# Patient Record
Sex: Male | Born: 1950 | Race: White | Hispanic: No | Marital: Married | Smoking: Current every day smoker
Health system: Southern US, Community
[De-identification: ages and names within clinical notes are randomized; demographics above are authoritative.]

## PROBLEM LIST (undated history)

## (undated) DIAGNOSIS — E559 Vitamin D deficiency, unspecified: Secondary | ICD-10-CM

## (undated) DIAGNOSIS — J439 Emphysema, unspecified: Secondary | ICD-10-CM

## (undated) DIAGNOSIS — K219 Gastro-esophageal reflux disease without esophagitis: Secondary | ICD-10-CM

## (undated) DIAGNOSIS — R0600 Dyspnea, unspecified: Secondary | ICD-10-CM

## (undated) DIAGNOSIS — K635 Polyp of colon: Secondary | ICD-10-CM

## (undated) DIAGNOSIS — G4733 Obstructive sleep apnea (adult) (pediatric): Secondary | ICD-10-CM

## (undated) DIAGNOSIS — C61 Malignant neoplasm of prostate: Secondary | ICD-10-CM

## (undated) DIAGNOSIS — M1990 Unspecified osteoarthritis, unspecified site: Secondary | ICD-10-CM

## (undated) DIAGNOSIS — E785 Hyperlipidemia, unspecified: Secondary | ICD-10-CM

## (undated) DIAGNOSIS — M109 Gout, unspecified: Secondary | ICD-10-CM

## (undated) DIAGNOSIS — I10 Essential (primary) hypertension: Secondary | ICD-10-CM

## (undated) DIAGNOSIS — IMO0002 Reserved for concepts with insufficient information to code with codable children: Secondary | ICD-10-CM

## (undated) DIAGNOSIS — H269 Unspecified cataract: Secondary | ICD-10-CM

## (undated) DIAGNOSIS — D649 Anemia, unspecified: Secondary | ICD-10-CM

## (undated) DIAGNOSIS — G4730 Sleep apnea, unspecified: Secondary | ICD-10-CM

## (undated) DIAGNOSIS — N529 Male erectile dysfunction, unspecified: Secondary | ICD-10-CM

## (undated) HISTORY — DX: Obstructive sleep apnea (adult) (pediatric): G47.33

## (undated) HISTORY — PX: OTHER SURGICAL HISTORY: SHX169

## (undated) HISTORY — DX: Reserved for concepts with insufficient information to code with codable children: IMO0002

## (undated) HISTORY — DX: Emphysema, unspecified: J43.9

## (undated) HISTORY — DX: Polyp of colon: K63.5

## (undated) HISTORY — PX: SKIN CANCER EXCISION: SHX779

## (undated) HISTORY — DX: Sleep apnea, unspecified: G47.30

## (undated) HISTORY — DX: Vitamin D deficiency, unspecified: E55.9

## (undated) HISTORY — PX: EYE SURGERY: SHX253

## (undated) HISTORY — DX: Essential (primary) hypertension: I10

## (undated) HISTORY — DX: Hyperlipidemia, unspecified: E78.5

## (undated) HISTORY — DX: Male erectile dysfunction, unspecified: N52.9

## (undated) HISTORY — DX: Gout, unspecified: M10.9

## (undated) HISTORY — DX: Malignant neoplasm of prostate: C61

## (undated) HISTORY — DX: Unspecified cataract: H26.9

## (undated) HISTORY — DX: Anemia, unspecified: D64.9

## (undated) HISTORY — DX: Unspecified osteoarthritis, unspecified site: M19.90

---

## 2001-11-29 HISTORY — PX: PROSTATECTOMY: SHX69

## 2001-12-18 ENCOUNTER — Encounter (INDEPENDENT_AMBULATORY_CARE_PROVIDER_SITE_OTHER): Payer: Self-pay

## 2001-12-18 ENCOUNTER — Ambulatory Visit (HOSPITAL_COMMUNITY): Admission: RE | Admit: 2001-12-18 | Discharge: 2001-12-18 | Payer: Self-pay | Admitting: Gastroenterology

## 2002-01-27 DIAGNOSIS — C61 Malignant neoplasm of prostate: Secondary | ICD-10-CM

## 2002-01-27 HISTORY — DX: Malignant neoplasm of prostate: C61

## 2002-02-05 ENCOUNTER — Inpatient Hospital Stay (HOSPITAL_COMMUNITY): Admission: RE | Admit: 2002-02-05 | Discharge: 2002-02-07 | Payer: Self-pay | Admitting: Urology

## 2002-02-05 ENCOUNTER — Encounter (INDEPENDENT_AMBULATORY_CARE_PROVIDER_SITE_OTHER): Payer: Self-pay | Admitting: Specialist

## 2002-03-08 ENCOUNTER — Encounter: Payer: Self-pay | Admitting: Urology

## 2002-03-08 ENCOUNTER — Encounter: Admission: RE | Admit: 2002-03-08 | Discharge: 2002-03-08 | Payer: Self-pay | Admitting: Urology

## 2006-05-31 ENCOUNTER — Encounter: Admission: RE | Admit: 2006-05-31 | Discharge: 2006-05-31 | Payer: Self-pay | Admitting: Internal Medicine

## 2009-05-05 ENCOUNTER — Ambulatory Visit (HOSPITAL_COMMUNITY): Admission: RE | Admit: 2009-05-05 | Discharge: 2009-05-05 | Payer: Self-pay | Admitting: Internal Medicine

## 2009-05-12 ENCOUNTER — Ambulatory Visit (HOSPITAL_COMMUNITY): Admission: RE | Admit: 2009-05-12 | Discharge: 2009-05-12 | Payer: Self-pay | Admitting: Internal Medicine

## 2009-08-05 ENCOUNTER — Ambulatory Visit (HOSPITAL_COMMUNITY): Admission: RE | Admit: 2009-08-05 | Discharge: 2009-08-05 | Payer: Self-pay | Admitting: Internal Medicine

## 2010-05-05 ENCOUNTER — Ambulatory Visit (HOSPITAL_COMMUNITY): Admission: RE | Admit: 2010-05-05 | Discharge: 2010-05-05 | Payer: Self-pay | Admitting: Internal Medicine

## 2011-02-09 ENCOUNTER — Other Ambulatory Visit: Payer: Self-pay | Admitting: *Deleted

## 2011-02-09 DIAGNOSIS — M542 Cervicalgia: Secondary | ICD-10-CM

## 2011-02-10 ENCOUNTER — Other Ambulatory Visit: Payer: Self-pay | Admitting: *Deleted

## 2011-02-10 DIAGNOSIS — Z139 Encounter for screening, unspecified: Secondary | ICD-10-CM

## 2011-02-13 ENCOUNTER — Other Ambulatory Visit: Payer: Self-pay

## 2011-02-15 ENCOUNTER — Other Ambulatory Visit: Payer: Self-pay

## 2011-02-25 ENCOUNTER — Other Ambulatory Visit: Payer: Self-pay | Admitting: Internal Medicine

## 2011-03-18 ENCOUNTER — Other Ambulatory Visit: Payer: Self-pay | Admitting: Internal Medicine

## 2011-04-16 NOTE — H&P (Signed)
[facility]  Patient:    Stephen Holland, Stephen Holland Visit Number: [id number] MRN: [id number]          Service Type: SUR Location: 1S X004 01 Attending Physician:  Liborio Nixon Dictated by:   Bertram Millard. Dahlstedt, M.D. Admit Date:  02/05/2002   CC:         Illa Level, M.D.   History and Physical  REASON FOR ADMISSION:  Prostate cancer.  HISTORY OF PRESENT ILLNESS:  This 60 year old male has biopsy-proven adenocarcinoma of the prostate.  He initially presented in late 2002 with an elevated PSA of 4.8.  He underwent ultrasound of the prostate which measured 28 cc in size.  Ten biopsies were taken.  Biopsy results revealed adenocarcinoma of the prostate bilaterally, Gleason 7/10 (3 + 4), 40% of the tissue on the right, 20% of the tissue on the left was adenocarcinoma.  The patient has chosen to have surgery.  Risks of complications of this procedure as well as the alternatives of radiation and watchful waiting are known to the patient.  He presents at this time for that surgery.  PAST MEDICAL HISTORY: 1. Hypertension. 2. Tobacco abuse. 3. History of VD in the past. 4. Hypercholesterolemia. 5. ______.  PAST SURGICAL HISTORY:  Wisdom tooth extraction.  MEDICATIONS: 1. Cardura 8 mg a day. 2. Toprol 100 mg a day. 3. Zocor 80 mg a day. 4. Viagra 50 mg p.o. p.r.n.  ALLERGIES:  No known drug allergies.  SOCIAL HISTORY:  The patient is married and has two children.  He works full-time for [employer].  He is a native of [place].  He recently stopped smoking.  He has a 40-pack-year history of use before that. He did drink a fair amount of alcohol, up to a fifth a week.  He has never had DTs.  FAMILY HISTORY, REVIEW OF SYSTEMS:  Unremarkable.  PHYSICAL EXAMINATION:  GENERAL:  Healthy-appearing, pleasant adult male.  VITAL SIGNS:  Blood pressure 140/80, pulse 88, respiratory rate 18, afebrile.  HEENT:  Normal.  NECK:   Supple.  Without thyromegaly or adenopathy.  CHEST:  Clear bilaterally.  HEART:  Regular rate and rhythm.  Without rub, gallop, or murmur.  ABDOMEN:  Protuberant, soft, nontender, nondistended.  No hepatosplenomegaly or masses noted.  GENITOURINARY:  Phallus circumcised.  Without lesions, fibrotic areas, or plaques.  Glans and meatus normal.  RECTAL:  Normal anal sphincter tone, 2+ gland without tenderness or nodularity.  EXTREMITIES:  Without cyanosis, clubbing, or edema.  NEUROLOGIC:  Grossly intact.  LABORATORY DATA:  EKG normal.  Chest x-ray normal.  Admission studies normal.  IMPRESSION: 1. Prostatic carcinoma. 2. Hypertension. 3. ______. 4. Hypercholesterolemia.  PLAN:  Radical prostatectomy.Dictated by:   Bertram Millard. Dahlstedt, M.D. Attending Physician:  Liborio Nixon DD:  02/05/02 TD:  02/05/02 Job: [id number]

## 2011-04-16 NOTE — Op Note (Signed)
[facility]  Patient:    Stephen Holland, Stephen Holland Visit Number: [id number] MRN: [id number]          Service Type: SUR Location: 3W 0355 01 Attending Physician:  Liborio Nixon Dictated by:   Bertram Millard. Dahlstedt, M.D. Proc. Date: 02/05/02 Admit Date:  02/05/2002   CC:         Chales Salmon. Abigail Miyamoto, M.D.   Operative Report  PREOPERATIVE DIAGNOSIS:  Adenocarcinoma of the prostate, clinical stage T1C and XMX, Gleason 7/10 (3+4).  POSTOPERATIVE DIAGNOSIS:  Adenocarcinoma of the prostate, clinical stage T1C and XMX, Gleason 7/10 (3+4).  OPERATION PERFORMED:  Fluoroscopy pelvic lymph node dissection, radical retropubic prostatectomy.  SURGEON:  Bertram Millard. Retta Diones, M.D.  ASSISTANT:  Rozanna Boer., M.D.  COMPLICATIONS:  None.  ESTIMATED BLOOD LOSS:  2000 cc.  FLUID REPLACEMENT:  4000 cc crystalloid, 1000 cc colloid.  ANESTHESIA:  General endotracheal.  INDICATIONS FOR PROCEDURE:  The patient is a 60 year old male with adenocarcinoma of the prostate, clinical stage T1C and MXM, Gleason 7/10.  He presented with an elevated PSA of 4.8.  He had bilaterally positive biopsies but 40% of the biopsies on the right being positive for cancer, 20% on the left. He has chosen to have surgery.  The risks and complications of the procedure were discussed with the patient and his wife in depth.  They include but are not limited to bleeding, infection, need for transfusion, rectal injury, pulmonary embolus, deep vein thrombosis, cardiovascular events, impotence and incontinence.  They have been instructed in the alternatives to surgery as well.  He presents at this time for that surgery.  DESCRIPTION OF PROCEDURE:  The patient was administered a general endotracheal anesthetic and placed in the supine position.  A sacral roll was placed.  The lower abdomen and genitalia were prepped and draped.  An incision was made from the pubis just inferior to  the umbilicus and carried through the tissues with electrocautery.  The Buckwalter retractor was placed.  Bilateral pelvic lymph node dissections were then performed with the usual templates of dissection.  Lymph node packets were sent as right and left pelvic nodes, respectively.  Endopelvic fascia was divided bilaterally.  Pubis to prostatic ligaments were divided.  The urethra was identified.  At this point there was some bleeding at the membranous urethra.  Sutures were placed of chromic in this area.  The dorsal vein complex was doubly ligated inferiorly with #1 Vicryl ties and a 0 Vicryl suture ligature.  Back bleeder stitch was placed to the bladder neck with a 0 Vicryl suture ligature.  The bladder neck was then dissected off the base of the prostate.  Once this was carried out, the urethra was divided at the bladder neck.  There was a wide open bladder neck at this point.  Dissection was then carried superiorly and laterally to dissect the posterior bladder neck attachments to the prostate and also the vascular pedicles to the prostate.  These were clipped and divided.  The vas deferentia and seminal vesicles were ligated and divided separately.  This allowed the dissection to carry on behind the prostate and in front of the rectum.  The rectum was gently teased off the posterior aspect of the prostate.  The remaining vascular pedicles of the prostate were divided after they were ligated.  Dissection was then carried to the apex of the prostate and the urethra was divided and the apex of the prostate dissected off the rectum.  The prostate  was then sent to pathology.  Small hemorrhoidal  vessels and pelvic bleeders were then cauterized or clipped.  Hemostasis was adequate. Sutures were placed for the anastomosis at the 12 oclock, 2 oclock, 5 oclock, 7 oclock and 10 oclock positions on the urethra.  The catheter was then placed through the urethra.  Indigo carmine had been given and  the ureteral orifices were identified.  The bladder neck was imbricated using interrupted 2-0 chromics.  The bladder neck mucosa was everted using 4-0 chromics placed circumferentially.  Care was taken to avoid injury to the ureteral orifices.  The catheter was then placed within the bladder neck and the catheter was filled with 15 cc of saline.  The catheter was grasped anteriorly with the Babcock clamp through the bladder wall.  The anastomotic sutures which had already been placed in the urethra were placed through corresponding areas of bladder neck.  Superior traction was released on the bladder and the anastomosis sutures were tied and cut.  The bladder was irrigated.  No clots were in it and there was no extravasation through the anastomosis.  A drain was placed in the pelvis through a separate stab incision  in the left lower quadrant.  This was sutured to the skin using 3-0 nylon.  The fascia was reapproximated using a #1 running PDS.  Skin edges were reapproximated using a running 4-0 Vicryl.  Benzoin and Steri-Strips were placed.  A dry sterile dressing was placed.  The patient tolerated the procedure well.  Sponge, needle and instrument counts were correct times two. He was awakened, extubated and taken to post anesthesia care unit in stable condition. Dictated by:   Bertram Millard. Dahlstedt, M.D. Attending Physician:  Liborio Nixon DD:  02/05/02 TD:  02/05/02 Job: [id number]

## 2011-04-16 NOTE — Discharge Summary (Signed)
[facility]  Patient:    Stephen Holland, Stephen Holland Visit Number: [id number] MRN: [id number]          Service Type: SUR Location: 3W 0355 01 Attending Physician:  Liborio Nixon Dictated by:   Bertram Millard. Dahlstedt, M.D. Admit Date:  02/05/2002 Discharge Date: 02/07/2002   CC:         Chales Salmon. Abigail Miyamoto, M.D.   Discharge Summary  DIAGNOSIS:  Prostate cancer.  PRINCIPAL PROCEDURE:  February 05, 2002, bilateral pelvic lymph node dissection, radical retropubic prostatectomy.  HISTORY OF PRESENT ILLNESS:  A 60 year old male with biopsy-proven adenocarcinoma of the prostate.  His prior medical history includes hypertension, history of tobacco abuse, history of hypercholesterolemia.  MEDICATIONS ON ADMISSION: 1. Cardura 8 mg a day. 2. Toprol 100 mg a day. 3. Zocor 80 mg a day. 4. ______ 50 mg p.o. p.r.n.  HOSPITAL COURSE:  Patient was admitted to my service and underwent radical retropubic prostatectomy, bilateral pelvic lymph node dissection.  He tolerated the procedure well.  He did have a fair amount of blood loss during the procedure.  No blood products were given intra or postoperatively.  By the time of discharge, postoperative day #2, his hematocrit was 23.7%.  He was eating well, having bowel movements.  Incision was well healed.  He remained afebrile postoperatively except for one slightly elevated temperature of 100.4.  He ambulated.  By postoperative morning #2, he was felt to be in adequate condition for discharge.  He was discharged in improved condition.  DISCHARGE MEDICATIONS:  His usual home medications. 1. Cardura 8 mg a day. 2. Toprol 100 mg a day. 3. Zocor 80 mg a day. 4. Vicodin ES one to two p.o. q.4h. p.r.n. pain., #25 with one refill. 5. Bactrim DS one p.o. b.i.d., #15. 6. Ditropan 5 mg one p.o. q.8h. p.r.n. bladder spasms, #30.  FOLLOW-UP:  Will follow up in seven days in my office for catheter removal.  Discharge  instructions and limitations were discussed with the patient and were written on the discharge instruction sheet. Dictated by:   Bertram Millard. Dahlstedt, M.D. Attending Physician:  Liborio Nixon DD:  02/07/02 TD:  02/08/02 Job: [id number]

## 2011-04-16 NOTE — Op Note (Signed)
[facility]  Patient:    Stephen Holland, Stephen Holland Visit Number: [id number] MRN: [id number]          Service Type: Attending:  Verlin Grills, M.D. Dictated by:   Verlin Grills, M.D. Proc. Date: 12/18/01   CC:         Chales Salmon. Abigail Miyamoto, M.D.   Operative Report  PROCEDURE:  Colonoscopy and polypectomy.  REFERRING PHYSICIAN:  Chales Salmon. Abigail Miyamoto, M.D.  PROCEDURE INDICATION:  [name] is a 60 year old male born 1951/04/06. He has a history of neoplastic polyp removed colonoscopically. His last colonoscopy in 1998 resulted in the removal of two 2-mm neoplastic polyps.  I discussed with Mr. Khawaja the complications associated with screening colonoscopy and polypectomy, including intestinal bleeding and intestinal perforation. Mr. Fotheringham has signed the operative permit.  ENDOSCOPIST:  Verlin Grills, M.D.  PREMEDICATION:  Versed 7.5 mg, Demerol 100 mg.  ENDOSCOPE:  Olympus Pediatric Colonoscope.  DESCRIPTION OF PROCEDURE:  After obtaining informed consent, Mr. Gilreath was placed in the left lateral decubitus position. I administered intravenous Demerol and intravenous Versed to achieve conscious sedation for the procedure. The patients blood pressure, oxygen saturation, and cardiac rhythm were monitored throughout the procedure and documented in the medical record.  Anal inspection was normal. Digital rectal exam revealed an enlarged prostate. The rectal mucosa overlying the prostate felt somewhat rough to palpation. The Olympus Pediatric video colonoscope was introduced into the rectum and easily advanced to the cecum. Colonic preparation for the exam today was excellent.  Rectum:  The mucosa overlying the prostate in the distal rectum appeared unremarkable. The mucosa was lifted with saline and a snare biopsy of this mucosa was obtained to be sent for pathological evaluation to rule out neoplastic tissue.  Sigmoid colon  and descending colon:  At 40 cm from the anal verge, a 2-mm sessile polyp was removed with the electrocautery snare and submitted for pathological interpretation.  Splenic flexure:  Normal.  Transverse colon:  Normal.  Hepatic flexure:  Normal.  Ascending colon:  Normal.  Cecum and ileocecal valve:  Normal.  ASSESSMENT: 1. From the distal rectum, at 40 cm from the anal verge, a 2-mm sessile polyp    was removed with electrocautery snare. 2. From the distal rectum, mucosa overlying the prostate was lifted with    saline and a snare biopsy was taken to look for neoplastic tissue. Dictated by:   Verlin Grills, M.D. Attending:  Verlin Grills, M.D. DD:  12/18/01 TD:  12/19/01 Job: [id number]

## 2011-05-10 ENCOUNTER — Other Ambulatory Visit (HOSPITAL_COMMUNITY): Payer: Self-pay | Admitting: Internal Medicine

## 2011-05-10 ENCOUNTER — Ambulatory Visit (HOSPITAL_COMMUNITY)
Admission: RE | Admit: 2011-05-10 | Discharge: 2011-05-10 | Disposition: A | Discharge status: Home / Self Care | Payer: Managed Care, Other (non HMO) | Source: Ambulatory Visit | Attending: Internal Medicine | Admitting: Internal Medicine

## 2011-05-10 DIAGNOSIS — I10 Essential (primary) hypertension: Secondary | ICD-10-CM | POA: Insufficient documentation

## 2012-05-15 ENCOUNTER — Other Ambulatory Visit (HOSPITAL_COMMUNITY): Payer: Self-pay | Admitting: Internal Medicine

## 2012-05-15 ENCOUNTER — Ambulatory Visit (HOSPITAL_COMMUNITY)
Admission: RE | Admit: 2012-05-15 | Discharge: 2012-05-15 | Disposition: A | Discharge status: Home / Self Care | Payer: Managed Care, Other (non HMO) | Source: Ambulatory Visit | Attending: Internal Medicine | Admitting: Internal Medicine

## 2012-05-15 DIAGNOSIS — J449 Chronic obstructive pulmonary disease, unspecified: Secondary | ICD-10-CM | POA: Insufficient documentation

## 2012-05-15 DIAGNOSIS — I10 Essential (primary) hypertension: Secondary | ICD-10-CM | POA: Insufficient documentation

## 2012-05-15 DIAGNOSIS — F17200 Nicotine dependence, unspecified, uncomplicated: Secondary | ICD-10-CM | POA: Insufficient documentation

## 2012-05-15 DIAGNOSIS — J4489 Other specified chronic obstructive pulmonary disease: Secondary | ICD-10-CM | POA: Insufficient documentation

## 2013-05-17 ENCOUNTER — Ambulatory Visit (HOSPITAL_COMMUNITY)
Admission: RE | Admit: 2013-05-17 | Discharge: 2013-05-17 | Disposition: A | Discharge status: Home / Self Care | Payer: Managed Care, Other (non HMO) | Source: Ambulatory Visit | Attending: Internal Medicine | Admitting: Internal Medicine

## 2013-05-17 ENCOUNTER — Other Ambulatory Visit (HOSPITAL_COMMUNITY): Payer: Self-pay | Admitting: Internal Medicine

## 2013-05-17 DIAGNOSIS — F17200 Nicotine dependence, unspecified, uncomplicated: Secondary | ICD-10-CM | POA: Insufficient documentation

## 2013-05-17 DIAGNOSIS — J4489 Other specified chronic obstructive pulmonary disease: Secondary | ICD-10-CM | POA: Insufficient documentation

## 2013-05-17 DIAGNOSIS — I10 Essential (primary) hypertension: Secondary | ICD-10-CM

## 2013-05-17 DIAGNOSIS — J449 Chronic obstructive pulmonary disease, unspecified: Secondary | ICD-10-CM | POA: Insufficient documentation

## 2013-10-03 ENCOUNTER — Encounter: Payer: Self-pay | Admitting: Physician Assistant

## 2013-10-03 ENCOUNTER — Ambulatory Visit: Payer: Managed Care, Other (non HMO) | Admitting: Physician Assistant

## 2013-10-03 ENCOUNTER — Other Ambulatory Visit: Payer: Self-pay | Admitting: Internal Medicine

## 2013-10-03 ENCOUNTER — Ambulatory Visit (HOSPITAL_COMMUNITY)
Admission: RE | Admit: 2013-10-03 | Discharge: 2013-10-03 | Disposition: A | Discharge status: Home / Self Care | Payer: Managed Care, Other (non HMO) | Source: Ambulatory Visit | Attending: Internal Medicine | Admitting: Internal Medicine

## 2013-10-03 VITALS — BP 148/88 | HR 88 | Temp 98.1°F | Resp 16

## 2013-10-03 DIAGNOSIS — M545 Low back pain, unspecified: Secondary | ICD-10-CM

## 2013-10-03 DIAGNOSIS — E559 Vitamin D deficiency, unspecified: Secondary | ICD-10-CM | POA: Insufficient documentation

## 2013-10-03 DIAGNOSIS — M25559 Pain in unspecified hip: Secondary | ICD-10-CM | POA: Insufficient documentation

## 2013-10-03 DIAGNOSIS — E782 Mixed hyperlipidemia: Secondary | ICD-10-CM | POA: Insufficient documentation

## 2013-10-03 DIAGNOSIS — M1000 Idiopathic gout, unspecified site: Secondary | ICD-10-CM | POA: Insufficient documentation

## 2013-10-03 DIAGNOSIS — R7309 Other abnormal glucose: Secondary | ICD-10-CM | POA: Insufficient documentation

## 2013-10-03 DIAGNOSIS — N529 Male erectile dysfunction, unspecified: Secondary | ICD-10-CM | POA: Insufficient documentation

## 2013-10-03 DIAGNOSIS — M5441 Lumbago with sciatica, right side: Secondary | ICD-10-CM

## 2013-10-03 DIAGNOSIS — M1990 Unspecified osteoarthritis, unspecified site: Secondary | ICD-10-CM | POA: Insufficient documentation

## 2013-10-03 DIAGNOSIS — I700 Atherosclerosis of aorta: Secondary | ICD-10-CM | POA: Insufficient documentation

## 2013-10-03 DIAGNOSIS — G4733 Obstructive sleep apnea (adult) (pediatric): Secondary | ICD-10-CM | POA: Insufficient documentation

## 2013-10-03 DIAGNOSIS — J449 Chronic obstructive pulmonary disease, unspecified: Secondary | ICD-10-CM | POA: Insufficient documentation

## 2013-10-03 DIAGNOSIS — I10 Essential (primary) hypertension: Secondary | ICD-10-CM | POA: Insufficient documentation

## 2013-10-03 DIAGNOSIS — M79609 Pain in unspecified limb: Secondary | ICD-10-CM | POA: Insufficient documentation

## 2013-10-03 NOTE — Progress Notes (Deleted)
Patient ID: Stephen Holland, male   DOB: 05-13-1951, 62 y.o.   MRN: [id number]

## 2013-10-03 NOTE — Progress Notes (Signed)
Subjective:    Patient ID: Stephen Holland, male    DOB: Sep 03, 1951, 62 y.o.   MRN: [id number]  Back Pain This is a new problem. The current episode started in the past 7 days. The problem occurs constantly. The problem has been gradually worsening since onset. The pain is present in the lumbar spine. The quality of the pain is described as shooting and stabbing. The pain radiates to the right foot and right knee. The pain is at a severity of 8/10. The pain is moderate. The pain is the same all the time. The symptoms are aggravated by sitting, standing and position. Stiffness is present all day. Associated symptoms include leg pain, numbness and paresthesias. Pertinent negatives include no abdominal pain, bladder incontinence, bowel incontinence, dysuria, fever, headaches, pelvic pain, tingling, weakness or weight loss. Risk factors include history of cancer and obesity. He has tried analgesics, heat and NSAIDs for the symptoms. The treatment provided no relief.   Past Medical History  Diagnosis Date  . Hypertension   . Hyperlipemia   . Prediabetes   . Obstructive sleep apnea     08/2012  . Vitamin D deficiency   . DJD (degenerative joint disease)   . Gout   . Prostate cancer     s/p prostatectomy  . ED (erectile dysfunction)   . Colon polyps    Current outpatient prescriptions:allopurinol (ZYLOPRIM) 300 MG tablet, Take 300 mg by mouth daily., Disp: , Rfl: ;  aspirin 81 MG tablet, Take 81 mg by mouth daily., Disp: , Rfl: ;  bisoprolol-hydrochlorothiazide (ZIAC) 10-6.25 MG per tablet, Take 1 tablet by mouth daily., Disp: , Rfl: ;  cholecalciferol (VITAMIN D) 1000 UNITS tablet, Take 10,000 Units by mouth daily., Disp: , Rfl:  clonazePAM (KLONOPIN) 1 MG tablet, Take 1 mg by mouth at bedtime., Disp: , Rfl: ;  hydrochlorothiazide (HYDRODIURIL) 25 MG tablet, Take 25 mg by mouth daily., Disp: , Rfl: ;  Krill Oil 1000 MG CAPS, Take by mouth., Disp: , Rfl: ;  losartan (COZAAR) 100 MG tablet, Take 100 mg  by mouth daily., Disp: , Rfl: ;  Magnesium 400 MG CAPS, Take by mouth 2 (two) times daily., Disp: , Rfl:  meloxicam (MOBIC) 15 MG tablet, Take 15 mg by mouth daily., Disp: , Rfl: ;  rosuvastatin (CRESTOR) 40 MG tablet, Take 40 mg by mouth daily. 1/2 pill, Disp: , Rfl:    Review of Systems  Constitutional: Negative.  Negative for fever and weight loss.  Respiratory: Negative.   Cardiovascular: Negative.   Gastrointestinal: Negative for abdominal pain and bowel incontinence.  Genitourinary: Negative.  Negative for bladder incontinence, dysuria and pelvic pain.  Musculoskeletal: Positive for back pain and gait problem. Negative for joint swelling, myalgias, neck pain and neck stiffness.  Neurological: Positive for numbness and paresthesias. Negative for tingling, weakness and headaches.       Objective:   Physical Exam  Constitutional: He is oriented to person, place, and time. He appears well-developed and well-nourished.  Neck: Normal range of motion. Neck supple.  Cardiovascular: Normal rate, regular rhythm, normal heart sounds and intact distal pulses.   Pulmonary/Chest: Effort normal and breath sounds normal.  Abdominal: Soft. Bowel sounds are normal. There is no tenderness.  Musculoskeletal:       Lumbar back: He exhibits tenderness.       Back:  Neurological: He is alert and oriented to person, place, and time. He displays abnormal reflex. No cranial nerve deficit.  Reflex Scores:  Patellar reflexes are 1+ on the right side.      Achilles reflexes are 1+ on the right side.     Assessment & Plan:   LBP with radiation in a patient with history of prostate CA. - no constitutional symptoms but will get an Lumbar xray to rule out mets Prednisone 20- take as directed and do not take any NSAIDS with it.  Norco 5/325 number 60 with no refills.  May need to send to ortho for referal.

## 2013-10-05 ENCOUNTER — Telehealth: Payer: Self-pay | Admitting: Physician Assistant

## 2013-10-05 DIAGNOSIS — M25551 Pain in right hip: Secondary | ICD-10-CM

## 2013-10-05 NOTE — Telephone Encounter (Signed)
Message copied by Linwood Dibbles on Fri Oct 05, 2013 12:03 PM ------      Message from: Quentin Mulling R      Created: Fri Oct 05, 2013 11:48 AM       Xray does not show a reason for the hip pain but it only shows bony abnormalities. We will refer you to ortho so they can get a MRI/evaluate you ------

## 2013-10-05 NOTE — Telephone Encounter (Signed)
Pt saw amanda yesterday=hip pain=but it seems to be getting worst.= wants advice=also wants xray results of his hip. Please call pt back.  Pharm pt use pharm is [facility] [address] in [place].

## 2013-10-05 NOTE — Telephone Encounter (Signed)
Message copied by Linwood Dibbles on Fri Oct 05, 2013 12:05 PM ------      Message from: Quentin Mulling R      Created: Fri Oct 05, 2013 11:48 AM       Xray does not show a reason for the hip pain but it only shows bony abnormalities. We will refer you to ortho so they can get a MRI/evaluate you ------

## 2013-10-05 NOTE — Telephone Encounter (Signed)
Patient aware of xray results, advised that a referral has been placed for him to Ortho ( [facility]) and he will be contacted with referral information

## 2013-10-10 ENCOUNTER — Other Ambulatory Visit: Payer: Self-pay | Admitting: Internal Medicine

## 2013-10-10 MED ORDER — ROSUVASTATIN CALCIUM 40 MG PO TABS: 40.0000 mg | ORAL_TABLET | Freq: Every day | ORAL | Status: DC

## 2013-10-15 ENCOUNTER — Encounter: Payer: Self-pay | Admitting: Internal Medicine

## 2013-10-15 NOTE — Patient Instructions (Signed)
Continue diet & medications same. As discussed.   Further disposition pending lab results.   Hypertension As your heart beats, it forces blood through your arteries. This force is your blood pressure. If the pressure is too high, it is called hypertension (HTN) or high blood pressure. HTN is dangerous because you may have it and not know it. High blood pressure may mean that your heart has to work harder to pump blood. Your arteries may be narrow or stiff. The extra work puts you at risk for heart disease, stroke, and other problems.  Blood pressure consists of two numbers, a higher number over a lower, 110/72, for example. It is stated as "110 over 72." The ideal is below 120 for the top number (systolic) and under 80 for the bottom (diastolic). Write down your blood pressure today. You should pay close attention to your blood pressure if you have certain conditions such as:  Heart failure.  Prior heart attack.  Diabetes  Chronic kidney disease.  Prior stroke.  Multiple risk factors for heart disease. To see if you have HTN, your blood pressure should be measured while you are seated with your arm held at the level of the heart. It should be measured at least twice. A one-time elevated blood pressure reading (especially in the Emergency Department) does not mean that you need treatment. There may be conditions in which the blood pressure is different between your right and left arms. It is important to see your caregiver soon for a recheck. Most people have essential hypertension which means that there is not a specific cause. This type of high blood pressure may be lowered by changing lifestyle factors such as:  Stress.  Smoking.  Lack of exercise.  Excessive weight.  Drug/tobacco/alcohol use.  Eating less salt. Most people do not have symptoms from high blood pressure until it has caused damage to the body. Effective treatment can often prevent, delay or reduce that  damage. TREATMENT  When a cause has been identified, treatment for high blood pressure is directed at the cause. There are a large number of medications to treat HTN. These fall into several categories, and your caregiver will help you select the medicines that are best for you. Medications may have side effects. You should review side effects with your caregiver. If your blood pressure stays high after you have made lifestyle changes or started on medicines,   Your medication(s) may need to be changed.  Other problems may need to be addressed.  Be certain you understand your prescriptions, and know how and when to take your medicine.  Be sure to follow up with your caregiver within the time frame advised (usually within two weeks) to have your blood pressure rechecked and to review your medications.  If you are taking more than one medicine to lower your blood pressure, make sure you know how and at what times they should be taken. Taking two medicines at the same time can result in blood pressure that is too low. SEEK IMMEDIATE MEDICAL CARE IF:  You develop a severe headache, blurred or changing vision, or confusion.  You have unusual weakness or numbness, or a faint feeling.  You have severe chest or abdominal pain, vomiting, or breathing problems. MAKE SURE YOU:   Understand these instructions.  Will watch your condition.  Will get help right away if you are not doing well or get worse. Document Released: 11/15/2005 Document Revised: 02/07/2012 Document Reviewed: 07/05/2008 [employer] Patient Information 2014 [place]   Type 1 Diabetes Mellitus, Adult Type 1 diabetes mellitus, often simply referred to as diabetes, is a long-term (chronic) disease. It occurs when the islet cells in the pancreas that make insulin (a hormone) are destroyed and can no longer make insulin. Insulin is needed to move sugars from food into the tissue cells. The tissue cells use the sugars for  energy. In people with type 1 diabetes, the sugars build up in the blood instead of going into the tissue cells. As a result, high blood sugar (hyperglycemia) develops. Without insulin, the body breaks down fat cells for the needed energy. This breakdown of fat cells produces acid chemicals (ketones), which increases the acid levels in the body. The effect of either high ketone or sugar (glucose) levels can be life-threatening.  Type 1 diabetes was also previously called juvenile diabetes. It most often occurs before the age of 30, but it can occur at any age. RISK FACTORS A person is predisposed to developing type 1 diabetes if someone in his or her family has the disease and is exposed to certain additional environmental triggers.  SYMPTOMS  Symptoms of type 1 diabetes may develop gradually over days to weeks or suddenly. The symptoms occur due to hyperglycemia. The symptoms can include:   Increased thirst (polydipsia).  Increased urination (polyuria).  Increased urination during the night (nocturia).  Weight loss. This weight loss may be rapid.  Frequent, recurring infections.  Tiredness (fatigue).  Weakness.  Vision changes, such as blurred vision.  Fruity smell to your breath.  Abdominal pain.  Nausea or vomiting. DIAGNOSIS  Type 1 diabetes is diagnosed when symptoms of diabetes are present and when blood glucose levels are increased. Your blood glucose level may be checked by one or more of the following blood tests:  A fasting blood glucose test. You will not be allowed to eat for at least 8 hours before a blood sample is taken.  A random blood glucose test. Your blood glucose is checked at any time of the day regardless of when you ate.  A hemoglobin A1c blood glucose test. A hemoglobin A1c test provides information about blood glucose control over the previous 3 months. TREATMENT  Although type 1 diabetes cannot be prevented, it can be managed with insulin, diet, and  exercise.  You will need to take insulin daily to keep blood glucose in the desired range.  You will need to match insulin dosing with exercise and healthy food choices. The treatment goal is to maintain the before-meal blood sugar (preprandial glucose) level at 70 130 mg/dL.  HOME CARE INSTRUCTIONS   Have your hemoglobin A1c level checked twice a year.  Perform daily blood glucose monitoring as directed by your caregiver.  Monitor urine ketones when you are ill and as directed by your caregiver.  Take your insulin as directed by your caregiver to maintain your blood glucose level in the desired range.  Never run out of insulin. It is needed every day.  Adjust insulin based on your intake of carbohydrates. Carbohydrates can raise blood glucose levels but need to be included in your diet. Carbohydrates provide vitamins, minerals, and fiber, which are an essential part of a healthy diet. Carbohydrates are found in fruits, vegetables, whole grains, dairy products, legumes, and foods containing added sugars.    Eat healthy foods. Alternate 3 meals with 3 snacks.  Maintain a healthy weight.  Carry a medical alert card or wear your medical alert jewelry.  Carry a 15 gram  carbohydrate snack with you at all times to treat low blood glucose (hypoglycemia). Some examples of 15 gram carbohydrate snacks include:  Glucose tablets, 3 or 4.   Glucose gel, 15 gram tube.  Raisins, 2 tablespoons (24 grams).  Jelly beans, 6.  Animal crackers, 8.  Fruit juice, regular soda, or low-fat milk, 4 ounces (120 mL).  Gummy treats, 9.    Recognize hypoglycemia. Hypoglycemia occurs with blood glucose levels of 70 mg/dL and below. The risk for hypoglycemia increases when fasting or skipping meals, during or after intense exercise, and during sleep. Hypoglycemia symptoms can include:  Tremors or shakes.  Decreased ability to concentrate.  Sweating.  Increased heart rate.  Headache.  Dry  mouth.  Hunger.  Irritability.  Anxiety.  Restless sleep.  Altered speech or coordination.  Confusion.  Treat hypoglycemia promptly. If you are alert and able to safely swallow, follow the 15:15 rule:  Take 15 20 grams of rapid-acting glucose or carbohydrate. Rapid-acting options include glucose gel, glucose tablets, or 4 ounces (120 mL) of fruit juice, regular soda, or low-fat milk.  Check your blood glucose level 15 minutes after taking the glucose.   Take 15 20 grams more of glucose if the repeat blood glucose level is still 70 mg/dL or below.  Eat a meal or snack within 1 hour once blood glucose levels return to normal.  Be alert to polyuria and polydipsia, which are early signs of hyperglycemia. An early awareness of hyperglycemia allows for prompt treatment. Treat hyperglycemia as directed by your caregiver.  Engage in at least 150 minutes of moderate-intensity physical activity a week, spread over at least 3 days of the week or as directed by your caregiver.  Adjust your insulin dosing and food intake as needed if you start a new exercise or sport.  Follow your sick day plan at any time you are unable to eat or drink as usual.   Avoid tobacco use.  Limit alcohol intake to no more than 1 drink per day for nonpregnant women and 2 drinks per day for men. You should drink alcohol only when you are also eating food. Talk with your caregiver about whether alcohol is safe for you. Tell your caregiver if you drink alcohol several times a week.  Follow up with your caregiver regularly.  Schedule an eye exam within 5 years of diagnosis and then annually.  Perform daily skin and foot care. Examine your skin and feet daily for cuts, bruises, redness, nail problems, bleeding, blisters, or sores. A foot exam by a caregiver should be done annually.  Brush your teeth and gums at least twice a day and floss at least once a day. Follow up with your dentist regularly.  Share your  diabetes management plan with your workplace or school.  Stay up-to-date with immunizations.  Learn to manage stress.  Obtain ongoing diabetes education and support as needed.  Participate or seek rehabilitation as needed to maintain or improve independence and quality of life. Request a physical or occupational therapy referral if you are having foot or hand numbness or difficulties with grooming, dressing, eating, or physical activity. SEEK MEDICAL CARE IF:   You are unable to eat food or drink fluids for more than 6 hours.  You have nausea and vomiting for more than 6 hours.  Your blood glucose level is over 240 mg/dL.  There is a change in mental status.  You develop an additional serious illness.  You have diarrhea for more than 6 hours.  You have been sick or have had a fever for a couple of days and are not getting better.  You have pain during any physical activity. SEEK IMMEDIATE MEDICAL CARE IF:  You have difficulty breathing.  You have moderate to large ketone levels. MAKE SURE YOU:  Understand these instructions.  Will watch your condition.  Will get help right away if you are not doing well or get worse. Document Released: 11/12/2000 Document Revised: 08/09/2012 Document Reviewed: 06/13/2012 [facility] Patient Information 2014 [place], [place].   Vitamin D Deficiency Vitamin D is an important vitamin that your body needs. Having too little of it in your body is called a deficiency. A very bad deficiency can make your bones soft and can cause a condition called rickets.  Vitamin D is important to your body for different reasons, such as:   It helps your body absorb 2 minerals called calcium and phosphorus.  It helps make your bones healthy.  It may prevent some diseases, such as diabetes and multiple sclerosis.  It helps your muscles and heart. You can get vitamin D in several ways. It is a natural part of some foods. The vitamin is also added to some  dairy products and cereals. Some people take vitamin D supplements. Also, your body makes vitamin D when you are in the sun. It changes the sun's rays into a form of the vitamin that your body can use. CAUSES   Not eating enough foods that contain vitamin D.  Not getting enough sunlight.  Having certain digestive system diseases that make it hard to absorb vitamin D. These diseases include Crohn's disease, chronic pancreatitis, and cystic fibrosis.  Having a surgery in which part of the stomach or small intestine is removed.  Being obese. Fat cells pull vitamin D out of your blood. That means that obese people may not have enough vitamin D left in their blood and in other body tissues.  Having chronic kidney or liver disease. RISK FACTORS Risk factors are things that make you more likely to develop a vitamin D deficiency. They include:  Being older.  Not being able to get outside very much.  Living in a nursing home.  Having had broken bones.  Having weak or thin bones (osteoporosis).  Having a disease or condition that changes how your body absorbs vitamin D.  Having dark skin.  Some medicines such as seizure medicines or steroids.  Being overweight or obese. SYMPTOMS Mild cases of vitamin D deficiency may not have any symptoms. If you have a very bad case, symptoms may include:  Bone pain.  Muscle pain.  Falling often.  Broken bones caused by a minor injury, due to osteoporosis. DIAGNOSIS A blood test is the best way to tell if you have a vitamin D deficiency. TREATMENT Vitamin D deficiency can be treated in different ways. Treatment for vitamin D deficiency depends on what is causing it. Options include:  Taking vitamin D supplements.  Taking a calcium supplement. Your caregiver will suggest what dose is best for you. HOME CARE INSTRUCTIONS  Take any supplements that your caregiver prescribes. Follow the directions carefully. Take only the suggested  amount.  Have your blood tested 2 months after you start taking supplements.  Eat foods that contain vitamin D. Healthy choices include:  Fortified dairy products, cereals, or juices. Fortified means vitamin D has been added to the food. Check the label on the package to be sure.  Fatty fish like salmon or trout.  Eggs.  Oysters.  Do not use a tanning bed.  Keep your weight at a healthy level. Lose weight if you need to.  Keep all follow-up appointments. Your caregiver will need to perform blood tests to make sure your vitamin D deficiency is going away. SEEK MEDICAL CARE IF:  You have any questions about your treatment.  You continue to have symptoms of vitamin D deficiency.  You have nausea or vomiting.  You are constipated.  You feel confused.  You have severe abdominal or back pain. MAKE SURE YOU:  Understand these instructions.  Will watch your condition.  Will get help right away if you are not doing well or get worse. Document Released: 02/07/2012 Document Revised: 03/12/2013 Document Reviewed: 02/07/2012 [facility] Patient Information 2014 [place], [place].   Cholesterol Cholesterol is a white, waxy, fat-like protein needed by your body in small amounts. The liver makes all the cholesterol you need. It is carried from the liver by the blood through the blood vessels. Deposits (plaque) may build up on blood vessel walls. This makes the arteries narrower and stiffer. Plaque increases the risk for heart attack and stroke. You cannot feel your cholesterol level even if it is very high. The only way to know is by a blood test to check your lipid (fats) levels. Once you know your cholesterol levels, you should keep a record of the test results. Work with your caregiver to to keep your levels in the desired range. WHAT THE RESULTS MEAN:  Total cholesterol is a rough measure of all the cholesterol in your blood.  LDL is the so-called bad cholesterol. This is the type  that deposits cholesterol in the walls of the arteries. You want this level to be low.  HDL is the good cholesterol because it cleans the arteries and carries the LDL away. You want this level to be high.  Triglycerides are fat that the body can either burn for energy or store. High levels are closely linked to heart disease. DESIRED LEVELS:  Total cholesterol below 200.  LDL below 100 for people at risk, below 70 for very high risk.  HDL above 50 is good, above 60 is best.  Triglycerides below 150. HOW TO LOWER YOUR CHOLESTEROL:  Diet.  Choose fish or white meat chicken and [place], roasted or baked. Limit fatty cuts of red meat, fried foods, and processed meats, such as sausage and lunch meat.  Eat lots of fresh fruits and vegetables. Choose whole grains, beans, pasta, potatoes and cereals.  Use only small amounts of olive, corn or canola oils. Avoid butter, mayonnaise, shortening or palm kernel oils. Avoid foods with trans-fats.  Use skim/nonfat milk and low-fat/nonfat yogurt and cheeses. Avoid whole milk, cream, ice cream, egg yolks and cheeses. Healthy desserts include angel food cake, ginger snaps, animal crackers, hard candy, popsicles, and low-fat/nonfat frozen yogurt. Avoid pastries, cakes, pies and cookies.  Exercise.  A regular program helps decrease LDL and raises HDL.  Helps with weight control.  Do things that increase your activity level like gardening, walking, or taking the stairs.  Medication.  May be prescribed by your caregiver to help lowering cholesterol and the risk for heart disease.  You may need medicine even if your levels are normal if you have several risk factors. HOME CARE INSTRUCTIONS   Follow your diet and exercise programs as suggested by your caregiver.  Take medications as directed.  Have blood work done when your caregiver feels it is necessary. MAKE SURE YOU:   Understand  these instructions.  Will watch your condition.  Will get  help right away if you are not doing well or get worse. Document Released: 08/10/2001 Document Revised: 02/07/2012 Document Reviewed: 01/31/2008 [facility] Patient Information 2014 [place], [place].

## 2013-10-15 NOTE — Progress Notes (Signed)
Patient ID: Stephen Holland, male   DOB: 1951-03-13, 62 y.o.   MRN: [id number]  This very nice 62 yo MWMpresents for 3 month follow up with hypertension, hyperlipidemia, pre-diabetes and vitamin D deficiency.    BP has been controlled at home. Today's BP is  138/82. Patient denies any cardiac type chest pain, palpitations, dyspnea/orthopnea/PND, dizziness, claudication, or dependent edema.   Hyperlipidemia is controlled with diet & meds. Last cholesterol was  120, Triglycerides were 172, HDL 41, and LDL 45. Patient denies myalgias or other med SE's.    Also, the patient has history of prediabetes with last A1c of 5.7%. Patient denies any symptoms of reactive hypoglycemia, diabetic polys, paresthesias or visual blurring.   Patient was recently evaluated for LBP/Rt sciatica and referred to [facility] and is scheduled for a MRI. He does report significant improvement since taking a course of prednisone.   Further, Patient has history of vitamin D deficiency with last vitamin D of 83. Patient supplements vitamin D without any suspected side-effects.  Current Outpatient Prescriptions on File Prior to Visit  Medication Sig Dispense Refill  . allopurinol (ZYLOPRIM) 300 MG tablet Take 300 mg by mouth daily.      Marland Kitchen aspirin 81 MG tablet Take 81 mg by mouth daily.      . bisoprolol-hydrochlorothiazide (ZIAC) 10-6.25 MG per tablet Take 1 tablet by mouth daily.      . cholecalciferol (VITAMIN D) 1000 UNITS tablet Take 10,000 Units by mouth daily.      . clonazePAM (KLONOPIN) 1 MG tablet Take 1 mg by mouth at bedtime.      . hydrochlorothiazide (HYDRODIURIL) 25 MG tablet Take 25 mg by mouth daily.      Stephen Holland Oil 1000 MG CAPS Take by mouth.      . losartan (COZAAR) 100 MG tablet Take 100 mg by mouth daily.      . Magnesium 400 MG CAPS Take by mouth 2 (two) times daily.      . meloxicam (MOBIC) 15 MG tablet Take 15 mg by mouth daily.      . rosuvastatin (CRESTOR) 40 MG tablet Take 1 tablet (40 mg  total) by mouth daily. 1/2 pill  90 tablet  0    Allergies  Allergen Reactions  . Lidocaine     blisters    PMHx:   Past Medical History  Diagnosis Date  . Hypertension   . Hyperlipemia   . Prediabetes   . Obstructive sleep apnea     08/2012  . Vitamin D deficiency   . DJD (degenerative joint disease)   . Gout   . Prostate cancer     s/p prostatectomy  . ED (erectile dysfunction)   . Colon polyps     FHx:    Reviewed / unchanged  SHx:    Reviewed / unchanged  Systems Review: Constitutional: Denies fever, chills, wt changes, headaches, insomnia, fatigue, night sweats, change in appetite. Eyes: Denies redness, blurred vision, diplopia, discharge, itchy, watery eyes.  ENT: Denies discharge, congestion, post nasal drip, epistaxis, sore throat, earache, hearing loss, dental pain, tinnitus, vertigo, sinus pain, snoring.  CV: Denies chest pain, palpitations, irregular heartbeat, syncope, dyspnea, diaphoresis, orthopnea, PND, claudication, edema. Respiratory: denies cough, dyspnea, DOE, pleurisy, hoarseness, laryngitis, wheezing.  Gastrointestinal: Denies dysphagia, odynophagia, heartburn, reflux, water brash, abdominal pain or cramps, nausea, vomiting, bloating, diarrhea, constipation, hematemesis, melena, hematochezia,  Hemorrhoids. Genitourinary: Denies dysuria, frequency, urgency, nocturia, hesitancy, discharge, hematuria, flank pain. Musculoskeletal: Denies arthralgias, myalgias, stiffness, jt.  swelling, pain, limp, strain/sprain.  Skin: Denies pruritus, rash, hives, warts, acne, eczema, change in skin lesion(s). Neuro: No weakness, tremor, incoordination, spasms, paresthesia, or pain. Psychiatric: Denies confusion, memory loss, or sensory loss. Endo: Denies change in weight, skin, hair change.  Heme/Lymph: No excessive bleeding, bruising, orenlarged lymph nodes.   On Exam: Appears well nourished - in no distress. Eyes: PERRLA, EOMs, conjunctiva no swelling or  erythema. Sinuses: No frontal/maxillary tenderness ENT/Mouth: EAC's clear, TM's nl w/o erythema, bulging. Nares clear w/o erythema, swelling, exudates. Oropharynx clear without erythema or exudates. Oral hygiene is good. Tongue normal, non obstructing. Hearing intact.  Neck: Supple. Thyroid nl. Car 2+/2+ without bruits, nodes or JVD. Chest: Respirations nl with BS clear & equal w/o rales, rhonchi, wheezing or stridor.  Cor: Heart sounds normal w/ regular rate and rhythm without sig. murmurs, gallops, clicks, or rubs. Peripheral pulses normal and equal  without edema.  Abdomen: Soft & bowel sounds normal. Non-tender w/o guarding, rebound, hernias, masses, or organomegaly.  Lymphatics: Unremarkable.  Musculoskeletal: Full ROM all peripheral extremities, joint stability, 5/5 strength, and normal gait.  Skin: Warm, dry without exposed rashes, lesions, ecchymosis apparent.  Neuro: Cranial nerves intact, reflexes equal bilaterally. Sensory-motor testing grossly intact. Tendon reflexes grossly intact.  Pysch: Alert & oriented x 3. Insight and judgement nl & appropriate. No ideations.  Assessment and Plan:  1. Hypertension - Continue monitor blood pressure at home. Continue diet/meds same.  2. Hyperlipidemia - Continue diet/meds, exercise,& lifestyle modifications. Continue monitor periodic cholesterol/liver & renal functions   3. Pre-diabetes - Continue diet, exercise, lifestyle modifications. Monitor appropriate labs.  3. Diabetes - continue recommend prudent low glycemic diet, weight control, regular exercise, diabetic monitoring and periodic eye exams.  4. Vitamin D Deficiency - Continue supplementation.  5. LBP/Rt sciatica - r/o HNP  Further disposition pending results of labs.

## 2013-10-16 ENCOUNTER — Ambulatory Visit: Payer: Managed Care, Other (non HMO) | Admitting: Internal Medicine

## 2013-10-16 ENCOUNTER — Encounter: Payer: Self-pay | Admitting: Internal Medicine

## 2013-10-16 VITALS — BP 138/82 | HR 76 | Temp 97.9°F | Resp 18 | Ht 67.0 in | Wt 209.8 lb

## 2013-10-16 DIAGNOSIS — I10 Essential (primary) hypertension: Secondary | ICD-10-CM

## 2013-10-16 DIAGNOSIS — E785 Hyperlipidemia, unspecified: Secondary | ICD-10-CM

## 2013-10-16 DIAGNOSIS — E559 Vitamin D deficiency, unspecified: Secondary | ICD-10-CM

## 2013-10-16 DIAGNOSIS — R7303 Prediabetes: Secondary | ICD-10-CM

## 2013-10-16 DIAGNOSIS — Z79899 Other long term (current) drug therapy: Secondary | ICD-10-CM

## 2013-10-16 LAB — LIPID PANEL
Cholesterol: 152 mg/dL (ref 0–200)
HDL: 47 mg/dL (ref >39)
LDL Cholesterol: 67 mg/dL (ref 0–99)
Total CHOL/HDL Ratio: 3.2 Ratio
Triglycerides: 192 mg/dL — ABNORMAL HIGH (ref <150)
VLDL: 38 mg/dL (ref 0–40)

## 2013-10-16 LAB — CBC WITH DIFFERENTIAL/PLATELET
Basophils Absolute: 0 10*3/uL (ref 0.0–0.1)
Basophils Relative: 0 % (ref 0–1)
Eosinophils Absolute: 0.1 10*3/uL (ref 0.0–0.7)
Eosinophils Relative: 1 % (ref 0–5)
HCT: 50 % (ref 39.0–52.0)
Hemoglobin: 17.8 g/dL — ABNORMAL HIGH (ref 13.0–17.0)
Lymphocytes Relative: 22 % (ref 12–46)
Lymphs Abs: 2.3 10*3/uL (ref 0.7–4.0)
MCH: 34.6 pg — ABNORMAL HIGH (ref 26.0–34.0)
MCHC: 35.6 g/dL (ref 30.0–36.0)
MCV: 97.1 fL (ref 78.0–100.0)
Monocytes Absolute: 0.9 10*3/uL (ref 0.1–1.0)
Monocytes Relative: 8 % (ref 3–12)
Neutro Abs: 7.2 10*3/uL (ref 1.7–7.7)
Neutrophils Relative %: 69 % (ref 43–77)
Platelets: 156 10*3/uL (ref 150–400)
RBC: 5.15 MIL/uL (ref 4.22–5.81)
RDW: 13.9 % (ref 11.5–15.5)
WBC: 10.6 10*3/uL — ABNORMAL HIGH (ref 4.0–10.5)

## 2013-10-16 LAB — BASIC METABOLIC PANEL WITH GFR
BUN: 28 mg/dL — ABNORMAL HIGH (ref 6–23)
CO2: 22 mEq/L (ref 19–32)
Calcium: 9.9 mg/dL (ref 8.4–10.5)
Chloride: 104 mEq/L (ref 96–112)
Creat: 0.92 mg/dL (ref 0.50–1.35)
GFR, Est African American: >89 mL/min
GFR, Est Non African American: 89 mL/min
Glucose, Bld: 98 mg/dL (ref 70–99)
Potassium: 4.1 mEq/L (ref 3.5–5.3)
Sodium: 137 mEq/L (ref 135–145)

## 2013-10-16 LAB — HEMOGLOBIN A1C
Hgb A1c MFr Bld: 5.7 % — ABNORMAL HIGH (ref <5.7)
Mean Plasma Glucose: 117 mg/dL — ABNORMAL HIGH (ref <117)

## 2013-10-16 LAB — HEPATIC FUNCTION PANEL
ALT: 35 U/L (ref 0–53)
AST: 25 U/L (ref 0–37)
Albumin: 3.9 g/dL (ref 3.5–5.2)
Alkaline Phosphatase: 57 U/L (ref 39–117)
Bilirubin, Direct: 0.2 mg/dL (ref 0.0–0.3)
Indirect Bilirubin: 0.6 mg/dL (ref 0.0–0.9)
Total Bilirubin: 0.8 mg/dL (ref 0.3–1.2)
Total Protein: 6.8 g/dL (ref 6.0–8.3)

## 2013-10-16 LAB — TSH: TSH: 0.713 u[IU]/mL (ref 0.350–4.500)

## 2013-10-16 LAB — MAGNESIUM: Magnesium: 1.7 mg/dL (ref 1.5–2.5)

## 2013-10-17 ENCOUNTER — Telehealth: Payer: Self-pay | Admitting: *Deleted

## 2013-10-17 LAB — INSULIN, FASTING: Insulin fasting, serum: 49 u[IU]/mL — ABNORMAL HIGH (ref 3–28)

## 2013-10-17 LAB — VITAMIN D 25 HYDROXY (VIT D DEFICIENCY, FRACTURES): Vit D, 25-Hydroxy: 75 ng/mL (ref 30–89)

## 2013-10-17 NOTE — Telephone Encounter (Signed)
Pt aware of labs  

## 2013-10-17 NOTE — Telephone Encounter (Signed)
Lm with spouse to call about labs

## 2013-10-17 NOTE — Telephone Encounter (Signed)
Message copied by Reggy Eye on Wed Oct 17, 2013  2:10 PM ------      Message from: Lucky Cowboy      Created: Wed Oct 17, 2013 12:54 PM       Chol 152 - ldl 67 great but trig 192 sl high - Triglycerides or fats in blood are too high - recommend avoid fried & greasy foods,  sweets/candy, white rice (brown or wild rice or Quinoa is OK), white potatoes (sweet potatoes are OK) - anything made from white flour - bagels, doughnuts, rolls, buns, biscuits,white and wheat breads, pizza crust and traditional pasta made of white flour & egg white(vegetarian pasta or spinach or wheat pasta is OK).  Multi-grain bread is OK - like multi-grain flat bread or sandwich thins. Avoid alcohol in excess. Exercise is also important.      A1c 5.7% borderline high but insulin is very high - Blood sugar and A1c are elevated in the borderline and early or pre-diabetes range which has the same 300% increased risk for heart attack, stroke, cancer, and alzheimer- type vascular dementia as full blown diabetes. But the good news is that diet, exercise with weight loss can cure the early diabetes at this point.      All else normal and ok ------

## 2013-11-05 ENCOUNTER — Other Ambulatory Visit: Payer: Self-pay | Admitting: Internal Medicine

## 2013-11-05 MED ORDER — ALLOPURINOL 300 MG PO TABS: 300.0000 mg | ORAL_TABLET | Freq: Every day | ORAL | Status: DC

## 2013-12-28 ENCOUNTER — Other Ambulatory Visit: Payer: Self-pay | Admitting: Physician Assistant

## 2013-12-30 ENCOUNTER — Other Ambulatory Visit: Payer: Self-pay | Admitting: Emergency Medicine

## 2013-12-30 MED ORDER — LOSARTAN POTASSIUM-HCTZ 100-25 MG PO TABS: 1.0000 | ORAL_TABLET | Freq: Every day | ORAL | Status: DC

## 2014-01-06 ENCOUNTER — Other Ambulatory Visit: Payer: Self-pay | Admitting: Physician Assistant

## 2014-01-07 ENCOUNTER — Other Ambulatory Visit: Payer: Self-pay | Admitting: Physician Assistant

## 2014-02-06 ENCOUNTER — Other Ambulatory Visit: Payer: Self-pay | Admitting: Internal Medicine

## 2014-02-11 ENCOUNTER — Ambulatory Visit (INDEPENDENT_AMBULATORY_CARE_PROVIDER_SITE_OTHER): Payer: Managed Care, Other (non HMO) | Admitting: Emergency Medicine

## 2014-02-11 ENCOUNTER — Encounter: Payer: Self-pay | Admitting: Emergency Medicine

## 2014-02-11 VITALS — BP 158/90 | HR 68 | Temp 98.2°F | Resp 16 | Ht 67.0 in | Wt 216.0 lb

## 2014-02-11 DIAGNOSIS — E782 Mixed hyperlipidemia: Secondary | ICD-10-CM

## 2014-02-11 DIAGNOSIS — E785 Hyperlipidemia, unspecified: Secondary | ICD-10-CM

## 2014-02-11 DIAGNOSIS — I10 Essential (primary) hypertension: Secondary | ICD-10-CM

## 2014-02-11 DIAGNOSIS — E663 Overweight: Secondary | ICD-10-CM

## 2014-02-11 DIAGNOSIS — R7309 Other abnormal glucose: Secondary | ICD-10-CM

## 2014-02-11 DIAGNOSIS — F17200 Nicotine dependence, unspecified, uncomplicated: Secondary | ICD-10-CM

## 2014-02-11 DIAGNOSIS — G609 Hereditary and idiopathic neuropathy, unspecified: Secondary | ICD-10-CM

## 2014-02-11 LAB — CBC WITH DIFFERENTIAL/PLATELET
Basophils Absolute: 0.1 10*3/uL (ref 0.0–0.1)
Basophils Relative: 1 % (ref 0–1)
Eosinophils Absolute: 0.1 10*3/uL (ref 0.0–0.7)
Eosinophils Relative: 1 % (ref 0–5)
HCT: 45.6 % (ref 39.0–52.0)
Hemoglobin: 16 g/dL (ref 13.0–17.0)
Lymphocytes Relative: 24 % (ref 12–46)
Lymphs Abs: 2.8 10*3/uL (ref 0.7–4.0)
MCH: 33.8 pg (ref 26.0–34.0)
MCHC: 35.1 g/dL (ref 30.0–36.0)
MCV: 96.2 fL (ref 78.0–100.0)
Monocytes Absolute: 1 10*3/uL (ref 0.1–1.0)
Monocytes Relative: 9 % (ref 3–12)
Neutro Abs: 7.5 10*3/uL (ref 1.7–7.7)
Neutrophils Relative %: 65 % (ref 43–77)
Platelets: 188 10*3/uL (ref 150–400)
RBC: 4.74 MIL/uL (ref 4.22–5.81)
RDW: 13.5 % (ref 11.5–15.5)
WBC: 11.5 10*3/uL — ABNORMAL HIGH (ref 4.0–10.5)

## 2014-02-11 LAB — HEMOGLOBIN A1C
Hgb A1c MFr Bld: 5.6 % (ref <5.7)
Mean Plasma Glucose: 114 mg/dL (ref <117)

## 2014-02-11 NOTE — Progress Notes (Signed)
Subjective:    Patient ID: Stephen Holland, male    DOB: 1951-11-13, 63 y.o.   MRN: [id number]  HPI Comments: 63 yo obese male presents for 3 month F/U for HTN, Cholesterol, Pre-Dm, D. Deficient. He has not been exercising routinely but keeps busy. He has not been eating healthy. He is trying to improve diet some with decreasing ETOH a little. He notes exercise is difficult with neuropathy and denies complete relief with injections from ortho. He notes BP better at home in 130-20s/ 80s. LAST LABS CREATININE     0.92   10/16/2013 BUN              28   10/16/2013 NA              137   10/16/2013 K               4.1   10/16/2013 CL              104   10/16/2013 CO2              22   10/16/2013 WBC     10.6   10/16/2013 HGB     17.8   10/16/2013 HCT     50.0   10/16/2013 MCV     97.1   10/16/2013 PLT      156   10/16/2013 HGBA1C      5.7   10/16/2013 CHOL         152   10/16/2013 HDL           47   10/16/2013 LDLCALC       67   10/16/2013 TRIG         192   10/16/2013 CHOLHDL      3.2   10/16/2013 ALT           35   10/16/2013 AST           25   10/16/2013 ALKPHOS       57   10/16/2013 BILITOT      0.8   10/16/2013   Dr Mina Marble started him on Gabapentin 300 TID for toe pain which has helped some. He has f/u soon.  Hyperlipidemia  Hypertension   Current Outpatient Prescriptions on File Prior to Visit  Medication Sig Dispense Refill  . allopurinol (ZYLOPRIM) 300 MG tablet Take 1 tablet (300 mg total) by mouth daily.  90 tablet  1  . aspirin 81 MG tablet Take 81 mg by mouth daily.      . bisoprolol-hydrochlorothiazide (ZIAC) 10-6.25 MG per tablet TAKE ONE TABLET BY MOUTH EVERY DAY FOR BLOOD PRESSURE  90 tablet  0  . cholecalciferol (VITAMIN D) 1000 UNITS tablet Take 10,000 Units by mouth daily.      . clonazePAM (KLONOPIN) 1 MG tablet TAKE ONE TABLET BY MOUTH AT BEDTIME FOR RESTLESS LEGS  30 tablet  1  . Krill Oil 1000 MG CAPS Take by mouth.      . losartan-hydrochlorothiazide  (HYZAAR) 100-25 MG per tablet Take 1 tablet by mouth daily.  90 tablet  1  . Magnesium 400 MG CAPS Take by mouth 2 (two) times daily.      . meloxicam (MOBIC) 15 MG tablet Take 15 mg by mouth daily.      . rosuvastatin (CRESTOR) 40 MG tablet Take 1 tablet (40 mg total) by mouth daily. 1/2 pill  90 tablet  0   No current facility-administered medications on  file prior to visit.   Allergies  Allergen Reactions  . Lidocaine     blisters    Past Medical History  Diagnosis Date  . Hypertension   . Hyperlipemia   . Prediabetes   . Obstructive sleep apnea     08/2012  . Vitamin D deficiency   . DJD (degenerative joint disease)   . Gout   . Prostate cancer     s/p prostatectomy  . ED (erectile dysfunction)   . Colon polyps       Review of Systems  Musculoskeletal: Positive for back pain and gait problem.  Neurological: Positive for numbness.  All other systems reviewed and are negative.   BP 158/90  Pulse 68  Temp(Src) 98.2 F (36.8 C) (Temporal)  Resp 16  Ht 5\' 7"  (1.702 m)  Wt 216 lb (97.977 kg)  BMI 33.82 kg/m2     Objective:   Physical Exam  Nursing note and vitals reviewed. Constitutional: He is oriented to person, place, and time. He appears well-developed and well-nourished.  Central obesity  HENT:  Head: Normocephalic and atraumatic.  Right Ear: External ear normal.  Left Ear: External ear normal.  Nose: Nose normal.  Eyes: Conjunctivae and EOM are normal.  Neck: Normal range of motion. Neck supple. No JVD present. No thyromegaly present.  Cardiovascular: Normal rate, regular rhythm, normal heart sounds and intact distal pulses.   Pulmonary/Chest: Effort normal and breath sounds normal.  Abdominal: Soft. Bowel sounds are normal. He exhibits distension. He exhibits no mass. There is no tenderness. There is no rebound and no guarding.  Musculoskeletal: Normal range of motion. He exhibits no edema and no tenderness.  Lymphadenopathy:    He has no cervical  adenopathy.  Neurological: He is alert and oriented to person, place, and time. He has normal reflexes. A cranial nerve deficit is present. Coordination normal.  Both feet in toes decreased sensation  Skin: Skin is warm and dry.  Psychiatric: He has a normal mood and affect. His behavior is normal. Judgment and thought content normal.          Assessment & Plan:  1.  3 month F/U for overweight,HTN, Cholesterol, Pre-Dm, D. Deficient. Needs healthy diet, cardio QD and obtain healthy weight. Check Labs, Check BP if >130/80 call office  2. Peripheral neuropathy w/ tobacco/ ETOH use- Advised d/c use of Tobacco, decrease ETOH, increase activity, Ref NEURO

## 2014-02-11 NOTE — Patient Instructions (Addendum)
Smoking Cessation, Tips for Success If you are ready to quit smoking, congratulations! You have chosen to help yourself be healthier. Cigarettes bring nicotine, tar, carbon monoxide, and other irritants into your body. Your lungs, heart, and blood vessels will be able to work better without these poisons. There are many different ways to quit smoking. Nicotine gum, nicotine patches, a nicotine inhaler, or nicotine nasal spray can help with physical craving. Hypnosis, support groups, and medicines help break the habit of smoking. WHAT THINGS CAN I DO TO MAKE QUITTING EASIER?  Here are some tips to help you quit for good:  Pick a date when you will quit smoking completely. Tell all of your friends and family about your plan to quit on that date.  Do not try to slowly cut down on the number of cigarettes you are smoking. Pick a quit date and quit smoking completely starting on that day.  Throw away all cigarettes.   Clean and remove all ashtrays from your home, work, and car.   On a card, write down your reasons for quitting. Carry the card with you and read it when you get the urge to smoke.   Cleanse your body of nicotine. Drink enough water and fluids to keep your urine clear or pale yellow. Do this after quitting to flush the nicotine from your body.   Learn to predict your moods. Do not let a bad situation be your excuse to have a cigarette. Some situations in your life might tempt you into wanting a cigarette.   Never have "just one" cigarette. It leads to wanting another and another. Remind yourself of your decision to quit.   Change habits associated with smoking. If you smoked while driving or when feeling stressed, try other activities to replace smoking. Stand up when drinking your coffee. Brush your teeth after eating. Sit in a different chair when you read the paper. Avoid alcohol while trying to quit, and try to drink fewer caffeinated beverages. Alcohol and caffeine may urge  you to smoke.   Avoid foods and drinks that can trigger a desire to smoke, such as sugary or spicy foods and alcohol.   Ask people who smoke not to smoke around you.   Have something planned to do right after eating or having a cup of coffee. For example, plan to take a walk or exercise.   Try a relaxation exercise to calm you down and decrease your stress. Remember, you may be tense and nervous for the first 2 weeks after you quit, but this will pass.   Find new activities to keep your hands busy. Play with a pen, coin, or rubber band. Doodle or draw things on paper.   Brush your teeth right after eating. This will help cut down on the craving for the taste of tobacco after meals. You can also try mouthwash.   Use oral substitutes in place of cigarettes. Try using lemon drops, carrots, cinnamon sticks, or chewing gum. Keep them handy so they are available when you have the urge to smoke.   When you have the urge to smoke, try deep breathing.   Designate your home as a nonsmoking area.   If you are a heavy smoker, ask your health care provider about a prescription for nicotine chewing gum. It can ease your withdrawal from nicotine.   Reward yourself. Set aside the cigarette money you save and buy yourself something nice.   Look for support from others. Join a support group or   smoking cessation program. Ask someone at home or at work to help you with your plan to quit smoking.   Always ask yourself, "Do I need this cigarette or is this just a reflex?" Tell yourself, "Today, I choose not to smoke," or "I do not want to smoke." You are reminding yourself of your decision to quit.  Do not replace cigarette smoking with electronic cigarettes (commonly called e-cigarettes). The safety of e-cigarettes is unknown, and some may contain harmful chemicals.  If you relapse, do not give up! Plan ahead and think about what you will do the next time you get the urge to smoke.  HOW WILL  I FEEL WHEN I QUIT SMOKING? You may have symptoms of withdrawal because your body is used to nicotine (the addictive substance in cigarettes). You may crave cigarettes, be irritable, feel very hungry, cough often, get headaches, or have difficulty concentrating. The withdrawal symptoms are only temporary. They are strongest when you first quit but will go away within 10 14 days. When withdrawal symptoms occur, stay in control. Think about your reasons for quitting. Remind yourself that these are signs that your body is healing and getting used to being without cigarettes. Remember that withdrawal symptoms are easier to treat than the major diseases that smoking can cause.  Even after the withdrawal is over, expect periodic urges to smoke. However, these cravings are generally short lived and will go away whether you smoke or not. Do not smoke!  WHAT RESOURCES ARE AVAILABLE TO HELP ME QUIT SMOKING? Your health care provider can direct you to community resources or hospitals for support, which may include:  Group support.  Education.  Hypnosis.  Therapy. Document Released: 08/13/2004 Document Revised: 09/05/2013 Document Reviewed: 05/03/2013 [facility] Patient Information 2014 [place], [place]. Peripheral Neuropathy Peripheral neuropathy is a type of nerve damage. It affects nerves that carry signals between the spinal cord and other parts of the body. These are called peripheral nerves. With peripheral neuropathy, one nerve or a group of nerves may be damaged.  CAUSES  Many things can damage peripheral nerves. For some people with peripheral neuropathy, the cause is unknown. Some causes include:  Diabetes. This is the most common cause of peripheral neuropathy.  Injury to a nerve.  Pressure or stress on a nerve that lasts a long time.  Too little vitamin B. Alcoholism can lead to this.  Infections.  Autoimmune diseases, such as multiple sclerosis and systemic lupus  erythematosus.  Inherited nerve diseases.  Some medicines, such as cancer drugs.  Toxic substances, such as lead and mercury.  Too little blood flowing to the legs.  Kidney disease.  Thyroid disease. SIGNS AND SYMPTOMS  Different people have different symptoms. The symptoms you have will depend on which of your nerves is damaged. Common symptoms include:  Loss of feeling (numbness) in the feet and hands.  Tingling in the feet and hands.  Pain that burns.  Very sensitive skin.  Weakness.  Not being able to move a part of the body (paralysis).  Muscle twitching.  Clumsiness or poor coordination.  Loss of balance.  Not being able to control your bladder.  Feeling dizzy.  Sexual problems. DIAGNOSIS  Peripheral neuropathy is a symptom, not a disease. Finding the cause of peripheral neuropathy can be hard. To figure that out, your health care provider will take a medical history and do a physical exam. A neurological exam will also be done. This involves checking things affected by your brain, spinal cord, and  nerves (nervous system). For example, your health care provider will check your reflexes, how you move, and what you can feel.  Other types of tests may also be ordered, such as:  Blood tests.  A test of the fluid in your spinal cord.  Imaging tests, such as CT scans or an MRI.  Electromyography (EMG). This test checks the nerves that control muscles.  Nerve conduction velocity tests. These tests check how fast messages pass through your nerves.  Nerve biopsy. A small piece of nerve is removed. It is then checked under a microscope. TREATMENT   Medicine is often used to treat peripheral neuropathy. Medicines may include:  Pain-relieving medicines. Prescription or over-the-counter medicine may be suggested.  Antiseizure medicine. This may be used for pain.  Antidepressants. These also may help ease pain from neuropathy.  Lidocaine. This is a numbing  medicine. You might wear a patch or be given a shot.  Mexiletine. This medicine is typically used to help control irregular heart rhythms.  Surgery. Surgery may be needed to relieve pressure on a nerve or to destroy a nerve that is causing pain.  Physical therapy to help movement.  Assistive devices to help movement. HOME CARE INSTRUCTIONS   Only take over-the-counter or prescription medicines as directed by your health care provider. Follow the instructions carefully for any given medicines. Do not take any other medicines without first getting approval from your health care provider.  If you have diabetes, work closely with your health care provider to keep your blood sugar under control.  If you have numbness in your feet:  Check every day for signs of injury or infection. Watch for redness, warmth, and swelling.  Wear padded socks and comfortable shoes. These help protect your feet.  Do not do things that put pressure on your damaged nerve.  Do not smoke. Smoking keeps blood from getting to damaged nerves.  Avoid or limit alcohol. Too much alcohol can cause a lack of B vitamins. These vitamins are needed for healthy nerves.  Develop a good support system. Coping with peripheral neuropathy can be stressful. Talk to a mental health specialist or join a support group if you are struggling.  Follow up with your health care provider as directed. SEEK MEDICAL CARE IF:   You have new signs or symptoms of peripheral neuropathy.  You are struggling emotionally from dealing with peripheral neuropathy.  You have a fever. SEEK IMMEDIATE MEDICAL CARE IF:   You have an injury or infection that is not healing.  You feel very dizzy or begin vomiting.  You have chest pain.  You have trouble breathing. Document Released: 11/05/2002 Document Revised: 07/28/2011 Document Reviewed: 07/23/2013 [facility] Patient Information 2014 [place].   Bad carbs also include fruit juice,  alcohol, and sweet tea. These are empty calories that do not signal to your brain that you are full.   Please remember the good carbs are still carbs which convert into sugar. So please measure them out no more than 1/2-1 cup of rice, oatmeal, pasta, and beans.  Veggies are however free foods! Pile them on.   I like lean protein at every meal such as chicken, [place], pork chops, cottage cheese, etc. Just do not fry these meats and please center your meal around vegetable, the meats should be a side dish.   No all fruit is created equal. Please see the list below, the fruit at the bottom is higher in sugars than the fruit at the top  We want weight loss that will last so you should lose 1-2 pounds a week.  THAT IS IT! Please pick THREE things a month to change. Once it is a habit check off the item. Then pick another three items off the list to become habits.  If you are already doing a habit on the list GREAT!  Cross that item off! o Don't drink your calories. Ie, alcohol, soda, fruit juice, and sweet tea.  o Drink more water. Drink a glass when you feel hungry or before each meal.  o Eat breakfast - Complex carb and protein (likeDannon light and fit yogurt, oatmeal, fruit, eggs, [place] bacon). o Measure your cereal.  Eat no more than one cup a day. (ie [place]) o Eat an apple a day. o Add a vegetable a day. o Try a new vegetable a month. o Use Pam! Stop using oil or butter to cook. o Don't finish your plate or use smaller plates. o Share your dessert. o Eat sugar free Jello for dessert or frozen grapes. o Don't eat 2-3 hours before bed. o Switch to whole wheat bread, pasta, and brown rice. o Make healthier choices when you eat out. No fries! o Pick baked chicken, NOT fried. o Don't forget to SLOW DOWN when you eat. It is not going anywhere.  o Take the stairs. o Park far away in the parking lot o [employer] (or weights) for 10 minutes while watching TV. o Walk at work for 10 minutes  during break. o Walk outside 1 time a week with your friend, kids, dog, or significant other. o Start a walking group at [place] the mall as much as you can tolerate.  o Keep a food diary. o Weigh yourself daily. o Walk for 15 minutes 3 days per week. o Cook at home more often and eat out less.  If life happens and you go back to old habits, it is okay.  Just start over. You can do it!   If you experience chest pain, get short of breath, or tired during the exercise, please stop immediately and inform your doctor.

## 2014-02-12 ENCOUNTER — Other Ambulatory Visit: Payer: Self-pay | Admitting: Emergency Medicine

## 2014-02-12 LAB — HEPATIC FUNCTION PANEL
ALT: 29 U/L (ref 0–53)
AST: 27 U/L (ref 0–37)
Albumin: 4.3 g/dL (ref 3.5–5.2)
Alkaline Phosphatase: 78 U/L (ref 39–117)
Bilirubin, Direct: 0.2 mg/dL (ref 0.0–0.3)
Indirect Bilirubin: 0.6 mg/dL (ref 0.2–1.2)
Total Bilirubin: 0.8 mg/dL (ref 0.2–1.2)
Total Protein: 7 g/dL (ref 6.0–8.3)

## 2014-02-12 LAB — LIPID PANEL
Cholesterol: 98 mg/dL (ref 0–200)
HDL: 38 mg/dL — ABNORMAL LOW (ref >39)
LDL Cholesterol: 34 mg/dL (ref 0–99)
Total CHOL/HDL Ratio: 2.6 Ratio
Triglycerides: 131 mg/dL (ref <150)
VLDL: 26 mg/dL (ref 0–40)

## 2014-02-12 LAB — BASIC METABOLIC PANEL WITH GFR
BUN: 17 mg/dL (ref 6–23)
CO2: 27 mEq/L (ref 19–32)
Calcium: 9.8 mg/dL (ref 8.4–10.5)
Chloride: 102 mEq/L (ref 96–112)
Creat: 0.93 mg/dL (ref 0.50–1.35)
GFR, Est African American: >89 mL/min
GFR, Est Non African American: 87 mL/min
Glucose, Bld: 99 mg/dL (ref 70–99)
Potassium: 4.7 mEq/L (ref 3.5–5.3)
Sodium: 138 mEq/L (ref 135–145)

## 2014-02-12 LAB — INSULIN, FASTING: Insulin fasting, serum: 40 u[IU]/mL — ABNORMAL HIGH (ref 3–28)

## 2014-02-12 MED ORDER — DOXYCYCLINE HYCLATE 100 MG PO TABS: 100.0000 mg | ORAL_TABLET | Freq: Two times a day (BID) | ORAL | Status: DC

## 2014-03-16 ENCOUNTER — Other Ambulatory Visit: Payer: Self-pay | Admitting: Physician Assistant

## 2014-03-18 ENCOUNTER — Other Ambulatory Visit: Payer: Self-pay | Admitting: Physician Assistant

## 2014-03-27 ENCOUNTER — Other Ambulatory Visit: Payer: Self-pay | Admitting: Internal Medicine

## 2014-03-28 ENCOUNTER — Ambulatory Visit: Payer: Managed Care, Other (non HMO) | Admitting: Neurology

## 2014-04-16 ENCOUNTER — Other Ambulatory Visit: Payer: Self-pay

## 2014-04-16 ENCOUNTER — Telehealth: Payer: Self-pay

## 2014-04-16 DIAGNOSIS — G609 Hereditary and idiopathic neuropathy, unspecified: Secondary | ICD-10-CM

## 2014-04-16 NOTE — Telephone Encounter (Signed)
Pt called to check status of referral to [facility]. Dr.Nudleman did not agree to see pt. Pt was referred to see Dr.Patel @ [facility]. Pt cancelled appt w/ Dr.Patel. He is requesting to see a different neurologist, but cannot remember the name of the dr. Abbott Pao WCB w/ dr.'s name

## 2014-05-23 ENCOUNTER — Ambulatory Visit (INDEPENDENT_AMBULATORY_CARE_PROVIDER_SITE_OTHER): Payer: Managed Care, Other (non HMO) | Admitting: Internal Medicine

## 2014-05-23 ENCOUNTER — Encounter: Payer: Self-pay | Admitting: Internal Medicine

## 2014-05-23 VITALS — BP 118/76 | HR 60 | Temp 98.1°F | Resp 16 | Ht 67.0 in | Wt 217.2 lb

## 2014-05-23 DIAGNOSIS — F17200 Nicotine dependence, unspecified, uncomplicated: Secondary | ICD-10-CM

## 2014-05-23 DIAGNOSIS — R7401 Elevation of levels of liver transaminase levels: Secondary | ICD-10-CM

## 2014-05-23 DIAGNOSIS — Z113 Encounter for screening for infections with a predominantly sexual mode of transmission: Secondary | ICD-10-CM

## 2014-05-23 DIAGNOSIS — Z125 Encounter for screening for malignant neoplasm of prostate: Secondary | ICD-10-CM

## 2014-05-23 DIAGNOSIS — I10 Essential (primary) hypertension: Secondary | ICD-10-CM

## 2014-05-23 DIAGNOSIS — Z111 Encounter for screening for respiratory tuberculosis: Secondary | ICD-10-CM

## 2014-05-23 DIAGNOSIS — Z0000 Encounter for general adult medical examination without abnormal findings: Secondary | ICD-10-CM

## 2014-05-23 DIAGNOSIS — R7402 Elevation of levels of lactic acid dehydrogenase (LDH): Secondary | ICD-10-CM

## 2014-05-23 DIAGNOSIS — Z1212 Encounter for screening for malignant neoplasm of rectum: Secondary | ICD-10-CM

## 2014-05-23 DIAGNOSIS — F19939 Other psychoactive substance use, unspecified with withdrawal, unspecified: Secondary | ICD-10-CM

## 2014-05-23 DIAGNOSIS — F19239 Other psychoactive substance dependence with withdrawal, unspecified: Secondary | ICD-10-CM

## 2014-05-23 DIAGNOSIS — F17203 Nicotine dependence unspecified, with withdrawal: Secondary | ICD-10-CM

## 2014-05-23 DIAGNOSIS — R740 Nonspecific elevation of levels of transaminase and lactic acid dehydrogenase [LDH]: Secondary | ICD-10-CM

## 2014-05-23 DIAGNOSIS — E559 Vitamin D deficiency, unspecified: Secondary | ICD-10-CM

## 2014-05-23 LAB — BASIC METABOLIC PANEL WITH GFR
BUN: 21 mg/dL (ref 6–23)
CO2: 30 mEq/L (ref 19–32)
Calcium: 9.5 mg/dL (ref 8.4–10.5)
Chloride: 98 mEq/L (ref 96–112)
Creat: 1.06 mg/dL (ref 0.50–1.35)
GFR, Est African American: 86 mL/min
GFR, Est Non African American: 74 mL/min
Glucose, Bld: 86 mg/dL (ref 70–99)
Potassium: 4.1 mEq/L (ref 3.5–5.3)
Sodium: 138 mEq/L (ref 135–145)

## 2014-05-23 LAB — CBC WITH DIFFERENTIAL/PLATELET
Basophils Absolute: 0 10*3/uL (ref 0.0–0.1)
Basophils Relative: 0 % (ref 0–1)
Eosinophils Absolute: 0.1 10*3/uL (ref 0.0–0.7)
Eosinophils Relative: 1 % (ref 0–5)
HCT: 48.7 % (ref 39.0–52.0)
Hemoglobin: 17.2 g/dL — ABNORMAL HIGH (ref 13.0–17.0)
Lymphocytes Relative: 26 % (ref 12–46)
Lymphs Abs: 1.8 10*3/uL (ref 0.7–4.0)
MCH: 33.1 pg (ref 26.0–34.0)
MCHC: 35.3 g/dL (ref 30.0–36.0)
MCV: 93.7 fL (ref 78.0–100.0)
Monocytes Absolute: 0.8 10*3/uL (ref 0.1–1.0)
Monocytes Relative: 11 % (ref 3–12)
Neutro Abs: 4.3 10*3/uL (ref 1.7–7.7)
Neutrophils Relative %: 62 % (ref 43–77)
Platelets: 162 10*3/uL (ref 150–400)
RBC: 5.2 MIL/uL (ref 4.22–5.81)
RDW: 13.5 % (ref 11.5–15.5)
WBC: 6.9 10*3/uL (ref 4.0–10.5)

## 2014-05-23 LAB — HEPATIC FUNCTION PANEL
ALT: 28 U/L (ref 0–53)
AST: 27 U/L (ref 0–37)
Albumin: 4.2 g/dL (ref 3.5–5.2)
Alkaline Phosphatase: 54 U/L (ref 39–117)
Bilirubin, Direct: 0.1 mg/dL (ref 0.0–0.3)
Indirect Bilirubin: 0.6 mg/dL (ref 0.2–1.2)
Total Bilirubin: 0.7 mg/dL (ref 0.2–1.2)
Total Protein: 6.8 g/dL (ref 6.0–8.3)

## 2014-05-23 LAB — LIPID PANEL
Cholesterol: 137 mg/dL (ref 0–200)
HDL: 37 mg/dL — ABNORMAL LOW (ref >39)
LDL Cholesterol: 48 mg/dL (ref 0–99)
Total CHOL/HDL Ratio: 3.7 Ratio
Triglycerides: 261 mg/dL — ABNORMAL HIGH (ref <150)
VLDL: 52 mg/dL — ABNORMAL HIGH (ref 0–40)

## 2014-05-23 LAB — RPR

## 2014-05-23 LAB — TSH: TSH: 1.095 u[IU]/mL (ref 0.350–4.500)

## 2014-05-23 LAB — VITAMIN B12: Vitamin B-12: 467 pg/mL (ref 211–911)

## 2014-05-23 LAB — MAGNESIUM: Magnesium: 1.8 mg/dL (ref 1.5–2.5)

## 2014-05-23 LAB — HEMOGLOBIN A1C
Hgb A1c MFr Bld: 5.8 % — ABNORMAL HIGH (ref <5.7)
Mean Plasma Glucose: 120 mg/dL — ABNORMAL HIGH (ref <117)

## 2014-05-23 LAB — HIV ANTIBODY (ROUTINE TESTING W REFLEX): HIV 1&2 Ab, 4th Generation: NONREACTIVE

## 2014-05-23 LAB — URIC ACID: Uric Acid, Serum: 5.2 mg/dL (ref 4.0–7.8)

## 2014-05-23 MED ORDER — PHENTERMINE HCL 37.5 MG PO TABS: ORAL_TABLET | ORAL | Status: DC

## 2014-05-23 NOTE — Progress Notes (Signed)
Patient ID: Stephen Holland, male   DOB: 14-Nov-1951, 63 y.o.   MRN: [id number]   Annual Screening Comprehensive Examination  This very nice 63 y.o.MWM presents for complete physical.  Patient has been followed for HTN, Prediabetes, Hyperlipidemia, and Vitamin D Deficiency. Patient has OSA and reports continuous usage with improved sense of well being, more energy and per wife - less snoring.   HTN predates since the 1980's. Patient's BP has been controlled at home.Today's BP: 118/76 mmHg. He did have a negative ETT in 2000. Patient denies any cardiac symptoms as chest pain, palpitations, shortness of breath, dizziness or ankle swelling. Patient does have a 56 yr smoking Hx/o 1-2 PPD. Also he has Hx/o gout which is controlled on allopurinol and no recent flares of gout pains.   Patient's hyperlipidemia is controlled with diet and medications. Patient denies myalgias or other medication SE's. Last lipids in Mar 2015 were at goal.   Lab Results  Component Value Date   CHOL 98 02/11/2014   HDL 38* 02/11/2014   LDLCALC 34 02/11/2014   TRIG 131 02/11/2014   CHOLHDL 2.6 02/11/2014    Patient has Morbid Obesity (BMI 34) and is screened for PreDiabetes with last A1c was 5.6% in Mar 2015. Patient denies reactive hypoglycemic symptoms, visual blurring, diabetic polys, or paresthesias.   Finally, patient has history of Vitamin D Deficiency of 12 in 2009 and last vitamin D was 75 in Nov 2014.  Medication Sig  . allopurinol (ZYLOPRIM) 300 MG tab Take 1 tablet daily.  Marland Kitchen aspirin 81 MG tab Take 81 mg  daily.  . bisoprolol-hctz 10-6.25 MG  TAKE ONE TAB EVERY   . clonazePAM (KLONOPIN) 1 MG tab TAKE ONE TAB AT BEDTIME  . CRESTOR 40 MG tab TAKE 1 TAB EVERY DAY  . Krill Oil 1000 MG CAPS Take by mouth.  . losartan-hctz 100-25 MG per tab Take 1 tablet daily.  . Magnesium 400 MG CAPS Take by mouth. Takes 800 mg daily  . meloxicam  15 MG tab Take 15 mg by mouth daily.   Allergies  Allergen Reactions  . Lidocaine     blisters   Past Medical History  Diagnosis Date  . Hypertension   . Hyperlipemia   . Prediabetes   . Obstructive sleep apnea     08/2012  . Vitamin D deficiency   . DJD (degenerative joint disease)   . Gout   . Prostate cancer     s/p prostatectomy  . ED (erectile dysfunction)   . Colon polyps    Past Surgical History  Procedure Laterality Date  . Prostatectomy  2003   Family History  Problem Relation Age of Onset  . Hypertension Mother    History   Social History  . Marital Status: Married    Spouse Name: N/A    Number of Children: N/A  . Years of Education: N/A   Occupational History  . Not on file.   Social History Main Topics  . Smoking status: Current Every Day Smoker -- 1.00 packs/day for 45 years    Types: Cigarettes  . Smokeless tobacco: Never Used  . Alcohol Use: 7.0 oz/week    14 drink(s) per week  . Drug Use: No  . Sexual Activity: Not on file    ROS Constitutional: Denies fever, chills, weight loss/gain, headaches, insomnia, fatigue, night sweats or change in appetite. Eyes: Denies redness, blurred vision, diplopia, discharge, itchy or watery eyes.  ENT: Denies discharge, congestion, post nasal drip, epistaxis,  sore throat, earache, hearing loss, dental pain, Tinnitus, Vertigo, Sinus pain or snoring.  Cardio: Denies chest pain, palpitations, irregular heartbeat, syncope, dyspnea, diaphoresis, orthopnea, PND, claudication or edema Respiratory: denies cough, dyspnea, DOE, pleurisy, hoarseness, laryngitis or wheezing.  Gastrointestinal: Denies dysphagia, heartburn, reflux, water brash, pain, cramps, nausea, vomiting, bloating, diarrhea, constipation, hematemesis, melena, hematochezia, jaundice or hemorrhoids Genitourinary: Denies dysuria, frequency, urgency, nocturia, hesitancy, discharge, hematuria or flank pain Musculoskeletal: Denies arthralgia, myalgia, stiffness, Jt. Swelling, pain, limp or strain/sprain. Skin: Denies puritis, rash, hives, warts,  acne, eczema or change in skin lesion Neuro: No weakness, tremor, incoordination, spasms, paresthesia or pain Psychiatric: Denies confusion, memory loss or sensory loss Endocrine: Denies change in weight, skin, hair change, nocturia, and paresthesia, diabetic polys, visual blurring or hyper / hypo glycemic episodes.  Heme/Lymph: No excessive bleeding, bruising or enlarged lymph nodes. Physical Exam  BP 118/76  P 60  T 98.1 F   Resp 16  Ht 5\' 7"  Wt 217 lb 3.2 oz   BMI 34.01 kg/m2  General Appearance: Over nourished/Obese and in no apparent distress. Eyes: PERRLA, EOMs, conjunctiva no swelling or erythema, normal fundi and vessels. Sinuses: No frontal/maxillary tenderness ENT/Mouth: EACs patent / TMs  nl. Nares clear without erythema, swelling, mucoid exudates. Oral hygiene is good. No erythema, swelling, or exudate. Tongue normal, non-obstructing. Tonsils not swollen or erythematous. Hearing normal.  Neck: Supple, thyroid normal. No bruits, nodes or JVD. Respiratory: Respiratory effort normal.  BS distant, equal and clear bilateral without rales, rhonci, wheezing or stridor. Cardio: Heart sounds are normal with regular rate and rhythm and no murmurs, rubs or gallops. Peripheral pulses are normal and equal bilaterally without edema. No aortic or femoral bruits. Chest: Barrel chested.  Abdomen: Flat, soft, with bowl sounds. Nontender, no guarding, rebound, hernias, masses, or organomegaly.  Lymphatics: Non tender without lymphadenopathy.  Genitourinary: No hernias.Testes nl. DRE - prostate nl for age - smooth & firm w/o nodules. Musculoskeletal: Full ROM all peripheral extremities, joint stability, 5/5 strength, and normal gait. Skin: Warm and dry without rashes, lesions, cyanosis, clubbing or  ecchymosis.  Neuro: Cranial nerves intact, reflexes equal bilaterally. Normal muscle tone, no cerebellar symptoms. Sensation intact.  Pysch: Awake and oriented X 3with normal affect, insight and  judgment appropriate.   Assessment and Plan  1. Annual Screening Examination 2. Hypertension  3. Hyperlipidemia 4. Pre Diabetes 5. Vitamin D Deficiency 6. OSA 7. Gout  Continue prudent diet as discussed, weight control, BP monitoring, regular exercise, and medications as discussed.  Discussed med effects and SE's. Routine screening labs and tests as requested with regular follow-up as recommended. Long discussion regarding the dangers and consequences of continued smoking. He did agree to getting a CXR.

## 2014-05-23 NOTE — Patient Instructions (Signed)
Recommend the book "the END of DIETING" by Dr Baker Janus   and the  Book "The END of DIABETES " by Dr Excell Seltzer  At [email or web address] - get book & Audio CD's      Being diabetic has a  300% increased risk for heart attack, stroke, cancer, and alzheimer- type vascular dementia. It is very important that you work harder with diet by avoiding all foods that are white except chicken & fish. Avoid white rice (brown & wild rice is OK), white potatoes (sweetpotatoes in moderation is OK), White bread or wheat bread or anything made out of white flour like bagels, donuts, rolls, buns, biscuits, cakes, pastries, cookies, pizza crust, and pasta (made from white flour & egg whites) - vegetarian pasta or spinach or wheat pasta is OK. Multigrain breads like Arnold's or Pepperidge Farm, or multigrain sandwich thins or flatbreads.  Diet, exercise and weight loss can reverse and cure diabetes in the early stages.  Diet, exercise and weight loss is very important in the control and prevention of complications of diabetes which affects every system in your body, ie. Brain - dementia/stroke, eyes - glaucoma/blindness, heart - heart attack/heart failure, kidneys - dialysis, stomach - gastric paralysis, intestines - malabsorption, nerves - severe painful neuritis, circulation - gangrene & loss of a leg(s), and finally cancer and Alzheimers.    I recommend avoid fried & greasy foods,  sweets/candy, white rice (brown or wild rice or Quinoa is OK), white potatoes (sweet potatoes are OK) - anything made from white flour - bagels, doughnuts, rolls, buns, biscuits,white and wheat breads, pizza crust and traditional pasta made of white flour & egg white(vegetarian pasta or spinach or wheat pasta is OK).  Multi-grain bread is OK - like multi-grain flat bread or sandwich thins. Avoid alcohol in excess. Exercise is also important.    Eat all the vegetables you want - avoid meat, especially red meat and dairy - especially cheese.  Cheese is  the most concentrated form of trans-fats which is the worst thing to clog up our arteries. Veggie cheese is OK which can be found in the fresh produce section at [facility] or [employer] or [name], Male       [name]:  A routine yearly physical is a good way to check in with your primary care Stephen Holland about your health and preventive screening. It is also an opportunity to share updates about your health and any concerns you have, and receive a thorough all-over exam.   Most health insurance companies pay for at least some preventative services.  Check with your health plan for specific coverages.  WHAT PREVENTATIVE SERVICES DO MEN NEED?  Adult men should have their weight and blood pressure checked regularly.   Men age 19 and older should have their cholesterol levels checked regularly.  Beginning at age 63 and continuing to age 15, men should be screened for colorectal cancer.  Certain people should may need continued testing until age 66.  Other cancer screening may include exams for testicular and prostate cancer.  Updating vaccinations is part of preventative care.  Vaccinations help protect against diseases such as the flu.  Lab tests are generally done as part of preventative care to screen for anemia and blood disorders, to screen for problems with the kidneys and liver, to screen for bladder problems, to check blood sugar, and to check your cholesterol level.  Preventative services generally include counseling about diet, exercise, avoiding tobacco,  drugs, excessive alcohol consumption, and sexually transmitted infections.    GENERAL RECOMMENDATIONS FOR GOOD HEALTH:  Healthy diet:  Eat a variety of foods, including fruit, vegetables, animal or vegetable protein, such as meat, fish, chicken, and eggs, or beans, lentils, tofu, and grains, such as rice.  Drink plenty of water daily.  Decrease saturated fat in the diet, avoid  lots of red meat, processed foods, sweets, fast foods, and fried foods.  Exercise:  Aerobic exercise helps maintain good heart health. At least 30-40 minutes of moderate-intensity exercise is recommended. For example, a brisk walk that increases your heart rate and breathing. This should be done on most days of the week.   Find a type of exercise or a variety of exercises that you enjoy so that it becomes a part of your daily life.  Examples are running, walking, swimming, water aerobics, and biking.  For motivation and support, explore group exercise such as aerobic class, spin class, Zumba, Yoga,or  martial arts, etc.    Set exercise goals for yourself, such as a certain weight goal, walk or run in a race such as a 5k walk/run.  Speak to your primary care Stephen Holland about exercise goals.  Disease prevention:  If you smoke or chew tobacco, find out from your caregiver how to quit. It can literally save your life, no matter how long you have been a tobacco user. If you do not use tobacco, never begin.   Maintain a healthy diet and normal weight. Increased weight leads to problems with blood pressure and diabetes.   The Body Mass Index or BMI is a way of measuring how much of your body is fat. Having a BMI above 27 increases the risk of heart disease, diabetes, hypertension, stroke and other problems related to obesity. Your caregiver can help determine your BMI and based on it develop an exercise and dietary program to help you achieve or maintain this important measurement at a healthful level.  High blood pressure causes heart and blood vessel problems.  Persistent high blood pressure should be treated with medicine if weight loss and exercise do not work.   Fat and cholesterol leaves deposits in your arteries that can block them. This causes heart disease and vessel disease elsewhere in your body.  If your cholesterol is found to be high, or if you have heart disease or certain other medical  conditions, then you may need to have your cholesterol monitored frequently and be treated with medication.   Ask if you should have a stress test if your history suggests this. A stress test is a test done on a treadmill that looks for heart disease. This test can find disease prior to there being a problem.  Avoid drinking alcohol in excess (more than two drinks per day).  Avoid use of street drugs. Do not share needles with anyone. Ask for professional help if you need assistance or instructions on stopping the use of alcohol, cigarettes, and/or drugs.  Brush your teeth twice a day with fluoride toothpaste, and floss once a day. Good oral hygiene prevents tooth decay and gum disease. The problems can be painful, unattractive, and can cause other health problems. Visit your dentist for a routine oral and dental check up and preventive care every 6-12 months.   Look at your skin regularly.  Use a mirror to look at your back. Notify your caregivers of changes in moles, especially if there are changes in shapes, colors, a size larger than a pencil  eraser, an irregular border, or development of new moles.  Safety:  Use seatbelts 100% of the time, whether driving or as a passenger.  Use safety devices such as hearing protection if you work in environments with loud noise or significant background noise.  Use safety glasses when doing any work that could send debris in to the eyes.  Use a helmet if you ride a bike or motorcycle.  Use appropriate safety gear for contact sports.  Talk to your caregiver about gun safety.  Use sunscreen with a SPF (or skin protection factor) of 15 or greater.  Lighter skinned people are at a greater risk of skin cancer. Don't forget to also wear sunglasses in order to protect your eyes from too much damaging sunlight. Damaging sunlight can accelerate cataract formation.   Practice safe sex. Use condoms. Condoms are used for birth control and to help reduce the spread of  sexually transmitted infections (or STIs).  Some of the STIs are gonorrhea (the clap), chlamydia, syphilis, trichomonas, herpes, HPV (human papilloma virus) and HIV (human immunodeficiency virus) which causes AIDS. The herpes, HIV and HPV are viral illnesses that have no cure. These can result in disability, cancer and death.   Keep carbon monoxide and smoke detectors in your home functioning at all times. Change the batteries every 6 months or use a model that plugs into the wall.   Vaccinations:  Stay up to date with your tetanus shots and other required immunizations. You should have a booster for tetanus every 10 years. Be sure to get your flu shot every year, since 5%-20% of the [place] population comes down with the flu. The flu vaccine changes each year, so being vaccinated once is not enough. Get your shot in the fall, before the flu season peaks.   Other vaccines to consider:  Pneumococcal vaccine to protect against certain types of pneumonia.  This is normally recommended for adults age 11 or older.  However, adults younger than 63 years old with certain underlying conditions such as diabetes, heart or lung disease should also receive the vaccine.  Shingles vaccine to protect against Varicella Zoster if you are older than age 44, or younger than 63 years old with certain underlying illness.  Hepatitis A vaccine to protect against a form of infection of the liver by a virus acquired from food.  Hepatitis B vaccine to protect against a form of infection of the liver by a virus acquired from blood or body fluids, particularly if you work in health care.  If you plan to travel internationally, check with your local health department for specific vaccination recommendations.  Cancer Screening:  Most routine colon cancer screening begins at the age of 55. On a yearly basis, doctors may provide special easy to use take-home tests to check for hidden blood in the stool. Sigmoidoscopy or  colonoscopy can detect the earliest forms of colon cancer and is life saving. These tests use a small camera at the end of a tube to directly examine the colon. Speak to your caregiver about this at age 53, when routine screening begins (and is repeated every 5 years unless early forms of pre-cancerous polyps or small growths are found).   At the age of 50 men usually start screening for prostate cancer every year. Screening may begin at a younger age for those with higher risk. Those at higher risk include African-Americans or having a family history of prostate cancer. There are two types of tests for prostate cancer:  Prostate-specific antigen (PSA) testing. Recent studies raise questions about prostate cancer using PSA and you should discuss this with your caregiver.   Digital rectal exam (in which your doctor's lubricated and gloved finger feels for enlargement of the prostate through the anus).   Screening for testicular cancer.  Do a monthly exam of your testicles. Gently roll each testicle between your thumb and fingers, feeling for any abnormal lumps. The best time to do this is after a hot shower or bath when the tissues are looser. Notify your caregivers of any lumps, tenderness or changes in size or shape immediately.

## 2014-05-24 LAB — URINALYSIS, MICROSCOPIC ONLY
Bacteria, UA: NONE SEEN
Casts: NONE SEEN
Crystals: NONE SEEN
Squamous Epithelial / LPF: NONE SEEN

## 2014-05-24 LAB — HEPATITIS B SURFACE ANTIBODY,QUALITATIVE: Hep B S Ab: NEGATIVE

## 2014-05-24 LAB — VITAMIN D 25 HYDROXY (VIT D DEFICIENCY, FRACTURES): Vit D, 25-Hydroxy: 88 ng/mL (ref 30–89)

## 2014-05-24 LAB — HEPATITIS B CORE ANTIBODY, TOTAL: Hep B Core Total Ab: NONREACTIVE

## 2014-05-24 LAB — INSULIN, FASTING: Insulin fasting, serum: 31 u[IU]/mL — ABNORMAL HIGH (ref 3–28)

## 2014-05-24 LAB — HEPATITIS A ANTIBODY, TOTAL: Hep A Total Ab: NONREACTIVE

## 2014-05-24 LAB — MICROALBUMIN / CREATININE URINE RATIO
Creatinine, Urine: 163.6 mg/dL
Microalb Creat Ratio: 60.3 mg/g — ABNORMAL HIGH (ref 0.0–30.0)
Microalb, Ur: 9.86 mg/dL — ABNORMAL HIGH (ref 0.00–1.89)

## 2014-05-24 LAB — TESTOSTERONE: Testosterone: 386 ng/dL (ref 300–890)

## 2014-05-24 LAB — HEPATITIS C ANTIBODY: HCV Ab: NEGATIVE

## 2014-05-24 LAB — PSA: PSA: 0.13 ng/mL (ref <=4.00)

## 2014-05-27 ENCOUNTER — Ambulatory Visit (HOSPITAL_COMMUNITY)
Admission: RE | Admit: 2014-05-27 | Discharge: 2014-05-27 | Disposition: A | Discharge status: Home / Self Care | Payer: Managed Care, Other (non HMO) | Source: Ambulatory Visit | Attending: Internal Medicine | Admitting: Internal Medicine

## 2014-05-27 DIAGNOSIS — I10 Essential (primary) hypertension: Secondary | ICD-10-CM

## 2014-05-27 DIAGNOSIS — Z87891 Personal history of nicotine dependence: Secondary | ICD-10-CM | POA: Insufficient documentation

## 2014-05-27 LAB — TB SKIN TEST
Induration: 0 mm
TB Skin Test: NEGATIVE

## 2014-05-27 LAB — HEPATITIS B E ANTIBODY: Hepatitis Be Antibody: NONREACTIVE

## 2014-06-05 ENCOUNTER — Other Ambulatory Visit (INDEPENDENT_AMBULATORY_CARE_PROVIDER_SITE_OTHER): Payer: Managed Care, Other (non HMO)

## 2014-06-05 DIAGNOSIS — Z1212 Encounter for screening for malignant neoplasm of rectum: Secondary | ICD-10-CM

## 2014-06-05 LAB — POC HEMOCCULT BLD/STL (HOME/3-CARD/SCREEN)
Card #2 Fecal Occult Blod, POC: NEGATIVE
Card #3 Fecal Occult Blood, POC: NEGATIVE
Fecal Occult Blood, POC: NEGATIVE

## 2014-06-11 ENCOUNTER — Other Ambulatory Visit: Payer: Self-pay | Admitting: Internal Medicine

## 2014-07-24 ENCOUNTER — Other Ambulatory Visit: Payer: Self-pay | Admitting: Internal Medicine

## 2014-08-16 ENCOUNTER — Other Ambulatory Visit: Payer: Managed Care, Other (non HMO)

## 2014-08-17 ENCOUNTER — Other Ambulatory Visit: Payer: Self-pay | Admitting: Emergency Medicine

## 2014-08-19 ENCOUNTER — Other Ambulatory Visit: Payer: Managed Care, Other (non HMO)

## 2014-08-26 ENCOUNTER — Encounter: Payer: Self-pay | Admitting: Physician Assistant

## 2014-08-26 ENCOUNTER — Ambulatory Visit (INDEPENDENT_AMBULATORY_CARE_PROVIDER_SITE_OTHER): Payer: Managed Care, Other (non HMO) | Admitting: Physician Assistant

## 2014-08-26 VITALS — BP 110/68 | HR 68 | Temp 97.9°F | Resp 16 | Ht 67.5 in | Wt 208.0 lb

## 2014-08-26 DIAGNOSIS — E669 Obesity, unspecified: Secondary | ICD-10-CM

## 2014-08-26 DIAGNOSIS — Z23 Encounter for immunization: Secondary | ICD-10-CM

## 2014-08-26 DIAGNOSIS — M109 Gout, unspecified: Secondary | ICD-10-CM

## 2014-08-26 DIAGNOSIS — Z87891 Personal history of nicotine dependence: Secondary | ICD-10-CM | POA: Insufficient documentation

## 2014-08-26 DIAGNOSIS — Z79899 Other long term (current) drug therapy: Secondary | ICD-10-CM

## 2014-08-26 DIAGNOSIS — E785 Hyperlipidemia, unspecified: Secondary | ICD-10-CM

## 2014-08-26 DIAGNOSIS — I10 Essential (primary) hypertension: Secondary | ICD-10-CM

## 2014-08-26 DIAGNOSIS — Z720 Tobacco use: Secondary | ICD-10-CM | POA: Insufficient documentation

## 2014-08-26 DIAGNOSIS — R7309 Other abnormal glucose: Secondary | ICD-10-CM

## 2014-08-26 DIAGNOSIS — F17200 Nicotine dependence, unspecified, uncomplicated: Secondary | ICD-10-CM

## 2014-08-26 DIAGNOSIS — E559 Vitamin D deficiency, unspecified: Secondary | ICD-10-CM

## 2014-08-26 DIAGNOSIS — R7303 Prediabetes: Secondary | ICD-10-CM

## 2014-08-26 NOTE — Patient Instructions (Signed)
Lyrica is a medication for nerve pain. Start 1-2 pills at night for 1-2 weeks, can add a pill at lunch for a week or two and then can add one in the morning IF NEEDED. The generic of this medication is called gabapentin, it is the same as the lyrica but takes about 2-3 hours to work.     Bad carbs also include fruit juice, alcohol, and sweet tea. These are empty calories that do not signal to your brain that you are full.   Please remember the good carbs are still carbs which convert into sugar. So please measure them out no more than 1/2-1 cup of rice, oatmeal, pasta, and beans.  Veggies are however free foods! Pile them on.   I like lean protein at every meal such as chicken, [place], pork chops, cottage cheese, etc. Just do not fry these meats and please center your meal around vegetable, the meats should be a side dish.   No all fruit is created equal. Please see the list below, the fruit at the bottom is higher in sugars than the fruit at the top

## 2014-08-26 NOTE — Progress Notes (Signed)
Assessment and Plan:  Hypertension: Continue medication, monitor blood pressure at home. Continue DASH diet. Cholesterol: Continue diet and exercise. Check cholesterol.  Pre-diabetes-Continue diet and exercise. Check A1C Vitamin D Def- check level and continue medications. Obesity with co morbidities- long discussion about weight loss, diet, and exercise Gout- recheck Uric acid as needed, Diet discussed, continue medications. Smoker- not ready to quit at this time Back/neck pain with radiculopathy- try lyrica samples  Continue diet and meds as discussed. Further disposition pending results of labs.  HPI 63 y.o. male  presents for 3 month follow up with hypertension, hyperlipidemia, prediabetes and vitamin D. His blood pressure has been controlled at home, today their BP is BP: 110/68 mmHg He does not workout. He denies chest pain, shortness of breath, dizziness.  He is on cholesterol medication and denies myalgias. His cholesterol is at goal. The cholesterol last visit was:   Lab Results  Component Value Date   CHOL 137 05/23/2014   HDL 37* 05/23/2014   LDLCALC 48 05/23/2014   TRIG 261* 05/23/2014   CHOLHDL 3.7 05/23/2014   He has been working on diet and exercise for prediabetes, and denies paresthesia of the feet, polydipsia and polyuria. Last A1C in the office was:  Lab Results  Component Value Date   HGBA1C 5.8* 05/23/2014   Patient is on Vitamin D supplement.   Lab Results  Component Value Date   VD25OH 88 05/23/2014     BMI is Body mass index is 32.08 kg/(m^2)., he is doing well with weight loss, he is on phentermine.  Wt Readings from Last 3 Encounters:  08/26/14 208 lb (94.348 kg)  05/23/14 217 lb 3.2 oz (98.521 kg)  02/11/14 216 lb (97.977 kg)   He has had some lower back pain with ESI that has helped, still has some numbness tingling in right foot. Has had MRI with Dr. Ellene Route.  Patient is on allopurinol for gout and does not report a recent flare.   Current Medications:   Current Outpatient Prescriptions on File Prior to Visit  Medication Sig Dispense Refill  . allopurinol (ZYLOPRIM) 300 MG tablet TAKE ONE TABLET BY MOUTH ONCE DAILY  90 tablet  1  . aspirin 81 MG tablet Take 81 mg by mouth daily.      . bisoprolol-hydrochlorothiazide (ZIAC) 10-6.25 MG per tablet TAKE ONE TABLET BY MOUTH ONCE DAILY FOR BLOOD PRESSURE  90 tablet  1  . Cholecalciferol (VITAMIN D PO) Take 5,000 Units by mouth 2 (two) times daily.      . clonazePAM (KLONOPIN) 1 MG tablet TAKE ONE TABLET BY MOUTH AT BEDTIME  30 tablet  3  . CRESTOR 40 MG tablet TAKE 1 TABLET BY MOUTH EVERY DAY  90 tablet  0  . Krill Oil 1000 MG CAPS Take by mouth.      . losartan-hydrochlorothiazide (HYZAAR) 100-25 MG per tablet TAKE 1 TABLET BY MOUTH DAILY.  90 tablet  1  . Magnesium 400 MG CAPS Take by mouth. Takes 800 mg daily      . meloxicam (MOBIC) 15 MG tablet Take 15 mg by mouth daily.      . phentermine (ADIPEX-P) 37.5 MG tablet 1/2 to 1 tablet every morning for diet and weight loss  30 tablet  3   No current facility-administered medications on file prior to visit.   Medical History:  Past Medical History  Diagnosis Date  . Hypertension   . Hyperlipemia   . Prediabetes   . Obstructive sleep apnea  08/2012  . Vitamin D deficiency   . DJD (degenerative joint disease)   . Gout   . Prostate cancer     s/p prostatectomy  . ED (erectile dysfunction)   . Colon polyps    Allergies:  Allergies  Allergen Reactions  . Lidocaine     blisters     Review of Systems: [X]  = complains of  [ ]  = denies  General: Fatigue [ ]  Fever [ ]  Chills [ ]  Weakness [ ]   Insomnia [ ]  Eyes: Redness [ ]  Blurred vision [ ]  Diplopia [ ]   ENT: Congestion [ ]  Sinus Pain [ ]  Post Nasal Drip [ ]  Sore Throat [ ]  Earache [ ]   Cardiac: Chest pain/pressure [ ]  SOB [ ]  Orthopnea [ ]   Palpitations [ ]   Paroxysmal nocturnal dyspnea[ ]  Claudication [ ]  Edema [ ]   Pulmonary: Cough [ ]  Wheezing[ ]   SOB [ ]   Snoring [ ]   GI:  Nausea [ ]  Vomiting[ ]  Dysphagia[ ]  Heartburn[ ]  Abdominal pain [ ]  Constipation [ ] ; Diarrhea [ ] ; BRBPR [ ]  Melena[ ]  GU: Hematuria[ ]  Dysuria [ ]  Nocturia[ ]  Urgency [ ]   Hesitancy [ ]  Discharge [ ]  Neuro: Headaches[ ]  Vertigo[ ]  Paresthesias[x ] Spasm [ ]  Speech changes [ ]  Incoordination [ ]   Ortho: Arthritis [ ]  Joint pain [x ] Muscle pain [ ]  Joint swelling [ ]  Back Pain [x ] Skin:  Rash [ ]   Pruritis [ ]  Change in skin lesion [ ]   Psych: Depression[ ]  Anxiety[ ]  Confusion [ ]  Memory loss [ ]   Heme/Lypmh: Bleeding [ ]  Bruising [ ]  Enlarged lymph nodes [ ]   Endocrine: Visual blurring [ ]  Paresthesia [ ]  Polyuria [ ]  Polydypsea [ ]    Heat/cold intolerance [ ]  Hypoglycemia [ ]   Family history- Review and unchanged Social history- Review and unchanged Physical Exam: BP 110/68  Pulse 68  Temp(Src) 97.9 F (36.6 C)  Resp 16  Ht 5' 7.5" (1.715 m)  Wt 208 lb (94.348 kg)  BMI 32.08 kg/m2 Wt Readings from Last 3 Encounters:  08/26/14 208 lb (94.348 kg)  05/23/14 217 lb 3.2 oz (98.521 kg)  02/11/14 216 lb (97.977 kg)   General Appearance: Well nourished, in no apparent distress. Eyes: PERRLA, EOMs, conjunctiva no swelling or erythema Sinuses: No Frontal/maxillary tenderness ENT/Mouth: Ext aud canals clear, TMs without erythema, bulging. No erythema, swelling, or exudate on post pharynx.  Tonsils not swollen or erythematous. Hearing normal.  Neck: Supple, thyroid normal.  Respiratory: Respiratory effort normal, BS equal bilaterally without rales, rhonchi, wheezing or stridor.  Cardio: RRR with no MRGs. Brisk peripheral pulses without edema.  Abdomen: Soft, + BS,obese  Non tender, no guarding, rebound, hernias, masses. Lymphatics: Non tender without lymphadenopathy.  Musculoskeletal: Full ROM, 5/5 strength, normal gait.  Skin: Warm, dry without rashes, lesions, ecchymosis.  Neuro: Cranial nerves intact. Normal muscle tone, no cerebellar symptoms. Sensation intact.  Psych: Awake and  oriented X 3, normal affect, Insight and Judgment appropriate.    Vicie Mutters 1:37 PM

## 2014-08-27 LAB — CBC WITH DIFFERENTIAL/PLATELET
Basophils Absolute: 0.1 10*3/uL (ref 0.0–0.1)
Basophils Relative: 1 % (ref 0–1)
Eosinophils Absolute: 0.1 10*3/uL (ref 0.0–0.7)
Eosinophils Relative: 1 % (ref 0–5)
HCT: 48.5 % (ref 39.0–52.0)
Hemoglobin: 17.2 g/dL — ABNORMAL HIGH (ref 13.0–17.0)
Lymphocytes Relative: 31 % (ref 12–46)
Lymphs Abs: 2.6 10*3/uL (ref 0.7–4.0)
MCH: 33 pg (ref 26.0–34.0)
MCHC: 35.5 g/dL (ref 30.0–36.0)
MCV: 92.9 fL (ref 78.0–100.0)
Monocytes Absolute: 0.6 10*3/uL (ref 0.1–1.0)
Monocytes Relative: 7 % (ref 3–12)
Neutro Abs: 5 10*3/uL (ref 1.7–7.7)
Neutrophils Relative %: 60 % (ref 43–77)
Platelets: 200 10*3/uL (ref 150–400)
RBC: 5.22 MIL/uL (ref 4.22–5.81)
RDW: 14.7 % (ref 11.5–15.5)
WBC: 8.4 10*3/uL (ref 4.0–10.5)

## 2014-08-27 LAB — HEPATIC FUNCTION PANEL
ALT: 26 U/L (ref 0–53)
AST: 21 U/L (ref 0–37)
Albumin: 4.1 g/dL (ref 3.5–5.2)
Alkaline Phosphatase: 66 U/L (ref 39–117)
Bilirubin, Direct: 0.1 mg/dL (ref 0.0–0.3)
Indirect Bilirubin: 0.4 mg/dL (ref 0.2–1.2)
Total Bilirubin: 0.5 mg/dL (ref 0.2–1.2)
Total Protein: 6.7 g/dL (ref 6.0–8.3)

## 2014-08-27 LAB — BASIC METABOLIC PANEL WITH GFR
BUN: 21 mg/dL (ref 6–23)
CO2: 28 mEq/L (ref 19–32)
Calcium: 9.8 mg/dL (ref 8.4–10.5)
Chloride: 103 mEq/L (ref 96–112)
Creat: 1.01 mg/dL (ref 0.50–1.35)
GFR, Est African American: >89 mL/min
GFR, Est Non African American: 79 mL/min
Glucose, Bld: 83 mg/dL (ref 70–99)
Potassium: 4.6 mEq/L (ref 3.5–5.3)
Sodium: 143 mEq/L (ref 135–145)

## 2014-08-27 LAB — VITAMIN D 25 HYDROXY (VIT D DEFICIENCY, FRACTURES): Vit D, 25-Hydroxy: 94 ng/mL — ABNORMAL HIGH (ref 30–89)

## 2014-08-27 LAB — LIPID PANEL
Cholesterol: 125 mg/dL (ref 0–200)
HDL: 38 mg/dL — ABNORMAL LOW (ref >39)
LDL Cholesterol: 37 mg/dL (ref 0–99)
Total CHOL/HDL Ratio: 3.3 Ratio
Triglycerides: 251 mg/dL — ABNORMAL HIGH (ref <150)
VLDL: 50 mg/dL — ABNORMAL HIGH (ref 0–40)

## 2014-08-27 LAB — TSH: TSH: 0.994 u[IU]/mL (ref 0.350–4.500)

## 2014-08-27 LAB — INSULIN, FASTING: Insulin fasting, serum: 24.9 u[IU]/mL — ABNORMAL HIGH (ref 2.0–19.6)

## 2014-08-27 LAB — MAGNESIUM: Magnesium: 1.8 mg/dL (ref 1.5–2.5)

## 2014-08-27 LAB — HEMOGLOBIN A1C
Hgb A1c MFr Bld: 5.7 % — ABNORMAL HIGH (ref <5.7)
Mean Plasma Glucose: 117 mg/dL — ABNORMAL HIGH (ref <117)

## 2014-09-03 ENCOUNTER — Other Ambulatory Visit: Payer: Self-pay | Admitting: Emergency Medicine

## 2014-09-03 ENCOUNTER — Other Ambulatory Visit: Payer: Self-pay | Admitting: Physician Assistant

## 2014-09-03 ENCOUNTER — Other Ambulatory Visit: Payer: Self-pay | Admitting: Internal Medicine

## 2014-11-26 ENCOUNTER — Other Ambulatory Visit: Payer: Self-pay | Admitting: Emergency Medicine

## 2014-11-26 ENCOUNTER — Other Ambulatory Visit: Payer: Self-pay | Admitting: Internal Medicine

## 2014-12-03 ENCOUNTER — Encounter: Payer: Self-pay | Admitting: Internal Medicine

## 2014-12-03 ENCOUNTER — Ambulatory Visit (INDEPENDENT_AMBULATORY_CARE_PROVIDER_SITE_OTHER): Payer: Managed Care, Other (non HMO) | Admitting: Internal Medicine

## 2014-12-03 VITALS — BP 124/76 | HR 56 | Temp 98.1°F | Resp 16 | Ht 67.0 in | Wt 210.0 lb

## 2014-12-03 DIAGNOSIS — E785 Hyperlipidemia, unspecified: Secondary | ICD-10-CM

## 2014-12-03 DIAGNOSIS — M1000 Idiopathic gout, unspecified site: Secondary | ICD-10-CM

## 2014-12-03 DIAGNOSIS — Z79899 Other long term (current) drug therapy: Secondary | ICD-10-CM

## 2014-12-03 DIAGNOSIS — I10 Essential (primary) hypertension: Secondary | ICD-10-CM

## 2014-12-03 DIAGNOSIS — E559 Vitamin D deficiency, unspecified: Secondary | ICD-10-CM

## 2014-12-03 DIAGNOSIS — R7303 Prediabetes: Secondary | ICD-10-CM

## 2014-12-03 DIAGNOSIS — Z024 Encounter for examination for driving license: Secondary | ICD-10-CM | POA: Insufficient documentation

## 2014-12-03 DIAGNOSIS — R7309 Other abnormal glucose: Secondary | ICD-10-CM

## 2014-12-03 MED ORDER — LOSARTAN POTASSIUM-HCTZ 100-25 MG PO TABS: 1.0000 | ORAL_TABLET | Freq: Every day | ORAL | Status: DC

## 2014-12-03 NOTE — Patient Instructions (Signed)

## 2014-12-03 NOTE — Progress Notes (Signed)
Patient ID: Stephen Holland, male   DOB: Sep 30, 1951, 64 y.o.   MRN: [id number]   This very nice 64 y.o.MWM presents for 3 month follow up with Hypertension, Hyperlipidemia, Pre-Diabetes and Vitamin D Deficiency.    Patient is treated for HTN & BP has been controlled at home. Today's BP: 124/76 mmHg. Patient has had no complaints of any cardiac type chest pain, palpitations, dyspnea/orthopnea/PND, dizziness, claudication, or dependent edema.   Hyperlipidemia is controlled with diet & meds. Patient denies myalgias or other med SE's. Last Lipids were at goal -  Total Chol 125; HDL  38*; LDL 37; with elevated Triglycerides 251 on 08/26/2014.   Also, the patient has history of PreDiabetes and has had no symptoms of reactive hypoglycemia, diabetic polys, paresthesias or visual blurring.  Last A1c was 08/26/2014: Hemoglobin-A1c 5.7% on     Further, the patient also has history of Vitamin D Deficiency and supplements vitamin D without any suspected side-effects. Last vitamin D was 08/26/2014: Vit D, 25-Hydroxy 94 on    Medication Sig  . allopurinol (ZYLOPRIM) 300 MG tablet TAKE ONE TABLET BY MOUTH ONCE DAILY  . aspirin 81 MG tablet Take 81 mg by mouth daily.  . bisoprolol-hydrochlorothiazide (ZIAC) 10-6.25 MG per tablet TAKE ONE TABLET BY MOUTH ONCE DAILY FOR BLOOD PRESSURE  . Cholecalciferol (VITAMIN D PO) Take 5,000 Units by mouth 2 (two) times daily.  . clonazePAM (KLONOPIN) 1 MG tablet TAKE ONE TABLET BY MOUTH AT BEDTIME  . CRESTOR 40 MG tablet TAKE 1 TABLET BY MOUTH EVERY DAY  . Magnesium 400 MG CAPS Take by mouth. Takes 800 mg daily  . meloxicam (MOBIC) 15 MG tablet TAKE ONE TABLET BY MOUTH EVERY DAY WITH FOOD FOR ARTHRITIS PAIN AND INFLAMMATION  . losartan-hydrochlorothiazide (HYZAAR) 100-25 MG per tablet TAKE 1 TABLET BY MOUTH DAILY.  Marland Kitchen losartan-hydrochlorothiazide (HYZAAR) 100-25 MG per tablet TAKE 1 TABLET BY MOUTH DAILY.  Marland Kitchen phentermine (ADIPEX-P) 37.5 MG tablet 1/2 to 1 tablet every morning for  diet and weight loss (Patient not taking: Reported on 12/03/2014)  . Krill Oil 1000 MG CAPS Take by mouth.   Allergies  Allergen Reactions  . Lidocaine     blisters   PMHx:   Past Medical History  Diagnosis Date  . Hypertension   . Hyperlipemia   . Prediabetes   . Obstructive sleep apnea     08/2012  . Vitamin D deficiency   . DJD (degenerative joint disease)   . Gout   . Prostate cancer     s/p prostatectomy  . ED (erectile dysfunction)   . Colon polyps    Immunization History  Administered Date(s) Administered  . Influenza Split 08/26/2014  . PPD Test 05/23/2014  . Pneumococcal Conjugate-13 08/26/2014   Past Surgical History  Procedure Laterality Date  . Prostatectomy  2003   FHx:    Reviewed / unchanged  SHx:    Reviewed / unchanged  Systems Review:  Constitutional: Denies fever, chills, wt changes, headaches, insomnia, fatigue, night sweats, change in appetite. Eyes: Denies redness, blurred vision, diplopia, discharge, itchy, watery eyes.  ENT: Denies discharge, congestion, post nasal drip, epistaxis, sore throat, earache, hearing loss, dental pain, tinnitus, vertigo, sinus pain, snoring.  CV: Denies chest pain, palpitations, irregular heartbeat, syncope, dyspnea, diaphoresis, orthopnea, PND, claudication or edema. Respiratory: denies cough, dyspnea, DOE, pleurisy, hoarseness, laryngitis, wheezing.  Gastrointestinal: Denies dysphagia, odynophagia, heartburn, reflux, water brash, abdominal pain or cramps, nausea, vomiting, bloating, diarrhea, constipation, hematemesis, melena, hematochezia  or hemorrhoids. Genitourinary:  Denies dysuria, frequency, urgency, nocturia, hesitancy, discharge, hematuria or flank pain. Musculoskeletal: Denies myalgias, stiffness, jt. swelling, pain, limping or strain/sprain. Does report recent pains in his Right 1st toe which he feels are clearly distinct and not his gout pains.  Skin: Denies pruritus, rash, hives, warts, acne, eczema or  change in skin lesion(s). Neuro: No weakness, tremor, incoordination, spasms, paresthesia or pain. Psychiatric: Denies confusion, memory loss or sensory loss. Endo: Denies change in weight, skin or hair change.  Heme/Lymph: No excessive bleeding, bruising or enlarged lymph nodes.  Physical Exam  BP 124/76   Pulse 56  Temp  98.1 F  Resp 16  Ht 5\' 7"    Wt 210 lb   BMI 32.88   Appears well nourished and in no distress. Eyes: PERRLA, EOMs, conjunctiva no swelling or erythema. Sinuses: No frontal/maxillary tenderness ENT/Mouth: EAC's clear, TM's nl w/o erythema, bulging. Nares clear w/o erythema, swelling, exudates. Oropharynx clear without erythema or exudates. Oral hygiene is good. Tongue normal, non obstructing. Hearing intact.  Neck: Supple. Thyroid nl. Car 2+/2+ without bruits, nodes or JVD. Chest: Respirations nl with BS clear & equal w/o rales, rhonchi, wheezing or stridor.  Cor: Heart sounds normal w/ regular rate and rhythm without sig. murmurs, gallops, clicks, or rubs. Peripheral pulses normal and equal  without edema.  Abdomen: Soft & bowel sounds normal. Non-tender w/o guarding, rebound, hernias, masses, or organomegaly.  Lymphatics: Unremarkable.  Musculoskeletal: Full ROM all peripheral extremities, joint stability, 5/5 strength, and normal gait.  Skin: Warm, dry without exposed rashes, lesions or ecchymosis apparent.  Neuro: Cranial nerves intact, reflexes equal bilaterally. Sensory-motor testing grossly intact. Tendon reflexes grossly intact.  Pysch: Alert & oriented x 3.  Insight and judgement nl & appropriate. No ideations.  Assessment and Plan:  1. Hypertension - Continue monitor blood pressure at home. Continue diet/meds same.  2. Hyperlipidemia - Continue diet/meds, exercise,& lifestyle modifications. Continue monitor periodic cholesterol/liver & renal functions   3. Pre-Diabetes - Continue diet, exercise, lifestyle modifications. Monitor appropriate labs.  4.  Vitamin D Deficiency - Continue supplementation.  5. Gout -    Recommended regular exercise, BP monitoring, weight control, and discussed med and SE's. Recommended labs to assess and monitor clinical status. Further disposition pending results of labs.

## 2014-12-04 LAB — BASIC METABOLIC PANEL WITH GFR
BUN: 30 mg/dL — ABNORMAL HIGH (ref 6–23)
CO2: 31 mEq/L (ref 19–32)
Calcium: 9.3 mg/dL (ref 8.4–10.5)
Chloride: 103 mEq/L (ref 96–112)
Creat: 1.15 mg/dL (ref 0.50–1.35)
GFR, Est African American: 78 mL/min
GFR, Est Non African American: 67 mL/min
Glucose, Bld: 124 mg/dL — ABNORMAL HIGH (ref 70–99)
Potassium: 4.5 mEq/L (ref 3.5–5.3)
Sodium: 140 mEq/L (ref 135–145)

## 2014-12-04 LAB — HEPATIC FUNCTION PANEL
ALT: 30 U/L (ref 0–53)
AST: 23 U/L (ref 0–37)
Albumin: 4.2 g/dL (ref 3.5–5.2)
Alkaline Phosphatase: 73 U/L (ref 39–117)
Bilirubin, Direct: 0.1 mg/dL (ref 0.0–0.3)
Indirect Bilirubin: 0.4 mg/dL (ref 0.2–1.2)
Total Bilirubin: 0.5 mg/dL (ref 0.2–1.2)
Total Protein: 6.9 g/dL (ref 6.0–8.3)

## 2014-12-04 LAB — LIPID PANEL
Cholesterol: 134 mg/dL (ref 0–200)
HDL: 40 mg/dL (ref >39)
LDL Cholesterol: 39 mg/dL (ref 0–99)
Total CHOL/HDL Ratio: 3.4 Ratio
Triglycerides: 277 mg/dL — ABNORMAL HIGH (ref <150)
VLDL: 55 mg/dL — ABNORMAL HIGH (ref 0–40)

## 2014-12-04 LAB — CBC WITH DIFFERENTIAL/PLATELET
Basophils Absolute: 0.1 10*3/uL (ref 0.0–0.1)
Basophils Relative: 1 % (ref 0–1)
Eosinophils Absolute: 0.1 10*3/uL (ref 0.0–0.7)
Eosinophils Relative: 1 % (ref 0–5)
HCT: 49.7 % (ref 39.0–52.0)
Hemoglobin: 17 g/dL (ref 13.0–17.0)
Lymphocytes Relative: 21 % (ref 12–46)
Lymphs Abs: 2.3 10*3/uL (ref 0.7–4.0)
MCH: 33.1 pg (ref 26.0–34.0)
MCHC: 34.2 g/dL (ref 30.0–36.0)
MCV: 96.7 fL (ref 78.0–100.0)
MPV: 11 fL (ref 8.6–12.4)
Monocytes Absolute: 0.6 10*3/uL (ref 0.1–1.0)
Monocytes Relative: 5 % (ref 3–12)
Neutro Abs: 7.9 10*3/uL — ABNORMAL HIGH (ref 1.7–7.7)
Neutrophils Relative %: 72 % (ref 43–77)
Platelets: 189 10*3/uL (ref 150–400)
RBC: 5.14 MIL/uL (ref 4.22–5.81)
RDW: 13.3 % (ref 11.5–15.5)
WBC: 11 10*3/uL — ABNORMAL HIGH (ref 4.0–10.5)

## 2014-12-04 LAB — VITAMIN D 25 HYDROXY (VIT D DEFICIENCY, FRACTURES): Vit D, 25-Hydroxy: 59 ng/mL (ref 30–100)

## 2014-12-04 LAB — INSULIN, FASTING: Insulin fasting, serum: 137.4 u[IU]/mL — ABNORMAL HIGH (ref 2.0–19.6)

## 2014-12-04 LAB — HEMOGLOBIN A1C
Hgb A1c MFr Bld: 5.8 % — ABNORMAL HIGH (ref <5.7)
Mean Plasma Glucose: 120 mg/dL — ABNORMAL HIGH (ref <117)

## 2014-12-04 LAB — TSH: TSH: 1.019 u[IU]/mL (ref 0.350–4.500)

## 2014-12-04 LAB — URIC ACID: Uric Acid, Serum: 5.6 mg/dL (ref 4.0–7.8)

## 2014-12-04 LAB — MAGNESIUM: Magnesium: 2 mg/dL (ref 1.5–2.5)

## 2015-02-17 ENCOUNTER — Other Ambulatory Visit: Payer: Self-pay | Admitting: Physician Assistant

## 2015-02-17 ENCOUNTER — Other Ambulatory Visit: Payer: Self-pay | Admitting: Internal Medicine

## 2015-02-17 DIAGNOSIS — G4700 Insomnia, unspecified: Secondary | ICD-10-CM

## 2015-03-17 ENCOUNTER — Encounter: Payer: Self-pay | Admitting: Physician Assistant

## 2015-03-17 ENCOUNTER — Ambulatory Visit (INDEPENDENT_AMBULATORY_CARE_PROVIDER_SITE_OTHER): Payer: Managed Care, Other (non HMO) | Admitting: Physician Assistant

## 2015-03-17 VITALS — BP 146/90 | HR 88 | Temp 98.4°F | Resp 18 | Ht 67.0 in | Wt 208.0 lb

## 2015-03-17 DIAGNOSIS — M109 Gout, unspecified: Secondary | ICD-10-CM

## 2015-03-17 DIAGNOSIS — Z79899 Other long term (current) drug therapy: Secondary | ICD-10-CM

## 2015-03-17 DIAGNOSIS — R7309 Other abnormal glucose: Secondary | ICD-10-CM

## 2015-03-17 DIAGNOSIS — Z720 Tobacco use: Secondary | ICD-10-CM

## 2015-03-17 DIAGNOSIS — I10 Essential (primary) hypertension: Secondary | ICD-10-CM

## 2015-03-17 DIAGNOSIS — E559 Vitamin D deficiency, unspecified: Secondary | ICD-10-CM | POA: Diagnosis not present

## 2015-03-17 DIAGNOSIS — M791 Myalgia, unspecified site: Secondary | ICD-10-CM

## 2015-03-17 DIAGNOSIS — E669 Obesity, unspecified: Secondary | ICD-10-CM

## 2015-03-17 DIAGNOSIS — R7303 Prediabetes: Secondary | ICD-10-CM

## 2015-03-17 DIAGNOSIS — F17200 Nicotine dependence, unspecified, uncomplicated: Secondary | ICD-10-CM

## 2015-03-17 DIAGNOSIS — E785 Hyperlipidemia, unspecified: Secondary | ICD-10-CM | POA: Diagnosis not present

## 2015-03-17 LAB — CBC WITH DIFFERENTIAL/PLATELET
Basophils Absolute: 0 10*3/uL (ref 0.0–0.1)
Basophils Relative: 0 % (ref 0–1)
Eosinophils Absolute: 0.1 10*3/uL (ref 0.0–0.7)
Eosinophils Relative: 1 % (ref 0–5)
HCT: 50.8 % (ref 39.0–52.0)
Hemoglobin: 17.8 g/dL — ABNORMAL HIGH (ref 13.0–17.0)
Lymphocytes Relative: 30 % (ref 12–46)
Lymphs Abs: 2.8 10*3/uL (ref 0.7–4.0)
MCH: 33.5 pg (ref 26.0–34.0)
MCHC: 35 g/dL (ref 30.0–36.0)
MCV: 95.7 fL (ref 78.0–100.0)
MPV: 11 fL (ref 8.6–12.4)
Monocytes Absolute: 0.8 10*3/uL (ref 0.1–1.0)
Monocytes Relative: 9 % (ref 3–12)
Neutro Abs: 5.5 10*3/uL (ref 1.7–7.7)
Neutrophils Relative %: 60 % (ref 43–77)
Platelets: 148 10*3/uL — ABNORMAL LOW (ref 150–400)
RBC: 5.31 MIL/uL (ref 4.22–5.81)
RDW: 13.9 % (ref 11.5–15.5)
WBC: 9.2 10*3/uL (ref 4.0–10.5)

## 2015-03-17 NOTE — Patient Instructions (Signed)
Before you even begin to attack a weight-loss plan, it pays to remember this: You are not fat. You have fat. Losing weight isn't about blame or shame; it's simply another achievement to accomplish. Dieting is like any other skill-you have to buckle down and work at it. As long as you act in a smart, reasonable way, you'll ultimately get where you want to be. Here are some weight loss pearls for you.  1. It's Not a Diet. It's a Lifestyle Thinking of a diet as something you're on and suffering through only for the short term doesn't work. To shed weight and keep it off, you need to make permanent changes to the way you eat. It's OK to indulge occasionally, of course, but if you cut calories temporarily and then revert to your old way of eating, you'll gain back the weight quicker than you can say yo-yo. Use it to lose it. Research shows that one of the best predictors of long-term weight loss is how many pounds you drop in the first month. For that reason, nutritionists often suggest being stricter for the first two weeks of your new eating strategy to build momentum. Cut out added sugar and alcohol and avoid unrefined carbs. After that, figure out how you can reincorporate them in a way that's healthy and maintainable.  2. There's a Right Way to Exercise Working out burns calories and fat and boosts your metabolism by building muscle. But those trying to lose weight are notorious for overestimating the number of calories they burn and underestimating the amount they take in. Unfortunately, your system is biologically programmed to hold on to extra pounds and that means when you start exercising, your body senses the deficit and ramps up its hunger signals. If you're not diligent, you'll eat everything you burn and then some. Use it to lose it. Cardio gets all the exercise glory, but strength and interval training are the real heroes. They help you build lean muscle, which in turn increases your metabolism and  calorie-burning ability 3. Don't Overreact to Mild Hunger Some people have a hard time losing weight because of hunger anxiety. To them, being hungry is bad-something to be avoided at all costs-so they carry snacks with them and eat when they don't need to. Others eat because they're stressed out or bored. While you never want to get to the point of being ravenous (that's when bingeing is likely to happen), a hunger pang, a craving, or the fact that it's 3:00 p.m. should not send you racing for the vending machine or obsessing about the energy bar in your purse. Ideally, you should put off eating until your stomach is growling and it's difficult to concentrate.  Use it to lose it. When you feel the urge to eat, use the HALT method. Ask yourself, Am I really hungry? Or am I angry or anxious, lonely or bored, or tired? If you're still not certain, try the apple test. If you're truly hungry, an apple should seem delicious; if it doesn't, something else is going on. Or you can try drinking water and making yourself busy, if you are still hungry try a healthy snack.  4. Not All Calories Are Created Equal The mechanics of weight loss are pretty simple: Take in fewer calories than you use for energy. But the kind of food you eat makes all the difference. Processed food that's high in saturated fat and refined starch or sugar can cause inflammation that disrupts the hormone signals that tell  your brain you're full. The result: You eat a lot more.  Use it to lose it. Clean up your diet. Swap in whole, unprocessed foods, including vegetables, lean protein, and healthy fats that will fill you up and give you the biggest nutritional bang for your calorie buck. In a few weeks, as your brain starts receiving regular hunger and fullness signals once again, you'll notice that you feel less hungry overall and naturally start cutting back on the amount you eat.  5. Protein, Produce, and Plant-Based Fats Are Your Weight-Loss  [place] Here's why eating the three Ps regularly will help you drop pounds. Protein fills you up. You need it to build lean muscle, which keeps your metabolism humming so that you can torch more fat. People in a weight-loss program who ate double the recommended daily allowance for protein (about 110 grams for a 150-pound woman) lost 70 percent of their weight from fat, while people who ate the RDA lost only about 40 percent, one study found. Produce is packed with filling fiber. "It's very difficult to consume too many calories if you're eating a lot of vegetables. Example: Three cups of broccoli is a lot of food, yet only 93 calories. (Fruit is another story. It can be easy to overeat and can contain a lot of calories from sugar, so be sure to monitor your intake.) Plant-based fats like olive oil and those in avocados and nuts are healthy and extra satiating.  Use it to lose it. Aim to incorporate each of the three Ps into every meal and snack. People who eat protein throughout the day are able to keep weight off, according to a study in the American Journal of Clinical Nutrition. In addition to meat, poultry and seafood, good sources are beans, lentils, eggs, tofu, and yogurt. As for fat, keep portion sizes in check by measuring out salad dressing, oil, and nut butters (shoot for one to two tablespoons). Finally, eat veggies or a little fruit at every meal. People who did that consumed 308 fewer calories but didn't feel any hungrier than when they didn't eat more produce.  7. How You Eat Is As Important As What You Eat In order for your brain to register that you're full, you need to focus on what you're eating. Sit down whenever you eat, preferably at a table. Turn off the TV or computer, put down your phone, and look at your food. Smell it. Chew slowly, and don't put another bite on your fork until you swallow. When women ate lunch this attentively, they consumed 30 percent less when snacking later than  those who listened to an audiobook at lunchtime, according to a study in the British Journal of Nutrition. 8. Weighing Yourself Really Works The scale provides the best evidence about whether your efforts are paying off. Seeing the numbers tick up or down or stagnate is motivation to keep going-or to rethink your approach. A 2015 study at [employer] found that daily weigh-ins helped people lose more weight, keep it off, and maintain that loss, even after two years. Use it to lose it. Step on the scale at the same time every day for the best results. If your weight shoots up several pounds from one weigh-in to the next, don't freak out. Eating a lot of salt the night before or having your period is the likely culprit. The number should return to normal in a day or two. It's a steady climb that you need to do something about.   9. Too Much Stress and Too Little Sleep Are Your Enemies When you're tired and frazzled, your body cranks up the production of cortisol, the stress hormone that can cause carb cravings. Not getting enough sleep also boosts your levels of ghrelin, a hormone associated with hunger, while suppressing leptin, a hormone that signals fullness and satiety. People on a diet who slept only five and a half hours a night for two weeks lost 55 percent less fat and were hungrier than those who slept eight and a half hours, according to a study in the [place]. Use it to lose it. Prioritize sleep, aiming for seven hours or more a night, which research shows helps lower stress. And make sure you're getting quality zzz's. If a snoring spouse or a fidgety cat wakes you up frequently throughout the night, you may end up getting the equivalent of just four hours of sleep, according to a study from [facility]. Keep pets out of the bedroom, and use a white-noise app to drown out snoring. 10. You Will Hit a plateau-And You Can Bust Through It As you slim down, your  body releases much less leptin, the fullness hormone.  If you're not strength training, start right now. Building muscle can raise your metabolism to help you overcome a plateau. To keep your body challenged and burning calories, incorporate new moves and more intense intervals into your workouts or add another sweat session to your weekly routine. Alternatively, cut an extra 100 calories or so a day from your diet. Now that you've lost weight, your body simply doesn't need as much fuel.   Smoking Cessation, Tips for Success If you are ready to quit smoking, congratulations! You have chosen to help yourself be healthier. Cigarettes bring nicotine, tar, carbon monoxide, and other irritants into your body. Your lungs, heart, and blood vessels will be able to work better without these poisons. There are many different ways to quit smoking. Nicotine gum, nicotine patches, a nicotine inhaler, or nicotine nasal spray can help with physical craving. Hypnosis, support groups, and medicines help break the habit of smoking. WHAT THINGS CAN I DO TO MAKE QUITTING EASIER?  Here are some tips to help you quit for good:  Pick a date when you will quit smoking completely. Tell all of your friends and family about your plan to quit on that date.  Do not try to slowly cut down on the number of cigarettes you are smoking. Pick a quit date and quit smoking completely starting on that day.  Throw away all cigarettes.   Clean and remove all ashtrays from your home, work, and car.  On a card, write down your reasons for quitting. Carry the card with you and read it when you get the urge to smoke.  Cleanse your body of nicotine. Drink enough water and fluids to keep your urine clear or pale yellow. Do this after quitting to flush the nicotine from your body.  Learn to predict your moods. Do not let a bad situation be your excuse to have a cigarette. Some situations in your life might tempt you into wanting a  cigarette.  Never have "just one" cigarette. It leads to wanting another and another. Remind yourself of your decision to quit.  Change habits associated with smoking. If you smoked while driving or when feeling stressed, try other activities to replace smoking. Stand up when drinking your coffee. Brush your teeth after eating. Sit in a different chair when  you read the paper. Avoid alcohol while trying to quit, and try to drink fewer caffeinated beverages. Alcohol and caffeine may urge you to smoke.  Avoid foods and drinks that can trigger a desire to smoke, such as sugary or spicy foods and alcohol.  Ask people who smoke not to smoke around you.  Have something planned to do right after eating or having a cup of coffee. For example, plan to take a walk or exercise.  Try a relaxation exercise to calm you down and decrease your stress. Remember, you may be tense and nervous for the first 2 weeks after you quit, but this will pass.  Find new activities to keep your hands busy. Play with a pen, coin, or rubber band. Doodle or draw things on paper.  Brush your teeth right after eating. This will help cut down on the craving for the taste of tobacco after meals. You can also try mouthwash.   Use oral substitutes in place of cigarettes. Try using lemon drops, carrots, cinnamon sticks, or chewing gum. Keep them handy so they are available when you have the urge to smoke.  When you have the urge to smoke, try deep breathing.  Designate your home as a nonsmoking area.  If you are a heavy smoker, ask your health care provider about a prescription for nicotine chewing gum. It can ease your withdrawal from nicotine.  Reward yourself. Set aside the cigarette money you save and buy yourself something nice.  Look for support from others. Join a support group or smoking cessation program. Ask someone at home or at work to help you with your plan to quit smoking.  Always ask yourself, "Do I need this  cigarette or is this just a reflex?" Tell yourself, "Today, I choose not to smoke," or "I do not want to smoke." You are reminding yourself of your decision to quit.  Do not replace cigarette smoking with electronic cigarettes (commonly called e-cigarettes). The safety of e-cigarettes is unknown, and some may contain harmful chemicals.  If you relapse, do not give up! Plan ahead and think about what you will do the next time you get the urge to smoke. HOW WILL I FEEL WHEN I QUIT SMOKING? You may have symptoms of withdrawal because your body is used to nicotine (the addictive substance in cigarettes). You may crave cigarettes, be irritable, feel very hungry, cough often, get headaches, or have difficulty concentrating. The withdrawal symptoms are only temporary. They are strongest when you first quit but will go away within 10-14 days. When withdrawal symptoms occur, stay in control. Think about your reasons for quitting. Remind yourself that these are signs that your body is healing and getting used to being without cigarettes. Remember that withdrawal symptoms are easier to treat than the major diseases that smoking can cause.  Even after the withdrawal is over, expect periodic urges to smoke. However, these cravings are generally short lived and will go away whether you smoke or not. Do not smoke! WHAT RESOURCES ARE AVAILABLE TO HELP ME QUIT SMOKING? Your health care provider can direct you to community resources or hospitals for support, which may include:  Group support.  Education.  Hypnosis.  Therapy. Document Released: 08/13/2004 Document Revised: 04/01/2014 Document Reviewed: 05/03/2013 [facility] Patient Information 2015 [place], [place]. This information is not intended to replace advice given to you by your health care provider. Make sure you discuss any questions you have with your health care provider.

## 2015-03-17 NOTE — Progress Notes (Signed)
Assessment and Plan:  1. Hypertension -Continue medication, monitor blood pressure at home. Continue DASH diet.  Reminder to go to the ER if any CP, SOB, nausea, dizziness, severe HA, changes vision/speech, left arm numbness and tingling and jaw pain.  2. Cholesterol -Continue diet and exercise. Check cholesterol.   3. Prediabetes  -Continue diet and exercise. Check A1C  4. Vitamin D Def - check level and continue medications.   5. Obesity with co morbidities-  long discussion about weight loss, diet, and exercise  6. Leg cramps No claudication, good distal pulses, at night, denies restless feeling- will check electrolytes, CPK, anemia  7. Smoking cessation-   instruction/counseling given, counseled patient on the dangers of tobacco use, advised patient to stop smoking, and reviewed strategies to maximize success, patient not ready to quit at this time.   Continue diet and meds as discussed. Further disposition pending results of labs. Over 30 minutes of exam, counseling, chart review, and critical decision making was performed  HPI 64 y.o. male  presents for 3 month follow up on hypertension, cholesterol, prediabetes, and vitamin D deficiency.   His blood pressure has been controlled at home, he will monitor at home, today their BP is BP: (!) 146/90 mmHg  BP Readings from Last 3 Encounters:  03/17/15 146/90  12/03/14 124/76  08/26/14 110/68    He does not workout. He denies chest pain, shortness of breath, dizziness.  He is on cholesterol medication, crestor 40mg  and does have occ cramping in his legs at night, not with walking.Marland Kitchen His cholesterol is at goal. The cholesterol last visit was:   Lab Results  Component Value Date   CHOL 134 12/03/2014   HDL 40 12/03/2014   LDLCALC 39 12/03/2014   TRIG 277* 12/03/2014   CHOLHDL 3.4 12/03/2014   He has been working on diet and exercise for prediabetes, and denies paresthesia of the feet, polydipsia, polyuria and visual  disturbances. Last A1C in the office was:  Lab Results  Component Value Date   HGBA1C 5.8* 12/03/2014  Patient is on Vitamin D supplement.   Lab Results  Component Value Date   VD25OH 69 12/03/2014   He continues to smoke and is not ready to quit at this time.  BMI is Body mass index is 32.57 kg/(m^2)., he is working on diet and exercise. Wt Readings from Last 3 Encounters:  03/17/15 208 lb (94.348 kg)  12/03/14 210 lb (95.255 kg)  08/26/14 208 lb (94.348 kg)     Current Medications:  Current Outpatient Prescriptions on File Prior to Visit  Medication Sig Dispense Refill  . allopurinol (ZYLOPRIM) 300 MG tablet TAKE ONE TABLET BY MOUTH ONCE DAILY 90 tablet 1  . bisoprolol-hydrochlorothiazide (ZIAC) 10-6.25 MG per tablet TAKE ONE TABLET BY MOUTH ONCE DAILY FOR BLOOD PRESSURE 90 tablet 0  . Cholecalciferol (VITAMIN D PO) Take 5,000 Units by mouth 2 (two) times daily.    . clonazePAM (KLONOPIN) 1 MG tablet TAKE ONE TABLET BY MOUTH AT BEDTIME 30 tablet 5  . CRESTOR 40 MG tablet TAKE 1 TABLET BY MOUTH EVERY DAY 90 tablet 0  . KRILL OIL PO Take 350 mg by mouth 3 (three) times daily.    Marland Kitchen losartan-hydrochlorothiazide (HYZAAR) 100-25 MG per tablet Take 1 tablet by mouth daily. 90 tablet 1  . Magnesium 400 MG CAPS Take by mouth. Takes 800 mg daily    . meloxicam (MOBIC) 15 MG tablet TAKE ONE TABLET BY MOUTH EVERY DAY WITH FOOD FOR ARTHRITIS PAIN  AND INFLAMMATION 90 tablet 99   No current facility-administered medications on file prior to visit.   Medical History:  Past Medical History  Diagnosis Date  . Hypertension   . Hyperlipemia   . Prediabetes   . Obstructive sleep apnea     08/2012  . Vitamin D deficiency   . DJD (degenerative joint disease)   . Gout   . Prostate cancer     s/p prostatectomy  . ED (erectile dysfunction)   . Colon polyps    Allergies:  Allergies  Allergen Reactions  . Lidocaine     blisters     Review of Systems:  Review of Systems  Constitutional:  Negative.   HENT: Negative.   Eyes: Negative.   Respiratory: Negative.   Cardiovascular: Negative.   Gastrointestinal: Negative.   Genitourinary: Negative.   Musculoskeletal: Positive for back pain (follows with ortho) and joint pain (right shoulder, due to cortisteriod injection ). Negative for myalgias, falls and neck pain.  Skin: Negative.   Neurological: Positive for tingling and sensory change (right hand from shoulder and right leg from SI).  Endo/Heme/Allergies: Negative.   Psychiatric/Behavioral: Negative.     Family history- Review and unchanged Social history- Review and unchanged Physical Exam: BP 146/90 mmHg  Pulse 88  Temp(Src) 98.4 F (36.9 C) (Temporal)  Resp 18  Ht 5\' 7"  (1.702 m)  Wt 208 lb (94.348 kg)  BMI 32.57 kg/m2 Wt Readings from Last 3 Encounters:  03/17/15 208 lb (94.348 kg)  12/03/14 210 lb (95.255 kg)  08/26/14 208 lb (94.348 kg)   General Appearance: Well nourished, in no apparent distress. Eyes: PERRLA, EOMs, conjunctiva no swelling or erythema Sinuses: No Frontal/maxillary tenderness ENT/Mouth: Ext aud canals clear, TMs without erythema, bulging. No erythema, swelling, or exudate on post pharynx.  Tonsils not swollen or erythematous. Hearing normal.  Neck: Supple, thyroid normal.  Respiratory: Respiratory effort normal, BS equal bilaterally without rales, rhonchi, wheezing or stridor.  Cardio: RRR with no MRGs. Brisk peripheral pulses without edema.  Abdomen: Soft, obese + BS,  Non tender, no guarding, rebound, hernias, masses. Lymphatics: Non tender without lymphadenopathy.  Musculoskeletal: Full ROM, 5/5 strength, Normal gait Skin: Warm, dry without rashes, lesions, ecchymosis.  Neuro: Cranial nerves intact. Normal muscle tone, no cerebellar symptoms. Psych: Awake and oriented X 3, normal affect, Insight and Judgment appropriate.    Vicie Mutters, PA-C 1:45 PM [facility]

## 2015-03-18 LAB — LIPID PANEL
Cholesterol: 146 mg/dL (ref 0–200)
HDL: 45 mg/dL (ref >=40)
LDL Cholesterol: 60 mg/dL (ref 0–99)
Total CHOL/HDL Ratio: 3.2 Ratio
Triglycerides: 206 mg/dL — ABNORMAL HIGH (ref <150)
VLDL: 41 mg/dL — ABNORMAL HIGH (ref 0–40)

## 2015-03-18 LAB — HEPATIC FUNCTION PANEL
ALT: 25 U/L (ref 0–53)
AST: 23 U/L (ref 0–37)
Albumin: 4.2 g/dL (ref 3.5–5.2)
Alkaline Phosphatase: 63 U/L (ref 39–117)
Bilirubin, Direct: 0.1 mg/dL (ref 0.0–0.3)
Indirect Bilirubin: 0.3 mg/dL (ref 0.2–1.2)
Total Bilirubin: 0.4 mg/dL (ref 0.2–1.2)
Total Protein: 6.8 g/dL (ref 6.0–8.3)

## 2015-03-18 LAB — BASIC METABOLIC PANEL WITH GFR
BUN: 23 mg/dL (ref 6–23)
CO2: 24 mEq/L (ref 19–32)
Calcium: 9.1 mg/dL (ref 8.4–10.5)
Chloride: 104 mEq/L (ref 96–112)
Creat: 0.98 mg/dL (ref 0.50–1.35)
GFR, Est African American: >89 mL/min
GFR, Est Non African American: 81 mL/min
Glucose, Bld: 78 mg/dL (ref 70–99)
Potassium: 4 mEq/L (ref 3.5–5.3)
Sodium: 140 mEq/L (ref 135–145)

## 2015-03-18 LAB — HEMOGLOBIN A1C
Hgb A1c MFr Bld: 5.5 % (ref <5.7)
Mean Plasma Glucose: 111 mg/dL (ref <117)

## 2015-03-18 LAB — CK: Total CK: 84 U/L (ref 7–232)

## 2015-03-18 LAB — INSULIN, FASTING: Insulin fasting, serum: 17.8 u[IU]/mL (ref 2.0–19.6)

## 2015-03-18 LAB — TSH: TSH: 1.115 u[IU]/mL (ref 0.350–4.500)

## 2015-03-18 LAB — MAGNESIUM: Magnesium: 1.9 mg/dL (ref 1.5–2.5)

## 2015-03-19 LAB — VITAMIN D 25 HYDROXY (VIT D DEFICIENCY, FRACTURES): Vit D, 25-Hydroxy: 65 ng/mL (ref 30–100)

## 2015-04-04 ENCOUNTER — Other Ambulatory Visit: Payer: Self-pay | Admitting: Internal Medicine

## 2015-05-01 ENCOUNTER — Other Ambulatory Visit: Payer: Self-pay | Admitting: Internal Medicine

## 2015-05-10 ENCOUNTER — Other Ambulatory Visit: Payer: Self-pay | Admitting: Internal Medicine

## 2015-05-27 ENCOUNTER — Encounter: Payer: Self-pay | Admitting: Internal Medicine

## 2015-07-23 ENCOUNTER — Encounter: Payer: Self-pay | Admitting: Internal Medicine

## 2015-07-23 ENCOUNTER — Other Ambulatory Visit: Payer: Self-pay | Admitting: Internal Medicine

## 2015-07-23 ENCOUNTER — Ambulatory Visit (INDEPENDENT_AMBULATORY_CARE_PROVIDER_SITE_OTHER): Payer: Managed Care, Other (non HMO) | Admitting: Internal Medicine

## 2015-07-23 VITALS — BP 130/82 | HR 64 | Temp 97.4°F | Resp 16 | Ht 67.0 in | Wt 210.4 lb

## 2015-07-23 DIAGNOSIS — Z0000 Encounter for general adult medical examination without abnormal findings: Secondary | ICD-10-CM

## 2015-07-23 DIAGNOSIS — Z79899 Other long term (current) drug therapy: Secondary | ICD-10-CM | POA: Diagnosis not present

## 2015-07-23 DIAGNOSIS — Z125 Encounter for screening for malignant neoplasm of prostate: Secondary | ICD-10-CM

## 2015-07-23 DIAGNOSIS — I10 Essential (primary) hypertension: Secondary | ICD-10-CM

## 2015-07-23 DIAGNOSIS — Z111 Encounter for screening for respiratory tuberculosis: Secondary | ICD-10-CM

## 2015-07-23 DIAGNOSIS — M1000 Idiopathic gout, unspecified site: Secondary | ICD-10-CM

## 2015-07-23 DIAGNOSIS — Z6833 Body mass index (BMI) 33.0-33.9, adult: Secondary | ICD-10-CM

## 2015-07-23 DIAGNOSIS — G4733 Obstructive sleep apnea (adult) (pediatric): Secondary | ICD-10-CM

## 2015-07-23 DIAGNOSIS — E559 Vitamin D deficiency, unspecified: Secondary | ICD-10-CM | POA: Diagnosis not present

## 2015-07-23 DIAGNOSIS — R7309 Other abnormal glucose: Secondary | ICD-10-CM

## 2015-07-23 DIAGNOSIS — E785 Hyperlipidemia, unspecified: Secondary | ICD-10-CM

## 2015-07-23 DIAGNOSIS — Z1212 Encounter for screening for malignant neoplasm of rectum: Secondary | ICD-10-CM

## 2015-07-23 DIAGNOSIS — F17200 Nicotine dependence, unspecified, uncomplicated: Secondary | ICD-10-CM

## 2015-07-23 DIAGNOSIS — R5383 Other fatigue: Secondary | ICD-10-CM

## 2015-07-23 DIAGNOSIS — R7303 Prediabetes: Secondary | ICD-10-CM

## 2015-07-23 LAB — CBC WITH DIFFERENTIAL/PLATELET
Basophils Absolute: 0 10*3/uL (ref 0.0–0.1)
Basophils Relative: 0 % (ref 0–1)
Eosinophils Absolute: 0.1 10*3/uL (ref 0.0–0.7)
Eosinophils Relative: 1 % (ref 0–5)
HCT: 47.3 % (ref 39.0–52.0)
Hemoglobin: 16.7 g/dL (ref 13.0–17.0)
Lymphocytes Relative: 30 % (ref 12–46)
Lymphs Abs: 2.9 10*3/uL (ref 0.7–4.0)
MCH: 33 pg (ref 26.0–34.0)
MCHC: 35.3 g/dL (ref 30.0–36.0)
MCV: 93.5 fL (ref 78.0–100.0)
MPV: 11.3 fL (ref 8.6–12.4)
Monocytes Absolute: 1 10*3/uL (ref 0.1–1.0)
Monocytes Relative: 10 % (ref 3–12)
Neutro Abs: 5.6 10*3/uL (ref 1.7–7.7)
Neutrophils Relative %: 59 % (ref 43–77)
Platelets: 154 10*3/uL (ref 150–400)
RBC: 5.06 MIL/uL (ref 4.22–5.81)
RDW: 13.9 % (ref 11.5–15.5)
WBC: 9.5 10*3/uL (ref 4.0–10.5)

## 2015-07-23 LAB — HEMOGLOBIN A1C
Hgb A1c MFr Bld: 5.5 % (ref <5.7)
Mean Plasma Glucose: 111 mg/dL (ref <117)

## 2015-07-23 NOTE — Progress Notes (Signed)
Patient ID: Stephen Holland, male   DOB: [birth year], 64 y.o.   MRN: [id number]   Comprehensive Examination  This very nice 64 y.o. MWM presents for complete physical.  Patient has been followed for HTN, Prediabetes, Hyperlipidemia, and Vitamin D Deficiency. Patient also has OSA  And is on CPAP 14 cm ([employer]et 5-20) and reports improved restorative sleep and sense of well-being.    HTN predates circa 1980's. Patient's BP has been controlled at home.Today's BP: 130/82 mmHg. Patient denies any cardiac symptoms as chest pain, palpitations, shortness of breath, dizziness or ankle swelling.   Patient's hyperlipidemia is controlled with diet and medications. Patient denies myalgias or other medication SE's. Last lipids were at goal - Cholesterol 146; HDL 45; LDL 60; and elevated Triglycerides 206 on 03/17/2015.   Patient has Morbid Obesity (BMI 33) and consequent  prediabetes with A1c 5.7% in Sept 2011 and patient denies reactive hypoglycemic symptoms, visual blurring, diabetic polys or paresthesias. Last A1c was 5.5% on 03/17/2015.   Finally, patient has history of Vitamin D Deficiency of of "12" in 2009 and last vitamin D was 65 on 03/17/2015.roxy      Medication Sig  . allopurinol  300 MG tablet TAKE ONE TABLET BY MOUTH ONCE DAILY  . bisoprolol-hctz (ZIAC) 10-6.25  TAKE ONE TABLET BY MOUTH ONCE DAILY FOR BLOOD PRESSURE  . VITAMIN D  Take 5,000 Units by mouth 2 (two) times daily.  . clonazePAM (KLONOPIN) 1 MG tablet TAKE ONE TABLET BY MOUTH AT BEDTIME  . CRESTOR 40 MG tablet TAKE 1 TABLET BY MOUTH EVERY DAY  . KRILL OIL PO Take 350 mg by mouth 3 (three) times daily.  . Magnesium 400 MG CAPS Take by mouth. Takes 800 mg daily  . meloxicam 15 MG tablet TAKE ONE TABLET BY MOUTH EVERY DAY WITH FOOD FOR ARTHRITIS PAIN AND INFLAMMATION  . losartan-hyctz (HYZAAR) 100-25 MG  Take 1 tablet by mouth daily.   Allergies  Allergen Reactions  . Lidocaine     blisters   Past Medical History  Diagnosis Date  .  Hypertension   . Hyperlipemia   . Prediabetes   . Obstructive sleep apnea     08/2012  . Vitamin D deficiency   . DJD (degenerative joint disease)   . Gout   . Prostate cancer     s/p prostatectomy  . ED (erectile dysfunction)   . Colon polyps    Health Maintenance  Topic Date Due  . TETANUS/TDAP  01/07/1970  . COLONOSCOPY  01/07/2001  . ZOSTAVAX  01/07/2011  . INFLUENZA VACCINE  06/30/2015  . Hepatitis C Screening  Completed  . HIV Screening  Completed   Immunization History  Administered Date(s) Administered  . Influenza Split 08/26/2014  . PPD Test 05/23/2014  . Pneumococcal Conjugate-13 08/26/2014   Past Surgical History  Procedure Laterality Date  . Prostatectomy  2003   Family History  Problem Relation Age of Onset  . Hypertension Mother    Social History   Social History  . Marital Status: Married    Spouse Name: N/A  . Number of Children: N/A  . Years of Education: N/A   Occupational History  . 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 Smoking status: Current Every Day Smoker -- 1.00 packs/day for 45 years    Types: Cigarettes  . Smokeless tobacco: Never Used  . Alcohol Use: 9.0 oz/week    15  Standard drinks or equivalent per week  . Drug Use: No  . Sexual Activity: Not on file    ROS Constitutional: Denies fever, chills, weight loss/gain, headaches, insomnia,  night sweats or change in appetite. Does c/o fatigue. Eyes: Denies redness, blurred vision, diplopia, discharge, itchy or watery eyes.  ENT: Denies discharge, congestion, post nasal drip, epistaxis, sore throat, earache, hearing loss, dental pain, Tinnitus, Vertigo, Sinus pain or snoring.  Cardio: Denies chest pain, palpitations, irregular heartbeat, syncope, dyspnea, diaphoresis, orthopnea, PND, claudication or edema Respiratory: denies cough, dyspnea, DOE, pleurisy, hoarseness, laryngitis or wheezing.  Gastrointestinal:  Denies dysphagia, heartburn, reflux, water brash, pain, cramps, nausea, vomiting, bloating, diarrhea, constipation, hematemesis, melena, hematochezia, jaundice or hemorrhoids Genitourinary: Denies dysuria, frequency, urgency, nocturia, hesitancy, discharge, hematuria or flank pain Musculoskeletal: c/o pain Rt shoulder, otherwise denies arthralgia, myalgia, stiffness, Jt. Swelling, pain, limp or strain/sprain. Denies Falls. Skin: Denies puritis, rash, hives, warts, acne, eczema or change in skin lesion Neuro: No weakness, tremor, incoordination, spasms, paresthesia or pain Psychiatric: Denies confusion, memory loss or sensory loss. Denies Depression. Endocrine: Denies change in weight, skin, hair change, nocturia, and paresthesia, diabetic polys, visual blurring or hyper / hypo glycemic episodes.  Heme/Lymph: No excessive bleeding, bruising or enlarged lymph nodes.  Physical Exam  BP 130/82   Pulse 64  Temp 97.4 F   Resp 16  Ht 5\' 7"    Wt 210 lb 6.4 oz     BMI 32.95  General Appearance: Over nourished with truncal obesity, in no apparent distress.  Eyes: PERRLA, EOMs, conjunctiva no swelling or erythema, normal fundi and vessels. Sinuses: No frontal/maxillary tenderness ENT/Mouth: EACs patent / TMs  nl. Nares clear without erythema, swelling, mucoid exudates. Oral hygiene is good. No erythema, swelling, or exudate. Tongue normal, non-obstructing. Tonsils not swollen or erythematous. Hearing normal.  Neck: Supple, thyroid normal. No bruits, nodes or JVD. Respiratory: Respiratory effort normal.  BS equal and clear bilateral without rales, rhonci, wheezing or stridor. Cardio: Heart sounds are normal with regular rate and rhythm and no murmurs, rubs or gallops. Peripheral pulses are normal and equal bilaterally without edema. No aortic or femoral bruits. Chest: symmetric with normal excursions and percussion.  Abdomen: Flat, soft, with bowel sounds. Nontender, no guarding, rebound, hernias,  masses, or organomegaly.  Lymphatics: Non tender without lymphadenopathy.  Genitourinary: No hernias.Testes nl. DRE - prostate nl for age - smooth & firm w/o nodules. Musculoskeletal: Tender over apex rt shoulder with decreased int/ext rotation and abduction. Full ROM all peripheral extremities, joint stability, 5/5 strength, and normal gait. Skin: Warm and dry without rashes, lesions, cyanosis, clubbing or  ecchymosis.  Neuro: Cranial nerves intact, reflexes equal bilaterally. Normal muscle tone, no cerebellar symptoms. Sensation intact.  Pysch: Awake and oriented X 3 with normal affect, insight and judgment appropriate.   Assessment and Plan  1. Essential hypertension  - Microalbumin / creatinine urine ratio - EKG 12-Lead - [place], RETROPERITNL ABD,  LTD - TSH - DG Chest 2 View; Future  2. Hyperlipemia  - Lipid panel  3. Prediabetes  - Hemoglobin A1c - Insulin, random  4. Vitamin D deficiency  - Vit D  25 hydroxy  5. Idiopathic gout  - Uric acid  6. Morbid obesity (BMI 33)    7. Obstructive sleep apnea / CPAP   8. Screening for rectal cancer  - POC Hemoccult Bld/Stl   - last colon 2010 (Dr. Mellody Memos) and recc 5 yr f/u - overdue - pt prefers to try Cologard.  9.  Prostate cancer screening  - PSA  10. Other fatigue  - Vitamin B12 - Testosterone - Iron and TIBC - TSH  11. Medication management  - Urine Microscopic - CBC with Differential/Platelet - BASIC METABOLIC PANEL WITH GFR - Hepatic function panel - Magnesium  12. Right shoulder pain  - Empiric trial on Rx Prednisone 20 mg #20 taper    Continue prudent diet as discussed, weight control, BP monitoring, regular exercise, and medications as discussed.  Discussed med effects and SE's. Routine screening labs and tests as requested with regular follow-up as recommended.  Over 40 minutes of exam, counseling &  chart review was performed

## 2015-07-23 NOTE — Patient Instructions (Signed)

## 2015-07-24 LAB — HEPATIC FUNCTION PANEL
ALT: 18 U/L (ref 9–46)
AST: 19 U/L (ref 10–35)
Albumin: 4.2 g/dL (ref 3.6–5.1)
Alkaline Phosphatase: 59 U/L (ref 40–115)
Bilirubin, Direct: 0.2 mg/dL (ref <=0.2)
Indirect Bilirubin: 0.5 mg/dL (ref 0.2–1.2)
Total Bilirubin: 0.7 mg/dL (ref 0.2–1.2)
Total Protein: 6.8 g/dL (ref 6.1–8.1)

## 2015-07-24 LAB — BASIC METABOLIC PANEL WITH GFR
BUN: 25 mg/dL (ref 7–25)
CO2: 24 mmol/L (ref 20–31)
Calcium: 9.5 mg/dL (ref 8.6–10.3)
Chloride: 102 mmol/L (ref 98–110)
Creat: 1.03 mg/dL (ref 0.70–1.25)
GFR, Est African American: 88 mL/min (ref >=60)
GFR, Est Non African American: 76 mL/min (ref >=60)
Glucose, Bld: 78 mg/dL (ref 65–99)
Potassium: 4.3 mmol/L (ref 3.5–5.3)
Sodium: 143 mmol/L (ref 135–146)

## 2015-07-24 LAB — VITAMIN D 25 HYDROXY (VIT D DEFICIENCY, FRACTURES): Vit D, 25-Hydroxy: 65 ng/mL (ref 30–100)

## 2015-07-24 LAB — URIC ACID: Uric Acid, Serum: 5.5 mg/dL (ref 4.0–7.8)

## 2015-07-24 LAB — LIPID PANEL
Cholesterol: 115 mg/dL — ABNORMAL LOW (ref 125–200)
HDL: 42 mg/dL (ref >=40)
LDL Cholesterol: 39 mg/dL (ref <130)
Total CHOL/HDL Ratio: 2.7 Ratio (ref <=5.0)
Triglycerides: 168 mg/dL — ABNORMAL HIGH (ref <150)
VLDL: 34 mg/dL — ABNORMAL HIGH (ref <30)

## 2015-07-24 LAB — URINALYSIS, MICROSCOPIC ONLY
Bacteria, UA: NONE SEEN [HPF]
Casts: NONE SEEN [LPF]
Crystals: NONE SEEN [HPF]
Squamous Epithelial / LPF: NONE SEEN [HPF] (ref <=5)
WBC, UA: NONE SEEN WBC/HPF (ref <=5)
Yeast: NONE SEEN [HPF]

## 2015-07-24 LAB — VITAMIN B12: Vitamin B-12: 861 pg/mL (ref 211–911)

## 2015-07-24 LAB — MICROALBUMIN / CREATININE URINE RATIO
Creatinine, Urine: 128.4 mg/dL
Microalb Creat Ratio: 109 mg/g — ABNORMAL HIGH (ref 0.0–30.0)
Microalb, Ur: 14 mg/dL — ABNORMAL HIGH (ref <2.0)

## 2015-07-24 LAB — TSH: TSH: 0.927 u[IU]/mL (ref 0.350–4.500)

## 2015-07-24 LAB — IRON AND TIBC
%SAT: 29 % (ref 20–55)
Iron: 106 ug/dL (ref 42–165)
TIBC: 364 ug/dL (ref 215–435)
UIBC: 258 ug/dL (ref 125–400)

## 2015-07-24 LAB — PSA: PSA: 0.21 ng/mL (ref <=4.00)

## 2015-07-24 LAB — TESTOSTERONE: Testosterone: 245 ng/dL — ABNORMAL LOW (ref 300–890)

## 2015-07-24 LAB — MAGNESIUM: Magnesium: 1.8 mg/dL (ref 1.5–2.5)

## 2015-07-24 LAB — INSULIN, RANDOM: Insulin: 27.8 u[IU]/mL — ABNORMAL HIGH (ref 2.0–19.6)

## 2015-08-09 ENCOUNTER — Other Ambulatory Visit: Payer: Self-pay | Admitting: Physician Assistant

## 2015-08-11 LAB — COLOGUARD

## 2015-08-12 ENCOUNTER — Other Ambulatory Visit: Payer: Self-pay

## 2015-08-12 DIAGNOSIS — Z1212 Encounter for screening for malignant neoplasm of rectum: Principal | ICD-10-CM

## 2015-08-12 DIAGNOSIS — Z1211 Encounter for screening for malignant neoplasm of colon: Secondary | ICD-10-CM

## 2015-09-22 ENCOUNTER — Other Ambulatory Visit: Payer: Self-pay | Admitting: Internal Medicine

## 2015-10-19 ENCOUNTER — Other Ambulatory Visit: Payer: Self-pay | Admitting: Internal Medicine

## 2015-10-27 ENCOUNTER — Encounter: Payer: Self-pay | Admitting: Internal Medicine

## 2015-10-27 ENCOUNTER — Ambulatory Visit (INDEPENDENT_AMBULATORY_CARE_PROVIDER_SITE_OTHER): Payer: Managed Care, Other (non HMO) | Admitting: Internal Medicine

## 2015-10-27 VITALS — BP 126/70 | HR 64 | Temp 97.8°F | Resp 16 | Ht 67.0 in | Wt 216.0 lb

## 2015-10-27 DIAGNOSIS — E785 Hyperlipidemia, unspecified: Secondary | ICD-10-CM | POA: Diagnosis not present

## 2015-10-27 DIAGNOSIS — I10 Essential (primary) hypertension: Secondary | ICD-10-CM | POA: Diagnosis not present

## 2015-10-27 DIAGNOSIS — Z23 Encounter for immunization: Secondary | ICD-10-CM

## 2015-10-27 DIAGNOSIS — R7303 Prediabetes: Secondary | ICD-10-CM | POA: Diagnosis not present

## 2015-10-27 DIAGNOSIS — E559 Vitamin D deficiency, unspecified: Secondary | ICD-10-CM | POA: Diagnosis not present

## 2015-10-27 DIAGNOSIS — Z79899 Other long term (current) drug therapy: Secondary | ICD-10-CM | POA: Diagnosis not present

## 2015-10-27 LAB — CBC WITH DIFFERENTIAL/PLATELET
Basophils Absolute: 0 10*3/uL (ref 0.0–0.1)
Basophils Relative: 0 % (ref 0–1)
Eosinophils Absolute: 0.2 10*3/uL (ref 0.0–0.7)
Eosinophils Relative: 2 % (ref 0–5)
HCT: 49.6 % (ref 39.0–52.0)
Hemoglobin: 17.1 g/dL — ABNORMAL HIGH (ref 13.0–17.0)
Lymphocytes Relative: 32 % (ref 12–46)
Lymphs Abs: 2.4 10*3/uL (ref 0.7–4.0)
MCH: 32.7 pg (ref 26.0–34.0)
MCHC: 34.5 g/dL (ref 30.0–36.0)
MCV: 94.8 fL (ref 78.0–100.0)
MPV: 11.3 fL (ref 8.6–12.4)
Monocytes Absolute: 0.8 10*3/uL (ref 0.1–1.0)
Monocytes Relative: 10 % (ref 3–12)
Neutro Abs: 4.3 10*3/uL (ref 1.7–7.7)
Neutrophils Relative %: 56 % (ref 43–77)
Platelets: 175 10*3/uL (ref 150–400)
RBC: 5.23 MIL/uL (ref 4.22–5.81)
RDW: 13.9 % (ref 11.5–15.5)
WBC: 7.6 10*3/uL (ref 4.0–10.5)

## 2015-10-27 LAB — BASIC METABOLIC PANEL WITH GFR
BUN: 26 mg/dL — ABNORMAL HIGH (ref 7–25)
CO2: 28 mmol/L (ref 20–31)
Calcium: 9.7 mg/dL (ref 8.6–10.3)
Chloride: 104 mmol/L (ref 98–110)
Creat: 1.03 mg/dL (ref 0.70–1.25)
GFR, Est African American: 88 mL/min (ref >=60)
GFR, Est Non African American: 76 mL/min (ref >=60)
Glucose, Bld: 88 mg/dL (ref 65–99)
Potassium: 4.3 mmol/L (ref 3.5–5.3)
Sodium: 139 mmol/L (ref 135–146)

## 2015-10-27 LAB — LIPID PANEL
Cholesterol: 139 mg/dL (ref 125–200)
HDL: 36 mg/dL — ABNORMAL LOW (ref >=40)
LDL Cholesterol: 39 mg/dL (ref <130)
Total CHOL/HDL Ratio: 3.9 Ratio (ref <=5.0)
Triglycerides: 318 mg/dL — ABNORMAL HIGH (ref <150)
VLDL: 64 mg/dL — ABNORMAL HIGH (ref <30)

## 2015-10-27 LAB — HEPATIC FUNCTION PANEL
ALT: 27 U/L (ref 9–46)
AST: 23 U/L (ref 10–35)
Albumin: 4.2 g/dL (ref 3.6–5.1)
Alkaline Phosphatase: 71 U/L (ref 40–115)
Bilirubin, Direct: 0.1 mg/dL (ref <=0.2)
Indirect Bilirubin: 0.4 mg/dL (ref 0.2–1.2)
Total Bilirubin: 0.5 mg/dL (ref 0.2–1.2)
Total Protein: 6.9 g/dL (ref 6.1–8.1)

## 2015-10-27 LAB — HEMOGLOBIN A1C
Hgb A1c MFr Bld: 5.6 % (ref <5.7)
Mean Plasma Glucose: 114 mg/dL (ref <117)

## 2015-10-27 NOTE — Progress Notes (Signed)
Patient ID: Stephen Holland, male   DOB: 07/29/1951, 64 y.o.   MRN: [id number]  Assessment and Plan:  Hypertension:  -Continue medication,  -monitor blood pressure at home.  -Continue DASH diet.   -Reminder to go to the ER if any CP, SOB, nausea, dizziness, severe HA, changes vision/speech, left arm numbness and tingling, and jaw pain.  Cholesterol: -Continue diet and exercise.  -Check cholesterol.   Pre-diabetes: -Continue diet and exercise.  -Check A1C  Vitamin D Def: -check level -continue medications.   Tobacco Abuse -not ready to quit yet -will let me know when ready to quit  Continue diet and meds as discussed. Further disposition pending results of labs.  HPI 64 y.o. male  presents for 3 month follow up with hypertension, hyperlipidemia, prediabetes and vitamin D.   His blood pressure has been controlled at home, today their BP is BP: 126/70 mmHg.   He does not workout. He denies chest pain, shortness of breath, dizziness.  He reports that he is having some issues with the back and he reports that prevents him from some exercise. He reports that he does do chopping some firewood and deer hunting.     He is on cholesterol medication and denies myalgias. His cholesterol is at goal. The cholesterol last visit was:   Lab Results  Component Value Date   CHOL 115* 07/23/2015   HDL 42 07/23/2015   LDLCALC 39 07/23/2015   TRIG 168* 07/23/2015   CHOLHDL 2.7 07/23/2015     He has been working on diet and exercise for prediabetes, and denies foot ulcerations, hyperglycemia, hypoglycemia , increased appetite, nausea, paresthesia of the feet, polydipsia, polyuria, visual disturbances, vomiting and weight loss. Last A1C in the office was:  Lab Results  Component Value Date   HGBA1C 5.5 07/23/2015    Patient is on Vitamin D supplement.  Lab Results  Component Value Date   VD25OH 16 07/23/2015      He reports that he is having continued numbness from his neck arthritis.     He reports that the gout is under control currently.     Still smoking daily.  Reports that he has family members who have successfully quit but he is not ready to quit yet.   Current Medications:  Current Outpatient Prescriptions on File Prior to Visit  Medication Sig Dispense Refill  . allopurinol (ZYLOPRIM) 300 MG tablet TAKE ONE TABLET BY MOUTH ONCE DAILY 90 tablet 1  . bisoprolol-hydrochlorothiazide (ZIAC) 10-6.25 MG tablet TAKE ONE TABLET BY MOUTH ONCE DAILY FOR BLOOD PRESSURE 90 tablet 1  . Cholecalciferol (VITAMIN D PO) Take 5,000 Units by mouth 2 (two) times daily.    . clonazePAM (KLONOPIN) 1 MG tablet TAKE ONE TABLET BY MOUTH AT BEDTIME 30 tablet 5  . KRILL OIL PO Take 350 mg by mouth 3 (three) times daily.    Marland Kitchen losartan-hydrochlorothiazide (HYZAAR) 100-25 MG per tablet TAKE 1 TABLET BY MOUTH DAILY. 90 tablet 3  . Magnesium 400 MG CAPS Take by mouth. Takes 800 mg daily    . meloxicam (MOBIC) 15 MG tablet TAKE ONE TABLET BY MOUTH ONCE DAILY WITH FOOD FOR ARTHIRITIS PAIN AND INFLAMMATION. 90 tablet 1  . rosuvastatin (CRESTOR) 40 MG tablet TAKE 1 TABLET BY MOUTH EVERY DAY 90 tablet 1   No current facility-administered medications on file prior to visit.    Medical History:  Past Medical History  Diagnosis Date  . Hypertension   . Hyperlipemia   . Prediabetes   .  Obstructive sleep apnea     08/2012  . Vitamin D deficiency   . DJD (degenerative joint disease)   . Gout   . Prostate cancer [facility])     s/p prostatectomy  . ED (erectile dysfunction)   . Colon polyps     Allergies:  Allergies  Allergen Reactions  . Lidocaine     blisters     Review of Systems:  Review of Systems  Constitutional: Negative for fever and chills.  HENT: Positive for congestion. Negative for nosebleeds, sore throat and tinnitus.   Respiratory: Negative for cough, sputum production, shortness of breath and wheezing.   Cardiovascular: Negative for chest pain, palpitations and leg  swelling.  Gastrointestinal: Negative for heartburn, diarrhea, constipation, blood in stool and melena.  Genitourinary: Negative.   Skin: Negative.   Neurological: Positive for sensory change. Negative for dizziness, loss of consciousness and headaches.  Psychiatric/Behavioral: Negative for depression and suicidal ideas. The patient is not nervous/anxious and does not have insomnia.     Family history- Review and unchanged  Social history- Review and unchanged  Physical Exam: BP 126/70 mmHg  Pulse 64  Temp(Src) 97.8 F (36.6 C) (Temporal)  Resp 16  Ht 5\' 7"  (1.702 m)  Wt 216 lb (97.977 kg)  BMI 33.82 kg/m2 Wt Readings from Last 3 Encounters:  10/27/15 216 lb (97.977 kg)  07/23/15 210 lb 6.4 oz (95.437 kg)  03/17/15 208 lb (94.348 kg)    General Appearance: Obese, Well nourished well developed, in no apparent distress. Eyes: PERRLA, EOMs, conjunctiva no swelling or erythema ENT/Mouth: Ear canals normal without obstruction, swelling, erythma, discharge.  TMs normal bilaterally.  Oropharynx moist, clear, without exudate, or postoropharyngeal swelling. Neck: Supple, thyroid normal,no cervical adenopathy  Respiratory: Respiratory effort normal, Breath sounds clear A&P without rhonchi, wheeze, or rale.  No retractions, no accessory usage. Cardio: RRR with no MRGs. Brisk peripheral pulses without edema.  Abdomen: Soft, + BS,  Non tender, no guarding, rebound, hernias, masses. Musculoskeletal: Full ROM, 5/5 strength, Normal gait Skin: Warm, dry without rashes, lesions, ecchymosis.  Neuro: Awake and oriented X 3, Cranial nerves intact. Normal muscle tone, no cerebellar symptoms. Psych: Normal affect, Insight and Judgment appropriate.    [name], PA-C 10:28 AM [facility]

## 2015-10-27 NOTE — Addendum Note (Signed)
Addended by: Tyjuan Demetro A on: 10/27/2015 10:48 AM   Modules accepted: Orders

## 2015-12-02 ENCOUNTER — Encounter: Payer: Self-pay | Admitting: Internal Medicine

## 2015-12-02 ENCOUNTER — Ambulatory Visit (INDEPENDENT_AMBULATORY_CARE_PROVIDER_SITE_OTHER): Payer: Managed Care, Other (non HMO) | Admitting: Internal Medicine

## 2015-12-02 VITALS — BP 112/66 | HR 64 | Temp 97.9°F | Resp 16 | Ht 67.5 in | Wt 212.6 lb

## 2015-12-02 DIAGNOSIS — K612 Anorectal abscess: Secondary | ICD-10-CM | POA: Diagnosis not present

## 2015-12-02 MED ORDER — HYDROCODONE-ACETAMINOPHEN 5-325 MG PO TABS: ORAL_TABLET | ORAL | Status: DC

## 2015-12-02 MED ORDER — CEPHALEXIN 500 MG PO CAPS: ORAL_CAPSULE | ORAL | Status: AC

## 2015-12-02 NOTE — Patient Instructions (Signed)
Perianal Abscess An abscess is an infected area that contains a collection of pus and debris. A perianal abscess is one that occurs in the perineal area, which is the area between the anus and the scrotum in males and between the anus and the vagina in females. Perianal abscesses can vary in size. Without treatment, a perianal abscess can become larger and cause other problems. CAUSES  Glands in the perineal area can become plugged up with debris. When this happens, an abscess may form.  SIGNS AND SYMPTOMS  The most common symptoms of a perianal abscess are:  Swelling and redness in the area of the abscess. The redness may go beyond the abscess and appear as a red streak on the skin.  Pain in the area of the abscess. Other possible symptoms include:   A visible lump or a lump that can be felt when touching the area and is usually painful.  Bleeding or pus-like discharge from the area.  Fever.  General weakness. DIAGNOSIS  Your health care provider will take a medical history and examine the area. This may involve examining the rectal area with a gloved hand (digital rectal exam). For women, it may require a careful vaginal exam. Sometimes, the health care provider needs to look into the rectum using a probe or scope. TREATMENT  Treatment often requires making a cut (incision) in the abscess to drain the pus. This can sometimes be done in your health care provider's office or an emergency department after giving you medicine to numb the area (local anesthetic). For larger or deeper abscesses, surgery may be required to drain the abscess. Antibiotic medicines are sometimes given if there is infection of the surrounding tissue (cellulitis). In some cases, gauze is packed into the abscess to continue draining the area. Frequent sitz baths may be recommended to help the wound heal and to reduce the chance of the abscess coming back. HOME CARE INSTRUCTIONS   Only take over-the-counter or  prescription medicines for pain, fever, or discomfort as directed by your health care provider.  Take antibiotic medicine as directed. Make sure you finish it even if you start to feel better.  If gauze is used in the abscess, follow your health care provider's instructions for removing or changing the gauze. It can usually be removed in 2-3 days.  If one or more drains have been placed in the abscess cavity, be careful not to pull at them. Your health care provider will tell you how long they need to remain in place.  Take warm sitz baths 3-4 times a day and after bowel movements. This will help reduce pain and swelling.  Keep the skin around the abscess clean and dry. Avoid cleaning the area too much.  Avoid scratching the abscess area.  Avoid using colored or perfumed toilet papers. SEEK MEDICAL CARE IF:   You have trouble having a bowel movement or passing urine.  Your pain or swelling in the affected area does not seem to be improving.  The gauze packing or the drains come out before the planned time. SEEK IMMEDIATE MEDICAL CARE IF:   You have problems moving or using your legs.  You have severe or increasing pain.  Your swelling in the affected area suddenly gets worse.  You have a large increase in bleeding or passing of pus.  You have chills or a fever.

## 2015-12-02 NOTE — Progress Notes (Signed)
  Subjective:    Patient ID: Stephen Holland, male    DOB: 28-Sep-1951, 65 y.o.   MRN: [id number]  HPI  This nice 65 yo MWM presents with a 1-2 week hx/o an abscess in his left groin which started draining 3 days ago. Denies fever/chills. Relates he took some penicillin given to him by his dentist for a sinus infection  Medication Sig  . allopurinol (ZYLOPRIM) 300 MG tablet TAKE ONE TABLET BY MOUTH ONCE DAILY  . bisoprolol-hydrochlorothiazide (ZIAC) 10-6.25 MG tablet TAKE ONE TABLET BY MOUTH ONCE DAILY FOR BLOOD PRESSURE  . Cholecalciferol (VITAMIN D PO) Take 5,000 Units by mouth 2 (two) times daily.  . clonazePAM (KLONOPIN) 1 MG tablet TAKE ONE TABLET BY MOUTH AT BEDTIME  . KRILL OIL PO Take 350 mg by mouth 3 (three) times daily.  Marland Kitchen losartan-hydrochlorothiazide (HYZAAR) 100-25 MG per tablet TAKE 1 TABLET BY MOUTH DAILY.  . Magnesium 400 MG CAPS Take by mouth. Takes 800 mg daily  . meloxicam (MOBIC) 15 MG tablet TAKE ONE TABLET BY MOUTH ONCE DAILY WITH FOOD FOR ARTHIRITIS PAIN AND INFLAMMATION.  . rosuvastatin (CRESTOR) 40 MG tablet TAKE 1 TABLET BY MOUTH EVERY DAY   Allergies  Allergen Reactions  . Lidocaine     blisters   Past Medical History  Diagnosis Date  . Hypertension   . Hyperlipemia   . Prediabetes   . Obstructive sleep apnea     08/2012  . Vitamin D deficiency   . DJD (degenerative joint disease)   . Gout   . Prostate cancer [facility])     s/p prostatectomy  . ED (erectile dysfunction)   . Colon polyps    Review of Systems  10 point systems review negative except as above.    Objective:   Physical Exam  BP 112/66 mmHg  Pulse 64  Temp(Src) 97.9 F (36.6 C)  Resp 16  Ht 5' 7.5" (1.715 m)  Wt 212 lb 9.6 oz (96.435 kg)  BMI 32.79 kg/m2  Focused exam shows a draining cyst cavity with a stoma of ~ 1&1/2 cm with minimal putrid drainage located in thr Lt scrotal thigh fold w/o evident surrounding cellulitis/lymphangitis.     Assessment & Plan:   1. Abscess of Perineal   region  - cephALEXin (KEFLEX) 500 MG capsule; Take 1 capsule 4 x day with meals and Bedtime for skin infection  Dispense: 120 capsule; Refill: 0  - HYDROcodone-acetaminophen (NORCO) 5-325 MG tablet; Take 1/2 to 1 tablet every 4 hours if needed for pain.  Dispense: 50 tablet; Refill: 0  - discussed meds/SE's  - Recommended tub soaks 2 x day with 4 oz Clorox.   - ROV 1 mo   - discussed warning signs for expedient re-evaluation.

## 2016-01-05 ENCOUNTER — Ambulatory Visit (INDEPENDENT_AMBULATORY_CARE_PROVIDER_SITE_OTHER): Payer: Medicare HMO | Admitting: Internal Medicine

## 2016-01-05 ENCOUNTER — Encounter: Payer: Self-pay | Admitting: Internal Medicine

## 2016-01-05 VITALS — BP 140/90 | HR 62 | Temp 97.7°F | Resp 16 | Ht 67.5 in | Wt 210.0 lb

## 2016-01-05 DIAGNOSIS — R69 Illness, unspecified: Secondary | ICD-10-CM | POA: Diagnosis not present

## 2016-01-05 DIAGNOSIS — B3749 Other urogenital candidiasis: Secondary | ICD-10-CM | POA: Diagnosis not present

## 2016-01-05 MED ORDER — FLUCONAZOLE 150 MG PO TABS: ORAL_TABLET | ORAL | Status: DC

## 2016-01-05 NOTE — Progress Notes (Signed)
  Subjective:    Patient ID: Stephen Holland, male    DOB: 03-Jun-1951, 65 y.o.   MRN: [id number]  HPI   Patient  Returns for a 1 month f/u of a L scrotal thigh fold draining abcess treated with Doxycycline and tub soaks which has resolved, but now  has a rash of the L scrotum which is nor resolving with OTC creams & powders.   Medication Sig  . allopurinol  300 MG tablet TAKE ONE TABLET BY MOUTH ONCE DAILY  . bisoprolol-hctz (ZIAC) 10-6.25 TAKE ONE TABLET BY MOUTH ONCE DAILY FOR BLOOD PRESSURE  . VITAMIN D Take 5,000 Units by mouth 2 (two) times daily.  . clonazePAM  1 MG tablet TAKE ONE TABLET BY MOUTH AT BEDTIME  . KRILL OIL PO Take 350 mg by mouth 3 (three) times daily.  Marland Kitchen losartan-hctz 100-25 MG TAKE 1 TABLET BY MOUTH DAILY.  . Magnesium 400 MG CAPS Take by mouth. Takes 800 mg daily  . meloxicam  15 MG tablet TAKE ONE TABLET BY MOUTH ONCE DAILY WITH FOOD FOR ARTHIRITIS PAIN AND INFLAMMATION.  . rosuvastatin  40 MG tablet TAKE 1 TABLET BY MOUTH EVERY DAY  . NORCO 5-325 MG tablet Take 1/2 to 1 tablet every 4 hours if needed for pain. (Patient not taking: Reported on 01/05/2016)   Allergies  Allergen Reactions  . Lidocaine     blisters   Past Medical History  Diagnosis Date  . Hypertension   . Hyperlipemia   . Prediabetes   . Obstructive sleep apnea     08/2012  . Vitamin D deficiency   . DJD (degenerative joint disease)   . Gout   . Prostate cancer [facility])     s/p prostatectomy  . ED (erectile dysfunction)   . Colon polyps    Review of Systems 10 point systems review negative except as above.    Objective:   Physical Exam  BP 140/90 mmHg  Pulse 62  Temp(Src) 97.7 F (36.5 C)  Resp 16  Ht 5' 7.5" (1.715 m)  Wt 210 lb (95.255 kg)  BMI 32.39 kg/m2  SpO2 97%  GU focused exam finds an extensive moist velvety pink confluent rash over the L hemi-scrotum and extending to the L proximal medial thigh and up and anterior to the L inguinal area.     Assessment & Plan:   1.  Candidiasis of scrotum  - fluconazole (DIFLUCAN) 150 MG tablet; Take 1 tablet 3 x week on Tues Thurs Sat for Yeast Infection  Dispense: 12 tablet; Refill: 1  - Recc continue tub soaks for 10-15 min 2 x day with a cup of vinegar in a shallow tub.

## 2016-01-05 NOTE — Patient Instructions (Signed)
Candida Infection, Adult A Candida infection (also called yeast, fungus, and Monilia infection) is an overgrowth of yeast that can occur anywhere on the body. A yeast infection commonly occurs in warm, moist body areas. Usually, the infection remains localized but can spread to become a systemic infection. A yeast infection may be a sign of a more severe disease such as diabetes, leukemia, or AIDS. A yeast infection can occur in both men and women. Men usually do not have symptoms or know they have an infection until other problems develop. Men may find out they have a yeast infection because their sex partner has a yeast infection. Uncircumcised men are more likely to get a yeast infection than circumcised men. This is because the uncircumcised glans is not exposed to air and does not remain as dry as that of a circumcised glans. Older adults may develop yeast infections around dentures. CAUSES  Women  Antibiotics.  Steroid medication taken for a long time.  Being overweight (obese).  Diabetes.  Poor immune condition.  Certain serious medical conditions.  Immune suppressive medications for organ transplant patients.  Chemotherapy.  Stress and fatigue.  Intravenous drug use.  Oral contraceptives.  Wearing tight-fitting clothes in the crotch area.  Intravenous, urinary, or other catheters. Men  Spermicide.  Diabetes.  Antibiotics.  Poor immune system.  Medications that suppress the immune system.  Intravenous drug use.  Intravenous, urinary, or other catheters.   SYMPTOMS    Men  Men may develop intestinal problems such as constipation, indigestion, bad breath, bloating, increase in gas, diarrhea, or loose stools.  Dry, cracked skin on the penis with itching or discomfort.  Jock itch.  Dry, flaky skin.  Athlete's foot.  Hypoglycemia. DIAGNOSIS    Men  A history and an exam are performed.  Any discharge from the penis or areas of cracked skin will  be looked at under the microscope and cultured.  Stool samples may be cultured. TREATMENT    Men  Antifungal creams and oral antifungal medications.  Sometimes treatment must continue for 30 days after the symptoms go away to prevent recurrence. HOME CARE INSTRUCTIONS  Women  Use cotton underwear and avoid tight-fitting clothing.  Avoid colored, scented toilet paper and deodorant tampons or pads.  Do not douche.  Keep your diabetes under control.  Finish all the prescribed medications.  Keep your skin clean and dry.  Consume milk or yogurt with Lactobacillus-active culture regularly. If you get frequent yeast infections and think that is what the infection is, there are over-the-counter medications that you can get. If the infection does not show healing in 3 days, talk to your caregiver.  Men  Keep your skin clean and dry.  Keep your diabetes under control.  Finish all prescribed medications.  Tell your sex partner that you have a yeast infection so he or she can be treated if necessary.   SEEK MEDICAL CARE IF:   Your symptoms do not clear up or worsen in one week after treatment.  You have an oral temperature above 102 F (38.9 C).  You have trouble swallowing or eating for a prolonged time.  You develop abdominal pain.  You develop intestinal problems as mentioned above.  You get weak or light-headed.  You have painful or increased urination. MAKE SURE YOU:   Understand these instructions.  Will watch your condition.  Will get help right away if you are not doing well or get worse.

## 2016-01-06 ENCOUNTER — Other Ambulatory Visit: Payer: Self-pay

## 2016-01-06 MED ORDER — ALLOPURINOL 300 MG PO TABS: 300.0000 mg | ORAL_TABLET | Freq: Every day | ORAL | Status: DC

## 2016-01-14 DIAGNOSIS — R69 Illness, unspecified: Secondary | ICD-10-CM | POA: Diagnosis not present

## 2016-01-30 ENCOUNTER — Ambulatory Visit (INDEPENDENT_AMBULATORY_CARE_PROVIDER_SITE_OTHER): Payer: Medicare HMO | Admitting: Internal Medicine

## 2016-01-30 ENCOUNTER — Encounter: Payer: Self-pay | Admitting: Internal Medicine

## 2016-01-30 ENCOUNTER — Ambulatory Visit: Payer: Self-pay | Admitting: Internal Medicine

## 2016-01-30 VITALS — BP 138/64 | HR 60 | Temp 98.0°F | Resp 18 | Ht 67.0 in | Wt 208.0 lb

## 2016-01-30 DIAGNOSIS — R7303 Prediabetes: Secondary | ICD-10-CM | POA: Diagnosis not present

## 2016-01-30 DIAGNOSIS — Z0000 Encounter for general adult medical examination without abnormal findings: Secondary | ICD-10-CM

## 2016-01-30 DIAGNOSIS — F17200 Nicotine dependence, unspecified, uncomplicated: Secondary | ICD-10-CM

## 2016-01-30 DIAGNOSIS — Z79899 Other long term (current) drug therapy: Secondary | ICD-10-CM

## 2016-01-30 DIAGNOSIS — Z0001 Encounter for general adult medical examination with abnormal findings: Secondary | ICD-10-CM

## 2016-01-30 DIAGNOSIS — Z720 Tobacco use: Secondary | ICD-10-CM

## 2016-01-30 DIAGNOSIS — R69 Illness, unspecified: Secondary | ICD-10-CM | POA: Diagnosis not present

## 2016-01-30 DIAGNOSIS — E559 Vitamin D deficiency, unspecified: Secondary | ICD-10-CM | POA: Diagnosis not present

## 2016-01-30 DIAGNOSIS — M1990 Unspecified osteoarthritis, unspecified site: Secondary | ICD-10-CM | POA: Diagnosis not present

## 2016-01-30 DIAGNOSIS — I10 Essential (primary) hypertension: Secondary | ICD-10-CM | POA: Diagnosis not present

## 2016-01-30 DIAGNOSIS — G4733 Obstructive sleep apnea (adult) (pediatric): Secondary | ICD-10-CM

## 2016-01-30 DIAGNOSIS — E785 Hyperlipidemia, unspecified: Secondary | ICD-10-CM | POA: Diagnosis not present

## 2016-01-30 DIAGNOSIS — Z23 Encounter for immunization: Secondary | ICD-10-CM

## 2016-01-30 DIAGNOSIS — M1000 Idiopathic gout, unspecified site: Secondary | ICD-10-CM | POA: Diagnosis not present

## 2016-01-30 DIAGNOSIS — N529 Male erectile dysfunction, unspecified: Secondary | ICD-10-CM

## 2016-01-30 DIAGNOSIS — R6889 Other general symptoms and signs: Secondary | ICD-10-CM | POA: Diagnosis not present

## 2016-01-30 NOTE — Progress Notes (Signed)
Welcome to medicare wellness visit and follow up Assessment:    1. Routine general medical examination at a health care facility -welcome to medicare wellness visit -will not be due again until after 01/29/17  2. Essential hypertension -cont meds -well controlled -Dash diet -monitor at home  3. Obstructive sleep apnea -weight loss recommended -likely secondary to ohs  4. Osteoarthritis, unspecified osteoarthritis type, unspecified site -cont meds prn for pain -diet and exercise  5. Erectile dysfunction, unspecified erectile dysfunction type -meds prn  6. Hyperlipemia -cont crestor  7. Prediabetes -cont diet and exercise  8. Vitamin D deficiency -cont supplement  9. Idiopathic gout, unspecified chronicity, unspecified site -diet -cont allopurinol  10. Morbid obesity, unspecified obesity type ([place]) -diet and exercise  11. Tobacco use disorder -smoking cessation discussed -patient is currently weaning down to quit  12. Medication management -labs a next visit.  EKG and aorta scan both normal.  Next wellness due next year.      Over 30 minutes of exam, counseling, chart review, and critical decision making was performed  Plan:   During the course of the visit the patient was educated and counseled about appropriate screening and preventive services including:    Pneumococcal vaccine   Influenza vaccine  Prevnar 13  Td vaccine  Screening electrocardiogram  Colorectal cancer screening  Diabetes screening  Glaucoma screening  Nutrition counseling   Conditions/risks identified: BMI: Discussed weight loss, diet, and increase physical activity.  Increase physical activity: AHA recommends 150 minutes of physical activity a week.  Medications reviewed Diabetes is at goal, ACE/ARB therapy: Yes. Urinary Incontinence is not an issue: discussed non pharmacology and pharmacology options.  Fall risk: low- discussed PT, home fall assessment,  medications.    Subjective:  Stephen Holland is a 65 y.o. male who presents for Medicare Annual Wellness Visit and 3 month follow up for HTN, hyperlipidemia, prediabetes, and vitamin D Def.  Never had a wellness visit before.  New to medicare.  His blood pressure has been controlled at home, today their BP is BP: 138/64 mmHg He does not workout. He denies chest pain, shortness of breath, dizziness.    He is on cholesterol medication and denies myalgias. His cholesterol is at goal. The cholesterol last visit was:   Lab Results  Component Value Date   CHOL 139 10/27/2015   HDL 36* 10/27/2015   LDLCALC 39 10/27/2015   TRIG 318* 10/27/2015   CHOLHDL 3.9 10/27/2015   He has been working on diet and exercise for prediabetes, and denies foot ulcerations, hyperglycemia, hypoglycemia , increased appetite, nausea, paresthesia of the feet, polydipsia, polyuria, visual disturbances, vomiting and weight loss. Last A1C in the office was:  Lab Results  Component Value Date   HGBA1C 5.6 10/27/2015   Last GFR NonAA   Lab Results  Component Value Date   GFRNONAA 76 10/27/2015   AA  Lab Results  Component Value Date   GFRAA 88 10/27/2015   Patient is on Vitamin D supplement.   Lab Results  Component Value Date   VD25OH 65 07/23/2015      Medication Review: Current Outpatient Prescriptions on File Prior to Visit  Medication Sig Dispense Refill  . allopurinol (ZYLOPRIM) 300 MG tablet Take 1 tablet (300 mg total) by mouth daily. 90 tablet 1  . bisoprolol-hydrochlorothiazide (ZIAC) 10-6.25 MG tablet TAKE ONE TABLET BY MOUTH ONCE DAILY FOR BLOOD PRESSURE 90 tablet 1  . Cholecalciferol (VITAMIN D PO) Take 5,000 Units by mouth 2 (two) times  daily.    . clonazePAM (KLONOPIN) 1 MG tablet TAKE ONE TABLET BY MOUTH AT BEDTIME 30 tablet 5  . KRILL OIL PO Take 350 mg by mouth 3 (three) times daily.    Marland Kitchen losartan-hydrochlorothiazide (HYZAAR) 100-25 MG per tablet TAKE 1 TABLET BY MOUTH DAILY. 90 tablet  3  . Magnesium 400 MG CAPS Take by mouth. Takes 800 mg daily    . meloxicam (MOBIC) 15 MG tablet TAKE ONE TABLET BY MOUTH ONCE DAILY WITH FOOD FOR ARTHIRITIS PAIN AND INFLAMMATION. 90 tablet 1  . rosuvastatin (CRESTOR) 40 MG tablet TAKE 1 TABLET BY MOUTH EVERY DAY 90 tablet 1   No current facility-administered medications on file prior to visit.    Current Problems (verified) Patient Active Problem List   Diagnosis Date Noted  . Medication management 12/03/2014  . Morbid obesity (BMI 33)  08/26/2014  . Tobacco use disorder 08/26/2014  . Hypertension   . Hyperlipemia   . Prediabetes   . Obstructive sleep apnea   . Vitamin D deficiency   . DJD (degenerative joint disease)   . Gout   . ED (erectile dysfunction)     Screening Tests Immunization History  Administered Date(s) Administered  . Influenza Split 08/26/2014  . Influenza, Seasonal, Injecte, Preservative Fre 10/27/2015  . PPD Test 05/23/2014, 07/23/2015  . Pneumococcal Conjugate-13 08/26/2014  . Pneumococcal Polysaccharide-23 05/05/2010  . Td 11/30/2003  . Tdap 08/12/2010  . Zoster 04/29/2013    Preventative care: Last colonoscopy: 2016  Prior vaccinations: TD or Tdap: 2010   Influenza: 2016  Pneumococcal: 2011 Prevnar13: 2015 Shingles/Zostavax: 2014  Names of Other Physician/Practitioners you currently use: 1.  [facility] here for primary care 2. [facility], eye doctor, last visit 2016 3. Dr. Aggie Moats and Kalman Shan, dentist, last visit 2016 Patient Care Team: [name], MD as PCP - General (Internal Medicine)  Past Surgical History  Procedure Laterality Date  . Prostatectomy  2003   Family History  Problem Relation Age of Onset  . Hypertension Mother    Social History  Substance Use Topics  . Smoking status: Current Every Day Smoker -- 1.00 packs/day for 45 years    Types: Cigarettes  . Smokeless tobacco: Never Used  . Alcohol Use: 9.0 oz/week    15  Standard drinks or equivalent per week    MEDICARE WELLNESS OBJECTIVES: Tobacco use: He does smoke.  Patient is a former smoker. If yes, counseling given Alcohol Current alcohol use: none Osteoporosis: hypogonadism, History of fracture in the past year: yes Fall risk: Low Risk Hearing: normal Visual acuity: normal,  does perform annual eye exam Diet: in general, an "unhealthy" diet Physical activity: Current Exercise Habits:: The patient does not participate in regular exercise at present Cardiac risk factors: Cardiac Risk Factors include: advanced age (>54men, >18 women);dyslipidemia;family history of premature cardiovascular disease;hypertension;male gender;obesity (BMI >30kg/m2) Depression/mood screen:   Depression screen [facility] 2/9 01/30/2016  Decreased Interest 0  Down, Depressed, Hopeless 0  PHQ - 2 Score 0    ADLs:  In your present state of health, do you have any difficulty performing the following activities: 01/30/2016 12/02/2015  Hearing? N N  Vision? N N  Difficulty concentrating or making decisions? N N  Walking or climbing stairs? N N  Dressing or bathing? N N  Doing errands, shopping? N N  Preparing Food and eating ? N -  Using the Toilet? N -  In the past six months, have you accidently leaked urine? N -  Do you have problems with loss of bowel control? N -  Managing your Medications? N -  Managing your Finances? N -  Housekeeping or managing your Housekeeping? N -     Cognitive Testing  Alert? Yes  Normal Appearance?Yes  Oriented to person? Yes  Place? Yes   Time? Yes  Recall of three objects?  Yes  Can perform simple calculations? Yes  Displays appropriate judgment?Yes  Can read the correct time from a watch face?Yes  EOL planning: Does patient have an advance directive?: No Would patient like information on creating an advanced directive?: No - patient declined information   Objective:   Today's Vitals   01/30/16 1119  BP: 138/64  Pulse: 60  Temp: 98  F (36.7 C)  TempSrc: Temporal  Resp: 18  Height: 5\' 7"  (1.702 m)  Weight: 208 lb (94.348 kg)   Body mass index is 32.57 kg/(m^2).  General appearance: alert, no distress, WD/WN, male HEENT: normocephalic, sclerae anicteric, TMs pearly, nares patent, no discharge or erythema, pharynx normal Oral cavity: MMM, no lesions Neck: supple, no lymphadenopathy, no thyromegaly, no masses Heart: RRR, normal S1, S2, no murmurs Lungs: CTA bilaterally, no wheezes, rhonchi, or rales Abdomen: +bs, soft, non tender, non distended, no masses, no hepatomegaly, no splenomegaly Musculoskeletal: nontender, no swelling, no obvious deformity Extremities: no edema, no cyanosis, no clubbing Pulses: 2+ symmetric, upper and lower extremities, normal cap refill Neurological: alert, oriented x 3, CN2-12 intact, strength normal upper extremities and lower extremities, sensation normal throughout, DTRs 2+ throughout, no cerebellar signs, gait normal Psychiatric: normal affect, behavior normal, pleasant   Medicare Attestation I have personally reviewed: The patient's medical and social history Their use of alcohol, tobacco or illicit drugs Their current medications and supplements The patient's functional ability including ADLs,fall risks, home safety risks, cognitive, and hearing and visual impairment Diet and physical activities Evidence for depression or mood disorders  The patient's weight, height, BMI, and visual acuity have been recorded in the chart.  I have made referrals, counseling, and provided education to the patient based on review of the above and I have provided the patient with a written personalized care plan for preventive services.     [name], PA-C   01/30/2016

## 2016-02-25 DIAGNOSIS — G4733 Obstructive sleep apnea (adult) (pediatric): Secondary | ICD-10-CM | POA: Diagnosis not present

## 2016-03-23 DIAGNOSIS — L814 Other melanin hyperpigmentation: Secondary | ICD-10-CM | POA: Diagnosis not present

## 2016-03-23 DIAGNOSIS — D225 Melanocytic nevi of trunk: Secondary | ICD-10-CM | POA: Diagnosis not present

## 2016-03-23 DIAGNOSIS — L570 Actinic keratosis: Secondary | ICD-10-CM | POA: Diagnosis not present

## 2016-03-23 DIAGNOSIS — D1800 Hemangioma unspecified site: Secondary | ICD-10-CM | POA: Diagnosis not present

## 2016-03-23 DIAGNOSIS — L821 Other seborrheic keratosis: Secondary | ICD-10-CM | POA: Diagnosis not present

## 2016-04-19 ENCOUNTER — Other Ambulatory Visit: Payer: Self-pay | Admitting: Internal Medicine

## 2016-04-19 MED ORDER — CLONAZEPAM 1 MG PO TABS: ORAL_TABLET | ORAL | Status: DC

## 2016-04-19 NOTE — Progress Notes (Signed)
RX CALLED INTO [place].

## 2016-05-05 ENCOUNTER — Ambulatory Visit (INDEPENDENT_AMBULATORY_CARE_PROVIDER_SITE_OTHER): Payer: Medicare HMO | Admitting: Internal Medicine

## 2016-05-05 ENCOUNTER — Encounter: Payer: Self-pay | Admitting: Internal Medicine

## 2016-05-05 VITALS — BP 132/84 | HR 64 | Temp 97.5°F | Resp 16 | Ht 67.5 in | Wt 214.4 lb

## 2016-05-05 DIAGNOSIS — M1000 Idiopathic gout, unspecified site: Secondary | ICD-10-CM

## 2016-05-05 DIAGNOSIS — Z79899 Other long term (current) drug therapy: Secondary | ICD-10-CM | POA: Diagnosis not present

## 2016-05-05 DIAGNOSIS — M109 Gout, unspecified: Secondary | ICD-10-CM | POA: Diagnosis not present

## 2016-05-05 DIAGNOSIS — E785 Hyperlipidemia, unspecified: Secondary | ICD-10-CM | POA: Diagnosis not present

## 2016-05-05 DIAGNOSIS — R7303 Prediabetes: Secondary | ICD-10-CM | POA: Diagnosis not present

## 2016-05-05 DIAGNOSIS — E559 Vitamin D deficiency, unspecified: Secondary | ICD-10-CM | POA: Diagnosis not present

## 2016-05-05 DIAGNOSIS — I10 Essential (primary) hypertension: Secondary | ICD-10-CM

## 2016-05-05 DIAGNOSIS — G5601 Carpal tunnel syndrome, right upper limb: Secondary | ICD-10-CM

## 2016-05-05 DIAGNOSIS — R7309 Other abnormal glucose: Secondary | ICD-10-CM | POA: Diagnosis not present

## 2016-05-05 LAB — CBC WITH DIFFERENTIAL/PLATELET
Basophils Absolute: 80 cells/uL (ref 0–200)
Basophils Relative: 1 %
Eosinophils Absolute: 80 cells/uL (ref 15–500)
Eosinophils Relative: 1 %
HCT: 50 % (ref 38.5–50.0)
Hemoglobin: 17.1 g/dL (ref 13.2–17.1)
Lymphocytes Relative: 30 %
Lymphs Abs: 2400 cells/uL (ref 850–3900)
MCH: 32.9 pg (ref 27.0–33.0)
MCHC: 34.2 g/dL (ref 32.0–36.0)
MCV: 96.3 fL (ref 80.0–100.0)
MPV: 11.6 fL (ref 7.5–12.5)
Monocytes Absolute: 720 cells/uL (ref 200–950)
Monocytes Relative: 9 %
Neutro Abs: 4720 cells/uL (ref 1500–7800)
Neutrophils Relative %: 59 %
Platelets: 168 10*3/uL (ref 140–400)
RBC: 5.19 MIL/uL (ref 4.20–5.80)
RDW: 13.8 % (ref 11.0–15.0)
WBC: 8 10*3/uL (ref 3.8–10.8)

## 2016-05-05 LAB — TSH: TSH: 1.21 mIU/L (ref 0.40–4.50)

## 2016-05-05 NOTE — Progress Notes (Signed)
Patient ID: Stephen Holland, male   DOB: 17-Nov-1951, 65 y.o.   MRN: [id number]  [facility]                       [name], M.D.        [name], P.A.-C       [name], P.A.-C   [facility]                [address] [place] [id number] Telephone [phone] Telefax [phone] _________________________________________________________________________     This very nice 65 y.o. MWM presents for  follow up with Hypertension, Hyperlipidemia, Pre-Diabetes, Gout  and Vitamin D Deficiency. Today, he's also c/o painful paresthesias of the R thumb, index & long fingers. Also is having intermittent pains of the R foot & 1st toe.     Patient is treated for HTN & BP has been controlled at home. Today's BP: 132/84 mmHg. Patient has had no complaints of any cardiac type chest pain, palpitations, dyspnea/orthopnea/PND, dizziness, claudication, or dependent edema.     Hyperlipidemia is controlled with diet & meds. Patient denies myalgias or other med SE's. Last Lipids were  Cholesterol 139; HDL 36*; LDL 39; elevated Triglycerides 318 on 10/27/2015.      Also, the patient has history of Morbid Obesity (BMI 33+) and consequent PreDiabetes and has had no symptoms of reactive hypoglycemia, diabetic polys, paresthesias or visual blurring.  Last A1c was 5.6% on 10/27/2015.      Further, the patient also has history of Vitamin D Deficiency and supplements vitamin D without any suspected side-effects. Last vitamin D was  65 on 07/23/2015.     Medication Sig  . allopurinol (ZYLOPRIM) 300 MG tablet Take 1 tablet (300 mg total) by mouth daily.  . bisoprolol-hydrochlorothiazide (ZIAC) 10-6.25 MG tablet TAKE ONE TABLET BY MOUTH ONCE DAILY FOR BLOOD PRESSURE  . Cholecalciferol (VITAMIN D PO) Take 5,000 Units by mouth 2 (two) times daily.  . clonazePAM (KLONOPIN) 1 MG tablet Take 1/2 to 1 tablet at bedtime  if needed for sleep  . KRILL OIL PO Take 350 mg by mouth 3 (three) times daily.  Marland Kitchen losartan-hydrochlorothiazide (HYZAAR) 100-25 MG per tablet TAKE 1 TABLET BY MOUTH DAILY.  . Magnesium 400 MG CAPS Take by mouth. Takes 800 mg daily  . meloxicam (MOBIC) 15 MG tablet TAKE ONE TABLET BY MOUTH ONCE DAILY WITH FOOD FOR ARTHIRITIS PAIN AND INFLAMMATION.  . rosuvastatin (CRESTOR) 40 MG tablet TAKE 1 TABLET BY MOUTH EVERY DAY   Allergies  Allergen Reactions  . Lidocaine     blisters   PMHx:   Past Medical History  Diagnosis Date  . Hypertension   . Hyperlipemia   . Prediabetes   . Obstructive sleep apnea     08/2012  . Vitamin D deficiency   . DJD (degenerative joint disease)   . Gout   . Prostate cancer [facility])     s/p prostatectomy  . ED (erectile dysfunction)   . Colon polyps    Immunization History  Administered Date(s) Administered  . Influenza Split 08/26/2014  . Influenza, Seasonal, Injecte, Preservative Fre 10/27/2015  . PPD Test 05/23/2014, 07/23/2015  . Pneumococcal Conjugate-13 08/26/2014  . Pneumococcal Polysaccharide-23 05/05/2010, 01/30/2016  . Td 11/30/2003  . Tdap 08/12/2010  . Zoster 04/29/2013   Past Surgical History  Procedure Laterality Date  . Prostatectomy  2003   FHx:    Reviewed / unchanged  SHx:    Reviewed / unchanged  Systems Review:  Constitutional: Denies fever, chills, wt changes, headaches, insomnia, fatigue, night sweats, change in appetite. Eyes: Denies redness, blurred vision, diplopia, discharge, itchy, watery eyes.  ENT: Denies discharge, congestion, post nasal drip, epistaxis, sore throat, earache, hearing loss, dental pain, tinnitus, vertigo, sinus pain, snoring.  CV: Denies chest pain, palpitations, irregular heartbeat, syncope, dyspnea, diaphoresis, orthopnea, PND, claudication or edema. Respiratory: denies cough, dyspnea, DOE, pleurisy, hoarseness, laryngitis, wheezing.  Gastrointestinal: Denies dysphagia, odynophagia, heartburn,  reflux, water brash, abdominal pain or cramps, nausea, vomiting, bloating, diarrhea, constipation, hematemesis, melena, hematochezia  or hemorrhoids. Genitourinary: Denies dysuria, frequency, urgency, nocturia, hesitancy, discharge, hematuria or flank pain. Musculoskeletal: Denies arthralgias, myalgias, stiffness, jt. swelling, pain, limping or strain/sprain.  Skin: Denies pruritus, rash, hives, warts, acne, eczema or change in skin lesion(s). Neuro: No weakness, tremor, incoordination, spasms, paresthesia or pain. Psychiatric: Denies confusion, memory loss or sensory loss. Endo: Denies change in weight, skin or hair change.  Heme/Lymph: No excessive bleeding, bruising or enlarged lymph nodes.  Physical Exam  BP 132/84 mmHg  Pulse 64  Temp(Src) 97.5 F (36.4 C)  Resp 16  Ht 5' 7.5" (1.715 m)  Wt 214 lb 6.4 oz (97.251 kg)  BMI 33.06 kg/m2  Appears well nourished and in no distress. Eyes: PERRLA, EOMs, conjunctiva no swelling or erythema. Sinuses: No frontal/maxillary tenderness ENT/Mouth: EAC's clear, TM's nl w/o erythema, bulging. Nares clear w/o erythema, swelling, exudates. Oropharynx clear without erythema or exudates. Oral hygiene is good. Tongue normal, non obstructing. Hearing intact.  Neck: Supple. Thyroid nl. Car 2+/2+ without bruits, nodes or JVD. Chest: Respirations nl with BS clear & equal w/o rales, rhonchi, wheezing or stridor.  Cor: Heart sounds normal w/ regular rate and rhythm without sig. murmurs, gallops, clicks, or rubs. Peripheral pulses normal and equal  without edema.  Abdomen: Rotund, Soft & bowel sounds normal. Non-tender w/o guarding, rebound, hernias, masses, or organomegaly.  Lymphatics: Unremarkable.  Musculoskeletal: Full ROM all peripheral extremities, joint stability, 5/5 strength, and normal gait.  Skin: Warm, dry without exposed rashes, lesions or ecchymosis apparent.  Neuro: Cranial nerves intact, reflexes equal bilaterally. Sensory-motor testing  grossly intact. Tendon reflexes grossly intact. (+) Rt. Phalen's  Pysch: Alert & oriented x 3.  Insight and judgement nl & appropriate. No ideations.  Assessment and Plan:  1. Essential hypertension  - TSH  2. Hyperlipemia  - Lipid panel - TSH  3. Prediabetes  - Hemoglobin A1c - Insulin, random  4. Vitamin D deficiency  - VITAMIN D 25 Hydroxy   5. Idiopathic gout  - Uric acid  6. Carpal tunnel syndrome of right wrist  - Ambulatory referral to Orthopedic Surgery  7. Medication management  - CBC with Differential/Platelet - BASIC METABOLIC PANEL WITH GFR - Hepatic function panel - Magnesium   Recommended regular exercise, BP monitoring, weight control, and discussed med and SE's. Recommended labs to assess and monitor clinical status. Further disposition pending results of labs. Over 30 minutes of exam, counseling, chart review was performed

## 2016-05-05 NOTE — Patient Instructions (Signed)
Gout Gout is an inflammatory arthritis caused by a buildup of uric acid crystals in the joints. Uric acid is a chemical that is normally present in the blood. When the level of uric acid in the blood is too high it can form crystals that deposit in your joints and tissues. This causes joint redness, soreness, and swelling (inflammation). Repeat attacks are common. Over time, uric acid crystals can form into masses (tophi) near a joint, destroying bone and causing disfigurement. Gout is treatable and often preventable. CAUSES  The disease begins with elevated levels of uric acid in the blood. Uric acid is produced by your body when it breaks down a naturally found substance called purines. Certain foods you eat, such as meats and fish, contain high amounts of purines. Causes of an elevated uric acid level include:  Being passed down from parent to child (heredity).  Diseases that cause increased uric acid production (such as obesity, psoriasis, and certain cancers).  Excessive alcohol use.  Diet, especially diets rich in meat and seafood.  Medicines, including certain cancer-fighting medicines (chemotherapy), water pills (diuretics), and aspirin.  Chronic kidney disease. The kidneys are no longer able to remove uric acid well.  Problems with metabolism. Conditions strongly associated with gout include:  Obesity.  High blood pressure.  High cholesterol.  Diabetes. Not everyone with elevated uric acid levels gets gout. It is not understood why some people get gout and others do not. Surgery, joint injury, and eating too much of certain foods are some of the factors that can lead to gout attacks. SYMPTOMS   An attack of gout comes on quickly. It causes intense pain with redness, swelling, and warmth in a joint.  Fever can occur.  Often, only one joint is involved. Certain joints are more commonly involved:  Base of the big toe.  Knee.  Ankle.  Wrist.  Finger. Without  treatment, an attack usually goes away in a few days to weeks. Between attacks, you usually will not have symptoms, which is different from many other forms of arthritis. DIAGNOSIS  Your caregiver will suspect gout based on your symptoms and exam. In some cases, tests may be recommended. The tests may include:  Blood tests.  Urine tests.  X-rays.  Joint fluid exam. This exam requires a needle to remove fluid from the joint (arthrocentesis). Using a microscope, gout is confirmed when uric acid crystals are seen in the joint fluid. TREATMENT  There are two phases to gout treatment: treating the sudden onset (acute) attack and preventing attacks (prophylaxis).  Treatment of an Acute Attack.  Medicines are used. These include anti-inflammatory medicines or steroid medicines.  An injection of steroid medicine into the affected joint is sometimes necessary.  The painful joint is rested. Movement can worsen the arthritis.  You may use warm or cold treatments on painful joints, depending which works best for you.  Treatment to Prevent Attacks.  If you suffer from frequent gout attacks, your caregiver may advise preventive medicine. These medicines are started after the acute attack subsides. These medicines either help your kidneys eliminate uric acid from your body or decrease your uric acid production. You may need to stay on these medicines for a very long time.  The early phase of treatment with preventive medicine can be associated with an increase in acute gout attacks. For this reason, during the first few months of treatment, your caregiver may also advise you to take medicines usually used for acute gout treatment. Be sure you   understand your caregiver's directions. Your caregiver may make several adjustments to your medicine dose before these medicines are effective.  Discuss dietary treatment with your caregiver or dietitian. Alcohol and drinks high in sugar and fructose and foods  such as meat, poultry, and seafood can increase uric acid levels. Your caregiver or dietitian can advise you on drinks and foods that should be limited. HOME CARE INSTRUCTIONS   Do not take aspirin to relieve pain. This raises uric acid levels.  Only take over-the-counter or prescription medicines for pain, discomfort, or fever as directed by your caregiver.  Rest the joint as much as possible. When in bed, keep sheets and blankets off painful areas.  Keep the affected joint raised (elevated).  Apply warm or cold treatments to painful joints. Use of warm or cold treatments depends on which works best for you.  Use crutches if the painful joint is in your leg.  Drink enough fluids to keep your urine clear or pale yellow. This helps your body get rid of uric acid. Limit alcohol, sugary drinks, and fructose drinks.  Follow your dietary instructions. Pay careful attention to the amount of protein you eat. Your daily diet should emphasize fruits, vegetables, whole grains, and fat-free or low-fat milk products. Discuss the use of coffee, vitamin C, and cherries with your caregiver or dietitian. These may be helpful in lowering uric acid levels.  Maintain a healthy body weight. SEEK MEDICAL CARE IF:   You develop diarrhea, vomiting, or any side effects from medicines.  You do not feel better in 24 hours, or you are getting worse. SEEK IMMEDIATE MEDICAL CARE IF:   Your joint becomes suddenly more tender, and you have chills or a fever. MAKE SURE YOU:   Understand these instructions.  Will watch your condition.  Will get help right away if you are not doing well or get worse.   This information is not intended to replace advice given to you by your health care provider. Make sure you discuss any questions you have with your health care provider.   Document Released: 11/12/2000 Document Revised: 12/06/2014 Document Reviewed: 06/28/2012 Elsevier Interactive Patient Education 2016  [employer]  ++++++++++++++++++++++++++++++++++++++++++++++++++++++++++ Recommend Adult Low Dose Aspirin or   coated  Aspirin 81 mg daily   To reduce risk of Colon Cancer 20 %,   Skin Cancer 26 % ,   Melanoma 46%   and   Pancreatic cancer 60%   ++++++++++++++++++++++++++++++++++++++++++++++++++++++ Vitamin D goal   is between 70-100.   Please make sure that you are taking your Vitamin D as directed.   It is very important as a natural anti-inflammatory   helping hair, skin, and nails, as well as reducing stroke and heart attack risk.   It helps your bones and helps with mood.  It also decreases numerous cancer risks so please take it as directed.   Low Vit D is associated with a 200-300% higher risk for CANCER   and 200-300% higher risk for HEART   ATTACK  &  STROKE.   ......................................  It is also associated with higher death rate at younger ages,   autoimmune diseases like Rheumatoid arthritis, Lupus, Multiple Sclerosis.     Also many other serious conditions, like depression, Alzheimer's  Dementia, infertility, muscle aches, fatigue, fibromyalgia - just to name a few.  ++++++++++++++++++++++++++++++++++++++++++++++++  Recommend the book "The END of DIETING" by Dr Joel Fuhrman   & the book "The END of DIABETES " by Dr Joel Fuhrman  At   [employer].com - get book & Audio CD's     Being diabetic has a  300% increased risk for heart attack, stroke, cancer, and alzheimer- type vascular dementia. It is very important that you work harder with diet by avoiding all foods that are white. Avoid white rice (brown & wild rice is OK), white potatoes (sweetpotatoes in moderation is OK), White bread or wheat bread or anything made out of white flour like bagels, donuts, rolls, buns, biscuits, cakes, pastries, cookies, pizza crust, and pasta (made from white flour & egg whites) - vegetarian pasta or spinach or wheat pasta is OK. Multigrain breads like  Arnold[employer] or [employer], or multigrain sandwich thins or flatbreads.  Diet, exercise and weight loss can reverse and cure diabetes in the early stages.  Diet, exercise and weight loss is very important in the control and prevention of complications of diabetes which affects every system in your body, ie. Brain - dementia/stroke, eyes - glaucoma/blindness, heart - heart attack/heart failure, kidneys - dialysis, stomach - gastric paralysis, intestines - malabsorption, nerves - severe painful neuritis, circulation - gangrene & loss of a leg(s), and finally cancer and Alzheimers.    I recommend avoid fried & greasy foods,  sweets/candy, white rice (brown or wild rice or Quinoa is OK), white potatoes (sweet potatoes are OK) - anything made from white flour - bagels, doughnuts, rolls, buns, biscuits,white and wheat breads, pizza crust and traditional pasta made of white flour & egg white(vegetarian pasta or spinach or wheat pasta is OK).  Multi-grain bread is OK - like multi-grain flat bread or sandwich thins. Avoid alcohol in excess. Exercise is also important.    Eat all the vegetables you want - avoid meat, especially red meat and dairy - especially cheese.  Cheese is the most concentrated form of trans-fats which is the worst thing to clog up our arteries. Veggie cheese is OK which can be found in the fresh produce section at [employer] or [employer] or [employer]  ++++++++++++++++++++++++++++++++++++++++++++++++++ DASH Eating Plan  DASH stands for "Dietary Approaches to Stop Hypertension."   The DASH eating plan is a healthy eating plan that has been shown to reduce high blood pressure (hypertension). Additional health benefits may include reducing the risk of type 2 diabetes mellitus, heart disease, and stroke. The DASH eating plan may also help with weight loss.  WHAT DO I NEED TO KNOW ABOUT THE DASH EATING PLAN?  For the DASH eating plan, you will follow these general guidelines:  Choose  foods with a percent daily value for sodium of less than 5% (as listed on the food label).  Use salt-free seasonings or herbs instead of table salt or sea salt.  Check with your health care provider or pharmacist before using salt substitutes.  Eat lower-sodium products, often labeled as "lower sodium" or "no salt added."  Eat fresh foods.  Eat more vegetables, fruits, and low-fat dairy products.    Choose whole grains. Look for the word "whole" as the first word in the ingredient list.  Choose fish   Limit sweets, desserts, sugars, and sugary drinks.  Choose heart-healthy fats.  Eat veggie cheese   Eat more home-cooked food and less restaurant, buffet, and fast food.  Limit fried foods.  Cook foods using methods other than frying.  Limit canned vegetables. If you do use them, rinse them well to decrease the sodium.  When eating at a restaurant, ask that your food be prepared with less salt, or no salt if possible.                        WHAT FOODS CAN I EAT?  Read Dr Joel Fuhrman's books on The End of Dieting & The End of Diabetes  Grains  Whole grain or whole wheat bread. Brown rice. Whole grain or whole wheat pasta. Quinoa, bulgur, and whole grain cereals. Low-sodium cereals. Corn or whole wheat flour tortillas. Whole grain cornbread. Whole grain crackers. Low-sodium crackers.  Vegetables  Fresh or frozen vegetables (raw, steamed, roasted, or grilled). Low-sodium or reduced-sodium tomato and vegetable juices. Low-sodium or reduced-sodium tomato sauce and paste. Low-sodium or reduced-sodium canned vegetables.   Fruits  All fresh, canned (in natural juice), or frozen fruits.  Protein Products   All fish and seafood.  Dried beans, peas, or lentils. Unsalted nuts and seeds. Unsalted canned beans.  Dairy  Low-fat dairy products, such as skim or 1% milk, 2% or reduced-fat cheeses, low-fat ricotta or cottage cheese, or plain low-fat yogurt. Low-sodium or  reduced-sodium cheeses.  Fats and Oils  Tub margarines without trans fats. Light or reduced-fat mayonnaise and salad dressings (reduced sodium). Avocado. Safflower, olive, or canola oils. Natural peanut or almond butter.  Other  Unsalted popcorn and pretzels. The items listed above may not be a complete list of recommended foods or beverages. Contact your dietitian for more options.  +++++++++++++++++++++++++++++++++++++++++++  WHAT FOODS ARE NOT RECOMMENDED?  Grains/ White flour or wheat flour  White bread. White pasta. White rice. Refined cornbread. Bagels and croissants. Crackers that contain trans fat.  Vegetables  Creamed or fried vegetables. Vegetables in a . Regular canned vegetables. Regular canned tomato sauce and paste. Regular tomato and vegetable juices.  Fruits  Dried fruits. Canned fruit in light or heavy syrup. Fruit juice.  Meat and Other Protein Products  Meat in general - RED mwaet & White meat.  Fatty cuts of meat. Ribs, chicken wings, bacon, sausage, bologna, salami, chitterlings, fatback, hot dogs, bratwurst, and packaged luncheon meats.  Dairy  Whole or 2% milk, cream, half-and-half, and cream cheese. Whole-fat or sweetened yogurt. Full-fat cheeses or blue cheese. Nondairy creamers and whipped toppings. Processed cheese, cheese spreads, or cheese curds.  Condiments  Onion and garlic salt, seasoned salt, table salt, and sea salt. Canned and packaged gravies. Worcestershire sauce. Tartar sauce. Barbecue sauce. Teriyaki sauce. Soy sauce, including reduced sodium. Steak sauce. Fish sauce. Oyster sauce. Cocktail sauce. Horseradish. Ketchup and mustard. Meat flavorings and tenderizers. Bouillon cubes. Hot sauce. Tabasco sauce. Marinades. Taco seasonings. Relishes.  Fats and Oils Butter, stick margarine, lard, shortening and bacon fat. Coconut, palm kernel, or palm oils. Regular salad dressings.  Pickles and olives. Salted popcorn and pretzels.  The items  listed above may not be a complete list of foods and beverages to avoid.   

## 2016-05-06 LAB — HEPATIC FUNCTION PANEL
ALT: 28 U/L (ref 9–46)
AST: 24 U/L (ref 10–35)
Albumin: 4.1 g/dL (ref 3.6–5.1)
Alkaline Phosphatase: 69 U/L (ref 40–115)
Bilirubin, Direct: 0.1 mg/dL (ref <=0.2)
Indirect Bilirubin: 0.4 mg/dL (ref 0.2–1.2)
Total Bilirubin: 0.5 mg/dL (ref 0.2–1.2)
Total Protein: 6.7 g/dL (ref 6.1–8.1)

## 2016-05-06 LAB — VITAMIN D 25 HYDROXY (VIT D DEFICIENCY, FRACTURES): Vit D, 25-Hydroxy: 68 ng/mL (ref 30–100)

## 2016-05-06 LAB — LIPID PANEL
Cholesterol: 147 mg/dL (ref 125–200)
HDL: 44 mg/dL (ref >=40)
LDL Cholesterol: 69 mg/dL (ref <130)
Total CHOL/HDL Ratio: 3.3 Ratio (ref <=5.0)
Triglycerides: 172 mg/dL — ABNORMAL HIGH (ref <150)
VLDL: 34 mg/dL — ABNORMAL HIGH (ref <30)

## 2016-05-06 LAB — HEMOGLOBIN A1C
Hgb A1c MFr Bld: 5.7 % — ABNORMAL HIGH (ref <5.7)
Mean Plasma Glucose: 117 mg/dL

## 2016-05-06 LAB — BASIC METABOLIC PANEL WITH GFR
BUN: 28 mg/dL — ABNORMAL HIGH (ref 7–25)
CO2: 23 mmol/L (ref 20–31)
Calcium: 9.2 mg/dL (ref 8.6–10.3)
Chloride: 103 mmol/L (ref 98–110)
Creat: 1.2 mg/dL (ref 0.70–1.25)
GFR, Est African American: 73 mL/min (ref >=60)
GFR, Est Non African American: 63 mL/min (ref >=60)
Glucose, Bld: 93 mg/dL (ref 65–99)
Potassium: 4.3 mmol/L (ref 3.5–5.3)
Sodium: 141 mmol/L (ref 135–146)

## 2016-05-06 LAB — INSULIN, RANDOM: Insulin: 48 u[IU]/mL — ABNORMAL HIGH (ref 2.0–19.6)

## 2016-05-06 LAB — MAGNESIUM: Magnesium: 1.8 mg/dL (ref 1.5–2.5)

## 2016-05-06 LAB — URIC ACID: Uric Acid, Serum: 6.7 mg/dL (ref 4.0–8.0)

## 2016-05-12 DIAGNOSIS — M25531 Pain in right wrist: Secondary | ICD-10-CM | POA: Diagnosis not present

## 2016-05-12 DIAGNOSIS — M25511 Pain in right shoulder: Secondary | ICD-10-CM | POA: Diagnosis not present

## 2016-05-22 ENCOUNTER — Encounter: Payer: Self-pay | Admitting: Internal Medicine

## 2016-05-31 DIAGNOSIS — G4733 Obstructive sleep apnea (adult) (pediatric): Secondary | ICD-10-CM | POA: Diagnosis not present

## 2016-06-07 ENCOUNTER — Other Ambulatory Visit: Payer: Self-pay | Admitting: Internal Medicine

## 2016-07-25 ENCOUNTER — Other Ambulatory Visit: Payer: Self-pay | Admitting: Internal Medicine

## 2016-07-28 ENCOUNTER — Telehealth: Payer: Self-pay | Admitting: Gastroenterology

## 2016-07-28 NOTE — Telephone Encounter (Signed)
Received colon and path report. Dr. Ardis Hughs is Doc of the Day. Records placed on Dr. Ardis Hughs desk for review.

## 2016-08-03 NOTE — Telephone Encounter (Signed)
Dr.Jacobs has accepted patient to have a follow up colonoscopy. Left phone number with patient's wife to have colonoscopy scheduled. Records will be in record file folder.

## 2016-08-04 ENCOUNTER — Encounter: Payer: Self-pay | Admitting: Gastroenterology

## 2016-08-12 ENCOUNTER — Ambulatory Visit (INDEPENDENT_AMBULATORY_CARE_PROVIDER_SITE_OTHER): Payer: Medicare HMO | Admitting: Internal Medicine

## 2016-08-12 ENCOUNTER — Encounter: Payer: Self-pay | Admitting: Internal Medicine

## 2016-08-12 VITALS — BP 134/76 | HR 60 | Temp 97.1°F | Resp 16 | Ht 67.0 in | Wt 214.4 lb

## 2016-08-12 DIAGNOSIS — Z0001 Encounter for general adult medical examination with abnormal findings: Secondary | ICD-10-CM

## 2016-08-12 DIAGNOSIS — E559 Vitamin D deficiency, unspecified: Secondary | ICD-10-CM | POA: Diagnosis not present

## 2016-08-12 DIAGNOSIS — I10 Essential (primary) hypertension: Secondary | ICD-10-CM

## 2016-08-12 DIAGNOSIS — Z0000 Encounter for general adult medical examination without abnormal findings: Secondary | ICD-10-CM

## 2016-08-12 DIAGNOSIS — Z125 Encounter for screening for malignant neoplasm of prostate: Secondary | ICD-10-CM

## 2016-08-12 DIAGNOSIS — Z23 Encounter for immunization: Secondary | ICD-10-CM | POA: Diagnosis not present

## 2016-08-12 DIAGNOSIS — Z136 Encounter for screening for cardiovascular disorders: Secondary | ICD-10-CM

## 2016-08-12 DIAGNOSIS — Z1212 Encounter for screening for malignant neoplasm of rectum: Secondary | ICD-10-CM

## 2016-08-12 DIAGNOSIS — M1000 Idiopathic gout, unspecified site: Secondary | ICD-10-CM

## 2016-08-12 DIAGNOSIS — E785 Hyperlipidemia, unspecified: Secondary | ICD-10-CM | POA: Diagnosis not present

## 2016-08-12 DIAGNOSIS — Z79899 Other long term (current) drug therapy: Secondary | ICD-10-CM | POA: Diagnosis not present

## 2016-08-12 DIAGNOSIS — R7303 Prediabetes: Secondary | ICD-10-CM

## 2016-08-12 DIAGNOSIS — R6889 Other general symptoms and signs: Secondary | ICD-10-CM | POA: Diagnosis not present

## 2016-08-12 LAB — BASIC METABOLIC PANEL WITH GFR
BUN: 27 mg/dL — ABNORMAL HIGH (ref 7–25)
CO2: 27 mmol/L (ref 20–31)
Calcium: 9.7 mg/dL (ref 8.6–10.3)
Chloride: 107 mmol/L (ref 98–110)
Creat: 1.22 mg/dL (ref 0.70–1.25)
GFR, Est African American: 71 mL/min (ref >=60)
GFR, Est Non African American: 62 mL/min (ref >=60)
Glucose, Bld: 70 mg/dL (ref 65–99)
Potassium: 4.8 mmol/L (ref 3.5–5.3)
Sodium: 144 mmol/L (ref 135–146)

## 2016-08-12 LAB — CBC WITH DIFFERENTIAL/PLATELET
Basophils Absolute: 88 cells/uL (ref 0–200)
Basophils Relative: 1 %
Eosinophils Absolute: 88 cells/uL (ref 15–500)
Eosinophils Relative: 1 %
HCT: 50.2 % — ABNORMAL HIGH (ref 38.5–50.0)
Hemoglobin: 17.3 g/dL — ABNORMAL HIGH (ref 13.2–17.1)
Lymphocytes Relative: 37 %
Lymphs Abs: 3256 cells/uL (ref 850–3900)
MCH: 33.2 pg — ABNORMAL HIGH (ref 27.0–33.0)
MCHC: 34.5 g/dL (ref 32.0–36.0)
MCV: 96.4 fL (ref 80.0–100.0)
MPV: 11.3 fL (ref 7.5–12.5)
Monocytes Absolute: 880 cells/uL (ref 200–950)
Monocytes Relative: 10 %
Neutro Abs: 4488 cells/uL (ref 1500–7800)
Neutrophils Relative %: 51 %
Platelets: 148 10*3/uL (ref 140–400)
RBC: 5.21 MIL/uL (ref 4.20–5.80)
RDW: 13.5 % (ref 11.0–15.0)
WBC: 8.8 10*3/uL (ref 3.8–10.8)

## 2016-08-12 LAB — LIPID PANEL
Cholesterol: 146 mg/dL (ref 125–200)
HDL: 48 mg/dL (ref >=40)
LDL Cholesterol: 47 mg/dL (ref <130)
Total CHOL/HDL Ratio: 3 Ratio (ref <=5.0)
Triglycerides: 256 mg/dL — ABNORMAL HIGH (ref <150)
VLDL: 51 mg/dL — ABNORMAL HIGH (ref <30)

## 2016-08-12 LAB — HEPATIC FUNCTION PANEL
ALT: 34 U/L (ref 9–46)
AST: 29 U/L (ref 10–35)
Albumin: 4.2 g/dL (ref 3.6–5.1)
Alkaline Phosphatase: 63 U/L (ref 40–115)
Bilirubin, Direct: 0.1 mg/dL (ref <=0.2)
Indirect Bilirubin: 0.4 mg/dL (ref 0.2–1.2)
Total Bilirubin: 0.5 mg/dL (ref 0.2–1.2)
Total Protein: 7 g/dL (ref 6.1–8.1)

## 2016-08-12 LAB — HEMOGLOBIN A1C
Hgb A1c MFr Bld: 5.1 % (ref <5.7)
Mean Plasma Glucose: 100 mg/dL

## 2016-08-12 LAB — MAGNESIUM: Magnesium: 2.1 mg/dL (ref 1.5–2.5)

## 2016-08-12 LAB — PSA: PSA: 0.2 ng/mL (ref <=4.0)

## 2016-08-12 LAB — TSH: TSH: 0.84 mIU/L (ref 0.40–4.50)

## 2016-08-12 LAB — URIC ACID: Uric Acid, Serum: 5.6 mg/dL (ref 4.0–8.0)

## 2016-08-12 NOTE — Patient Instructions (Signed)

## 2016-08-12 NOTE — Progress Notes (Signed)
[facility]   Stephen Holland, M.D.    Stephen Holland. Stephen Holland, P.A.-C      Stephen Holland, P.A.-C  [facility]                [address] [place], [place] [id number] Telephone [phone] Telefax [phone] Annual  Screening/Preventative Visit  & Comprehensive Evaluation & Examination     This very nice 65 y.o.  MWM presents for a Screening/Preventative Visit & comprehensive evaluation and management of multiple medical co-morbidities.  Patient has been followed for HTN, Prediabetes, Hyperlipidemia and Vitamin D Deficiency. Patient also has hx/o Gout apparently controlled w/o recent flare-up of sx's. Patient deferred to have Cologard last year and it returned positive and he was informed, but apparently recommended colonoscopy was never scheduled as patient never returned phone call to GI office.      HTN predates since the 1980's. Patient's BP has been controlled at home. Today's BP is  134/76. Patient denies any cardiac symptoms as chest pain, palpitations, shortness of breath, dizziness or ankle swelling.     Patient's hyperlipidemia is controlled with diet and medications. Patient denies myalgias or other medication SE's. Last lipids were at goal: Lab Results  Component Value Date   CHOL 147 05/05/2016   HDL 44 05/05/2016   LDLCALC 69 05/05/2016   TRIG 172 (H) 05/05/2016   CHOLHDL 3.3 05/05/2016      Patient has Morbid Obesity (BMI 33+) and consequent prediabetes with A1c 5.7%  since 2011 and patient denies reactive hypoglycemic symptoms, visual blurring, diabetic polys or paresthesias. Last A1c was still borderline elevated:  Lab Results  Component Value Date   HGBA1C 5.7 (H) 05/05/2016       Finally, patient has history of Vitamin D Deficiency in 2009 of "12" and last vitamin D was at goal: Lab Results  Component Value Date   VD25OH 68 05/05/2016   Current Outpatient Prescriptions on File  Prior to Visit  Medication Sig  . allopurinol (ZYLOPRIM) 300 MG tablet Take 1 tablet (300 mg total) by mouth daily.  . bisoprolol-hydrochlorothiazide (ZIAC) 10-6.25 MG tablet TAKE 1 TABLET BY MOUTH EVERY DAY FOR BLOOD PRESSURE  . Cholecalciferol (VITAMIN D PO) Take 5,000 Units by mouth 2 (two) times daily.  . clonazePAM (KLONOPIN) 1 MG tablet Take 1/2 to 1 tablet at bedtime if needed for sleep  . KRILL OIL PO Take 350 mg by mouth 2 times daily at 12 noon and 4 pm.   . losartan-hydrochlorothiazide (HYZAAR) 100-25 MG per tablet TAKE 1 TABLET BY MOUTH DAILY.  . Magnesium 400 MG CAPS Take by mouth. Takes 800 mg daily  . meloxicam (MOBIC) 15 MG tablet TAKE 1 TABLET BY MOUTH ONCE DAILY WITH FOOD FOR ARTHRITIS PAIN AND INFLAMMATION  . rosuvastatin (CRESTOR) 40 MG tablet TAKE 1 TABLET BY MOUTH EVERY DAY   No current facility-administered medications on file prior to visit.    Allergies  Allergen Reactions  . Lidocaine     blisters   Past Medical History:  Diagnosis Date  . Colon polyps   . DJD (degenerative joint disease)   . ED (erectile dysfunction)   . Gout   . Hyperlipemia   . Hypertension   . Obstructive sleep apnea    08/2012  . Prediabetes   . Prostate cancer [facility])    s/p prostatectomy  . Vitamin D deficiency  Health Maintenance  Topic Date Due  . INFLUENZA VACCINE  06/29/2016  . Fecal DNA (Cologuard)  08/10/2018  . TETANUS/TDAP  08/12/2020  . ZOSTAVAX  Completed  . Hepatitis C Screening  Completed  . HIV Screening  Completed  . PNA vac Low Risk Adult  Completed   Immunization History  Administered Date(s) Administered  . Influenza Split 08/26/2014  . Influenza, Seasonal, Injecte, Preservative Fre 10/27/2015  . PPD Test 05/23/2014, 07/23/2015  . Pneumococcal Conjugate-13 08/26/2014  . Pneumococcal Polysaccharide-23 05/05/2010, 01/30/2016  . Td 11/30/2003  . Tdap 08/12/2010  . Zoster 04/29/2013   Past Surgical History:  Procedure Laterality Date  .  PROSTATECTOMY  2003   Family History  Problem Relation Age of Onset  . Hypertension Mother     Social History   Social History  . Marital status: Married    Spouse name: N/A  . Number of children: N/A  . Years of education: N/A   Occupational History  . Retired Librarian, academic Lexicographer for [employer] History Main Topics  . Smoking status: Current Every Day Smoker    Packs/day: 1.00    Years: 45.00    Types: Cigarettes  . Smokeless tobacco: Never Used  . Alcohol use 9.0 oz/week    15 Standard drinks or equivalent per week  . Drug use: No    ROS Constitutional: Denies fever, chills, weight loss/gain, headaches, insomnia,  night sweats or change in appetite. Does c/o fatigue. Eyes: Denies redness, blurred vision, diplopia, discharge, itchy or watery eyes.  ENT: Denies discharge, congestion, post nasal drip, epistaxis, sore throat, earache, hearing loss, dental pain, Tinnitus, Vertigo, Sinus pain or snoring.  Cardio: Denies chest pain, palpitations, irregular heartbeat, syncope, dyspnea, diaphoresis, orthopnea, PND, claudication or edema Respiratory: denies cough, dyspnea, DOE, pleurisy, hoarseness, laryngitis or wheezing.  Gastrointestinal: Denies dysphagia, heartburn, reflux, water brash, pain, cramps, nausea, vomiting, bloating, diarrhea, constipation, hematemesis, melena, hematochezia, jaundice or hemorrhoids Genitourinary: Denies dysuria, frequency, urgency, nocturia, hesitancy, discharge, hematuria or flank pain Musculoskeletal: Denies arthralgia, myalgia, stiffness, Jt. Swelling, pain, limp or strain/sprain. Denies Falls. Skin: Denies puritis, rash, hives, warts, acne, eczema or change in skin lesion Neuro: No weakness, tremor, incoordination, spasms, paresthesia or pain Psychiatric: Denies confusion, memory loss or sensory loss. Denies Depression. Endocrine: Denies change in weight, skin, hair change, nocturia, and paresthesia, diabetic polys, visual blurring or  hyper / hypo glycemic episodes.  Heme/Lymph: No excessive bleeding, bruising or enlarged lymph nodes.  Physical Exam  BP 134/76   Pulse 60   Temp 97.1 F (36.2 C)   Resp 16   Ht 5\' 7"  (1.702 m)   Wt 214 lb 6.4 oz (97.3 kg)   BMI 33.58 kg/m   General Appearance: Over nourished, in no apparent distress.  Eyes: PERRLA, EOMs, conjunctiva no swelling or erythema, normal fundi and vessels. Sinuses: No frontal/maxillary tenderness ENT/Mouth: EACs patent / TMs  nl. Nares clear without erythema, swelling, mucoid exudates. Oral hygiene is good. No erythema, swelling, or exudate. Tongue normal, non-obstructing. Tonsils not swollen or erythematous. Hearing normal.  Neck: Supple, thyroid normal. No bruits, nodes or JVD. Respiratory: Respiratory effort normal.  BS equal and clear bilateral without rales, rhonci, wheezing or stridor. Cardio: Heart sounds are normal with regular rate and rhythm and no murmurs, rubs or gallops. Peripheral pulses are normal and equal bilaterally without edema. No aortic or femoral bruits. Chest: symmetric with normal excursions and percussion.  Abdomen: Soft, obese with Nl bowel sounds. Nontender, no guarding, rebound, hernias,  masses, or organomegaly.  Lymphatics: Non tender without lymphadenopathy.  Genitourinary: No hernias.Testes nl. DRE - prostate nl for age - smooth & firm w/o nodules. Musculoskeletal: Full ROM all peripheral extremities, joint stability, 5/5 strength, and normal gait. Skin: Warm and dry without rashes, lesions, cyanosis, clubbing or  ecchymosis.  Neuro: Cranial nerves intact, reflexes equal bilaterally. Normal muscle tone, no cerebellar symptoms. Sensation intact.  Pysch: Alert and oriented X 3 with normal affect, insight and judgment appropriate.   Assessment and Plan  1. Annual Preventative/Screening Exam   - Microalbumin / creatinine urine ratio - EKG 12-Lead - [place], RETROPERITNL ABD,  LTD - POC Hemoccult Bld/Stl  - Urinalysis, Routine  w reflex microscopic  - PSA - Uric acid - CBC with Differential/Platelet - BASIC METABOLIC PANEL WITH GFR - Hepatic function panel - Magnesium - Lipid panel - TSH - Hemoglobin A1c - Insulin, random - VITAMIN D 25 Hydroxy   2. Essential hypertension  - Microalbumin / creatinine urine ratio - EKG 12-Lead - [place], RETROPERITNL ABD,  LTD - TSH  3. Hyperlipemia  - EKG 12-Lead - [place], RETROPERITNL ABD,  LTD - Lipid panel - TSH  4. Prediabetes  - EKG 12-Lead - [place], RETROPERITNL ABD,  LTD - Hemoglobin A1c - Insulin, random  5. Vitamin D deficiency  - VITAMIN D 25 Hydroxy   6. Idiopathic gout  - Uric acid  7. Morbid obesity due to excess calories ([place])   8. Screening for rectal cancer  - POC Hemoccult Bld/Stl   9. Prostate cancer screening  - PSA  10. Medication management  - Urinalysis, Routine w reflex microscopic  - CBC with Differential/Platelet - BASIC METABOLIC PANEL WITH GFR - Hepatic function panel - Magnesium  11. Screening for ischemic heart disease  - EKG 12-Lead  12. Screening for AAA (aortic abdominal aneurysm)  - [place], RETROPERITNL ABD,  LTD  13. Need for prophylactic vaccination and inoculation against influenza  - Flu vaccine HIGH DOSE PF (Fluzone High dose)     Continue prudent diet as discussed, weight control, BP monitoring, regular exercise, and medications as discussed.  Discussed med effects and SE's. Routine screening labs and tests as requested with regular follow-up as recommended. Over 40 minutes of exam, counseling, chart review and high complex critical decision making was performed

## 2016-08-13 LAB — URINALYSIS, ROUTINE W REFLEX MICROSCOPIC
Bilirubin Urine: NEGATIVE
Glucose, UA: NEGATIVE
Hgb urine dipstick: NEGATIVE
Ketones, ur: NEGATIVE
Leukocytes, UA: NEGATIVE
Nitrite: NEGATIVE
Specific Gravity, Urine: 1.022 (ref 1.001–1.035)
pH: 6 (ref 5.0–8.0)

## 2016-08-13 LAB — URINALYSIS, MICROSCOPIC ONLY
Bacteria, UA: NONE SEEN [HPF]
Casts: NONE SEEN [LPF]
Crystals: NONE SEEN [HPF]
RBC / HPF: NONE SEEN RBC/HPF (ref <=2)
Squamous Epithelial / LPF: NONE SEEN [HPF] (ref <=5)
WBC, UA: NONE SEEN WBC/HPF (ref <=5)
Yeast: NONE SEEN [HPF]

## 2016-08-13 LAB — INSULIN, RANDOM: Insulin: 16.2 u[IU]/mL (ref 2.0–19.6)

## 2016-08-13 LAB — MICROALBUMIN / CREATININE URINE RATIO
Creatinine, Urine: 164 mg/dL (ref 20–370)
Microalb Creat Ratio: 83 mcg/mg creat — ABNORMAL HIGH (ref <30)
Microalb, Ur: 13.6 mg/dL

## 2016-08-13 LAB — VITAMIN D 25 HYDROXY (VIT D DEFICIENCY, FRACTURES): Vit D, 25-Hydroxy: 70 ng/mL (ref 30–100)

## 2016-08-20 ENCOUNTER — Other Ambulatory Visit: Payer: Self-pay | Admitting: Internal Medicine

## 2016-08-26 ENCOUNTER — Other Ambulatory Visit: Payer: Self-pay | Admitting: Gastroenterology

## 2016-08-31 ENCOUNTER — Encounter (HOSPITAL_COMMUNITY): Payer: Self-pay | Admitting: *Deleted

## 2016-09-03 DIAGNOSIS — G4733 Obstructive sleep apnea (adult) (pediatric): Secondary | ICD-10-CM | POA: Diagnosis not present

## 2016-09-06 ENCOUNTER — Encounter (HOSPITAL_COMMUNITY): Payer: Self-pay

## 2016-09-06 ENCOUNTER — Ambulatory Visit (HOSPITAL_COMMUNITY)
Admission: RE | Admit: 2016-09-06 | Discharge: 2016-09-06 | Disposition: A | Discharge status: Home / Self Care | Payer: Medicare HMO | Source: Ambulatory Visit | Attending: Gastroenterology | Admitting: Gastroenterology

## 2016-09-06 ENCOUNTER — Ambulatory Visit (HOSPITAL_COMMUNITY): Payer: Medicare HMO | Admitting: Anesthesiology

## 2016-09-06 ENCOUNTER — Encounter (HOSPITAL_COMMUNITY)
Admission: RE | Disposition: A | Discharge status: Home / Self Care | Payer: Self-pay | Source: Ambulatory Visit | Attending: Gastroenterology

## 2016-09-06 DIAGNOSIS — G4730 Sleep apnea, unspecified: Secondary | ICD-10-CM | POA: Diagnosis not present

## 2016-09-06 DIAGNOSIS — Z8546 Personal history of malignant neoplasm of prostate: Secondary | ICD-10-CM | POA: Insufficient documentation

## 2016-09-06 DIAGNOSIS — F17200 Nicotine dependence, unspecified, uncomplicated: Secondary | ICD-10-CM | POA: Insufficient documentation

## 2016-09-06 DIAGNOSIS — Z86010 Personal history of colonic polyps: Secondary | ICD-10-CM | POA: Insufficient documentation

## 2016-09-06 DIAGNOSIS — Z791 Long term (current) use of non-steroidal anti-inflammatories (NSAID): Secondary | ICD-10-CM | POA: Diagnosis not present

## 2016-09-06 DIAGNOSIS — R69 Illness, unspecified: Secondary | ICD-10-CM | POA: Diagnosis not present

## 2016-09-06 DIAGNOSIS — K5730 Diverticulosis of large intestine without perforation or abscess without bleeding: Secondary | ICD-10-CM | POA: Insufficient documentation

## 2016-09-06 DIAGNOSIS — Z1211 Encounter for screening for malignant neoplasm of colon: Secondary | ICD-10-CM | POA: Diagnosis not present

## 2016-09-06 DIAGNOSIS — M109 Gout, unspecified: Secondary | ICD-10-CM | POA: Diagnosis not present

## 2016-09-06 DIAGNOSIS — E7800 Pure hypercholesterolemia, unspecified: Secondary | ICD-10-CM | POA: Insufficient documentation

## 2016-09-06 DIAGNOSIS — Z79899 Other long term (current) drug therapy: Secondary | ICD-10-CM | POA: Diagnosis not present

## 2016-09-06 DIAGNOSIS — I10 Essential (primary) hypertension: Secondary | ICD-10-CM | POA: Insufficient documentation

## 2016-09-06 DIAGNOSIS — K621 Rectal polyp: Secondary | ICD-10-CM | POA: Insufficient documentation

## 2016-09-06 HISTORY — PX: COLONOSCOPY WITH PROPOFOL: SHX5780

## 2016-09-06 LAB — HM COLONOSCOPY

## 2016-09-06 SURGERY — COLONOSCOPY WITH PROPOFOL
Anesthesia: Monitor Anesthesia Care

## 2016-09-06 MED ORDER — PROPOFOL 500 MG/50ML IV EMUL
INTRAVENOUS | Status: DC | PRN
  Administered 2016-09-06: 100 ug/kg/min via INTRAVENOUS

## 2016-09-06 MED ORDER — PROPOFOL 10 MG/ML IV BOLUS
INTRAVENOUS | Status: DC | PRN
Start: 2016-09-06 — End: 2016-09-06
  Administered 2016-09-06 (×2): 30 mg via INTRAVENOUS
  Administered 2016-09-06: 10 mg via INTRAVENOUS

## 2016-09-06 MED ORDER — LIDOCAINE 2% (20 MG/ML) 5 ML SYRINGE
INTRAMUSCULAR | Status: AC
  Filled 2016-09-06: qty 5

## 2016-09-06 MED ORDER — PROPOFOL 10 MG/ML IV BOLUS
INTRAVENOUS | Status: AC
  Filled 2016-09-06: qty 20

## 2016-09-06 MED ORDER — LACTATED RINGERS IV SOLN
INTRAVENOUS | Status: DC
  Administered 2016-09-06: 1000 mL via INTRAVENOUS

## 2016-09-06 MED ORDER — SODIUM CHLORIDE 0.9 % IV SOLN: INTRAVENOUS | Status: DC

## 2016-09-06 SURGICAL SUPPLY — 22 items

## 2016-09-06 NOTE — Transfer of Care (Signed)
Immediate Anesthesia Transfer of Care Note  Patient: Stephen Holland  Procedure(s) Performed: Procedure(s): COLONOSCOPY WITH PROPOFOL (N/A)  Patient Location: PACU  Anesthesia Type:MAC  Level of Consciousness: Patient easily awoken, sedated, comfortable, cooperative, following commands, responds to stimulation.   Airway & Oxygen Therapy: Patient spontaneously breathing, ventilating well, oxygen via simple oxygen mask.  Post-op Assessment: Report given to PACU RN, vital signs reviewed and stable, moving all extremities.   Post vital signs: Reviewed and stable.  Complications: No apparent anesthesia complications  Last Vitals:  Vitals:   09/06/16 0929  BP: (!) 149/82  Pulse: 61  Resp: 20  Temp: 36.6 C    Last Pain:  Vitals:   09/06/16 0929  TempSrc: Oral         Complications: No apparent anesthesia complications

## 2016-09-06 NOTE — Discharge Instructions (Signed)

## 2016-09-06 NOTE — Op Note (Signed)
[facility] Patient Name: Stephen Holland Procedure Date: 09/06/2016 MRN: [id number] Attending MD: Garlan Fair , MD Date of Birth: 1951-11-18 CSN: [id number] Age: 65 Admit Type: Outpatient Procedure:                Colonoscopy Indications:              Screening for colorectal malignant neoplasm Providers:                Garlan Fair, MD, Laverta Baltimore RN, RN,                            Despina Pole Tech, Technician, Glenis Smoker, CRNA Referring MD:              Medicines:                Propofol per Anesthesia Complications:            No immediate complications. Estimated Blood Loss:     Estimated blood loss: none. Procedure:                Pre-Anesthesia Assessment:                           - Prior to the procedure, a History and Physical                            was performed, and patient medications and                            allergies were reviewed. The patient's tolerance of                            previous anesthesia was also reviewed. The risks                            and benefits of the procedure and the sedation                            options and risks were discussed with the patient.                            All questions were answered, and informed consent                            was obtained. Prior Anticoagulants: The patient has                            taken no previous anticoagulant or antiplatelet                            agents. ASA Grade Assessment: III - A patient with                            severe systemic disease. After reviewing the risks  and benefits, the patient was deemed in                            satisfactory condition to undergo the procedure.                           After obtaining informed consent, the colonoscope                            was passed under direct vision. Throughout the                            procedure, the patient's blood pressure, pulse, and                             oxygen saturations were monitored continuously. The                            [id number] scope was introduced through                            the anus and advanced to the the cecum, identified                            by appendiceal orifice and ileocecal valve. The                            colonoscopy was performed without difficulty. The                            patient tolerated the procedure well. The quality                            of the bowel preparation was good. The ileocecal                            valve, the appendiceal orifice and the rectum were                            photographed. Findings:      The perianal and digital rectal examinations were normal.      A 3 mm polyp was found in the rectum. The polyp was sessile. The polyp       was removed with a cold biopsy forceps. Resection and retrieval were       complete.      Multiple small and large-mouthed diverticula were found in the sigmoid       colon.      The exam was otherwise without abnormality. Impression:               - One 3 mm polyp in the rectum, removed with a cold                            biopsy forceps. Resected and retrieved.                           -  Diverticulosis in the sigmoid colon.                           - The examination was otherwise normal. Moderate Sedation:      N/A- Per Anesthesia Care Recommendation:           - Patient has a contact number available for                            emergencies. The signs and symptoms of potential                            delayed complications were discussed with the                            patient. Return to normal activities tomorrow.                            Written discharge instructions were provided to the                            patient.                           - Repeat colonoscopy date to be determined after                            pending pathology results are reviewed for                             surveillance.                           - Resume previous diet.                           - Continue present medications. Procedure Code(s):        --- Professional ---                           [phone], Colonoscopy, flexible; with biopsy, single                            or multiple Diagnosis Code(s):        --- Professional ---                           Z12.11, Encounter for screening for malignant                            neoplasm of colon                           K62.1, Rectal polyp                           K57.30, Diverticulosis of large intestine without  perforation or abscess without bleeding CPT copyright 2016 American Medical Association. All rights reserved. The codes documented in this report are preliminary and upon coder review may  be revised to meet current compliance requirements. Earle Gell, MD Garlan Fair, MD 09/06/2016 12:01:03 PM This report has been signed electronically. Number of Addenda: 0

## 2016-09-06 NOTE — Anesthesia Postprocedure Evaluation (Signed)
Anesthesia Post Note  Patient: Stephen Holland  Procedure(s) Performed: Procedure(s) (LRB): COLONOSCOPY WITH PROPOFOL (N/A)  Patient location during evaluation: PACU Anesthesia Type: MAC Level of consciousness: awake and alert Pain management: pain level controlled Vital Signs Assessment: post-procedure vital signs reviewed and stable Respiratory status: spontaneous breathing, nonlabored ventilation, respiratory function stable and patient connected to nasal cannula oxygen Cardiovascular status: stable and blood pressure returned to baseline Anesthetic complications: no    Last Vitals:  Vitals:   09/06/16 1215 09/06/16 1220  BP:  (!) 144/92  Pulse: (!) 58 (!) 56  Resp: 14 19  Temp: 36.5 C     Last Pain:  Vitals:   09/06/16 1215  TempSrc: Oral                 [name]

## 2016-09-06 NOTE — H&P (Signed)
Procedure: Screening colonoscopy to evaluate positive colguard stool colon cancer screening test. Normal surveillance colonoscopy was performed on 06/30/2009  History: The patient is a 65 year old male born 04-05-1951. He is scheduled to undergo a screening colonoscopy today.  Past medical history: Prostate cancer surgery. History of colon polyps. Gout. Hypercholesterolemia. Hypertension.  Medication allergies: None  Exam: The patient is alert and lying comfortably on the endoscopy stretcher. Abdomen is soft and nontender to palpation. Lungs are clear to auscultation. Cardiac exam reveals a regular rhythm.  Plan: Proceed with screening colonoscopy

## 2016-09-06 NOTE — Anesthesia Preprocedure Evaluation (Addendum)
Anesthesia Evaluation  Patient identified by MRN, date of birth, ID band Patient awake    Reviewed: Allergy & Precautions, NPO status , Patient's Chart, lab work & pertinent test results  Airway Mallampati: II  TM Distance: >3 FB Neck ROM: Full    Dental  (+) Dental Advisory Given   Pulmonary sleep apnea and Continuous Positive Airway Pressure Ventilation , Current Smoker,    breath sounds clear to auscultation       Cardiovascular hypertension, Pt. on medications  Rhythm:Regular Rate:Normal     Neuro/Psych PSYCHIATRIC DISORDERS negative neurological ROS     GI/Hepatic negative GI ROS, Neg liver ROS,   Endo/Other  negative endocrine ROS  Renal/GU negative Renal ROS  negative genitourinary   Musculoskeletal  (+) Arthritis , Osteoarthritis,    Abdominal   Peds negative pediatric ROS (+)  Hematology negative hematology ROS (+)   Anesthesia Other Findings   Reproductive/Obstetrics negative OB ROS                            Anesthesia Physical Anesthesia Plan  ASA: II  Anesthesia Plan: MAC   Post-op Pain Management:    Induction: Intravenous  Airway Management Planned: Natural Airway  Additional Equipment:   Intra-op Plan:   Post-operative Plan:   Informed Consent: I have reviewed the patients History and Physical, chart, labs and discussed the procedure including the risks, benefits and alternatives for the proposed anesthesia with the patient or authorized representative who has indicated his/her understanding and acceptance.   Dental advisory given  Plan Discussed with: CRNA  Anesthesia Plan Comments: (Did not take home beta blocker per patient. HR currently 50-60's will hold for now. Pt should take later. )       Anesthesia Quick Evaluation

## 2016-09-07 ENCOUNTER — Encounter (HOSPITAL_COMMUNITY): Payer: Self-pay | Admitting: Gastroenterology

## 2016-09-10 ENCOUNTER — Encounter: Payer: Managed Care, Other (non HMO) | Admitting: Gastroenterology

## 2016-09-25 ENCOUNTER — Encounter: Payer: Self-pay | Admitting: *Deleted

## 2016-10-29 ENCOUNTER — Encounter: Payer: Managed Care, Other (non HMO) | Admitting: Gastroenterology

## 2016-11-04 ENCOUNTER — Other Ambulatory Visit: Payer: Self-pay | Admitting: Internal Medicine

## 2016-11-05 ENCOUNTER — Encounter: Payer: Managed Care, Other (non HMO) | Admitting: Gastroenterology

## 2016-11-17 ENCOUNTER — Encounter: Payer: Self-pay | Admitting: Internal Medicine

## 2016-11-17 ENCOUNTER — Other Ambulatory Visit: Payer: Self-pay

## 2016-11-17 ENCOUNTER — Ambulatory Visit (INDEPENDENT_AMBULATORY_CARE_PROVIDER_SITE_OTHER): Payer: Medicare HMO | Admitting: Internal Medicine

## 2016-11-17 VITALS — BP 138/82 | HR 70 | Temp 98.2°F | Resp 16 | Ht 67.0 in | Wt 215.0 lb

## 2016-11-17 DIAGNOSIS — Z79899 Other long term (current) drug therapy: Secondary | ICD-10-CM

## 2016-11-17 DIAGNOSIS — F17200 Nicotine dependence, unspecified, uncomplicated: Secondary | ICD-10-CM | POA: Diagnosis not present

## 2016-11-17 DIAGNOSIS — E782 Mixed hyperlipidemia: Secondary | ICD-10-CM | POA: Diagnosis not present

## 2016-11-17 DIAGNOSIS — G609 Hereditary and idiopathic neuropathy, unspecified: Secondary | ICD-10-CM

## 2016-11-17 DIAGNOSIS — R69 Illness, unspecified: Secondary | ICD-10-CM | POA: Diagnosis not present

## 2016-11-17 DIAGNOSIS — I10 Essential (primary) hypertension: Secondary | ICD-10-CM

## 2016-11-17 DIAGNOSIS — R7303 Prediabetes: Secondary | ICD-10-CM

## 2016-11-17 DIAGNOSIS — E559 Vitamin D deficiency, unspecified: Secondary | ICD-10-CM

## 2016-11-17 MED ORDER — VARENICLINE TARTRATE 1 MG PO TABS: 1.0000 mg | ORAL_TABLET | Freq: Two times a day (BID) | ORAL | 2 refills | Status: DC

## 2016-11-17 MED ORDER — AMITRIPTYLINE HCL 10 MG PO TABS: 10.0000 mg | ORAL_TABLET | Freq: Two times a day (BID) | ORAL | 0 refills | Status: DC

## 2016-11-17 NOTE — Progress Notes (Signed)
Assessment and Plan:  Hypertension:  -Continue medication,  -monitor blood pressure at home.  -Continue DASH diet.   -Reminder to go to the ER if any CP, SOB, nausea, dizziness, severe HA, changes vision/speech, left arm numbness and tingling, and jaw pain.  Cholesterol: -Continue diet and exercise.   Pre-diabetes: -Continue diet and exercise.   Vitamin D Def: -continue medications.   Tobacco abuse -chantix -recheck at next visit -he is aware to call the office if issues  Neuropathy -elavil 10 mg BID  Continue diet and meds as discussed. Further disposition pending results of labs.  HPI 65 y.o. male  presents for 3 month follow up with hypertension, hyperlipidemia, prediabetes and vitamin D.   His blood pressure has been controlled at home, today their BP is BP: 138/82.   He does not workout. He denies chest pain, shortness of breath, dizziness.  He reports that his blood pressure has been doing great.  He is not exercising regularly.   He is on cholesterol medication and denies myalgias. His cholesterol is at goal. The cholesterol last visit was:   Lab Results  Component Value Date   CHOL 146 08/12/2016   HDL 48 08/12/2016   LDLCALC 47 08/12/2016   TRIG 256 (H) 08/12/2016   CHOLHDL 3.0 08/12/2016     He has been working on diet and exercise for prediabetes, and denies foot ulcerations, hyperglycemia, hypoglycemia , increased appetite, nausea, paresthesia of the feet, polydipsia, polyuria, visual disturbances, vomiting and weight loss. Last A1C in the office was:  Lab Results  Component Value Date   HGBA1C 5.1 08/12/2016    Patient is on Vitamin D supplement.  Lab Results  Component Value Date   VD25OH 20 08/12/2016      He reports that the neuropathy is bothering him lately.  He has consulted neurosurgery who noted that surgery will not fix the issue.  He would like to try something to see if this will help him.  He is interested in quitting smoking.  He  reports that he has tried quitting cold [place] in the past.   Current Medications:  Current Outpatient Prescriptions on File Prior to Visit  Medication Sig Dispense Refill  . allopurinol (ZYLOPRIM) 300 MG tablet TAKE 1 TABLET BY MOUTH EVERY DAY 90 tablet 1  . bisoprolol-hydrochlorothiazide (ZIAC) 10-6.25 MG tablet TAKE 1 TABLET BY MOUTH EVERY DAY FOR BLOOD PRESSURE 90 tablet 1  . Cholecalciferol (VITAMIN D PO) Take 10,000 Units by mouth daily.     . clonazePAM (KLONOPIN) 1 MG tablet Take 1/2 to 1 tablet at bedtime if needed for sleep 90 tablet 1  . KRILL OIL PO Take 350 mg by mouth 2 times daily at 12 noon and 4 pm.     . losartan-hydrochlorothiazide (HYZAAR) 100-25 MG tablet TAKE 1 TABLET BY MOUTH EVERY DAY 90 tablet 0  . Magnesium 400 MG CAPS Take by mouth.     . meloxicam (MOBIC) 15 MG tablet TAKE 1 TABLET BY MOUTH ONCE DAILY WITH FOOD FOR ARTHRITIS PAIN AND INFLAMMATION 90 tablet 1  . rosuvastatin (CRESTOR) 40 MG tablet TAKE 1 TABLET BY MOUTH EVERY DAY 90 tablet 0   No current facility-administered medications on file prior to visit.     Medical History:  Past Medical History:  Diagnosis Date  . Colon polyps   . DJD (degenerative joint disease)   . ED (erectile dysfunction)   . Gout   . Hyperlipemia   . Hypertension   . Obstructive sleep  apnea    08/2012, cpap setting of 14  . Prostate cancer ([place]) 01/2002   s/p prostatectomy  . Vitamin D deficiency     Allergies:  Allergies  Allergen Reactions  . Lidocaine     blisters     Review of Systems:  ROS  Family history- Review and unchanged  Social history- Review and unchanged  Physical Exam: BP 138/82   Pulse 70   Temp 98.2 F (36.8 C) (Temporal)   Resp 16   Ht 5\' 7"  (1.702 m)   Wt 215 lb (97.5 kg)   BMI 33.67 kg/m  Wt Readings from Last 3 Encounters:  11/17/16 215 lb (97.5 kg)  09/06/16 208 lb (94.3 kg)  08/12/16 214 lb 6.4 oz (97.3 kg)    General Appearance: Well nourished well developed, in no  apparent distress. Eyes: PERRLA, EOMs, conjunctiva no swelling or erythema ENT/Mouth: Ear canals normal without obstruction, swelling, erythma, discharge.  TMs normal bilaterally.  Oropharynx moist, clear, without exudate, or postoropharyngeal swelling. Neck: Supple, thyroid normal,no cervical adenopathy  Respiratory: Respiratory effort normal, Breath sounds clear A&P without rhonchi, wheeze, or rale.  No retractions, no accessory usage. Cardio: RRR with no MRGs. Brisk peripheral pulses without edema.  Abdomen: Soft, + BS,  Non tender, no guarding, rebound, hernias, masses. Musculoskeletal: Full ROM, 5/5 strength, Normal gait Skin: Warm, dry without rashes, lesions, ecchymosis.  Neuro: Awake and oriented X 3, Cranial nerves intact. Normal muscle tone, no cerebellar symptoms. Psych: Normal affect, Insight and Judgment appropriate.    [name], PA-C 10:53 AM [facility]

## 2016-11-17 NOTE — Patient Instructions (Signed)
Steps to Quit Smoking Smoking tobacco can be bad for your health. It can also affect almost every organ in your body. Smoking puts you and people around you at risk for many serious long-lasting (chronic) diseases. Quitting smoking is hard, but it is one of the best things that you can do for your health. It is never too late to quit. What are the benefits of quitting smoking? When you quit smoking, you lower your risk for getting serious diseases and conditions. They can include:  Lung cancer or lung disease.  Heart disease.  Stroke.  Heart attack.  Not being able to have children (infertility).  Weak bones (osteoporosis) and broken bones (fractures). If you have coughing, wheezing, and shortness of breath, those symptoms may get better when you quit. You may also get sick less often. If you are pregnant, quitting smoking can help to lower your chances of having a baby of low birth weight. What can I do to help me quit smoking? Talk with your doctor about what can help you quit smoking. Some things you can do (strategies) include:  Quitting smoking totally, instead of slowly cutting back how much you smoke over a period of time.  Going to in-person counseling. You are more likely to quit if you go to many counseling sessions.  Using resources and support systems, such as:  Online chats with a counselor.  Phone quitlines.  Printed self-help materials.  Support groups or group counseling.  Text messaging programs.  Mobile phone apps or applications.  Taking medicines. Some of these medicines may have nicotine in them. If you are pregnant or breastfeeding, do not take any medicines to quit smoking unless your doctor says it is okay. Talk with your doctor about counseling or other things that can help you. Talk with your doctor about using more than one strategy at the same time, such as taking medicines while you are also going to in-person counseling. This can help make quitting  easier. What things can I do to make it easier to quit? Quitting smoking might feel very hard at first, but there is a lot that you can do to make it easier. Take these steps:  Talk to your family and friends. Ask them to support and encourage you.  Call phone quitlines, reach out to support groups, or work with a counselor.  Ask people who smoke to not smoke around you.  Avoid places that make you want (trigger) to smoke, such as:  Bars.  Parties.  Smoke-break areas at work.  Spend time with people who do not smoke.  Lower the stress in your life. Stress can make you want to smoke. Try these things to help your stress:  Getting regular exercise.  Deep-breathing exercises.  Yoga.  Meditating.  Doing a body scan. To do this, close your eyes, focus on one area of your body at a time from head to toe, and notice which parts of your body are tense. Try to relax the muscles in those areas.  Download or buy apps on your mobile phone or tablet that can help you stick to your quit plan. There are many free apps, such as QuitGuide from the CDC (Centers for Disease Control and Prevention). You can find more support from smokefree.gov and other websites. This information is not intended to replace advice given to you by your health care provider. Make sure you discuss any questions you have with your health care provider. Document Released: 09/11/2009 Document Revised: 07/13/2016 Document   Reviewed: 04/01/2015 Elsevier Interactive Patient Education  2017 Elsevier Inc.  Amitriptyline tablets What is this medicine? AMITRIPTYLINE (a mee TRIP ti leen) is used to treat depression. This medicine may be used for other purposes; ask your health care provider or pharmacist if you have questions. COMMON BRAND NAME(S): Elavil, Vanatrip What should I tell my health care provider before I take this medicine? They need to know if you have any of these conditions: -an alcohol problem -asthma,  difficulty breathing -bipolar disorder or schizophrenia -difficulty passing urine, prostate trouble -glaucoma -heart disease or previous heart attack -liver disease -over active thyroid -seizures -thoughts or plans of suicide, a previous suicide attempt, or family history of suicide attempt -an unusual or allergic reaction to amitriptyline, other medicines, foods, dyes, or preservatives -pregnant or trying to get pregnant -breast-feeding How should I use this medicine? Take this medicine by mouth with a drink of water. Follow the directions on the prescription label. You can take the tablets with or without food. Take your medicine at regular intervals. Do not take it more often than directed. Do not stop taking this medicine suddenly except upon the advice of your doctor. Stopping this medicine too quickly may cause serious side effects or your condition may worsen. A special MedGuide will be given to you by the pharmacist with each prescription and refill. Be sure to read this information carefully each time. Talk to your pediatrician regarding the use of this medicine in children. Special care may be needed. Overdosage: If you think you have taken too much of this medicine contact a poison control center or emergency room at once. NOTE: This medicine is only for you. Do not share this medicine with others. What if I miss a dose? If you miss a dose, take it as soon as you can. If it is almost time for your next dose, take only that dose. Do not take double or extra doses. What may interact with this medicine? Do not take this medicine with any of the following medications: -arsenic trioxide -certain medicines used to regulate abnormal heartbeat or to treat other heart conditions -cisapride -droperidol -halofantrine -linezolid -MAOIs like Carbex, Eldepryl, Marplan, Nardil, and Parnate -methylene blue -other medicines for mental depression -phenothiazines like perphenazine,  thioridazine and chlorpromazine -pimozide -probucol -procarbazine -sparfloxacin -St. John's Wort -ziprasidone This medicine may also interact with the following medications: -atropine and related drugs like hyoscyamine, scopolamine, tolterodine and others -barbiturate medicines for inducing sleep or treating seizures, like phenobarbital -cimetidine -disulfiram -ethchlorvynol -thyroid hormones such as levothyroxine This list may not describe all possible interactions. Give your health care provider a list of all the medicines, herbs, non-prescription drugs, or dietary supplements you use. Also tell them if you smoke, drink alcohol, or use illegal drugs. Some items may interact with your medicine. What should I watch for while using this medicine? Tell your doctor if your symptoms do not get better or if they get worse. Visit your doctor or health care professional for regular checks on your progress. Because it may take several weeks to see the full effects of this medicine, it is important to continue your treatment as prescribed by your doctor. Patients and their families should watch out for new or worsening thoughts of suicide or depression. Also watch out for sudden changes in feelings such as feeling anxious, agitated, panicky, irritable, hostile, aggressive, impulsive, severely restless, overly excited and hyperactive, or not being able to sleep. If this happens, especially at the beginning of treatment or after  a change in dose, call your health care professional. Dennis Bast may get drowsy or dizzy. Do not drive, use machinery, or do anything that needs mental alertness until you know how this medicine affects you. Do not stand or sit up quickly, especially if you are an older patient. This reduces the risk of dizzy or fainting spells. Alcohol may interfere with the effect of this medicine. Avoid alcoholic drinks. Do not treat yourself for coughs, colds, or allergies without asking your doctor or  health care professional for advice. Some ingredients can increase possible side effects. Your mouth may get dry. Chewing sugarless gum or sucking hard candy, and drinking plenty of water will help. Contact your doctor if the problem does not go away or is severe. This medicine may cause dry eyes and blurred vision. If you wear contact lenses you may feel some discomfort. Lubricating drops may help. See your eye doctor if the problem does not go away or is severe. This medicine can cause constipation. Try to have a bowel movement at least every 2 to 3 days. If you do not have a bowel movement for 3 days, call your doctor or health care professional. This medicine can make you more sensitive to the sun. Keep out of the sun. If you cannot avoid being in the sun, wear protective clothing and use sunscreen. Do not use sun lamps or tanning beds/booths. What side effects may I notice from receiving this medicine? Side effects that you should report to your doctor or health care professional as soon as possible: -allergic reactions like skin rash, itching or hives, swelling of the face, lips, or tongue -anxious -breathing problems -changes in vision -confusion -elevated mood, decreased need for sleep, racing thoughts, impulsive behavior -eye pain -fast, irregular heartbeat -feeling faint or lightheaded, falls -feeling agitated, angry, or irritable -fever with increased sweating -hallucination, loss of contact with reality -seizures -stiff muscles -suicidal thoughts or other mood changes -tingling, pain, or numbness in the feet or hands -trouble passing urine or change in the amount of urine -trouble sleeping -unusually weak or tired -vomiting -yellowing of the eyes or skin Side effects that usually do not require medical attention (report to your doctor or health care professional if they continue or are bothersome): -change in sex drive or performance -change in appetite or  weight -constipation -dizziness -dry mouth -nausea -tired -tremors -upset stomach This list may not describe all possible side effects. Call your doctor for medical advice about side effects. You may report side effects to FDA at 1-800-FDA-1088. Where should I keep my medicine? Keep out of the reach of children. Store at room temperature between 20 and 25 degrees C (68 and 77 degrees F). Throw away any unused medicine after the expiration date. NOTE: This sheet is a summary. It may not cover all possible information. If you have questions about this medicine, talk to your doctor, pharmacist, or health care provider.  2017 Elsevier/Gold Standard (2016-04-16 12:14:15)

## 2016-11-26 ENCOUNTER — Other Ambulatory Visit: Payer: Self-pay | Admitting: Internal Medicine

## 2016-12-09 DIAGNOSIS — G4733 Obstructive sleep apnea (adult) (pediatric): Secondary | ICD-10-CM | POA: Diagnosis not present

## 2016-12-20 ENCOUNTER — Other Ambulatory Visit: Payer: Self-pay | Admitting: *Deleted

## 2016-12-20 MED ORDER — BISOPROLOL-HYDROCHLOROTHIAZIDE 10-6.25 MG PO TABS: ORAL_TABLET | ORAL | 1 refills | Status: DC

## 2016-12-20 MED ORDER — CLONAZEPAM 1 MG PO TABS: ORAL_TABLET | ORAL | 0 refills | Status: DC

## 2016-12-20 MED ORDER — ALLOPURINOL 300 MG PO TABS: 300.0000 mg | ORAL_TABLET | Freq: Every day | ORAL | 1 refills | Status: DC

## 2016-12-20 MED ORDER — ROSUVASTATIN CALCIUM 40 MG PO TABS: 40.0000 mg | ORAL_TABLET | Freq: Every day | ORAL | 0 refills | Status: DC

## 2016-12-20 MED ORDER — LOSARTAN POTASSIUM-HCTZ 100-25 MG PO TABS: 1.0000 | ORAL_TABLET | Freq: Every day | ORAL | 0 refills | Status: DC

## 2016-12-20 MED ORDER — MELOXICAM 15 MG PO TABS: ORAL_TABLET | ORAL | 1 refills | Status: DC

## 2017-01-05 DIAGNOSIS — R69 Illness, unspecified: Secondary | ICD-10-CM | POA: Diagnosis not present

## 2017-02-18 ENCOUNTER — Ambulatory Visit (INDEPENDENT_AMBULATORY_CARE_PROVIDER_SITE_OTHER): Payer: Medicare HMO | Admitting: Internal Medicine

## 2017-02-18 ENCOUNTER — Encounter: Payer: Self-pay | Admitting: Internal Medicine

## 2017-02-18 VITALS — BP 126/82 | HR 60 | Temp 97.7°F | Resp 16 | Ht 67.0 in | Wt 212.0 lb

## 2017-02-18 DIAGNOSIS — E559 Vitamin D deficiency, unspecified: Secondary | ICD-10-CM | POA: Diagnosis not present

## 2017-02-18 DIAGNOSIS — I10 Essential (primary) hypertension: Secondary | ICD-10-CM

## 2017-02-18 DIAGNOSIS — M1000 Idiopathic gout, unspecified site: Secondary | ICD-10-CM | POA: Diagnosis not present

## 2017-02-18 DIAGNOSIS — Z79899 Other long term (current) drug therapy: Secondary | ICD-10-CM

## 2017-02-18 DIAGNOSIS — J0140 Acute pansinusitis, unspecified: Secondary | ICD-10-CM

## 2017-02-18 DIAGNOSIS — R7303 Prediabetes: Secondary | ICD-10-CM | POA: Diagnosis not present

## 2017-02-18 DIAGNOSIS — E782 Mixed hyperlipidemia: Secondary | ICD-10-CM

## 2017-02-18 LAB — BASIC METABOLIC PANEL WITH GFR
BUN: 27 mg/dL — ABNORMAL HIGH (ref 7–25)
CO2: 27 mmol/L (ref 20–31)
Calcium: 9.6 mg/dL (ref 8.6–10.3)
Chloride: 106 mmol/L (ref 98–110)
Creat: 1.26 mg/dL — ABNORMAL HIGH (ref 0.70–1.25)
GFR, Est African American: 68 mL/min (ref >=60)
GFR, Est Non African American: 59 mL/min — ABNORMAL LOW (ref >=60)
Glucose, Bld: 103 mg/dL — ABNORMAL HIGH (ref 65–99)
Potassium: 4.4 mmol/L (ref 3.5–5.3)
Sodium: 143 mmol/L (ref 135–146)

## 2017-02-18 LAB — CBC WITH DIFFERENTIAL/PLATELET
Basophils Absolute: 0 cells/uL (ref 0–200)
Basophils Relative: 0 %
Eosinophils Absolute: 87 cells/uL (ref 15–500)
Eosinophils Relative: 1 %
HCT: 52.4 % — ABNORMAL HIGH (ref 38.5–50.0)
Hemoglobin: 18 g/dL — ABNORMAL HIGH (ref 13.2–17.1)
Lymphocytes Relative: 30 %
Lymphs Abs: 2610 cells/uL (ref 850–3900)
MCH: 32.9 pg (ref 27.0–33.0)
MCHC: 34.4 g/dL (ref 32.0–36.0)
MCV: 95.8 fL (ref 80.0–100.0)
MPV: 11.7 fL (ref 7.5–12.5)
Monocytes Absolute: 870 cells/uL (ref 200–950)
Monocytes Relative: 10 %
Neutro Abs: 5133 cells/uL (ref 1500–7800)
Neutrophils Relative %: 59 %
Platelets: 152 10*3/uL (ref 140–400)
RBC: 5.47 MIL/uL (ref 4.20–5.80)
RDW: 14.3 % (ref 11.0–15.0)
WBC: 8.7 10*3/uL (ref 3.8–10.8)

## 2017-02-18 LAB — HEPATIC FUNCTION PANEL
ALT: 30 U/L (ref 9–46)
AST: 25 U/L (ref 10–35)
Albumin: 4.1 g/dL (ref 3.6–5.1)
Alkaline Phosphatase: 67 U/L (ref 40–115)
Bilirubin, Direct: 0.1 mg/dL (ref <=0.2)
Indirect Bilirubin: 0.4 mg/dL (ref 0.2–1.2)
Total Bilirubin: 0.5 mg/dL (ref 0.2–1.2)
Total Protein: 7 g/dL (ref 6.1–8.1)

## 2017-02-18 LAB — LIPID PANEL
Cholesterol: 147 mg/dL (ref <200)
HDL: 39 mg/dL — ABNORMAL LOW (ref >40)
LDL Cholesterol: 59 mg/dL (ref <100)
Total CHOL/HDL Ratio: 3.8 Ratio (ref <5.0)
Triglycerides: 245 mg/dL — ABNORMAL HIGH (ref <150)
VLDL: 49 mg/dL — ABNORMAL HIGH (ref <30)

## 2017-02-18 LAB — TSH: TSH: 0.64 mIU/L (ref 0.40–4.50)

## 2017-02-18 MED ORDER — AZITHROMYCIN 250 MG PO TABS: ORAL_TABLET | ORAL | 1 refills | Status: DC

## 2017-02-18 MED ORDER — PREDNISONE 20 MG PO TABS: ORAL_TABLET | ORAL | 0 refills | Status: DC

## 2017-02-18 NOTE — Patient Instructions (Signed)

## 2017-02-18 NOTE — Progress Notes (Signed)
This very nice 66 y.o. MWM presents for 6 month follow up with Hypertension, Hyperlipidemia, Pre-Diabetes and Vitamin D Deficiency.  Patient did have a (+) Cologard in Sept 2016 and a negative Colonoscopy in 2017. Patient has hx/o Gout apparently controlled on his Allopurinol. He also reports recent head congestion and purulent nasal drainage. Patient also has CPAP reporting 90+ compliance and improved restorative sleep and less daytime fatigue.      Patient is treated for HTN (1980's) & BP has been controlled at home. Today's BP is at goal -  126/82. Patient has had no complaints of any cardiac type chest pain, palpitations, dyspnea/orthopnea/PND, dizziness, claudication, or dependent edema.     Hyperlipidemia is controlled with diet & meds. Patient denies myalgias or other med SE's. Last Lipids were at goal albeit elevated elevated Trig's: Lab Results  Component Value Date   CHOL 146 08/12/2016   HDL 48 08/12/2016   LDLCALC 47 08/12/2016   TRIG 256 (H) 08/12/2016   CHOLHDL 3.0 08/12/2016      Also, the patient has history of Morbid Obesity (BMI 33+) and  PreDiabetes (A1c 5.7% in 2011)  and has had no symptoms of reactive hypoglycemia, diabetic polys, paresthesias or visual blurring.  Last A1c was at goal: Lab Results  Component Value Date   HGBA1C 5.1 08/12/2016      Further, the patient also has history of Vitamin D Deficiency ("12" in 2009)  and supplements vitamin D without any suspected side-effects. Last vitamin D was at goal:  Lab Results  Component Value Date   VD25OH 24 08/12/2016   Current Outpatient Prescriptions on File Prior to Visit  Medication Sig  . allopurinol (ZYLOPRIM) 300 MG tablet Take 1 tablet (300 mg total) by mouth daily.  . bisoprolol-hydrochlorothiazide (ZIAC) 10-6.25 MG tablet TAKE 1 TABLET BY MOUTH EVERY DAY FOR BLOOD PRESSURE  . Cholecalciferol (VITAMIN D PO) Take 10,000 Units by mouth daily.   . clonazePAM (KLONOPIN) 1 MG tablet Take 1/2 to 1 tablet  at bedtime if needed for sleep  . KRILL OIL PO Take 350 mg by mouth 2 times daily at 12 noon and 4 pm.   . losartan-hydrochlorothiazide (HYZAAR) 100-25 MG tablet Take 1 tablet by mouth daily.  . Magnesium 400 MG CAPS Take by mouth.   . meloxicam (MOBIC) 15 MG tablet TAKE 1 TABLET BY MOUTH ONCE DAILY WITH FOOD FOR ARTHRITIS PAIN AND INFLAMMATION  . rosuvastatin (CRESTOR) 40 MG tablet Take 1 tablet (40 mg total) by mouth daily.   No current facility-administered medications on file prior to visit.    Allergies  Allergen Reactions  . Lidocaine     blisters   PMHx:   Past Medical History:  Diagnosis Date  . Colon polyps   . DJD (degenerative joint disease)   . ED (erectile dysfunction)   . Gout   . Hyperlipemia   . Hypertension   . Obstructive sleep apnea    08/2012, cpap setting of 14  . Prostate cancer ([place]) 01/2002   s/p prostatectomy  . Vitamin D deficiency    Immunization History  Administered Date(s) Administered  . Influenza Split 08/26/2014  . Influenza, High Dose Seasonal PF 08/12/2016  . Influenza, Seasonal, Injecte, Preservative Fre 10/27/2015  . PPD Test 05/23/2014, 07/23/2015  . Pneumococcal Conjugate-13 08/26/2014  . Pneumococcal Polysaccharide-23 05/05/2010, 01/30/2016  . Td 11/30/2003  . Tdap 08/12/2010  . Zoster 04/29/2013   Past Surgical History:  Procedure Laterality Date  . COLONOSCOPY  WITH PROPOFOL N/A 09/06/2016   Procedure: COLONOSCOPY WITH PROPOFOL;  Surgeon: Garlan Fair, MD;  Location: [facility];  Service: Endoscopy;  Laterality: N/A;  . colonscopy     x 3 with polyp one time  . PROSTATECTOMY  2003   FHx:    Reviewed / unchanged  SHx:    Reviewed / unchanged  Systems Review:  Constitutional: Denies fever, chills, wt changes, headaches, insomnia, fatigue, night sweats, change in appetite. Eyes: Denies redness, blurred vision, diplopia, discharge, itchy, watery eyes.  ENT: Denies epistaxis, sore throat, earache, hearing loss, dental  pain, tinnitus, vertigo, snoring. Has had sinus congestion, drainjage  & pain. CV: Denies chest pain, palpitations, irregular heartbeat, syncope, dyspnea, diaphoresis, orthopnea, PND, claudication or edema. Respiratory: denies cough, dyspnea, DOE, pleurisy, hoarseness, laryngitis, wheezing.  Gastrointestinal: Denies dysphagia, odynophagia, heartburn, reflux, water brash, abdominal pain or cramps, nausea, vomiting, bloating, diarrhea, constipation, hematemesis, melena, hematochezia  or hemorrhoids. Genitourinary: Denies dysuria, frequency, urgency, nocturia, hesitancy, discharge, hematuria or flank pain. Musculoskeletal: Denies arthralgias, myalgias, stiffness, jt. swelling, pain, limping or strain/sprain.  Skin: Denies pruritus, rash, hives, warts, acne, eczema or change in skin lesion(s). Neuro: No weakness, tremor, incoordination, spasms, paresthesia or pain. Psychiatric: Denies confusion, memory loss or sensory loss. Endo: Denies change in weight, skin or hair change.  Heme/Lymph: No excessive bleeding, bruising or enlarged lymph nodes.  Physical Exam  BP 126/82   Pulse 60   Temp 97.7 F (36.5 C)   Resp 16   Ht 5\' 7"  (1.702 m)   Wt 212 lb (96.2 kg)   BMI 33.20 kg/m   Appears well nourished and in no distress.  Eyes: PERRLA, EOMs, conjunctiva no swelling or erythema. Sinuses: (+) frontal/maxillary tenderness ENT/Mouth: EAC's clear, TM's nl w/o erythema, bulging. Nares clear w/o erythema, swelling, exudates. Oropharynx clear without erythema or exudates. Oral hygiene is good. Tongue normal, non obstructing. Hearing intact.  Neck: Supple. Thyroid nl. Car 2+/2+ without bruits, nodes or JVD. Chest: Respirations nl with BS clear & equal w/o rales, rhonchi, wheezing or stridor.  Cor: Heart sounds normal w/ regular rate and rhythm without sig. murmurs, gallops, clicks, or rubs. Peripheral pulses normal and equal  without edema.  Abdomen: Soft & bowel sounds normal. Non-tender w/o guarding,  rebound, hernias, masses, or organomegaly.  Lymphatics: Unremarkable.  Musculoskeletal: Full ROM all peripheral extremities, joint stability, 5/5 strength, and normal gait.  Skin: Warm, dry without exposed rashes, lesions or ecchymosis apparent.  Neuro: Cranial nerves intact, reflexes equal bilaterally. Sensory-motor testing grossly intact. Tendon reflexes grossly intact.  Pysch: Alert & oriented x 3.  Insight and judgement nl & appropriate. No ideations.  Assessment and Plan:  1. Essential hypertension  - Continue medication, monitor blood pressure at home.  - Continue DASH diet. Reminder to go to the ER if any CP,  SOB, nausea, dizziness, severe HA, changes vision/speech,  left arm numbness and tingling and jaw pain.  - CBC with Differential/Platelet - BASIC METABOLIC PANEL WITH GFR - Magnesium - TSH  2. Mixed hyperlipidemia  - Continue diet/meds, exercise,& lifestyle modifications.  - Continue monitor periodic cholesterol/liver & renal functions  - Hepatic function panel - Lipid panel - TSH  3. Prediabetes  - Continue diet, exercise, lifestyle modifications.  - Monitor appropriate labs.  - Hemoglobin A1c - Insulin, random  4. Vitamin D deficiency  - Continue supplementation.  - VITAMIN D 25 Hydroxy   5. Idiopathic gout  - Uric acid  6. Medication management  - CBC with Differential/Platelet -  BASIC METABOLIC PANEL WITH GFR - Hepatic function panel - Magnesium - Lipid panel - TSH - Hemoglobin A1c - Insulin, random - VITAMIN D 25 Hydroxy  - Uric acid  7. Acute pansinusitis  - predniSONE (DELTASONE) 20 MG tablet; 1 tab 3 x day for 3 days, then 1 tab 2 x day for 3 days, then 1 tab 1 x day for 5 days  Dispense: 20 tablet; Refill: 0  - azithromycin (ZITHROMAX) 250 MG tablet; Take 2 tablets (500 mg) on  Day 1,  followed by 1 tablet (250 mg) once daily on Days 2 through 5.  Dispense: 6 each; Refill: 1       Recommended regular exercise, BP monitoring,  weight control, and discussed med and SE's. Recommended labs to assess and monitor clinical status. Further disposition pending results of labs. Over 30 minutes of exam, counseling, chart review was performed

## 2017-02-19 LAB — MAGNESIUM: Magnesium: 1.9 mg/dL (ref 1.5–2.5)

## 2017-02-19 LAB — VITAMIN D 25 HYDROXY (VIT D DEFICIENCY, FRACTURES): Vit D, 25-Hydroxy: 70 ng/mL (ref 30–100)

## 2017-02-19 LAB — HEMOGLOBIN A1C
Hgb A1c MFr Bld: 5.1 % (ref <5.7)
Mean Plasma Glucose: 100 mg/dL

## 2017-02-19 LAB — URIC ACID: Uric Acid, Serum: 6.6 mg/dL (ref 4.0–8.0)

## 2017-02-21 LAB — INSULIN, RANDOM: Insulin: 91.8 u[IU]/mL — ABNORMAL HIGH (ref 2.0–19.6)

## 2017-03-17 ENCOUNTER — Other Ambulatory Visit: Payer: Self-pay | Admitting: Internal Medicine

## 2017-03-23 DIAGNOSIS — D1800 Hemangioma unspecified site: Secondary | ICD-10-CM | POA: Diagnosis not present

## 2017-03-23 DIAGNOSIS — D225 Melanocytic nevi of trunk: Secondary | ICD-10-CM | POA: Diagnosis not present

## 2017-03-23 DIAGNOSIS — L821 Other seborrheic keratosis: Secondary | ICD-10-CM | POA: Diagnosis not present

## 2017-03-23 DIAGNOSIS — L814 Other melanin hyperpigmentation: Secondary | ICD-10-CM | POA: Diagnosis not present

## 2017-03-23 DIAGNOSIS — L570 Actinic keratosis: Secondary | ICD-10-CM | POA: Diagnosis not present

## 2017-04-12 ENCOUNTER — Encounter: Payer: Self-pay | Admitting: *Deleted

## 2017-05-07 ENCOUNTER — Encounter: Payer: Self-pay | Admitting: Internal Medicine

## 2017-05-07 ENCOUNTER — Other Ambulatory Visit: Payer: Self-pay | Admitting: Internal Medicine

## 2017-05-07 DIAGNOSIS — J0140 Acute pansinusitis, unspecified: Secondary | ICD-10-CM

## 2017-05-07 MED ORDER — AZITHROMYCIN 250 MG PO TABS: ORAL_TABLET | ORAL | 0 refills | Status: DC

## 2017-05-22 ENCOUNTER — Other Ambulatory Visit: Payer: Self-pay | Admitting: Internal Medicine

## 2017-05-26 ENCOUNTER — Ambulatory Visit: Payer: Self-pay | Admitting: Internal Medicine

## 2017-06-05 ENCOUNTER — Other Ambulatory Visit: Payer: Self-pay | Admitting: Internal Medicine

## 2017-06-05 NOTE — Telephone Encounter (Signed)
Please call Clonazepam 

## 2017-06-12 NOTE — Progress Notes (Signed)
medicare wellness visit and follow up [id number] Assessment:   Essential hypertension - continue medications, DASH diet, exercise and monitor at home. Call if greater than 130/80.  -     CBC with Differential/Platelet -     BASIC METABOLIC PANEL WITH GFR -     Hepatic function panel -     TSH  OSA and COPD overlap syndrome Continue CPAP, stop smoking.  Morbid Obesity with co morbidities - long discussion about weight loss, diet, and exercise  Tobacco use disorder Discussed low dose Ct versus CXr, would like to proceed with Ct scan screen - over 50 pack year smoking history, current smoker, no symptoms.  -     CT CHEST LUNG CANCER SCREENING LOW DOSE WO CONTRAST; Future - going to try to quit on his own, will think about trying chantix again.   Mixed hyperlipidemia -continue medications, check lipids, decrease fatty foods, increase activity.  -     Lipid panel  Prediabetes Discussed general issues about diabetes pathophysiology and management., Educational material distributed., Suggested low cholesterol diet., Encouraged aerobic exercise., Discussed foot care., Reminded to get yearly retinal exam. -     Hemoglobin A1c  Vitamin D deficiency Continue supplement  Idiopathic gout, unspecified chronicity, unspecified site Gout- recheck Uric acid as needed, Diet discussed, continue medications.  Medication management -     Magnesium  Erectile dysfunction, unspecified erectile dysfunction type Weight loss advised  Osteoarthritis, unspecified osteoarthritis type, unspecified site Weight loss advised  Encounter for Medicare annual wellness exam Due next year  Chronic obstructive pulmonary disease, unspecified COPD type ([place]) No symptoms, no medications for it, advised to quit smoking ,will try to get low dose Ct scan   Over 30 minutes of exam, counseling, chart review, and critical decision making was performed  Plan:   During the course of the visit the patient was educated  and counseled about appropriate screening and preventive services including:    Pneumococcal vaccine   Influenza vaccine  Prevnar 13  Td vaccine  Screening electrocardiogram  Colorectal cancer screening  Diabetes screening  Glaucoma screening  Nutrition counseling    Subjective:  Stephen Holland is a 66 y.o. male who presents for Medicare Annual Wellness Visit and 3 month follow up for HTN, hyperlipidemia, prediabetes, and vitamin D Def.   His blood pressure has been controlled at home, today their BP is BP: 126/80  He does workout. He denies chest pain, shortness of breath, dizziness.  He does smoke, was on chantix x 1 month but states it did not help.  Will work 2 days a week part time [employer] for sleep but will take every other day.   He is on cholesterol medication and denies myalgias. His cholesterol is at goal. The cholesterol last visit was:   Lab Results  Component Value Date   CHOL 147 02/18/2017   HDL 39 (L) 02/18/2017   LDLCALC 59 02/18/2017   TRIG 245 (H) 02/18/2017   CHOLHDL 3.8 02/18/2017    He has been working on diet and exercise for prediabetes, and denies paresthesia of the feet, polydipsia, polyuria and visual disturbances. Last A1C in the office was:  Lab Results  Component Value Date   HGBA1C 5.1 02/18/2017   Lab Results  Component Value Date   GFRNONAA 67 (L) 02/18/2017   Patient is on Vitamin D supplement.   Lab Results  Component Value Date   VD25OH 69 02/18/2017     Patient is on allopurinol for  gout and does not report a recent flare.  Lab Results  Component Value Date   LABURIC 6.6 02/18/2017   BMI is Body mass index is 33.77 kg/m., he is working on diet and exercise. Wt Readings from Last 3 Encounters:  06/13/17 215 lb 9.6 oz (97.8 kg)  02/18/17 212 lb (96.2 kg)  11/17/16 215 lb (97.5 kg)    Medication Review: Current Outpatient Prescriptions on File Prior to Visit  Medication Sig Dispense Refill  .  allopurinol (ZYLOPRIM) 300 MG tablet Take 1 tablet (300 mg total) by mouth daily. 90 tablet 1  . bisoprolol-hydrochlorothiazide (ZIAC) 10-6.25 MG tablet TAKE 1 TABLET BY MOUTH EVERY DAY FOR BLOOD PRESSURE 90 tablet 1  . Cholecalciferol (VITAMIN D PO) Take 10,000 Units by mouth daily.     . clonazePAM (KLONOPIN) 1 MG tablet TAKE 1/2 TO 1 TABLET BY MOUTH AT BEDTIME 90 tablet 0  . KRILL OIL PO Take 350 mg by mouth 2 times daily at 12 noon and 4 pm.     . losartan-hydrochlorothiazide (HYZAAR) 100-25 MG tablet Take 1 tablet by mouth daily. 90 tablet 0  . Magnesium 400 MG CAPS Take by mouth.     . meloxicam (MOBIC) 15 MG tablet TAKE 1 TABLET BY MOUTH ONCE DAILY WITH FOOD FOR ARTHRITIS PAIN AND INFLAMMATION 90 tablet 1  . rosuvastatin (CRESTOR) 40 MG tablet TAKE 1 TABLET (40 MG TOTAL) BY MOUTH DAILY. 90 tablet 1   No current facility-administered medications on file prior to visit.     Current Problems (verified) Patient Active Problem List   Diagnosis Date Noted  . COPD (chronic obstructive pulmonary disease) ([place]) 06/13/2017  . Medication management 12/03/2014  . Morbid obesity (BMI 33)  08/26/2014  . Tobacco use disorder 08/26/2014  . Hypertension   . Hyperlipemia   . Prediabetes   . OSA and COPD overlap syndrome ([place])   . Vitamin D deficiency   . DJD (degenerative joint disease)   . Primary gout   . ED (erectile dysfunction)     Screening Tests Immunization History  Administered Date(s) Administered  . Influenza Split 08/26/2014  . Influenza, High Dose Seasonal PF 08/12/2016  . Influenza, Seasonal, Injecte, Preservative Fre 10/27/2015  . PPD Test 05/23/2014, 07/23/2015  . Pneumococcal Conjugate-13 08/26/2014  . Pneumococcal Polysaccharide-23 05/05/2010, 01/30/2016  . Td 11/30/2003  . Tdap 08/12/2010  . Zoster 04/29/2013   Preventative care: Last colonoscopy: 08/2016 CT chest 04/2009 CXR 2015 + COPD  Prior vaccinations: TD or Tdap: 2010   Influenza: 2017 Pneumococcal:  2011 Prevnar13: 2015 Shingles/Zostavax: 2014  Names of Other Physician/Practitioners you currently use: 1. [facility] here for primary care 2. [facility], Therapist, art, last visit 2016 3. Dr. Freda Munro and Kalman Shan, dentist, last visit 2018 q 6 months Patient Care Team: [name], MD as PCP - General (Internal Medicine)  Allergies Allergies  Allergen Reactions  . Lidocaine     blisters    SURGICAL HISTORY He  has a past surgical history that includes Prostatectomy (2003); colonscopy; and Colonoscopy with propofol (N/A, 09/06/2016). FAMILY HISTORY His family history includes Hypertension in his mother. SOCIAL HISTORY He  reports that he has been smoking Cigarettes.  He has a 45.00 pack-year smoking history. He has never used smokeless tobacco. He reports that he drinks about 9.0 oz of alcohol per week . He reports that he does not use drugs.  MEDICARE WELLNESS OBJECTIVES: Physical activity: Current Exercise Habits: The patient does not participate in  regular exercise at present Cardiac risk factors: Cardiac Risk Factors include: advanced age (>10men, >40 women);dyslipidemia;hypertension;male gender;obesity (BMI >30kg/m2);sedentary lifestyle;smoking/ tobacco exposure Depression/mood screen:   Depression screen [facility] 2/9 06/13/2017  Decreased Interest 0  Down, Depressed, Hopeless 0  PHQ - 2 Score 0    ADLs:  In your present state of health, do you have any difficulty performing the following activities: 06/13/2017 02/18/2017  Hearing? N N  Vision? N N  Difficulty concentrating or making decisions? N N  Walking or climbing stairs? N N  Dressing or bathing? N N  Doing errands, shopping? N N  Some recent data might be hidden     Cognitive Testing  Alert? Yes  Normal Appearance?Yes  Oriented to person? Yes  Place? Yes   Time? Yes  Recall of three objects?  Yes  Can perform simple calculations? Yes  Displays appropriate judgment?Yes  Can  read the correct time from a watch face?Yes  EOL planning: Does Patient Have a Medical Advance Directive?: No Would patient like information on creating a medical advance directive?: Yes (ED - Information included in AVS)   Objective:   Today's Vitals   06/13/17 0918  BP: 126/80  Pulse: 63  Resp: 14  Temp: 97.9 F (36.6 C)  SpO2: 95%  Weight: 215 lb 9.6 oz (97.8 kg)  Height: 5\' 7"  (1.702 m)  PainSc: 4   PainLoc: Back   Body mass index is 33.77 kg/m.  General appearance: alert, no distress, WD/WN, male HEENT: normocephalic, sclerae anicteric, TMs pearly, nares patent, no discharge or erythema, pharynx normal Oral cavity: MMM, no lesions Neck: supple, no lymphadenopathy, no thyromegaly, no masses Heart: RRR, normal S1, S2, no murmurs Lungs: CTA bilaterally, no wheezes, rhonchi, or rales Abdomen: +bs, soft, obese non tender, non distended, no masses, no hepatomegaly, no splenomegaly Musculoskeletal: nontender, no swelling, no obvious deformity Extremities: no edema, no cyanosis, no clubbing Pulses: 2+ symmetric, upper and lower extremities, normal cap refill Neurological: alert, oriented x 3, CN2-12 intact, strength normal upper extremities and lower extremities, sensation normal throughout, DTRs 2+ throughout, no cerebellar signs, gait normal Psychiatric: normal affect, behavior normal, pleasant   Medicare Attestation I have personally reviewed: The patient's medical and social history Their use of alcohol, tobacco or illicit drugs Their current medications and supplements The patient's functional ability including ADLs,fall risks, home safety risks, cognitive, and hearing and visual impairment Diet and physical activities Evidence for depression or mood disorders  The patient's weight, height, BMI, and visual acuity have been recorded in the chart.  I have made referrals, counseling, and provided education to the patient based on review of the above and I have provided  the patient with a written personalized care plan for preventive services.     Vicie Mutters, PA-C   06/13/2017

## 2017-06-13 ENCOUNTER — Ambulatory Visit (INDEPENDENT_AMBULATORY_CARE_PROVIDER_SITE_OTHER): Payer: Medicare HMO | Admitting: Physician Assistant

## 2017-06-13 ENCOUNTER — Encounter: Payer: Self-pay | Admitting: Physician Assistant

## 2017-06-13 VITALS — BP 126/80 | HR 63 | Temp 97.9°F | Resp 14 | Ht 67.0 in | Wt 215.6 lb

## 2017-06-13 DIAGNOSIS — E559 Vitamin D deficiency, unspecified: Secondary | ICD-10-CM | POA: Diagnosis not present

## 2017-06-13 DIAGNOSIS — Z0001 Encounter for general adult medical examination with abnormal findings: Secondary | ICD-10-CM

## 2017-06-13 DIAGNOSIS — E782 Mixed hyperlipidemia: Secondary | ICD-10-CM | POA: Diagnosis not present

## 2017-06-13 DIAGNOSIS — I10 Essential (primary) hypertension: Secondary | ICD-10-CM

## 2017-06-13 DIAGNOSIS — R7303 Prediabetes: Secondary | ICD-10-CM

## 2017-06-13 DIAGNOSIS — M1990 Unspecified osteoarthritis, unspecified site: Secondary | ICD-10-CM

## 2017-06-13 DIAGNOSIS — J449 Chronic obstructive pulmonary disease, unspecified: Secondary | ICD-10-CM

## 2017-06-13 DIAGNOSIS — G4733 Obstructive sleep apnea (adult) (pediatric): Secondary | ICD-10-CM | POA: Diagnosis not present

## 2017-06-13 DIAGNOSIS — R6889 Other general symptoms and signs: Secondary | ICD-10-CM | POA: Diagnosis not present

## 2017-06-13 DIAGNOSIS — N529 Male erectile dysfunction, unspecified: Secondary | ICD-10-CM | POA: Diagnosis not present

## 2017-06-13 DIAGNOSIS — M1000 Idiopathic gout, unspecified site: Secondary | ICD-10-CM

## 2017-06-13 DIAGNOSIS — Z79899 Other long term (current) drug therapy: Secondary | ICD-10-CM | POA: Diagnosis not present

## 2017-06-13 DIAGNOSIS — F17200 Nicotine dependence, unspecified, uncomplicated: Secondary | ICD-10-CM | POA: Diagnosis not present

## 2017-06-13 DIAGNOSIS — Z0000 Encounter for general adult medical examination without abnormal findings: Secondary | ICD-10-CM

## 2017-06-13 DIAGNOSIS — R69 Illness, unspecified: Secondary | ICD-10-CM | POA: Diagnosis not present

## 2017-06-13 LAB — CBC WITH DIFFERENTIAL/PLATELET
Basophils Absolute: 81 cells/uL (ref 0–200)
Basophils Relative: 1 %
Eosinophils Absolute: 81 cells/uL (ref 15–500)
Eosinophils Relative: 1 %
HCT: 48.8 % (ref 38.5–50.0)
Hemoglobin: 17 g/dL (ref 13.2–17.1)
Lymphocytes Relative: 28 %
Lymphs Abs: 2268 cells/uL (ref 850–3900)
MCH: 33.7 pg — ABNORMAL HIGH (ref 27.0–33.0)
MCHC: 34.8 g/dL (ref 32.0–36.0)
MCV: 96.6 fL (ref 80.0–100.0)
MPV: 10.7 fL (ref 7.5–12.5)
Monocytes Absolute: 810 cells/uL (ref 200–950)
Monocytes Relative: 10 %
Neutro Abs: 4860 cells/uL (ref 1500–7800)
Neutrophils Relative %: 60 %
Platelets: 155 10*3/uL (ref 140–400)
RBC: 5.05 MIL/uL (ref 4.20–5.80)
RDW: 13.4 % (ref 11.0–15.0)
WBC: 8.1 10*3/uL (ref 3.8–10.8)

## 2017-06-13 LAB — LIPID PANEL
Cholesterol: 153 mg/dL (ref <200)
HDL: 43 mg/dL (ref >40)
LDL Cholesterol: 56 mg/dL (ref <100)
Total CHOL/HDL Ratio: 3.6 Ratio (ref <5.0)
Triglycerides: 271 mg/dL — ABNORMAL HIGH (ref <150)
VLDL: 54 mg/dL — ABNORMAL HIGH (ref <30)

## 2017-06-13 LAB — BASIC METABOLIC PANEL WITH GFR
BUN: 25 mg/dL (ref 7–25)
CO2: 24 mmol/L (ref 20–31)
Calcium: 9.5 mg/dL (ref 8.6–10.3)
Chloride: 106 mmol/L (ref 98–110)
Creat: 1.16 mg/dL (ref 0.70–1.25)
GFR, Est African American: 75 mL/min (ref >=60)
GFR, Est Non African American: 65 mL/min (ref >=60)
Glucose, Bld: 82 mg/dL (ref 65–99)
Potassium: 4.1 mmol/L (ref 3.5–5.3)
Sodium: 140 mmol/L (ref 135–146)

## 2017-06-13 LAB — HEPATIC FUNCTION PANEL
ALT: 42 U/L (ref 9–46)
AST: 32 U/L (ref 10–35)
Albumin: 4.1 g/dL (ref 3.6–5.1)
Alkaline Phosphatase: 58 U/L (ref 40–115)
Bilirubin, Direct: 0.1 mg/dL (ref <=0.2)
Indirect Bilirubin: 0.4 mg/dL (ref 0.2–1.2)
Total Bilirubin: 0.5 mg/dL (ref 0.2–1.2)
Total Protein: 6.6 g/dL (ref 6.1–8.1)

## 2017-06-13 LAB — TSH: TSH: 0.61 mIU/L (ref 0.40–4.50)

## 2017-06-13 NOTE — Patient Instructions (Signed)
If you have a smart phone, please look up Smoke Free app, this will help you stay on track and give you information about money you have saved, life that you have gained back and a ton of more information.   We are giving you chantix for smoking cessation. You can do it! And we are here to help! You may have heard some scary side effects about chantix, the three most common I hear about are nausea, crazy dreams and depression.  However, I like for my patients to try to stay on 1/2 a tablet twice a day rather than one tablet twice a day as normally prescribed. This helps decrease the chances of side effects and helps save money by making a one month prescription last two months  Please start the prescription this way:  Start 1/2 tablet by mouth once daily after food with a full glass of water for 3 days Then do 1/2 tablet by mouth twice daily for 4 days. During this first week you can smoke, but try to stop after this week.  At this point we have several options: 1) continue on 1/2 tablet twice a day- which I encourage you to do. You can stay on this dose the rest of the time on the medication or if you still feel the need to smoke you can do one of the two options below. 2) do one tablet in the morning and 1/2 in the evening which helps decrease dreams. 3) do one tablet twice a day.   What if I miss a dose? If you miss a dose, take it as soon as you can. If it is almost time for your next dose, take only that dose. Do not take double or extra doses.  What should I watch for while using this medicine? Visit your doctor or health care professional for regular check ups. Ask for ongoing advice and encouragement from your doctor or healthcare professional, friends, and family to help you quit. If you smoke while on this medication, quit again  Your mouth may get dry. Chewing sugarless gum or hard candy, and drinking plenty of water may help. Contact your doctor if the problem does not go away or is  severe.  You may get drowsy or dizzy. Do not drive, use machinery, or do anything that needs mental alertness until you know how this medicine affects you. Do not stand or sit up quickly, especially if you are an older patient.   The use of this medicine may increase the chance of suicidal thoughts or actions. Pay special attention to how you are responding while on this medicine. Any worsening of mood, or thoughts of suicide or dying should be reported to your health care professional right away.  ADVANTAGES OF QUITTING SMOKING  Within 20 minutes, blood pressure decreases. Your pulse is at normal level.  After 8 hours, carbon monoxide levels in the blood return to normal. Your oxygen level increases.  After 24 hours, the chance of having a heart attack starts to decrease. Your breath, hair, and body stop smelling like smoke.  After 48 hours, damaged nerve endings begin to recover. Your sense of taste and smell improve.  After 72 hours, the body is virtually free of nicotine. Your bronchial tubes relax and breathing becomes easier.  After 2 to 12 weeks, lungs can hold more air. Exercise becomes easier and circulation improves.  After 1 year, the risk of coronary heart disease is cut in half.  After 5 years,   the risk of stroke falls to the same as a nonsmoker.  After 10 years, the risk of lung cancer is cut in half and the risk of other cancers decreases significantly.  After 15 years, the risk of coronary heart disease drops, usually to the level of a nonsmoker.  You will have extra money to spend on things other than cigarettes.   Simple math prevails.    1st - exercise does not produce significant weight loss - at best one converts fat into muscle , "bulks up", loses inches, but usually stays "weight neutral"     2nd - think of your body weightas a check book: If you eat more calories than you burn up - you save money or gain weight .... Or if you spend more money than you put in  the check book, ie burn up more calories than you eat, then you lose weight     3rd - if you walk or run 1 mile, you burn up 100 calories - you have to burn up 3,500 calories to lose 1 pound, ie you have to walk/run 35 miles to lose 1 measly pound. So if you want to lose 10 #, then you have to walk/run 350 miles, so.... clearly exercise is not the solution.     4. So if you consume 1,500 calories, then you have to burn up the equivalent of 15 miles to stay weight neutral - It also stands to reason that if you consume 1,500 cal/day and don't lose weight, then you must be burning up about 1,500 cals/day to stay weight neutral.     5. If you really want to lose weight, you must cut your calorie intake 300 calories /day and at that rate you should lose about 1 # every 3 days.   6. Please purchase Dr Fara Olden Fuhrman's book(s) "The End of Dieting" & "Eat to Live" . It has some great concepts and recipes.      Drink 100 oz of water a day Eat 3 meals, try to eat protein with breakfast     Bad carbs also include fruit juice, alcohol, and sweet tea. These are empty calories that do not signal to your brain that you are full.   Please remember the good carbs are still carbs which convert into sugar. So please measure them out no more than 1/2-1 cup of rice, oatmeal, pasta, and beans  Veggies are however free foods! Pile them on.   Not all fruit is created equal. Please see the list below, the fruit at the bottom is higher in sugars than the fruit at the top. Please avoid all dried fruits.

## 2017-06-14 DIAGNOSIS — G4733 Obstructive sleep apnea (adult) (pediatric): Secondary | ICD-10-CM | POA: Diagnosis not present

## 2017-06-14 LAB — HEMOGLOBIN A1C
Hgb A1c MFr Bld: 5.4 % (ref <5.7)
Mean Plasma Glucose: 108 mg/dL

## 2017-06-14 LAB — MAGNESIUM: Magnesium: 1.7 mg/dL (ref 1.5–2.5)

## 2017-06-16 ENCOUNTER — Encounter: Payer: Self-pay | Admitting: Internal Medicine

## 2017-06-29 ENCOUNTER — Ambulatory Visit (HOSPITAL_COMMUNITY)
Admission: RE | Admit: 2017-06-29 | Discharge: 2017-06-29 | Disposition: A | Discharge status: Home / Self Care | Payer: Medicare HMO | Source: Ambulatory Visit | Attending: Physician Assistant | Admitting: Physician Assistant

## 2017-06-29 DIAGNOSIS — I700 Atherosclerosis of aorta: Secondary | ICD-10-CM | POA: Diagnosis not present

## 2017-06-29 DIAGNOSIS — J432 Centrilobular emphysema: Secondary | ICD-10-CM | POA: Diagnosis not present

## 2017-06-29 DIAGNOSIS — K760 Fatty (change of) liver, not elsewhere classified: Secondary | ICD-10-CM | POA: Insufficient documentation

## 2017-06-29 DIAGNOSIS — I2510 Atherosclerotic heart disease of native coronary artery without angina pectoris: Secondary | ICD-10-CM | POA: Diagnosis not present

## 2017-06-29 DIAGNOSIS — F17200 Nicotine dependence, unspecified, uncomplicated: Secondary | ICD-10-CM | POA: Diagnosis not present

## 2017-06-29 DIAGNOSIS — R69 Illness, unspecified: Secondary | ICD-10-CM | POA: Diagnosis not present

## 2017-06-29 DIAGNOSIS — M47814 Spondylosis without myelopathy or radiculopathy, thoracic region: Secondary | ICD-10-CM | POA: Diagnosis not present

## 2017-06-29 DIAGNOSIS — Z8546 Personal history of malignant neoplasm of prostate: Secondary | ICD-10-CM | POA: Diagnosis not present

## 2017-06-29 DIAGNOSIS — Z122 Encounter for screening for malignant neoplasm of respiratory organs: Secondary | ICD-10-CM | POA: Diagnosis not present

## 2017-06-30 ENCOUNTER — Encounter: Payer: Self-pay | Admitting: Physician Assistant

## 2017-06-30 DIAGNOSIS — I700 Atherosclerosis of aorta: Secondary | ICD-10-CM | POA: Insufficient documentation

## 2017-08-10 DIAGNOSIS — R69 Illness, unspecified: Secondary | ICD-10-CM | POA: Diagnosis not present

## 2017-09-19 ENCOUNTER — Encounter: Payer: Self-pay | Admitting: Internal Medicine

## 2017-09-19 ENCOUNTER — Ambulatory Visit (INDEPENDENT_AMBULATORY_CARE_PROVIDER_SITE_OTHER): Payer: Medicare HMO | Admitting: Internal Medicine

## 2017-09-19 VITALS — BP 140/84 | HR 64 | Temp 97.3°F | Resp 18 | Ht 66.5 in | Wt 212.0 lb

## 2017-09-19 DIAGNOSIS — I10 Essential (primary) hypertension: Secondary | ICD-10-CM

## 2017-09-19 DIAGNOSIS — Z136 Encounter for screening for cardiovascular disorders: Secondary | ICD-10-CM | POA: Diagnosis not present

## 2017-09-19 DIAGNOSIS — E559 Vitamin D deficiency, unspecified: Secondary | ICD-10-CM | POA: Diagnosis not present

## 2017-09-19 DIAGNOSIS — Z0001 Encounter for general adult medical examination with abnormal findings: Secondary | ICD-10-CM

## 2017-09-19 DIAGNOSIS — Z79899 Other long term (current) drug therapy: Secondary | ICD-10-CM

## 2017-09-19 DIAGNOSIS — Z1211 Encounter for screening for malignant neoplasm of colon: Secondary | ICD-10-CM

## 2017-09-19 DIAGNOSIS — E782 Mixed hyperlipidemia: Secondary | ICD-10-CM

## 2017-09-19 DIAGNOSIS — F17200 Nicotine dependence, unspecified, uncomplicated: Secondary | ICD-10-CM

## 2017-09-19 DIAGNOSIS — Z1212 Encounter for screening for malignant neoplasm of rectum: Secondary | ICD-10-CM

## 2017-09-19 DIAGNOSIS — M1000 Idiopathic gout, unspecified site: Secondary | ICD-10-CM | POA: Diagnosis not present

## 2017-09-19 DIAGNOSIS — J449 Chronic obstructive pulmonary disease, unspecified: Secondary | ICD-10-CM

## 2017-09-19 DIAGNOSIS — R7303 Prediabetes: Secondary | ICD-10-CM | POA: Diagnosis not present

## 2017-09-19 DIAGNOSIS — I700 Atherosclerosis of aorta: Secondary | ICD-10-CM

## 2017-09-19 DIAGNOSIS — Z0000 Encounter for general adult medical examination without abnormal findings: Secondary | ICD-10-CM | POA: Diagnosis not present

## 2017-09-19 DIAGNOSIS — Z125 Encounter for screening for malignant neoplasm of prostate: Secondary | ICD-10-CM

## 2017-09-19 NOTE — Patient Instructions (Signed)

## 2017-09-19 NOTE — Progress Notes (Addendum)
[facility]   [name], M.D.     [name], P.A.-C Liane Comber, [place]                [address] [place] [address] Telephone [phone] Telefax [phone] Annual  Screening/Preventative Visit  & Comprehensive Evaluation & Examination     This very nice 66 y.o. MWM presents for a Screening/Preventative Visit & comprehensive evaluation and management of multiple medical co-morbidities.  Patient has been followed for HTN, Prediabetes, Hyperlipidemia and Vitamin D Deficiency. Patient also has hx/o Gout controlled w/Allopurinol. Patient also has OSA and is on CPAP with improved sleep hygiene and restorative sleep. Patient had a negative colonoscopy in Oct 2017 by Dr Mellody Memos. Patient also relates hx/o LBP and has had EDSI by Dr Mina Marble and has had Rt Carpal tunnel steroid injections by Dr Rip Harbour. He reports intermittent painful dysthesias of the classic Carpal tunnel sx's.      HTN predates circa the 1980's. Patient's BP has been controlled at home.  Today's BP is at goal - 140/84. Patient denies any cardiac symptoms as chest pain, palpitations, shortness of breath, dizziness or ankle swelling.  LD Chest CT showed Ao Arch Atherosclerotic calcification.      Patient's hyperlipidemia is controlled with diet and medications. Patient denies myalgias or other medication SE's. Last lipids were at goal albeit elevated Trig's: Lab Results  Component Value Date   CHOL 153 06/13/2017   HDL 43 06/13/2017   LDLCALC 56 06/13/2017   TRIG 271 (H) 06/13/2017   CHOLHDL 3.6 06/13/2017      Patient has prediabetes since    and patient denies reactive hypoglycemic symptoms, visual blurring, diabetic polys or paresthesias. Last A1c was at goal: Lab Results  Component Value Date   HGBA1C 5.4 06/13/2017       Finally, patient has history of Vitamin D Deficiency of    and last vitamin D was  at goal: Lab Results  Component Value Date   VD25OH 7 02/18/2017   Current Outpatient Prescriptions on File Prior to Visit  Medication Sig  . allopurinol (ZYLOPRIM) 300 MG tablet Take 1 tablet (300 mg total) by mouth daily.  . bisoprolol-hydrochlorothiazide (ZIAC) 10-6.25 MG tablet TAKE 1 TABLET BY MOUTH EVERY DAY FOR BLOOD PRESSURE  . Cholecalciferol (VITAMIN D PO) Take 10,000 Units by mouth daily.   . clonazePAM (KLONOPIN) 1 MG tablet TAKE 1/2 TO 1 TABLET BY MOUTH AT BEDTIME  . KRILL OIL PO Take 350 mg by mouth 2 times daily at 12 noon and 4 pm.   . losartan-hydrochlorothiazide (HYZAAR) 100-25 MG tablet Take 1 tablet by mouth daily.  . Magnesium 400 MG CAPS Take by mouth.   . meloxicam (MOBIC) 15 MG tablet TAKE 1 TABLET BY MOUTH ONCE DAILY WITH FOOD FOR ARTHRITIS PAIN AND INFLAMMATION  . rosuvastatin (CRESTOR) 40 MG tablet TAKE 1 TABLET (40 MG TOTAL) BY MOUTH DAILY.   No current facility-administered medications on file prior to visit.    Allergies  Allergen Reactions  . Lidocaine     blisters   Past Medical History:  Diagnosis Date  . Colon polyps   . DJD (degenerative joint disease)   . ED (erectile dysfunction)   . Gout   . Hyperlipemia   . Hypertension   . Obstructive sleep apnea    08/2012, cpap setting  of 14  . Prostate cancer ([place]) 01/2002   s/p prostatectomy  . Vitamin D deficiency    Health Maintenance  Topic Date Due  . INFLUENZA VACCINE  06/29/2017  . Fecal DNA (Cologuard)  08/10/2018  . TETANUS/TDAP  08/12/2020  . Hepatitis C Screening  Completed  . PNA vac Low Risk Adult  Completed   Immunization History  Administered Date(s) Administered  . Influenza Split 08/26/2014  . Influenza, High Dose Seasonal PF 08/12/2016  . Influenza, Seasonal, Injecte, Preservative Fre 10/27/2015  . PPD Test 05/23/2014, 07/23/2015  . Pneumococcal Conjugate-13 08/26/2014  . Pneumococcal Polysaccharide-23 05/05/2010, 01/30/2016  . Td 11/30/2003  . Tdap 08/12/2010  .  Zoster 04/29/2013   Past Surgical History:  Procedure Laterality Date  . COLONOSCOPY WITH PROPOFOL N/A 09/06/2016   Procedure: COLONOSCOPY WITH PROPOFOL;  Surgeon: Garlan Fair, MD;  Location: [facility];  Service: Endoscopy;  Laterality: N/A;  . colonscopy     x 3 with polyp one time  . PROSTATECTOMY  2003   Family History  Problem Relation Age of Onset  . Hypertension Mother    Social History  . Marital status: Married    Spouse name: N/A  . Number of children: N/A  . Years of education: N/A   Occupational History  . retired   Social History Main Topics  . Smoking status: Current Every Day Smoker    Packs/day: 1.00    Years: 45.00    Types: Cigarettes  . Smokeless tobacco: Never Used  . Alcohol use 9.0 oz/week    15 Standard drinks or equivalent per week     Comment: 2-3 mixed drinks per day  . Drug use: No  . Sexual activity: Not on file    ROS Constitutional: Denies fever, chills, weight loss/gain, headaches, insomnia,  night sweats or change in appetite. Does c/o fatigue. Eyes: Denies redness, blurred vision, diplopia, discharge, itchy or watery eyes.  ENT: Denies discharge, congestion, post nasal drip, epistaxis, sore throat, earache, hearing loss, dental pain, Tinnitus, Vertigo, Sinus pain or snoring.  Cardio: Denies chest pain, palpitations, irregular heartbeat, syncope, dyspnea, diaphoresis, orthopnea, PND, claudication or edema Respiratory: denies cough, dyspnea, DOE, pleurisy, hoarseness, laryngitis or wheezing.  Gastrointestinal: Denies dysphagia, heartburn, reflux, water brash, pain, cramps, nausea, vomiting, bloating, diarrhea, constipation, hematemesis, melena, hematochezia, jaundice or hemorrhoids Genitourinary: Denies dysuria, frequency, urgency, nocturia, hesitancy, discharge, hematuria or flank pain Musculoskeletal: Denies arthralgia, myalgia, stiffness, Jt. Swelling, pain, limp or strain/sprain. Denies Falls. Skin: Denies puritis, rash, hives,  warts, acne, eczema or change in skin lesion Neuro: No weakness, tremor, incoordination, spasms, paresthesia or pain Psychiatric: Denies confusion, memory loss or sensory loss. Denies Depression. Endocrine: Denies change in weight, skin, hair change, nocturia, and paresthesia, diabetic polys, visual blurring or hyper / hypo glycemic episodes.  Heme/Lymph: No excessive bleeding, bruising or enlarged lymph nodes.  Physical Exam  BP 140/84   Pulse 64   Temp (!) 97.3 F (36.3 C)   Resp 18   Ht 5' 6.5" (1.689 m)   Wt 212 lb (96.2 kg)   BMI 33.71 kg/m   General Appearance: Well nourished and well groomed and in no apparent distress.  Eyes: PERRLA, EOMs, conjunctiva no swelling or erythema, normal fundi and vessels. Sinuses: No frontal/maxillary tenderness ENT/Mouth: EACs patent / TMs  nl. Nares clear without erythema, swelling, mucoid exudates. Oral hygiene is good. No erythema, swelling, or exudate. Tongue normal, non-obstructing. Tonsils not swollen or erythematous. Hearing normal.  Neck: Supple, thyroid normal. No bruits,  nodes or JVD. Respiratory: Respiratory effort normal.  BS equal and clear bilateral without rales, rhonci, wheezing or stridor. Cardio: Heart sounds are normal with regular rate and rhythm and no murmurs, rubs or gallops. Peripheral pulses are normal and equal bilaterally without edema. No aortic or femoral bruits. Chest: symmetric with normal excursions and percussion.  Abdomen: Soft, with Nl bowel sounds. Nontender, no guarding, rebound, hernias, masses, or organomegaly.  Lymphatics: Non tender without lymphadenopathy.  Genitourinary: No hernias.Testes nl. DRE - prostate nl for age - smooth & firm w/o nodules. Musculoskeletal: Full ROM all peripheral extremities, joint stability, 5/5 strength, and normal gait. Skin: Warm and dry without rashes, lesions, cyanosis, clubbing or  ecchymosis.  Neuro: Cranial nerves intact, reflexes equal bilaterally. Normal muscle tone, no  cerebellar symptoms. Sensation intact.  Pysch: Alert and oriented X 3 with normal affect, insight and judgment appropriate.   Assessment and Plan  1. Annual Preventative/Screening Exam   1. Encounter for general adult medical examination with abnormal findings   2. Essential hypertension  - EKG 12-Lead - [place], RETROPERITNL ABD,  LTD - Urinalysis, Routine w reflex microscopic - Microalbumin / creatinine urine ratio - CBC with Differential/Platelet - BASIC METABOLIC PANEL WITH GFR - Magnesium - TSH  3. Hyperlipidemia, mixed  - EKG 12-Lead - [place], RETROPERITNL ABD,  LTD - Hepatic function panel - Lipid panel - TSH  4. Prediabetes  - EKG 12-Lead - [place], RETROPERITNL ABD,  LTD - Hemoglobin A1c - Insulin, random  5. Vitamin D deficiency  - VITAMIN D 25 Hydroxy (Vit-D Deficiency, Fractures)  6. Idiopathic gout, unspecified chronicity, unspecified site  - Uric acid  7. Chronic obstructive pulmonary disease, unspecified COPD type ([place])   8. Smoker  - [place], RETROPERITNL ABD,  LTD  9. Screening for colorectal cancer  - POC Hemoccult Bld/Stl (3-Cd Home Screen); Future  10. Prostate cancer screening  - PSA  11. Screening for ischemic heart disease  - EKG 12-Lead  12. Screening for AAA (aortic abdominal aneurysm)  - [place], RETROPERITNL ABD,  LTD  13. Medication management  - Urinalysis, Routine w reflex microscopic - Microalbumin / creatinine urine ratio - CBC with Differential/Platelet - BASIC METABOLIC PANEL WITH GFR - Hepatic function panel - Magnesium - Lipid panel - TSH - Hemoglobin A1c - Insulin, random - VITAMIN D 25 Hydroxy (Vit-D Deficiency, Fractures) - Uric acid  14. Aortic atherosclerosis ([place])       Patient was counseled in prudent diet, weight control to achieve/maintain BMI less than 25, BP monitoring, regular exercise and medications as discussed.  Discussed med effects and SE's. Routine screening labs and tests as requested with regular  follow-up as recommended. Over 40 minutes of exam, counseling, chart review and high complex critical decision making was performed

## 2017-09-20 DIAGNOSIS — G4733 Obstructive sleep apnea (adult) (pediatric): Secondary | ICD-10-CM | POA: Diagnosis not present

## 2017-09-20 LAB — HEMOGLOBIN A1C
Hgb A1c MFr Bld: 5.1 % of total Hgb (ref <5.7)
Mean Plasma Glucose: 100 (calc)
eAG (mmol/L): 5.5 (calc)

## 2017-09-20 LAB — BASIC METABOLIC PANEL WITH GFR
BUN/Creatinine Ratio: 20 (calc) (ref 6–22)
BUN: 26 mg/dL — ABNORMAL HIGH (ref 7–25)
CO2: 28 mmol/L (ref 20–32)
Calcium: 9.8 mg/dL (ref 8.6–10.3)
Chloride: 102 mmol/L (ref 98–110)
Creat: 1.28 mg/dL — ABNORMAL HIGH (ref 0.70–1.25)
GFR, Est African American: 67 mL/min/{1.73_m2} (ref >=60)
GFR, Est Non African American: 58 mL/min/{1.73_m2} — ABNORMAL LOW (ref >=60)
Glucose, Bld: 86 mg/dL (ref 65–99)
Potassium: 4.5 mmol/L (ref 3.5–5.3)
Sodium: 140 mmol/L (ref 135–146)

## 2017-09-20 LAB — CBC WITH DIFFERENTIAL/PLATELET
Basophils Absolute: 60 cells/uL (ref 0–200)
Basophils Relative: 0.7 %
Eosinophils Absolute: 120 cells/uL (ref 15–500)
Eosinophils Relative: 1.4 %
HCT: 52.4 % — ABNORMAL HIGH (ref 38.5–50.0)
Hemoglobin: 18.9 g/dL — ABNORMAL HIGH (ref 13.2–17.1)
Lymphs Abs: 2743 cells/uL (ref 850–3900)
MCH: 33.6 pg — ABNORMAL HIGH (ref 27.0–33.0)
MCHC: 36.1 g/dL — ABNORMAL HIGH (ref 32.0–36.0)
MCV: 93.2 fL (ref 80.0–100.0)
MPV: 11.5 fL (ref 7.5–12.5)
Monocytes Relative: 9 %
Neutro Abs: 4902 cells/uL (ref 1500–7800)
Neutrophils Relative %: 57 %
Platelets: 175 10*3/uL (ref 140–400)
RBC: 5.62 10*6/uL (ref 4.20–5.80)
RDW: 12.7 % (ref 11.0–15.0)
Total Lymphocyte: 31.9 %
WBC mixed population: 774 cells/uL (ref 200–950)
WBC: 8.6 10*3/uL (ref 3.8–10.8)

## 2017-09-20 LAB — URINALYSIS, ROUTINE W REFLEX MICROSCOPIC
Bilirubin Urine: NEGATIVE
Glucose, UA: NEGATIVE
Hgb urine dipstick: NEGATIVE
Ketones, ur: NEGATIVE
Leukocytes, UA: NEGATIVE
Nitrite: NEGATIVE
Protein, ur: NEGATIVE
Specific Gravity, Urine: 1.019 (ref 1.001–1.03)
pH: <=5 (ref 5.0–8.0)

## 2017-09-20 LAB — HEPATIC FUNCTION PANEL
AG Ratio: 1.6 (calc) (ref 1.0–2.5)
ALT: 21 U/L (ref 9–46)
AST: 18 U/L (ref 10–35)
Albumin: 4.3 g/dL (ref 3.6–5.1)
Alkaline phosphatase (APISO): 65 U/L (ref 40–115)
Bilirubin, Direct: 0.1 mg/dL (ref 0.0–0.2)
Globulin: 2.7 g/dL (calc) (ref 1.9–3.7)
Indirect Bilirubin: 0.4 mg/dL (calc) (ref 0.2–1.2)
Total Bilirubin: 0.5 mg/dL (ref 0.2–1.2)
Total Protein: 7 g/dL (ref 6.1–8.1)

## 2017-09-20 LAB — LIPID PANEL
Cholesterol: 242 mg/dL — ABNORMAL HIGH (ref <200)
HDL: 35 mg/dL — ABNORMAL LOW (ref >40)
Non-HDL Cholesterol (Calc): 207 mg/dL (calc) — ABNORMAL HIGH (ref <130)
Total CHOL/HDL Ratio: 6.9 (calc) — ABNORMAL HIGH (ref <5.0)
Triglycerides: 454 mg/dL — ABNORMAL HIGH (ref <150)

## 2017-09-20 LAB — MICROALBUMIN / CREATININE URINE RATIO
Creatinine, Urine: 114 mg/dL (ref 20–320)
Microalb Creat Ratio: 74 mcg/mg creat — ABNORMAL HIGH (ref <30)
Microalb, Ur: 8.4 mg/dL

## 2017-09-20 LAB — PSA: PSA: 0.2 ng/mL (ref <=4.0)

## 2017-09-20 LAB — URIC ACID: Uric Acid, Serum: 6.4 mg/dL (ref 4.0–8.0)

## 2017-09-20 LAB — TSH: TSH: 1.14 mIU/L (ref 0.40–4.50)

## 2017-09-20 LAB — VITAMIN D 25 HYDROXY (VIT D DEFICIENCY, FRACTURES): Vit D, 25-Hydroxy: 64 ng/mL (ref 30–100)

## 2017-09-20 LAB — INSULIN, RANDOM: Insulin: 23.5 u[IU]/mL — ABNORMAL HIGH (ref 2.0–19.6)

## 2017-09-20 LAB — MAGNESIUM: Magnesium: 1.8 mg/dL (ref 1.5–2.5)

## 2017-10-04 ENCOUNTER — Other Ambulatory Visit: Payer: Self-pay | Admitting: Internal Medicine

## 2017-11-14 ENCOUNTER — Other Ambulatory Visit: Payer: Self-pay | Admitting: Internal Medicine

## 2017-11-17 ENCOUNTER — Other Ambulatory Visit: Payer: Self-pay | Admitting: Internal Medicine

## 2017-12-27 DIAGNOSIS — G4733 Obstructive sleep apnea (adult) (pediatric): Secondary | ICD-10-CM | POA: Diagnosis not present

## 2017-12-27 NOTE — Progress Notes (Signed)
FOLLOW UP  Assessment and Plan:   Hypertension Well controlled with current medications  Monitor blood pressure at home; patient to call if consistently greater than 130/80 Continue DASH diet.   Reminder to go to the ER if any CP, SOB, nausea, dizziness, severe HA, changes vision/speech, left arm numbness and tingling and jaw pain.  Cholesterol LDL at goal; continue statin; triglycerides severely elevated at last visit - discussed at length, dietary information provided Discussed possible fenofibrate should trigs continue to remain severely elevated  Continue low cholesterol diet and exercise.  Check lipid panel.   Other abnormal glucose Recent A1Cs well controlled -  Continue diet and exercise.  Perform daily foot/skin check, notify office of any concerning changes.  Defer A1C; check BMP  Obesity with co morbidities Long discussion about weight loss, diet, and exercise Recommended diet heavy in fruits and veggies and low in animal meats, cheeses, and dairy products, appropriate calorie intake Discussed ideal weight for height Patient will work on continuing portion control, increasing fruits and vegetables, intentional exercise Will follow up in 3 months  COPD/smoking Advised to stop smoking - risks discussed. Declines medications but plans to quit cold [place] soon. CXR annually Currently stable without respiratory medications  Vitamin D Def At goal at last visit; continue supplementation to maintain goal of 70-100 Defer Vit D level  Acute nasopharyngitis Benign exam - Discussed the importance of avoiding unnecessary antibiotic therapy. Suggested symptomatic OTC remedies. Nasal saline spray for congestion. Nasal steroids, allergy pill, oral steroids Promethazine- DM cough syrup prescribed Follow up as needed. zpak and prednisone taper printed and provided should symptoms worsen significantly   Continue diet and meds as discussed. Further disposition pending results  of labs. Discussed med's effects and SE's.   Over 30 minutes of exam, counseling, chart review, and critical decision making was performed.   Future Appointments  Date Time Provider Jennette  04/06/2018  9:30 AM [name], MD [facility] None  10/17/2018  2:00 PM [name], MD [facility] None    ----------------------------------------------------------------------------------------------------------------------  HPI 67 y.o. male  presents for 3 month follow up on hypertension, cholesterol, glucose management (hx of prediabetes), obesity, COPD and vitamin D deficiency. Patient also complains  of symptoms of a URI, possible sinusitis. Symptoms include congestion, facial pain, non productive cough and purulent nasal discharge. Onset of symptoms was 5 days ago, and has been stable since that time. Treatment to date: OTC combination decongestant..  he has a diagnosis of COPD but currently continues to smoke 0.75 pack a day; discussed risks associated with smoking, patient is ready to quit and is planning to quit cold [place] [birth year] - he does not plan to use medication to help with this, declines today.   BMI is Body mass index is 33.39 kg/m., he has been working on diet- has been working on cutting back on portions. He does not currently exercise intentionally.  Wt Readings from Last 3 Encounters:  12/28/17 210 lb (95.3 kg)  09/19/17 212 lb (96.2 kg)  06/13/17 215 lb 9.6 oz (97.8 kg)   His blood pressure has been controlled at home, today their BP is BP: 132/82  He does not workout. He denies chest pain, shortness of breath, dizziness.   He is on cholesterol medication and denies myalgias. His LDL cholesterol is at goal; triglycerides are not at goal. He reports he drinks about 4 ounces of burbon or scotch daily. The cholesterol last visit was:   Lab Results  Component Value  Date   CHOL 242 (H) 09/19/2017   HDL 35 (L) 09/19/2017   LDLCALC 56 06/13/2017    TRIG 454 (H) 09/19/2017   CHOLHDL 6.9 (H) 09/19/2017    He has been working on diet for glucose management, and denies increased appetite, nausea, paresthesia of the feet, polydipsia, polyuria, visual disturbances, vomiting and weight loss. Last A1C in the office was:  Lab Results  Component Value Date   HGBA1C 5.1 09/19/2017   Patient is on Vitamin D supplement and near goal at most recent check:    Lab Results  Component Value Date   VD25OH 64 09/19/2017        Current Medications:  Current Outpatient Medications on File Prior to Visit  Medication Sig  . allopurinol (ZYLOPRIM) 300 MG tablet TAKE 1 TABLET (300 MG TOTAL) BY MOUTH DAILY.  . bisoprolol-hydrochlorothiazide (ZIAC) 10-6.25 MG tablet TAKE 1 TABLET BY MOUTH EVERY DAY FOR BLOOD PRESSURE  . Cholecalciferol (VITAMIN D PO) Take 10,000 Units by mouth daily.   . clonazePAM (KLONOPIN) 1 MG tablet TAKE 1/2 TO 1 TABLET BY MOUTH AT BEDTIME  . KRILL OIL PO Take 350 mg by mouth 2 times daily at 12 noon and 4 pm.   . losartan-hydrochlorothiazide (HYZAAR) 100-25 MG tablet TAKE 1 TABLET BY MOUTH DAILY.  . Magnesium 400 MG CAPS Take by mouth.   . meloxicam (MOBIC) 15 MG tablet TAKE 1 TABLET BY MOUTH ONCE DAILY WITH FOOD FOR ARTHRITIS PAIN AND INFLAMMATION  . rosuvastatin (CRESTOR) 40 MG tablet TAKE 1 TABLET (40 MG TOTAL) BY MOUTH DAILY.   No current facility-administered medications on file prior to visit.      Allergies:  Allergies  Allergen Reactions  . Lidocaine     blisters     Medical History:  Past Medical History:  Diagnosis Date  . Colon polyps   . DJD (degenerative joint disease)   . ED (erectile dysfunction)   . Gout   . Hyperlipemia   . Hypertension   . Obstructive sleep apnea    08/2012, cpap setting of 14  . Prostate cancer ([place]) 01/2002   s/p prostatectomy  . Vitamin D deficiency    Family history- Reviewed and unchanged Social history- Reviewed and unchanged   Review of Systems:  Review of Systems   Constitutional: Negative for malaise/fatigue and weight loss.  HENT: Positive for congestion. Negative for hearing loss, nosebleeds, sore throat and tinnitus.   Eyes: Negative for blurred vision and double vision.  Respiratory: Positive for cough (Mild, nonproductive). Negative for shortness of breath and wheezing.   Cardiovascular: Negative for chest pain, palpitations, orthopnea, claudication and leg swelling.  Gastrointestinal: Positive for diarrhea (4 episodes in past 2 days - no mucoid discharge or blood). Negative for abdominal pain, blood in stool, constipation, heartburn, melena, nausea and vomiting.  Genitourinary: Negative.   Musculoskeletal: Negative for joint pain and myalgias.  Skin: Negative for rash.  Neurological: Negative for dizziness, tingling, sensory change, weakness and headaches.  Endo/Heme/Allergies: Negative for environmental allergies and polydipsia.  Psychiatric/Behavioral: Negative.   All other systems reviewed and are negative.     Physical Exam: BP 132/82   Pulse 62   Temp (!) 97.5 F (36.4 C)   Ht 5' 6.5" (1.689 m)   Wt 210 lb (95.3 kg)   SpO2 97%   BMI 33.39 kg/m  Wt Readings from Last 3 Encounters:  12/28/17 210 lb (95.3 kg)  09/19/17 212 lb (96.2 kg)  06/13/17 215 lb 9.6 oz (97.8  kg)   General Appearance: Well nourished, in no apparent distress. Eyes: PERRLA, EOMs, conjunctiva no swelling or erythema Sinuses: No Frontal/maxillary tenderness ENT/Mouth: Ext aud canals clear, TMs without erythema, bulging. No erythema, swelling, or exudate on post pharynx.  Tonsils not swollen or erythematous. Hearing normal.  Neck: Supple, thyroid normal.  Respiratory: Respiratory effort normal, BS equal bilaterally with scant coarse wheezes over bronchial areas, no rales, rhonchi, fine wheezing or stridor.  Cardio: RRR with no MRGs. Brisk peripheral pulses without edema.  Abdomen: Soft, rounded, + BS.  Non tender, no guarding, rebound, hernias,  masses. Lymphatics: Non tender without lymphadenopathy.  Musculoskeletal: Full ROM, 5/5 strength, Normal gait Skin: Warm, dry without rashes, lesions, ecchymosis.  Neuro: Cranial nerves intact. No cerebellar symptoms.  Psych: Awake and oriented X 3, normal affect, Insight and Judgment appropriate.    Izora Ribas, NP 9:50 AM [facility]

## 2017-12-28 ENCOUNTER — Encounter: Payer: Self-pay | Admitting: Adult Health

## 2017-12-28 ENCOUNTER — Ambulatory Visit (INDEPENDENT_AMBULATORY_CARE_PROVIDER_SITE_OTHER): Payer: Medicare HMO | Admitting: Adult Health

## 2017-12-28 VITALS — BP 132/82 | HR 62 | Temp 97.5°F | Ht 66.5 in | Wt 210.0 lb

## 2017-12-28 DIAGNOSIS — J449 Chronic obstructive pulmonary disease, unspecified: Secondary | ICD-10-CM | POA: Diagnosis not present

## 2017-12-28 DIAGNOSIS — E669 Obesity, unspecified: Secondary | ICD-10-CM | POA: Diagnosis not present

## 2017-12-28 DIAGNOSIS — J00 Acute nasopharyngitis [common cold]: Secondary | ICD-10-CM | POA: Diagnosis not present

## 2017-12-28 DIAGNOSIS — Z79899 Other long term (current) drug therapy: Secondary | ICD-10-CM | POA: Diagnosis not present

## 2017-12-28 DIAGNOSIS — R7309 Other abnormal glucose: Secondary | ICD-10-CM

## 2017-12-28 DIAGNOSIS — F17200 Nicotine dependence, unspecified, uncomplicated: Secondary | ICD-10-CM | POA: Diagnosis not present

## 2017-12-28 DIAGNOSIS — E559 Vitamin D deficiency, unspecified: Secondary | ICD-10-CM

## 2017-12-28 DIAGNOSIS — R69 Illness, unspecified: Secondary | ICD-10-CM | POA: Diagnosis not present

## 2017-12-28 DIAGNOSIS — I10 Essential (primary) hypertension: Secondary | ICD-10-CM | POA: Diagnosis not present

## 2017-12-28 DIAGNOSIS — E785 Hyperlipidemia, unspecified: Secondary | ICD-10-CM

## 2017-12-28 LAB — HEPATIC FUNCTION PANEL
AG Ratio: 1.5 (calc) (ref 1.0–2.5)
ALT: 27 U/L (ref 9–46)
AST: 25 U/L (ref 10–35)
Albumin: 4 g/dL (ref 3.6–5.1)
Alkaline phosphatase (APISO): 71 U/L (ref 40–115)
Bilirubin, Direct: 0.1 mg/dL (ref 0.0–0.2)
Globulin: 2.7 g/dL (calc) (ref 1.9–3.7)
Indirect Bilirubin: 0.4 mg/dL (calc) (ref 0.2–1.2)
Total Bilirubin: 0.5 mg/dL (ref 0.2–1.2)
Total Protein: 6.7 g/dL (ref 6.1–8.1)

## 2017-12-28 LAB — CBC WITH DIFFERENTIAL/PLATELET
Basophils Absolute: 59 cells/uL (ref 0–200)
Basophils Relative: 0.7 %
Eosinophils Absolute: 118 cells/uL (ref 15–500)
Eosinophils Relative: 1.4 %
HCT: 50.6 % — ABNORMAL HIGH (ref 38.5–50.0)
Hemoglobin: 17.9 g/dL — ABNORMAL HIGH (ref 13.2–17.1)
Lymphs Abs: 2722 cells/uL (ref 850–3900)
MCH: 32.7 pg (ref 27.0–33.0)
MCHC: 35.4 g/dL (ref 32.0–36.0)
MCV: 92.3 fL (ref 80.0–100.0)
MPV: 10.4 fL (ref 7.5–12.5)
Monocytes Relative: 7.4 %
Neutro Abs: 4880 cells/uL (ref 1500–7800)
Neutrophils Relative %: 58.1 %
Platelets: 158 10*3/uL (ref 140–400)
RBC: 5.48 10*6/uL (ref 4.20–5.80)
RDW: 12.5 % (ref 11.0–15.0)
Total Lymphocyte: 32.4 %
WBC mixed population: 622 cells/uL (ref 200–950)
WBC: 8.4 10*3/uL (ref 3.8–10.8)

## 2017-12-28 LAB — BASIC METABOLIC PANEL WITH GFR
BUN/Creatinine Ratio: 16 (calc) (ref 6–22)
BUN: 21 mg/dL (ref 7–25)
CO2: 29 mmol/L (ref 20–32)
Calcium: 9.5 mg/dL (ref 8.6–10.3)
Chloride: 102 mmol/L (ref 98–110)
Creat: 1.34 mg/dL — ABNORMAL HIGH (ref 0.70–1.25)
GFR, Est African American: 64 mL/min/{1.73_m2} (ref >=60)
GFR, Est Non African American: 55 mL/min/{1.73_m2} — ABNORMAL LOW (ref >=60)
Glucose, Bld: 119 mg/dL — ABNORMAL HIGH (ref 65–99)
Potassium: 4 mmol/L (ref 3.5–5.3)
Sodium: 141 mmol/L (ref 135–146)

## 2017-12-28 LAB — LIPID PANEL
Cholesterol: 165 mg/dL (ref <200)
HDL: 33 mg/dL — ABNORMAL LOW (ref >40)
LDL Cholesterol (Calc): 93 mg/dL (calc)
Non-HDL Cholesterol (Calc): 132 mg/dL (calc) — ABNORMAL HIGH (ref <130)
Total CHOL/HDL Ratio: 5 (calc) — ABNORMAL HIGH (ref <5.0)
Triglycerides: 274 mg/dL — ABNORMAL HIGH (ref <150)

## 2017-12-28 LAB — TSH: TSH: 0.93 mIU/L (ref 0.40–4.50)

## 2017-12-28 MED ORDER — AZITHROMYCIN 250 MG PO TABS: ORAL_TABLET | ORAL | 1 refills | Status: AC

## 2017-12-28 MED ORDER — PROMETHAZINE-DM 6.25-15 MG/5ML PO SYRP: 5.0000 mL | ORAL_SOLUTION | Freq: Four times a day (QID) | ORAL | 1 refills | Status: DC | PRN

## 2017-12-28 MED ORDER — PREDNISONE 20 MG PO TABS: ORAL_TABLET | ORAL | 0 refills | Status: DC

## 2017-12-28 NOTE — Patient Instructions (Addendum)
Your symptoms are suggestive of a mild viral cold. I've sent in a cough syrup with decongestant should you need it.   Please start on mucinex/guaifenesin, daily allergy medication, flonase until symptoms resolve. Drink plenty of fluids, plenty of fruits and vegetables.   Saline nasal sprays/irrigation for congestion.  Start antibiotic and prednisone if symptoms worsen significantly or you have a fever.   Go to ER for significant chest pain or shortness of breath.   HOW TO TREAT VIRAL COUGH AND COLD SYMPTOMS:  -Symptoms usually last at least 1 week with the worst symptoms being around day 4.  - colds usually start with a sore throat and end with a cough, and the cough can take 2 weeks to get better.  -No antibiotics are needed for colds, flu, sore throats, cough, bronchitis UNLESS symptoms are longer than 7 days OR if you are getting better then get drastically worse.  -There are a lot of combination medications (Dayquil, Nyquil, Vicks 44, tyelnol cold and sinus, ETC). Please look at the ingredients on the back so that you are treating the correct symptoms and not doubling up on medications/ingredients.    Medicines you can use  Nasal congestion  Little Remedies saline spray (aerosol/mist)- can try this, it is in the kids section - pseudoephedrine (Sudafed)- behind the counter, do not use if you have high blood pressure, medicine that have -D in them.  - phenylephrine (Sudafed PE) -Dextormethorphan + chlorpheniramine (Coridcidin HBP)- okay if you have high blood pressure -Oxymetazoline (Afrin) nasal spray- LIMIT to 3 days -Saline nasal spray -Neti pot (used distilled or bottled water)  Ear pain/congestion  -pseudoephedrine (sudafed) - Nasonex/flonase nasal spray  Fever  -Acetaminophen (Tyelnol) -Ibuprofen (Advil, motrin, aleve)  Sore Throat  -Acetaminophen (Tyelnol) -Ibuprofen (Advil, motrin, aleve) -Drink a lot of water -Gargle with salt water - Rest your voice (don't  talk) -Throat sprays -Cough drops  Body Aches  -Acetaminophen (Tyelnol) -Ibuprofen (Advil, motrin, aleve)  Headache  -Acetaminophen (Tyelnol) -Ibuprofen (Advil, motrin, aleve) - Exedrin, Exedrin Migraine  Allergy symptoms (cough, sneeze, runny nose, itchy eyes) -Claritin or loratadine cheapest but likely the weakest  -Zyrtec or certizine at night because it can make you sleepy -The strongest is allegra or fexafinadine  Cheapest at [employer], [employer], [employer]  Cough  -Dextromethorphan (Delsym)- medicine that has DM in it -Guafenesin (Mucinex/Robitussin) - cough drops - drink lots of water  Chest Congestion  -Guafenesin (Mucinex/Robitussin)  Red Itchy Eyes  - Naphcon-A  Upset Stomach  - Bland diet (nothing spicy, greasy, fried, and high acid foods like tomatoes, oranges, berries) -OKAY- cereal, bread, soup, crackers, rice -Eat smaller more frequent meals -reduce caffeine, no alcohol -Loperamide (Imodium-AD) if diarrhea -Prevacid for heart burn  General health when sick  -Hydration -wash your hands frequently -keep surfaces clean -change pillow cases and sheets often -Get fresh air but do not exercise strenuously -Vitamin D, double up on it - Vitamin C -Zinc    Food Choices to Lower Your Triglycerides Triglycerides are a type of fat in your blood. High levels of triglycerides can increase the risk of heart disease and stroke. If your triglyceride levels are high, the foods you eat and your eating habits are very important. Choosing the right foods can help lower your triglycerides. What general guidelines do I need to follow?  Lose weight if you are overweight.  Limit or avoid alcohol.  Fill one half of your plate with vegetables and green salads.  Limit fruit to two servings a day.  Choose fruit instead of juice.  Make one fourth of your plate whole grains. Look for the word "whole" as the first word in the ingredient list.  Fill one fourth of your plate with  lean protein foods.  Enjoy fatty fish (such as salmon, mackerel, sardines, and tuna) three times a week.  Choose healthy fats.  Limit foods high in starch and sugar.  Eat more home-cooked food and less restaurant, buffet, and fast food.  Limit fried foods.  Cook foods using methods other than frying.  Limit saturated fats.  Check ingredient lists to avoid foods with partially hydrogenated oils (trans fats) in them. What foods can I eat? Grains Whole grains, such as whole wheat or whole grain breads, crackers, cereals, and pasta. Unsweetened oatmeal, bulgur, barley, quinoa, or brown rice. Corn or whole wheat flour tortillas. Vegetables Fresh or frozen vegetables (raw, steamed, roasted, or grilled). Green salads. Fruits All fresh, canned (in natural juice), or frozen fruits. Meat and Other Protein Products Ground beef (85% or leaner), grass-fed beef, or beef trimmed of fat. Skinless chicken or [place]. Ground chicken or [place]. Pork trimmed of fat. All fish and seafood. Eggs. Dried beans, peas, or lentils. Unsalted nuts or seeds. Unsalted canned or dry beans. Dairy Low-fat dairy products, such as skim or 1% milk, 2% or reduced-fat cheeses, low-fat ricotta or cottage cheese, or plain low-fat yogurt. Fats and Oils Tub margarines without trans fats. Light or reduced-fat mayonnaise and salad dressings. Avocado. Safflower, olive, or canola oils. Natural peanut or almond butter. The items listed above may not be a complete list of recommended foods or beverages. Contact your dietitian for more options. What foods are not recommended? Grains White bread. White pasta. White rice. Cornbread. Bagels, pastries, and croissants. Crackers that contain trans fat. Vegetables White potatoes. Corn. Creamed or fried vegetables. Vegetables in a cheese sauce. Fruits Dried fruits. Canned fruit in light or heavy syrup. Fruit juice. Meat and Other Protein Products Fatty cuts of meat. Ribs, chicken wings,  bacon, sausage, bologna, salami, chitterlings, fatback, hot dogs, bratwurst, and packaged luncheon meats. Dairy Whole or 2% milk, cream, half-and-half, and cream cheese. Whole-fat or sweetened yogurt. Full-fat cheeses. Nondairy creamers and whipped toppings. Processed cheese, cheese spreads, or cheese curds. Sweets and Desserts Corn syrup, sugars, honey, and molasses. Candy. Jam and jelly. Syrup. Sweetened cereals. Cookies, pies, cakes, donuts, muffins, and ice cream. Fats and Oils Butter, stick margarine, lard, shortening, ghee, or bacon fat. Coconut, palm kernel, or palm oils. Beverages Alcohol. Sweetened drinks (such as sodas, lemonade, and fruit drinks or punches). The items listed above may not be a complete list of foods and beverages to avoid. Contact your dietitian for more information. This information is not intended to replace advice given to you by your health care provider. Make sure you discuss any questions you have with your health care provider. Document Released: 09/02/2004 Document Revised: 04/22/2016 Document Reviewed: 09/19/2013 Elsevier Interactive Patient Education  2017 [employer].

## 2018-01-05 DIAGNOSIS — R69 Illness, unspecified: Secondary | ICD-10-CM | POA: Diagnosis not present

## 2018-02-09 ENCOUNTER — Other Ambulatory Visit: Payer: Self-pay | Admitting: Adult Health

## 2018-02-14 ENCOUNTER — Encounter (INDEPENDENT_AMBULATORY_CARE_PROVIDER_SITE_OTHER): Payer: Self-pay

## 2018-03-30 DIAGNOSIS — G4733 Obstructive sleep apnea (adult) (pediatric): Secondary | ICD-10-CM | POA: Diagnosis not present

## 2018-04-05 DIAGNOSIS — D225 Melanocytic nevi of trunk: Secondary | ICD-10-CM | POA: Diagnosis not present

## 2018-04-05 DIAGNOSIS — L814 Other melanin hyperpigmentation: Secondary | ICD-10-CM | POA: Diagnosis not present

## 2018-04-05 DIAGNOSIS — D1800 Hemangioma unspecified site: Secondary | ICD-10-CM | POA: Diagnosis not present

## 2018-04-05 DIAGNOSIS — L570 Actinic keratosis: Secondary | ICD-10-CM | POA: Diagnosis not present

## 2018-04-05 DIAGNOSIS — L821 Other seborrheic keratosis: Secondary | ICD-10-CM | POA: Diagnosis not present

## 2018-04-06 ENCOUNTER — Other Ambulatory Visit: Payer: Self-pay | Admitting: Internal Medicine

## 2018-04-06 ENCOUNTER — Ambulatory Visit (INDEPENDENT_AMBULATORY_CARE_PROVIDER_SITE_OTHER): Payer: Medicare HMO | Admitting: Internal Medicine

## 2018-04-06 ENCOUNTER — Encounter: Payer: Self-pay | Admitting: Internal Medicine

## 2018-04-06 VITALS — BP 126/76 | HR 56 | Temp 97.2°F | Resp 18 | Ht 66.5 in | Wt 210.6 lb

## 2018-04-06 DIAGNOSIS — I10 Essential (primary) hypertension: Secondary | ICD-10-CM

## 2018-04-06 DIAGNOSIS — Z79899 Other long term (current) drug therapy: Secondary | ICD-10-CM | POA: Diagnosis not present

## 2018-04-06 DIAGNOSIS — M1000 Idiopathic gout, unspecified site: Secondary | ICD-10-CM

## 2018-04-06 DIAGNOSIS — E782 Mixed hyperlipidemia: Secondary | ICD-10-CM

## 2018-04-06 DIAGNOSIS — R7309 Other abnormal glucose: Secondary | ICD-10-CM

## 2018-04-06 DIAGNOSIS — E559 Vitamin D deficiency, unspecified: Secondary | ICD-10-CM

## 2018-04-06 MED ORDER — PREDNISONE 20 MG PO TABS: ORAL_TABLET | ORAL | 0 refills | Status: DC

## 2018-04-06 NOTE — Progress Notes (Signed)
This very nice 67 y.o. MWM presents for 6 month follow up with HTN, HLD, Pre-Diabetes and Vitamin D Deficiency.      Patient is treated for HTN & BP has been controlled at home. Today's BP is at goal - 126/76. Patient has had no complaints of any cardiac type chest pain, palpitations, dyspnea / orthopnea / PND, dizziness, claudication, or dependent edema.     Hyperlipidemia is controlled with diet & meds. Patient denies myalgias or other med SE's. Last Lipids were at goal albeit elevated Trig's: Lab Results  Component Value Date   CHOL 165 12/28/2017   HDL 33 (L) 12/28/2017   LDLCALC 93 12/28/2017   TRIG 274 (H) 12/28/2017   CHOLHDL 5.0 (H) 12/28/2017      Also, the patient has history of PreDiabetes and has had no symptoms of reactive hypoglycemia, diabetic polys, paresthesias or visual blurring.  Last A1c was at goal: Lab Results  Component Value Date   HGBA1C 5.1 09/19/2017      Further, the patient also has history of Vitamin D Deficiency and supplements vitamin D without any suspected side-effects. Last vitamin D was at goal:   Lab Results  Component Value Date   VD25OH 64 09/19/2017   Current Outpatient Medications on File Prior to Visit  Medication Sig  . allopurinol (ZYLOPRIM) 300 MG tablet TAKE 1 TABLET (300 MG TOTAL) BY MOUTH DAILY.  . bisoprolol-hydrochlorothiazide (ZIAC) 10-6.25 MG tablet TAKE 1 TABLET BY MOUTH EVERY DAY FOR BLOOD PRESSURE  . Cholecalciferol (VITAMIN D PO) Take 10,000 Units by mouth daily.   . clonazePAM (KLONOPIN) 1 MG tablet TAKE 1/2 TO 1 TABLET BY MOUTH AT BEDTIME  . KRILL OIL PO Take 350 mg by mouth 2 times daily at 12 noon and 4 pm.   . losartan-hydrochlorothiazide (HYZAAR) 100-25 MG tablet TAKE 1 TABLET BY MOUTH EVERY DAY  . Magnesium 400 MG CAPS Take by mouth.   . meloxicam (MOBIC) 15 MG tablet TAKE 1 TABLET BY MOUTH ONCE DAILY WITH FOOD FOR ARTHRITIS PAIN AND INFLAMMATION  . rosuvastatin (CRESTOR) 40 MG tablet TAKE 1 TABLET (40 MG TOTAL) BY  MOUTH DAILY.   No current facility-administered medications on file prior to visit.    Allergies  Allergen Reactions  . Lidocaine     blisters   PMHx:   Past Medical History:  Diagnosis Date  . Colon polyps   . DJD (degenerative joint disease)   . ED (erectile dysfunction)   . Gout   . Hyperlipemia   . Hypertension   . Obstructive sleep apnea    08/2012, cpap setting of 14  . Prostate cancer ([place]) 01/2002   s/p prostatectomy  . Vitamin D deficiency    Immunization History  Administered Date(s) Administered  . Influenza Split 08/26/2014  . Influenza, High Dose Seasonal PF 08/12/2016  . Influenza, Seasonal, Injecte, Preservative Fre 10/27/2015  . PPD Test 05/23/2014, 07/23/2015  . Pneumococcal Conjugate-13 08/26/2014  . Pneumococcal Polysaccharide-23 05/05/2010, 01/30/2016  . Td 11/30/2003  . Tdap 08/12/2010  . Zoster 04/29/2013   Past Surgical History:  Procedure Laterality Date  . COLONOSCOPY WITH PROPOFOL N/A 09/06/2016   Procedure: COLONOSCOPY WITH PROPOFOL;  Surgeon: Garlan Fair, MD;  Location: [facility];  Service: Endoscopy;  Laterality: N/A;  . colonscopy     x 3 with polyp one time  . PROSTATECTOMY  2003   FHx:    Reviewed / unchanged  SHx:    Reviewed / unchanged  Systems Review:  Constitutional: Denies fever, chills, wt changes, headaches, insomnia, fatigue, night sweats, change in appetite. Eyes: Denies redness, blurred vision, diplopia, discharge, itchy, watery eyes.  ENT: Denies discharge, congestion, post nasal drip, epistaxis, sore throat, earache, hearing loss, dental pain, tinnitus, vertigo, sinus pain, snoring.  CV: Denies chest pain, palpitations, irregular heartbeat, syncope, dyspnea, diaphoresis, orthopnea, PND, claudication or edema. Respiratory: denies cough, dyspnea, DOE, pleurisy, hoarseness, laryngitis, wheezing.  Gastrointestinal: Denies dysphagia, odynophagia, heartburn, reflux, water brash, abdominal pain or cramps, nausea,  vomiting, bloating, diarrhea, constipation, hematemesis, melena, hematochezia  or hemorrhoids. Genitourinary: Denies dysuria, frequency, urgency, nocturia, hesitancy, discharge, hematuria or flank pain. Musculoskeletal: Denies arthralgias, myalgias, stiffness, jt. swelling, pain, limping or strain/sprain.  Skin: Denies pruritus, rash, hives, warts, acne, eczema or change in skin lesion(s). Neuro: No weakness, tremor, incoordination, spasms, paresthesia or pain. Psychiatric: Denies confusion, memory loss or sensory loss. Endo: Denies change in weight, skin or hair change.  Heme/Lymph: No excessive bleeding, bruising or enlarged lymph nodes.  Physical Exam  BP 126/76   Pulse (!) 56   Temp (!) 97.2 F (36.2 C)   Resp 18   Ht 5' 6.5" (1.689 m)   Wt 210 lb 9.6 oz (95.5 kg)   BMI 33.48 kg/m   Appears  Over nourished, well groomed  and in no distress.  Eyes: PERRLA, EOMs, conjunctiva no swelling or erythema. Sinuses: No frontal/maxillary tenderness ENT/Mouth: EAC's clear, TM's nl w/o erythema, bulging. Nares clear w/o erythema, swelling, exudates. Oropharynx clear without erythema or exudates. Oral hygiene is good. Tongue normal, non obstructing. Hearing intact.  Neck: Supple. Thyroid not palpable. Car 2+/2+ without bruits, nodes or JVD. Chest: Respirations nl with BS clear & equal w/o rales, rhonchi, wheezing or stridor.  Cor: Heart sounds normal w/ regular rate and rhythm without sig. murmurs, gallops, clicks or rubs. Peripheral pulses normal and equal  without edema.  Abdomen: Soft & bowel sounds normal. Non-tender w/o guarding, rebound, hernias, masses or organomegaly.  Lymphatics: Unremarkable.  Musculoskeletal: Full ROM all peripheral extremities, joint stability, 5/5 strength and normal gait.  Skin: Warm, dry without exposed rashes, lesions or ecchymosis apparent.  Neuro: Cranial nerves intact, reflexes equal bilaterally. Sensory-motor testing grossly intact. Tendon reflexes grossly  intact.  Pysch: Alert & oriented x 3.  Insight and judgement nl & appropriate. No ideations.  Assessment and Plan:  1. Essential hypertension  - Continue medication, monitor blood pressure at home.  - Continue DASH diet.  Reminder to go to the ER if any CP,  SOB, nausea, dizziness, severe HA, changes vision/speech.  - CBC with Differential/Platelet - COMPLETE METABOLIC PANEL WITH GFR - Magnesium - TSH  2. Hyperlipidemia, mixed  - Continue diet/meds, exercise,& lifestyle modifications.  - Continue monitor periodic cholesterol/liver & renal functions   - Lipid panel - TSH  3. Abnormal glucose  - Continue diet, exercise, lifestyle modifications.  - Monitor appropriate labs.  - Hemoglobin A1c - Insulin, random  4. Vitamin D deficiency  - Continue supplementation.  - VITAMIN D 25 Hydroxyl  5. Idiopathic gout  - Uric acid  6. Medication management  - CBC with Differential/Platelet - COMPLETE METABOLIC PANEL WITH GFR - Magnesium - Lipid panel - TSH - Hemoglobin A1c - Insulin, random - VITAMIN D 25 Hydroxyl - Uric acid           Discussed  regular exercise, BP monitoring, weight control to achieve/maintain BMI less than 25 and discussed med and SE's. Recommended labs to assess and monitor clinical status  with further disposition pending results of labs. Over 30 minutes of exam, counseling, chart review was performed. \

## 2018-04-06 NOTE — Patient Instructions (Signed)

## 2018-04-07 LAB — HEMOGLOBIN A1C
Hgb A1c MFr Bld: 5.5 % of total Hgb (ref <5.7)
Mean Plasma Glucose: 111 (calc)
eAG (mmol/L): 6.2 (calc)

## 2018-04-07 LAB — CBC WITH DIFFERENTIAL/PLATELET
Basophils Absolute: 68 cells/uL (ref 0–200)
Basophils Relative: 0.7 %
Eosinophils Absolute: 126 cells/uL (ref 15–500)
Eosinophils Relative: 1.3 %
HCT: 48.5 % (ref 38.5–50.0)
Hemoglobin: 17.5 g/dL — ABNORMAL HIGH (ref 13.2–17.1)
Lymphs Abs: 2638 cells/uL (ref 850–3900)
MCH: 33.2 pg — ABNORMAL HIGH (ref 27.0–33.0)
MCHC: 36.1 g/dL — ABNORMAL HIGH (ref 32.0–36.0)
MCV: 92 fL (ref 80.0–100.0)
MPV: 11.1 fL (ref 7.5–12.5)
Monocytes Relative: 9.2 %
Neutro Abs: 5975 cells/uL (ref 1500–7800)
Neutrophils Relative %: 61.6 %
Platelets: 157 10*3/uL (ref 140–400)
RBC: 5.27 10*6/uL (ref 4.20–5.80)
RDW: 13.1 % (ref 11.0–15.0)
Total Lymphocyte: 27.2 %
WBC mixed population: 892 cells/uL (ref 200–950)
WBC: 9.7 10*3/uL (ref 3.8–10.8)

## 2018-04-07 LAB — COMPLETE METABOLIC PANEL WITH GFR
AG Ratio: 1.6 (calc) (ref 1.0–2.5)
ALT: 21 U/L (ref 9–46)
AST: 18 U/L (ref 10–35)
Albumin: 4.2 g/dL (ref 3.6–5.1)
Alkaline phosphatase (APISO): 65 U/L (ref 40–115)
BUN: 24 mg/dL (ref 7–25)
CO2: 31 mmol/L (ref 20–32)
Calcium: 9.9 mg/dL (ref 8.6–10.3)
Chloride: 104 mmol/L (ref 98–110)
Creat: 1.1 mg/dL (ref 0.70–1.25)
GFR, Est African American: 80 mL/min/{1.73_m2} (ref >=60)
GFR, Est Non African American: 69 mL/min/{1.73_m2} (ref >=60)
Globulin: 2.6 g/dL (calc) (ref 1.9–3.7)
Glucose, Bld: 102 mg/dL — ABNORMAL HIGH (ref 65–99)
Potassium: 4.5 mmol/L (ref 3.5–5.3)
Sodium: 142 mmol/L (ref 135–146)
Total Bilirubin: 0.5 mg/dL (ref 0.2–1.2)
Total Protein: 6.8 g/dL (ref 6.1–8.1)

## 2018-04-07 LAB — VITAMIN D 25 HYDROXY (VIT D DEFICIENCY, FRACTURES): Vit D, 25-Hydroxy: 76 ng/mL (ref 30–100)

## 2018-04-07 LAB — TSH: TSH: 0.72 mIU/L (ref 0.40–4.50)

## 2018-04-07 LAB — LIPID PANEL
Cholesterol: 136 mg/dL (ref <200)
HDL: 41 mg/dL (ref >40)
LDL Cholesterol (Calc): 67 mg/dL (calc)
Non-HDL Cholesterol (Calc): 95 mg/dL (calc) (ref <130)
Total CHOL/HDL Ratio: 3.3 (calc) (ref <5.0)
Triglycerides: 216 mg/dL — ABNORMAL HIGH (ref <150)

## 2018-04-07 LAB — URIC ACID: Uric Acid, Serum: 5.8 mg/dL (ref 4.0–8.0)

## 2018-04-07 LAB — INSULIN, RANDOM: Insulin: 69.2 u[IU]/mL — ABNORMAL HIGH (ref 2.0–19.6)

## 2018-04-07 LAB — MAGNESIUM: Magnesium: 1.8 mg/dL (ref 1.5–2.5)

## 2018-04-08 ENCOUNTER — Encounter: Payer: Self-pay | Admitting: Internal Medicine

## 2018-05-02 ENCOUNTER — Other Ambulatory Visit: Payer: Self-pay | Admitting: Internal Medicine

## 2018-06-16 ENCOUNTER — Other Ambulatory Visit: Payer: Self-pay | Admitting: Physician Assistant

## 2018-07-13 ENCOUNTER — Ambulatory Visit: Payer: Self-pay | Admitting: Adult Health

## 2018-07-13 ENCOUNTER — Encounter: Payer: Self-pay | Admitting: Physician Assistant

## 2018-07-13 ENCOUNTER — Ambulatory Visit (INDEPENDENT_AMBULATORY_CARE_PROVIDER_SITE_OTHER): Payer: Medicare HMO | Admitting: Physician Assistant

## 2018-07-13 VITALS — BP 126/70 | HR 84 | Temp 97.4°F | Resp 18 | Ht 66.5 in | Wt 209.0 lb

## 2018-07-13 DIAGNOSIS — E669 Obesity, unspecified: Secondary | ICD-10-CM | POA: Diagnosis not present

## 2018-07-13 DIAGNOSIS — M1990 Unspecified osteoarthritis, unspecified site: Secondary | ICD-10-CM

## 2018-07-13 DIAGNOSIS — G4733 Obstructive sleep apnea (adult) (pediatric): Secondary | ICD-10-CM

## 2018-07-13 DIAGNOSIS — R6889 Other general symptoms and signs: Secondary | ICD-10-CM

## 2018-07-13 DIAGNOSIS — Z0001 Encounter for general adult medical examination with abnormal findings: Secondary | ICD-10-CM

## 2018-07-13 DIAGNOSIS — Z79899 Other long term (current) drug therapy: Secondary | ICD-10-CM

## 2018-07-13 DIAGNOSIS — J449 Chronic obstructive pulmonary disease, unspecified: Secondary | ICD-10-CM

## 2018-07-13 DIAGNOSIS — R7309 Other abnormal glucose: Secondary | ICD-10-CM

## 2018-07-13 DIAGNOSIS — N529 Male erectile dysfunction, unspecified: Secondary | ICD-10-CM

## 2018-07-13 DIAGNOSIS — E559 Vitamin D deficiency, unspecified: Secondary | ICD-10-CM

## 2018-07-13 DIAGNOSIS — I10 Essential (primary) hypertension: Secondary | ICD-10-CM | POA: Diagnosis not present

## 2018-07-13 DIAGNOSIS — E785 Hyperlipidemia, unspecified: Secondary | ICD-10-CM

## 2018-07-13 DIAGNOSIS — M1000 Idiopathic gout, unspecified site: Secondary | ICD-10-CM | POA: Diagnosis not present

## 2018-07-13 DIAGNOSIS — Z6833 Body mass index (BMI) 33.0-33.9, adult: Secondary | ICD-10-CM

## 2018-07-13 DIAGNOSIS — Z0000 Encounter for general adult medical examination without abnormal findings: Secondary | ICD-10-CM

## 2018-07-13 DIAGNOSIS — F17200 Nicotine dependence, unspecified, uncomplicated: Secondary | ICD-10-CM

## 2018-07-13 DIAGNOSIS — I700 Atherosclerosis of aorta: Secondary | ICD-10-CM | POA: Diagnosis not present

## 2018-07-13 MED ORDER — VARENICLINE TARTRATE 1 MG PO TABS: 1.0000 mg | ORAL_TABLET | Freq: Two times a day (BID) | ORAL | 1 refills | Status: DC

## 2018-07-13 NOTE — Progress Notes (Signed)
medicare wellness visit  Assessment:   Essential hypertension - continue medications, DASH diet, exercise and monitor at home. Call if greater than 130/80.  -     CBC with Differential/Platelet -     BASIC METABOLIC PANEL WITH GFR -     Hepatic function panel -     TSH  OSA and COPD overlap syndrome Continue CPAP, stop smoking, chantix sent in  Morbid Obesity with co morbidities - long discussion about weight loss, diet, and exercise  Tobacco use disorder - over 50 pack year smoking history, current smoker, no symptoms.  - going to try to quit on his own, will think about trying chantix again- sent in - willing to get low dose CT next year  Mixed hyperlipidemia -continue medications, check lipids, decrease fatty foods, increase activity.  -     Lipid panel  Prediabetes Discussed general issues about diabetes pathophysiology and management., Educational material distributed., Suggested low cholesterol diet., Encouraged aerobic exercise., Discussed foot care., Reminded to get yearly retinal exam. -     Hemoglobin A1c  Vitamin D deficiency Continue supplement  Idiopathic gout, unspecified chronicity, unspecified site Gout- recheck Uric acid as needed, Diet discussed, continue medications.  Medication management -     Magnesium  Erectile dysfunction, unspecified erectile dysfunction type Weight loss advised  Osteoarthritis, unspecified osteoarthritis type, unspecified site Weight loss advised  Encounter for Medicare annual wellness exam Due next year  Chronic obstructive pulmonary disease, unspecified COPD type ([place]) No symptoms, no medications for it, advised to quit smoking  Aortic atherosclerosis ([place]) Control blood pressure, cholesterol, glucose, increase exercise.    Over 30 minutes of exam, counseling, chart review, and critical decision making was performed Future Appointments  Date Time Provider [place]  10/17/2018  2:00 PM [name], MD  [facility] None    Plan:   During the course of the visit the patient was educated and counseled about appropriate screening and preventive services including:    Pneumococcal vaccine   Influenza vaccine  Prevnar 13  Td vaccine  Screening electrocardiogram  Colorectal cancer screening  Diabetes screening  Glaucoma screening  Nutrition counseling    Subjective:  Stephen Holland is a 67 y.o. male who presents for Medicare Annual Wellness Visit and 3 month follow up for HTN, hyperlipidemia, prediabetes, and vitamin D Def.   His blood pressure has been controlled at home, today their BP is BP: 126/70  He does workout. He denies chest pain, shortness of breath, dizziness.  He does smoke, was on chantix x 1 month but states it did not help. Had low dose CT 06/2017. He has COPD, not on meds at this time. He has OSA.  Will work 2 days a week part time [employer] for sleep but will take every other day.   He is on cholesterol medication and denies myalgias. His cholesterol is at goal. The cholesterol last visit was:   Lab Results  Component Value Date   CHOL 136 04/06/2018   HDL 41 04/06/2018   LDLCALC 67 04/06/2018   TRIG 216 (H) 04/06/2018   CHOLHDL 3.3 04/06/2018    He has been working on diet and exercise for prediabetes, and denies paresthesia of the feet, polydipsia, polyuria and visual disturbances. Last A1C in the office was:  Lab Results  Component Value Date   HGBA1C 5.5 04/06/2018   Lab Results  Component Value Date   GFRNONAA 69 04/06/2018   Patient is on Vitamin D supplement.  Lab Results  Component Value Date   VD25OH 76 04/06/2018     Patient is on allopurinol for gout and does not report a recent flare.  Lab Results  Component Value Date   LABURIC 5.8 04/06/2018   BMI is Body mass index is 33.23 kg/m., he is working on diet and exercise. Wt Readings from Last 3 Encounters:  07/13/18 209 lb (94.8 kg)  04/06/18 210 lb 9.6 oz (95.5 kg)   12/28/17 210 lb (95.3 kg)    Medication Review: Current Outpatient Medications on File Prior to Visit  Medication Sig Dispense Refill  . allopurinol (ZYLOPRIM) 300 MG tablet TAKE 1 TABLET (300 MG TOTAL) BY MOUTH DAILY. 90 tablet 1  . bisoprolol-hydrochlorothiazide (ZIAC) 10-6.25 MG tablet TAKE 1 TABLET BY MOUTH EVERY DAY FOR BLOOD PRESSURE 90 tablet 1  . Cholecalciferol (VITAMIN D PO) Take 10,000 Units by mouth daily.     . clonazePAM (KLONOPIN) 1 MG tablet TAKE 1/2 TO 1 TABLET BY MOUTH AT BEDTIME 90 tablet 0  . KRILL OIL PO Take 350 mg by mouth 2 times daily at 12 noon and 4 pm.     . losartan-hydrochlorothiazide (HYZAAR) 100-25 MG tablet TAKE 1 TABLET BY MOUTH EVERY DAY 90 tablet 0  . Magnesium 400 MG CAPS Take by mouth.     . meloxicam (MOBIC) 15 MG tablet TAKE 1 TABLET BY MOUTH ONCE DAILY WITH FOOD FOR ARTHRITIS PAIN AND INFLAMMATION 90 tablet 1  . rosuvastatin (CRESTOR) 40 MG tablet TAKE 1 TABLET (40 MG TOTAL) BY MOUTH DAILY. 90 tablet 1   No current facility-administered medications on file prior to visit.     Current Problems (verified) Patient Active Problem List   Diagnosis Date Noted  . Aortic atherosclerosis ([place]) 06/30/2017  . COPD (chronic obstructive pulmonary disease) ([place]) 06/13/2017  . Medication management 12/03/2014  . Obesity (BMI 30.0-34.9) 08/26/2014  . Tobacco use disorder 08/26/2014  . Hypertension   . Hyperlipemia   . Other abnormal glucose   . OSA and COPD overlap syndrome ([place])   . Vitamin D deficiency   . DJD (degenerative joint disease)   . Primary gout   . ED (erectile dysfunction)     Screening Tests Immunization History  Administered Date(s) Administered  . Influenza Split 08/26/2014  . Influenza, High Dose Seasonal PF 08/12/2016  . Influenza, Seasonal, Injecte, Preservative Fre 10/27/2015  . PPD Test 05/23/2014, 07/23/2015  . Pneumococcal Conjugate-13 08/26/2014  . Pneumococcal Polysaccharide-23 05/05/2010, 01/30/2016  . Td 11/30/2003   . Tdap 08/12/2010  . Zoster 04/29/2013   Preventative care: Last colonoscopy: 08/2016 CXR 2015 + COPD CT chest 06/2017 IMPRESSION: Lung-RADS 1, negative. Continue annual screening with low-dose chest CT without contrast in 12 months. Aortic Atherosclerosis (ICD10-I70.0) and Emphysema (ICD10-J43.9).  Prior vaccinations: TD or Tdap: 2011   Influenza: 2018 Pneumococcal: 2011 Prevnar13: 2015 Shingles/Zostavax: 2014  Names of Other Physician/Practitioners you currently use: 1. [facility] here for primary care 2. [facility], Therapist, art, last visit 2016 3. Dr. Freda Munro and Kalman Shan, dentist, last visit 2018 q 6 months Patient Care Team: [name], MD as PCP - General (Internal Medicine)  Allergies Allergies  Allergen Reactions  . Lidocaine     blisters    SURGICAL HISTORY He  has a past surgical history that includes Prostatectomy (2003); colonscopy; and Colonoscopy with propofol (N/A, 09/06/2016). FAMILY HISTORY His family history includes Hypertension in his mother. SOCIAL HISTORY He  reports that he has been smoking cigarettes. He  has a 45.00 pack-year smoking history. He has never used smokeless tobacco. He reports that he drinks about 15.0 standard drinks of alcohol per week. He reports that he does not use drugs.  MEDICARE WELLNESS OBJECTIVES: Physical activity: Current Exercise Habits: The patient does not participate in regular exercise at present(works around home and yard) Cardiac risk factors: Cardiac Risk Factors include: advanced age (>33men, >76 women);dyslipidemia;hypertension;obesity (BMI >30kg/m2);male gender;sedentary lifestyle;smoking/ tobacco exposure Depression/mood screen:   Depression screen [facility] 2/9 07/13/2018  Decreased Interest 0  Down, Depressed, Hopeless 0  PHQ - 2 Score 0    ADLs:  In your present state of health, do you have any difficulty performing the following activities: 07/13/2018 04/08/2018   Hearing? Y N  Vision? N N  Difficulty concentrating or making decisions? N N  Walking or climbing stairs? N N  Dressing or bathing? N N  Doing errands, shopping? N N  Some recent data might be hidden     Cognitive Testing  Alert? Yes  Normal Appearance?Yes  Oriented to person? Yes  Place? Yes   Time? Yes  Recall of three objects?  Yes  Can perform simple calculations? Yes  Displays appropriate judgment?Yes  Can read the correct time from a watch face?Yes  EOL planning: Does Patient Have a Medical Advance Directive?: No Would patient like information on creating a medical advance directive?: Yes (MAU/Ambulatory/Procedural Areas - Information given)   Objective:   Today's Vitals   07/13/18 1503  BP: 126/70  Pulse: 84  Resp: 18  Temp: (!) 97.4 F (36.3 C)  SpO2: 95%  Weight: 209 lb (94.8 kg)  Height: 5' 6.5" (1.689 m)  PainSc: 3    Body mass index is 33.23 kg/m.  General appearance: alert, no distress, WD/WN, male HEENT: normocephalic, sclerae anicteric, TMs pearly, nares patent, no discharge or erythema, pharynx normal Oral cavity: MMM, no lesions Neck: supple, no lymphadenopathy, no thyromegaly, no masses Heart: RRR, normal S1, S2, no murmurs Lungs: CTA bilaterally, no wheezes, rhonchi, or rales Abdomen: +bs, soft, obese non tender, non distended, no masses, no hepatomegaly, no splenomegaly Musculoskeletal: nontender, no swelling, no obvious deformity Extremities: no edema, no cyanosis, no clubbing Pulses: 2+ symmetric, upper and lower extremities, normal cap refill Neurological: alert, oriented x 3, CN2-12 intact, strength normal upper extremities and lower extremities, sensation normal throughout, DTRs 2+ throughout, no cerebellar signs, gait normal Psychiatric: normal affect, behavior normal, pleasant   Medicare Attestation I have personally reviewed: The patient's medical and social history Their use of alcohol, tobacco or illicit drugs Their current  medications and supplements The patient's functional ability including ADLs,fall risks, home safety risks, cognitive, and hearing and visual impairment Diet and physical activities Evidence for depression or mood disorders  The patient's weight, height, BMI, and visual acuity have been recorded in the chart.  I have made referrals, counseling, and provided education to the patient based on review of the above and I have provided the patient with a written personalized care plan for preventive services.     Vicie Mutters, PA-C   07/13/2018

## 2018-07-13 NOTE — Patient Instructions (Signed)
[employer]  [phone] for more information or for a free program for smoking cessation help.   You can call QUIT SMART 1-800-QUIT-NOW for free nicotine patches or replacement therapy- if they are out- keep calling   cancer center Can call for smoking cessation classes, [phone]  If you have a smart phone, please look up Smoke Free app, this will help you stay on track and give you information about money you have saved, life that you have gained back and a ton of more information.   We are giving you chantix for smoking cessation. You can do it! And we are here to help! You may have heard some scary side effects about chantix, the three most common I hear about are nausea, crazy dreams and depression.  However, I like for my patients to try to stay on 1/2 a tablet twice a day rather than one tablet twice a day as normally prescribed. This helps decrease the chances of side effects and helps save money by making a one month prescription last two months  Please start the prescription this way:  Start 1/2 tablet by mouth once daily after food with a full glass of water for 3 days Then do 1/2 tablet by mouth twice daily for 4 days. During this first week you can smoke, but try to stop after this week.  At this point we have several options: 1) continue on 1/2 tablet twice a day- which I encourage you to do. You can stay on this dose the rest of the time on the medication or if you still feel the need to smoke you can do one of the two options below. 2) do one tablet in the morning and 1/2 in the evening which helps decrease dreams. 3) do one tablet twice a day.   What if I miss a dose? If you miss a dose, take it as soon as you can. If it is almost time for your next dose, take only that dose. Do not take double or extra doses.  What should I watch for while using this medicine? Visit your doctor or health care professional for regular check ups. Ask for ongoing  advice and encouragement from your doctor or healthcare professional, friends, and family to help you quit. If you smoke while on this medication, quit again  Your mouth may get dry. Chewing sugarless gum or hard candy, and drinking plenty of water may help. Contact your doctor if the problem does not go away or is severe.  You may get drowsy or dizzy. Do not drive, use machinery, or do anything that needs mental alertness until you know how this medicine affects you. Do not stand or sit up quickly, especially if you are an older patient.   The use of this medicine may increase the chance of suicidal thoughts or actions. Pay special attention to how you are responding while on this medicine. Any worsening of mood, or thoughts of suicide or dying should be reported to your health care professional right away.  ADVANTAGES OF QUITTING SMOKING  Within 20 minutes, blood pressure decreases. Your pulse is at normal level.  After 8 hours, carbon monoxide levels in the blood return to normal. Your oxygen level increases.  After 24 hours, the chance of having a heart attack starts to decrease. Your breath, hair, and body stop smelling like smoke.  After 48 hours, damaged nerve endings begin to recover. Your sense of taste and smell improve.  After 72  hours, the body is virtually free of nicotine. Your bronchial tubes relax and breathing becomes easier.  After 2 to 12 weeks, lungs can hold more air. Exercise becomes easier and circulation improves.  After 1 year, the risk of coronary heart disease is cut in half.  After 5 years, the risk of stroke falls to the same as a nonsmoker.  After 10 years, the risk of lung cancer is cut in half and the risk of other cancers decreases significantly.  After 15 years, the risk of coronary heart disease drops, usually to the level of a nonsmoker.  You will have extra money to spend on things other than cigarettes.

## 2018-07-14 LAB — COMPLETE METABOLIC PANEL WITH GFR
AG Ratio: 1.3 (calc) (ref 1.0–2.5)
ALT: 23 U/L (ref 9–46)
AST: 21 U/L (ref 10–35)
Albumin: 4.1 g/dL (ref 3.6–5.1)
Alkaline phosphatase (APISO): 58 U/L (ref 40–115)
BUN/Creatinine Ratio: 19 (calc) (ref 6–22)
BUN: 25 mg/dL (ref 7–25)
CO2: 28 mmol/L (ref 20–32)
Calcium: 9.7 mg/dL (ref 8.6–10.3)
Chloride: 102 mmol/L (ref 98–110)
Creat: 1.3 mg/dL — ABNORMAL HIGH (ref 0.70–1.25)
GFR, Est African American: 65 mL/min/{1.73_m2} (ref >=60)
GFR, Est Non African American: 56 mL/min/{1.73_m2} — ABNORMAL LOW (ref >=60)
Globulin: 3.1 g/dL (calc) (ref 1.9–3.7)
Glucose, Bld: 82 mg/dL (ref 65–99)
Potassium: 4.1 mmol/L (ref 3.5–5.3)
Sodium: 139 mmol/L (ref 135–146)
Total Bilirubin: 0.7 mg/dL (ref 0.2–1.2)
Total Protein: 7.2 g/dL (ref 6.1–8.1)

## 2018-07-14 LAB — LIPID PANEL
Cholesterol: 145 mg/dL (ref <200)
HDL: 38 mg/dL — ABNORMAL LOW (ref >40)
LDL Cholesterol (Calc): 63 mg/dL (calc)
Non-HDL Cholesterol (Calc): 107 mg/dL (calc) (ref <130)
Total CHOL/HDL Ratio: 3.8 (calc) (ref <5.0)
Triglycerides: 366 mg/dL — ABNORMAL HIGH (ref <150)

## 2018-07-14 LAB — CBC WITH DIFFERENTIAL/PLATELET
Basophils Absolute: 59 cells/uL (ref 0–200)
Basophils Relative: 0.6 %
Eosinophils Absolute: 98 cells/uL (ref 15–500)
Eosinophils Relative: 1 %
HCT: 49.5 % (ref 38.5–50.0)
Hemoglobin: 17.6 g/dL — ABNORMAL HIGH (ref 13.2–17.1)
Lymphs Abs: 2695 cells/uL (ref 850–3900)
MCH: 32.8 pg (ref 27.0–33.0)
MCHC: 35.6 g/dL (ref 32.0–36.0)
MCV: 92.2 fL (ref 80.0–100.0)
MPV: 11.5 fL (ref 7.5–12.5)
Monocytes Relative: 8.4 %
Neutro Abs: 6125 cells/uL (ref 1500–7800)
Neutrophils Relative %: 62.5 %
Platelets: 161 10*3/uL (ref 140–400)
RBC: 5.37 10*6/uL (ref 4.20–5.80)
RDW: 12.8 % (ref 11.0–15.0)
Total Lymphocyte: 27.5 %
WBC mixed population: 823 cells/uL (ref 200–950)
WBC: 9.8 10*3/uL (ref 3.8–10.8)

## 2018-07-14 LAB — TSH: TSH: 0.78 mIU/L (ref 0.40–4.50)

## 2018-07-15 ENCOUNTER — Other Ambulatory Visit: Payer: Self-pay | Admitting: Internal Medicine

## 2018-07-17 DIAGNOSIS — G4733 Obstructive sleep apnea (adult) (pediatric): Secondary | ICD-10-CM | POA: Diagnosis not present

## 2018-07-20 ENCOUNTER — Other Ambulatory Visit: Payer: Self-pay | Admitting: Physician Assistant

## 2018-07-20 MED ORDER — VARENICLINE TARTRATE 1 MG PO TABS: 1.0000 mg | ORAL_TABLET | Freq: Two times a day (BID) | ORAL | 1 refills | Status: DC

## 2018-07-20 NOTE — Addendum Note (Signed)
Addended by: Elsie Amis D on: 07/20/2018 02:16 PM   Modules accepted: Orders

## 2018-07-30 ENCOUNTER — Other Ambulatory Visit: Payer: Self-pay | Admitting: Adult Health

## 2018-09-14 ENCOUNTER — Other Ambulatory Visit: Payer: Self-pay | Admitting: Internal Medicine

## 2018-10-09 ENCOUNTER — Other Ambulatory Visit: Payer: Self-pay | Admitting: Internal Medicine

## 2018-10-17 ENCOUNTER — Encounter: Payer: Self-pay | Admitting: Internal Medicine

## 2018-10-17 ENCOUNTER — Ambulatory Visit (INDEPENDENT_AMBULATORY_CARE_PROVIDER_SITE_OTHER): Payer: Medicare HMO | Admitting: Internal Medicine

## 2018-10-17 VITALS — BP 136/74 | HR 60 | Temp 97.1°F | Resp 16 | Ht 66.75 in | Wt 211.4 lb

## 2018-10-17 DIAGNOSIS — Z136 Encounter for screening for cardiovascular disorders: Secondary | ICD-10-CM | POA: Diagnosis not present

## 2018-10-17 DIAGNOSIS — Z125 Encounter for screening for malignant neoplasm of prostate: Secondary | ICD-10-CM | POA: Diagnosis not present

## 2018-10-17 DIAGNOSIS — I10 Essential (primary) hypertension: Secondary | ICD-10-CM | POA: Diagnosis not present

## 2018-10-17 DIAGNOSIS — M1000 Idiopathic gout, unspecified site: Secondary | ICD-10-CM | POA: Diagnosis not present

## 2018-10-17 DIAGNOSIS — I700 Atherosclerosis of aorta: Secondary | ICD-10-CM

## 2018-10-17 DIAGNOSIS — R7309 Other abnormal glucose: Secondary | ICD-10-CM | POA: Diagnosis not present

## 2018-10-17 DIAGNOSIS — Z0001 Encounter for general adult medical examination with abnormal findings: Secondary | ICD-10-CM

## 2018-10-17 DIAGNOSIS — Z122 Encounter for screening for malignant neoplasm of respiratory organs: Secondary | ICD-10-CM

## 2018-10-17 DIAGNOSIS — F17200 Nicotine dependence, unspecified, uncomplicated: Secondary | ICD-10-CM

## 2018-10-17 DIAGNOSIS — E782 Mixed hyperlipidemia: Secondary | ICD-10-CM | POA: Diagnosis not present

## 2018-10-17 DIAGNOSIS — J449 Chronic obstructive pulmonary disease, unspecified: Secondary | ICD-10-CM

## 2018-10-17 DIAGNOSIS — Z1212 Encounter for screening for malignant neoplasm of rectum: Secondary | ICD-10-CM

## 2018-10-17 DIAGNOSIS — G609 Hereditary and idiopathic neuropathy, unspecified: Secondary | ICD-10-CM

## 2018-10-17 DIAGNOSIS — N401 Enlarged prostate with lower urinary tract symptoms: Secondary | ICD-10-CM | POA: Diagnosis not present

## 2018-10-17 DIAGNOSIS — Z0000 Encounter for general adult medical examination without abnormal findings: Secondary | ICD-10-CM

## 2018-10-17 DIAGNOSIS — Z23 Encounter for immunization: Secondary | ICD-10-CM | POA: Diagnosis not present

## 2018-10-17 DIAGNOSIS — R7303 Prediabetes: Secondary | ICD-10-CM | POA: Diagnosis not present

## 2018-10-17 DIAGNOSIS — E559 Vitamin D deficiency, unspecified: Secondary | ICD-10-CM

## 2018-10-17 DIAGNOSIS — N138 Other obstructive and reflux uropathy: Secondary | ICD-10-CM

## 2018-10-17 DIAGNOSIS — Z79899 Other long term (current) drug therapy: Secondary | ICD-10-CM

## 2018-10-17 DIAGNOSIS — Z8249 Family history of ischemic heart disease and other diseases of the circulatory system: Secondary | ICD-10-CM

## 2018-10-17 DIAGNOSIS — G4733 Obstructive sleep apnea (adult) (pediatric): Secondary | ICD-10-CM

## 2018-10-17 DIAGNOSIS — Z1211 Encounter for screening for malignant neoplasm of colon: Secondary | ICD-10-CM

## 2018-10-17 NOTE — Progress Notes (Signed)
[facility]   [name], M.D.     [name], P.A.-C Liane Comber, [place]                [address] [place], [place] [address] Telephone [phone] Telefax [phone] Annual  Screening/Preventative Visit  & Comprehensive Evaluation & Examination     This very nice 67 y.o. MWM  presents for a Screening /Preventative Visit & comprehensive evaluation and management of multiple medical co-morbidities.  Patient has been followed for HTN, HLD, Prediabetes and Vitamin D Deficiency. Patient's Gout is quiescent on Allopurinol. Patient also has OSA on CPAP with improved restorative sleep and less daytime excessive sleepiness.     Patient has had EDSI by Dr Mina Marble for LBP and also has had Rt carpal tunnel injections by Dr Mina Marble. Patient has c/o numbness of his toes and fingers which frequently are also painful and in his hands interfere with his manual dexterity. Patient desires further evaluation of his neuropathy.      Due to his long history of smoking over 35 pk years,  I discussed lung cancer screening with him. He was agreeable to undergo a screening low dose CT scan of the chest (last Aug 2018 - LDscrCTscan was Negative). We discussed smoking cessation techniques/options. I will refer him  for a LDCT lung scan & discussed lung cancer  Sessions options.      HTN predates since the 1980's. Patient's BP has been controlled at home.  Today's BP is at goal- 136/74. Patient denies any cardiac symptoms as chest pain, palpitations, shortness of breath, dizziness or ankle swelling.     Patient's hyperlipidemia is controlled with diet and medications. Patient denies myalgias or other medication SE's. Last lipids were  Lab Results  Component Value Date   CHOL 145 07/13/2018   HDL 38 (L) 07/13/2018   LDLCALC 63 07/13/2018   TRIG 366 (H) 07/13/2018   CHOLHDL 3.8 07/13/2018      Patient has hx/o  Morbid Obesity (BMI 33+) and prediabetes  (A1c 5.7% / 2011)  and patient denies reactive hypoglycemic symptoms, visual blurring, diabetic polys or paresthesias. Last A1c was Normal & at goal: Lab Results  Component Value Date   HGBA1C 5.5 04/06/2018       Finally, patient has history of Vitamin D Deficiency ("12" / 2009)  and last vitamin D was at goal: Lab Results  Component Value Date   VD25OH 76 04/06/2018   Current Outpatient Medications on File Prior to Visit  Medication Sig  . allopurinol (ZYLOPRIM) 300 MG tablet TAKE 1 TABLET (300 MG TOTAL) BY MOUTH DAILY.  . bisoprolol-hydrochlorothiazide (ZIAC) 10-6.25 MG tablet TAKE 1 TABLET BY MOUTH EVERY DAY FOR BLOOD PRESSURE  . Cholecalciferol (VITAMIN D PO) Take 10,000 Units by mouth daily.   . clonazePAM (KLONOPIN) 1 MG tablet TAKE 1/2 TO 1 TABLET BY MOUTH AT BEDTIME AS NEEDED  . KRILL OIL PO Take 350 mg by mouth 2 times daily at 12 noon and 4 pm.   . Magnesium 400 MG CAPS Take by mouth.   . meloxicam (MOBIC) 15 MG tablet TAKE 1 TABLET BY MOUTH ONCE DAILY WITH FOOD FOR ARTHRITIS PAIN AND INFLAMMATION  . rosuvastatin (CRESTOR) 40 MG tablet TAKE 1 TABLET (40 MG TOTAL) BY MOUTH DAILY.  Marland Kitchen telmisartan-hydrochlorothiazide (MICARDIS HCT) 80-25 MG tablet Please specify directions, refills and quantity  No current facility-administered medications on file prior to visit.    Allergies  Allergen Reactions  . Lidocaine     blisters   Past Medical History:  Diagnosis Date  . Colon polyps   . DJD (degenerative joint disease)   . ED (erectile dysfunction)   . Gout   . Hyperlipemia   . Hypertension   . Obstructive sleep apnea    08/2012, cpap setting of 14  . Prostate cancer ([place]) 01/2002   s/p prostatectomy  . Vitamin D deficiency    Health Maintenance  Topic Date Due  . INFLUENZA VACCINE  06/29/2018  . Fecal DNA (Cologuard)  08/10/2018  . TETANUS/TDAP  08/12/2020  . Hepatitis C Screening  Completed  . PNA vac Low Risk Adult   Completed   Immunization History  Administered Date(s) Administered  . Influenza Split 08/26/2014  . Influenza, High Dose Seasonal PF 08/12/2016  . Influenza, Seasonal, Injecte, Preservative Fre 10/27/2015  . PPD Test 05/23/2014, 07/23/2015  . Pneumococcal Conjugate-13 08/26/2014  . Pneumococcal Polysaccharide-23 05/05/2010, 01/30/2016  . Td 11/30/2003  . Tdap 08/12/2010  . Zoster 04/29/2013   Last Colon - 09/06/2016 - Dr Mellody Memos - recc 10 yr f/u  - due Oct 2027.   Past Surgical History:  Procedure Laterality Date  . COLONOSCOPY WITH PROPOFOL N/A 09/06/2016   Procedure: COLONOSCOPY WITH PROPOFOL;  Surgeon: Garlan Fair, MD;  Location: [facility];  Service: Endoscopy;  Laterality: N/A;  . colonscopy     x 3 with polyp one time  . PROSTATECTOMY  2003   Family History  Problem Relation Age of Onset  . Hypertension Mother    Social History   Socioeconomic History  . Marital status: Married    Spouse name: Not on file  . Number of children: Not on file  Occupational History  . 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 Smoking status: Current Every Day Smoker    Packs/day: 1.00    Years: 45.00    Pack years: 45.00    Types: Cigarettes  . Smokeless tobacco: Never Used  Substance and Sexual Activity  . Alcohol use: Yes    Alcohol/week: 15.0 standard drinks    Types: 15 Standard drinks or equivalent per week    Comment: 2-3 mixed drinks per day  . Drug use: No  . Sexual activity: Not on file    ROS Constitutional: Denies fever, chills, weight loss/gain, headaches, insomnia,  night sweats or change in appetite. Does c/o fatigue. Eyes: Denies redness, blurred vision, diplopia, discharge, itchy or watery eyes.  ENT: Denies discharge, congestion, post nasal drip, epistaxis, sore throat, earache, hearing loss, dental pain, Tinnitus, Vertigo, Sinus pain or snoring.  Cardio: Denies chest pain, palpitations, irregular  heartbeat, syncope, dyspnea, diaphoresis, orthopnea, PND, claudication or edema Respiratory: denies cough, dyspnea, DOE, pleurisy, hoarseness, laryngitis or wheezing.  Gastrointestinal: Denies dysphagia, heartburn, reflux, water brash, pain, cramps, nausea, vomiting, bloating, diarrhea, constipation, hematemesis, melena, hematochezia, jaundice or hemorrhoids Genitourinary: Denies dysuria, frequency, urgency, nocturia, hesitancy, discharge, hematuria or flank pain Musculoskeletal: Denies arthralgia, myalgia, stiffness, Jt. Swelling, pain, limp or strain/sprain. Denies Falls. Skin: Denies puritis, rash, hives, warts, acne, eczema or change in skin lesion Neuro: No weakness, tremor, incoordination, spasms, but has paresthesias & pain of fingers & toes. Psychiatric: Denies confusion, memory loss or sensory loss. Denies Depression. Endocrine: Denies change in weight, skin, hair change, nocturia, and paresthesia, diabetic polys, visual  blurring or hyper / hypo glycemic episodes.  Heme/Lymph: No excessive bleeding, bruising or enlarged lymph nodes.  Physical Exam  BP 136/74   Pulse 60   Temp (!) 97.1 F (36.2 C)   Resp 16   Ht 5' 6.75" (1.695 m)   Wt 211 lb 6.4 oz (95.9 kg)   BMI 33.36 kg/m   General Appearance: Over nourished and well groomed and in no apparent distress.  Eyes: PERRLA, EOMs, conjunctiva no swelling or erythema, normal fundi and vessels. Sinuses: No frontal/maxillary tenderness ENT/Mouth: EACs patent / TMs  nl. Nares clear without erythema, swelling, mucoid exudates. Oral hygiene is good. No erythema, swelling, or exudate. Tongue normal, non-obstructing. Tonsils not swollen or erythematous. Hearing normal.  Neck: Supple, thyroid not palpable. No bruits, nodes or JVD. Respiratory: Respiratory effort normal.  BS equal and clear bilateral without rales, rhonci, wheezing or stridor. Cardio: Heart sounds are normal with regular rate and rhythm and no murmurs, rubs or gallops.  Peripheral pulses are normal and equal bilaterally without edema. No aortic or femoral bruits. Chest: symmetric with normal excursions and percussion.  Abdomen: Soft, with Nl bowel sounds. Nontender, no guarding, rebound, hernias, masses, or organomegaly.  Lymphatics: Non tender without lymphadenopathy.  Musculoskeletal: Full ROM all peripheral extremities, joint stability, 5/5 strength, and normal gait. Skin: Warm and dry without rashes, lesions, cyanosis, clubbing or  ecchymosis.  Neuro: Cranial nerves intact, reflexes flat through-out+. Normal muscle tone, no cerebellar symptoms. Sensation intact to touch, vibratory and Monofilament to the toes bilaterally. Equivocal (+) tinel's sign bilat. Pysch: Alert and oriented X 3 with normal affect, insight and judgment appropriate.   Assessment and Plan  1. Annual Preventative/Screening Exam   2. Essential hypertension  - EKG 12-Lead - [place], RETROPERITNL ABD,  LTD - Microalbumin / creatinine urine ratio - CBC with Differential/Platelet - COMPLETE METABOLIC PANEL WITH GFR - Magnesium - TSH - Urinalysis, Routine w reflex microscopic  3. Hyperlipidemia, mixed  - EKG 12-Lead - [place], RETROPERITNL ABD,  LTD - Lipid panel - TSH  4. Abnormal glucose  - EKG 12-Lead - [place], RETROPERITNL ABD,  LTD - Hemoglobin A1c - Insulin, random  5. Vitamin D deficiency  - VITAMIN D 25 Hydroxy  6. Prediabetes  - EKG 12-Lead - [place], RETROPERITNL ABD,  LTD - Hemoglobin A1c - Insulin, random  7. Idiopathic gout  - Uric acid  8. Chronic obstructive pulmonary disease  ([place])   9. BPH with obstruction/lower urinary tract symptoms  - PSA  10. Aortic atherosclerosis ([facility])  - [place], RETROPERITNL ABD,  LTD  11. Obstructive sleep apnea  -Continue CPAP  12. Hereditary and idiopathic peripheral neuropathy  - Ambulatory referral to Neurology  13. Screening for colorectal cancer  - POC Hemoccult Bld/Stl  14. Prostate cancer screening  -  PSA  15. Screening for ischemic heart disease  - EKG 12-Lead  16. FH: hypertension  - EKG 12-Lead - [place], RETROPERITNL ABD,  LTD  17. Smoker  - EKG 12-Lead - [place], RETROPERITNL ABD,  LTD  18. Screening for AAA (aortic abdominal aneurysm)  - [place], RETROPERITNL ABD,  LTD  19. Medication management  - Microalbumin / creatinine urine ratio - Uric acid - CBC with Differential/Platelet - COMPLETE METABOLIC PANEL WITH GFR - Magnesium - Lipid panel - TSH - Hemoglobin A1c - Insulin, random - VITAMIN D 25 Hydroxyl - Urinalysis, Routine w reflex microscopic  20. Encounter for screening for malignant neoplasm of respiratory organs  - CT CHEST LUNG CA SCREEN LOW  DOSE W/O CM; Future  21. Need for prophylactic vaccination and inoculation against influenza  - Flu vaccine HIGH DOSE PF (Fluzone High dose)          Patient was counseled in prudent diet, weight control to achieve/maintain BMI less than 25, BP monitoring, regular exercise and medications as discussed.  Discussed med effects and SE's. Routine screening labs and tests as requested with regular follow-up as recommended. Over 40 minutes of exam, counseling, chart review and high complex critical decision making was performed

## 2018-10-17 NOTE — Patient Instructions (Signed)

## 2018-10-18 LAB — CBC WITH DIFFERENTIAL/PLATELET
Basophils Absolute: 71 cells/uL (ref 0–200)
Basophils Relative: 0.7 %
Eosinophils Absolute: 122 cells/uL (ref 15–500)
Eosinophils Relative: 1.2 %
HCT: 50.3 % — ABNORMAL HIGH (ref 38.5–50.0)
Hemoglobin: 17.9 g/dL — ABNORMAL HIGH (ref 13.2–17.1)
Lymphs Abs: 3335 cells/uL (ref 850–3900)
MCH: 33.3 pg — ABNORMAL HIGH (ref 27.0–33.0)
MCHC: 35.6 g/dL (ref 32.0–36.0)
MCV: 93.7 fL (ref 80.0–100.0)
MPV: 11.8 fL (ref 7.5–12.5)
Monocytes Relative: 8.5 %
Neutro Abs: 5804 cells/uL (ref 1500–7800)
Neutrophils Relative %: 56.9 %
Platelets: 170 10*3/uL (ref 140–400)
RBC: 5.37 10*6/uL (ref 4.20–5.80)
RDW: 12.9 % (ref 11.0–15.0)
Total Lymphocyte: 32.7 %
WBC mixed population: 867 cells/uL (ref 200–950)
WBC: 10.2 10*3/uL (ref 3.8–10.8)

## 2018-10-18 LAB — URIC ACID: Uric Acid, Serum: 5.8 mg/dL (ref 4.0–8.0)

## 2018-10-18 LAB — TSH: TSH: 0.76 mIU/L (ref 0.40–4.50)

## 2018-10-18 LAB — LIPID PANEL
Cholesterol: 135 mg/dL (ref <200)
HDL: 42 mg/dL (ref >40)
LDL Cholesterol (Calc): 63 mg/dL (calc)
Non-HDL Cholesterol (Calc): 93 mg/dL (calc) (ref <130)
Total CHOL/HDL Ratio: 3.2 (calc) (ref <5.0)
Triglycerides: 258 mg/dL — ABNORMAL HIGH (ref <150)

## 2018-10-18 LAB — URINALYSIS, ROUTINE W REFLEX MICROSCOPIC
Bacteria, UA: NONE SEEN /HPF
Bilirubin Urine: NEGATIVE
Glucose, UA: NEGATIVE
Hgb urine dipstick: NEGATIVE
Hyaline Cast: NONE SEEN /LPF
Ketones, ur: NEGATIVE
Leukocytes, UA: NEGATIVE
Nitrite: NEGATIVE
RBC / HPF: NONE SEEN /HPF (ref 0–2)
Specific Gravity, Urine: 1.021 (ref 1.001–1.03)
Squamous Epithelial / LPF: NONE SEEN /HPF (ref <=5)
WBC, UA: NONE SEEN /HPF (ref 0–5)
pH: 5.5 (ref 5.0–8.0)

## 2018-10-18 LAB — COMPLETE METABOLIC PANEL WITH GFR
AG Ratio: 1.6 (calc) (ref 1.0–2.5)
ALT: 26 U/L (ref 9–46)
AST: 23 U/L (ref 10–35)
Albumin: 4.4 g/dL (ref 3.6–5.1)
Alkaline phosphatase (APISO): 56 U/L (ref 40–115)
BUN/Creatinine Ratio: 23 (calc) — ABNORMAL HIGH (ref 6–22)
BUN: 30 mg/dL — ABNORMAL HIGH (ref 7–25)
CO2: 29 mmol/L (ref 20–32)
Calcium: 9.9 mg/dL (ref 8.6–10.3)
Chloride: 104 mmol/L (ref 98–110)
Creat: 1.3 mg/dL — ABNORMAL HIGH (ref 0.70–1.25)
GFR, Est African American: 65 mL/min/{1.73_m2} (ref >=60)
GFR, Est Non African American: 56 mL/min/{1.73_m2} — ABNORMAL LOW (ref >=60)
Globulin: 2.8 g/dL (calc) (ref 1.9–3.7)
Glucose, Bld: 83 mg/dL (ref 65–99)
Potassium: 4.7 mmol/L (ref 3.5–5.3)
Sodium: 141 mmol/L (ref 135–146)
Total Bilirubin: 0.6 mg/dL (ref 0.2–1.2)
Total Protein: 7.2 g/dL (ref 6.1–8.1)

## 2018-10-18 LAB — HEMOGLOBIN A1C
Hgb A1c MFr Bld: 5.5 % of total Hgb (ref <5.7)
Mean Plasma Glucose: 111 (calc)
eAG (mmol/L): 6.2 (calc)

## 2018-10-18 LAB — MICROALBUMIN / CREATININE URINE RATIO
Creatinine, Urine: 119 mg/dL (ref 20–320)
Microalb Creat Ratio: 182 mcg/mg creat — ABNORMAL HIGH (ref <30)
Microalb, Ur: 21.7 mg/dL

## 2018-10-18 LAB — VITAMIN D 25 HYDROXY (VIT D DEFICIENCY, FRACTURES): Vit D, 25-Hydroxy: 53 ng/mL (ref 30–100)

## 2018-10-18 LAB — PSA: PSA: 0.2 ng/mL (ref <=4.0)

## 2018-10-18 LAB — INSULIN, RANDOM: Insulin: 5.2 u[IU]/mL (ref 2.0–19.6)

## 2018-10-18 LAB — MAGNESIUM: Magnesium: 2 mg/dL (ref 1.5–2.5)

## 2018-10-19 ENCOUNTER — Other Ambulatory Visit: Payer: Self-pay | Admitting: Internal Medicine

## 2018-10-23 ENCOUNTER — Ambulatory Visit
Admission: RE | Admit: 2018-10-23 | Discharge: 2018-10-23 | Disposition: A | Discharge status: Home / Self Care | Payer: Medicare HMO | Source: Ambulatory Visit | Attending: Internal Medicine | Admitting: Internal Medicine

## 2018-10-23 DIAGNOSIS — Z122 Encounter for screening for malignant neoplasm of respiratory organs: Secondary | ICD-10-CM

## 2018-10-23 DIAGNOSIS — R69 Illness, unspecified: Secondary | ICD-10-CM | POA: Diagnosis not present

## 2018-11-08 DIAGNOSIS — G4733 Obstructive sleep apnea (adult) (pediatric): Secondary | ICD-10-CM | POA: Diagnosis not present

## 2018-12-25 ENCOUNTER — Telehealth: Payer: Self-pay | Admitting: Diagnostic Neuroimaging

## 2018-12-25 ENCOUNTER — Encounter: Payer: Self-pay | Admitting: Diagnostic Neuroimaging

## 2018-12-25 ENCOUNTER — Ambulatory Visit: Payer: Medicare HMO | Admitting: Diagnostic Neuroimaging

## 2018-12-25 ENCOUNTER — Encounter

## 2018-12-25 VITALS — BP 142/81 | HR 60 | Ht 66.75 in | Wt 216.4 lb

## 2018-12-25 DIAGNOSIS — M48062 Spinal stenosis, lumbar region with neurogenic claudication: Secondary | ICD-10-CM

## 2018-12-25 DIAGNOSIS — M79601 Pain in right arm: Secondary | ICD-10-CM

## 2018-12-25 DIAGNOSIS — M542 Cervicalgia: Secondary | ICD-10-CM | POA: Diagnosis not present

## 2018-12-25 NOTE — Progress Notes (Signed)
[facility]  PATIENT: Stephen Holland DOB: 01-24-1951  REFERRING CLINICIAN: Essie Christine HISTORY FROM: patient  REASON FOR VISIT: new consult    HISTORICAL  CHIEF COMPLAINT:  Chief Complaint  Patient presents with  . New Patient (Initial Visit)    Referred by [facility]  . Peripheral Neuropathy    Rm 6, alone, (toes and fingers ) prog getting worse. Also neck and back pain    HISTORY OF PRESENT ILLNESS:   68 year old male here for evaluation of numbness and tingling.  Patient reports at least 8 to 10 years of numbness and tingling in his hands and feet (starting approximately 2010).  He also has neck pain, low back pain, possible right carpal tunnel syndrome (diagnosed clinically, but never had EMG testing).  He has been to orthopedic clinic in the past, had epidural steroid injection in the low back a few years ago, without relief.  He has been to neurosurgery in the past for his cervical and lumbar degenerative spine disease, and was offered surgery but no guarantee of improvement, and therefore he declined to proceed.  Since that time symptoms have continued to progress.   REVIEW OF SYSTEMS: Full 14 system review of systems performed and negative with exception of: Fatigue hearing loss ringing in ears shortness of breath snoring numbness restless legs decreased energy dizziness with activities.  ALLERGIES: Allergies  Allergen Reactions  . Lidocaine     Blisters.  Pt does not recall issue with this. 12-25-18 sy    HOME MEDICATIONS: Outpatient Medications Prior to Visit  Medication Sig Dispense Refill  . allopurinol (ZYLOPRIM) 300 MG tablet TAKE 1 TABLET (300 MG TOTAL) BY MOUTH DAILY. 90 tablet 1  . bisoprolol-hydrochlorothiazide (ZIAC) 10-6.25 MG tablet TAKE 1 TABLET BY MOUTH EVERY DAY FOR BLOOD PRESSURE 90 tablet 1  . Cholecalciferol (VITAMIN D PO) Take 10,000 Units by mouth daily.     . clonazePAM (KLONOPIN) 1 MG tablet TAKE 1/2 TO 1 TABLET BY MOUTH AT  BEDTIME AS NEEDED 90 tablet 0  . KRILL OIL PO Take 350 mg by mouth 2 times daily at 12 noon and 4 pm.     . Magnesium 400 MG CAPS Take by mouth.     . meloxicam (MOBIC) 15 MG tablet TAKE 1 TABLET BY MOUTH ONCE DAILY WITH FOOD FOR ARTHRITIS PAIN AND INFLAMMATION 90 tablet 1  . rosuvastatin (CRESTOR) 40 MG tablet TAKE 1 TABLET (40 MG TOTAL) BY MOUTH DAILY. 90 tablet 1  . telmisartan-hydrochlorothiazide (MICARDIS HCT) 80-25 MG tablet Please specify directions, refills and quantity 90 tablet 1   No facility-administered medications prior to visit.     PAST MEDICAL HISTORY: Past Medical History:  Diagnosis Date  . Colon polyps   . DJD (degenerative joint disease)   . ED (erectile dysfunction)   . Gout   . Hyperlipemia   . Hypertension   . Obstructive sleep apnea    08/2012, cpap setting of 14  . Prostate cancer ([place]) 01/2002   s/p prostatectomy  . Vitamin D deficiency     PAST SURGICAL HISTORY: Past Surgical History:  Procedure Laterality Date  . COLONOSCOPY WITH PROPOFOL N/A 09/06/2016   Procedure: COLONOSCOPY WITH PROPOFOL;  Surgeon: Garlan Fair, MD;  Location: [facility];  Service: Endoscopy;  Laterality: N/A;  . colonscopy     x 3 with polyp one time  . PROSTATECTOMY  2003  . SKIN CANCER EXCISION      FAMILY HISTORY: Family History  Problem Relation Age  of Onset  . Hypertension Mother   . Pulmonary fibrosis Mother   . Stroke Father   . Heart attack Maternal Grandmother   . Stroke Maternal Grandfather   . Stroke Paternal Grandmother   . Colon cancer Paternal Grandfather     SOCIAL HISTORY: Social History   Socioeconomic History  . Marital status: Married    Spouse name: Not on file  . Number of children: Not on file  . Years of education: Not on file  . Highest education level: Not on file  Occupational History  . Not on file  Social Needs  . Financial resource strain: Not on file  . Food insecurity:    Worry: Not on file    Inability: Not on file    . Transportation needs:    Medical: Not on file    Non-medical: Not on file  Tobacco Use  . Smoking status: Current Every Day Smoker    Packs/day: 1.00    Years: 45.00    Pack years: 45.00    Types: Cigarettes  . Smokeless tobacco: Never Used  Substance and Sexual Activity  . Alcohol use: Yes    Alcohol/week: 15.0 standard drinks    Types: 15 Standard drinks or equivalent per week    Comment: 2-3 mixed drinks per day  . Drug use: No  . Sexual activity: Not on file  Lifestyle  . Physical activity:    Days per week: Not on file    Minutes per session: Not on file  . Stress: Not on file  Relationships  . Social connections:    Talks on phone: Not on file    Gets together: Not on file    Attends religious service: Not on file    Active member of club or organization: Not on file    Attends meetings of clubs or organizations: Not on file    Relationship status: Not on file  . Intimate partner violence:    Fear of current or ex partner: Not on file    Emotionally abused: Not on file    Physically abused: Not on file    Forced sexual activity: Not on file  Other Topics Concern  . Not on file  Social History Narrative   Lives home with wife. Retired.  Education HS.  Children 2,  Caffein 4 cups daily and one coke.     PHYSICAL EXAM  GENERAL EXAM/CONSTITUTIONAL: Vitals:  Vitals:   12/25/18 0905  BP: (!) 142/81  Pulse: 60  Weight: 216 lb 6.4 oz (98.2 kg)  Height: 5' 6.75" (1.695 m)     Body mass index is 34.15 kg/m. Wt Readings from Last 3 Encounters:  12/25/18 216 lb 6.4 oz (98.2 kg)  10/17/18 211 lb 6.4 oz (95.9 kg)  07/13/18 209 lb (94.8 kg)     Patient is in no distress; well developed, nourished and groomed; neck is supple  CARDIOVASCULAR:  Examination of carotid arteries is normal; no carotid bruits  Regular rate and rhythm, no murmurs  Examination of peripheral vascular system by observation and palpation is normal  EYES:  Ophthalmoscopic exam  of optic discs and posterior segments is normal; no papilledema or hemorrhages No exam data present  MUSCULOSKELETAL:  Gait, strength, tone, movements noted in Neurologic exam below  NEUROLOGIC: MENTAL STATUS:  No flowsheet data found.  awake, alert, oriented to person, place and time  recent and remote memory intact  normal attention and concentration  language fluent, comprehension intact, naming intact  fund of knowledge appropriate  CRANIAL NERVE:   2nd - no papilledema on fundoscopic exam  2nd, 3rd, 4th, 6th - pupils equal and reactive to light, visual fields full to confrontation, extraocular muscles intact, no nystagmus  5th - facial sensation symmetric  7th - facial strength symmetric  8th - hearing intact  9th - palate elevates symmetrically, uvula midline  11th - shoulder shrug symmetric  12th - tongue protrusion midline  MOTOR:   normal bulk and tone, full strength in the BUE, BLE  EXCEPT RIGHT HAND APB WEAKNESS (SUBTLE ATROPHY)  SENSORY:   normal and symmetric to light touch, temperature, vibration  POSITIVE PHALEN'S ON RIGHT  DECR PP IN FINGER TIPS (DIGITS 1-4)  COORDINATION:   finger-nose-finger, fine finger movements normal  REFLEXES:   deep tendon reflexes present and symmetric; BUE 2; KNEES TRACE; ANKLES TRACE  GAIT/STATION:   narrow based gait; romberg is negative     DIAGNOSTIC DATA (LABS, IMAGING, TESTING) - I reviewed patient records, labs, notes, testing and imaging myself where available.  Lab Results  Component Value Date   WBC 10.2 10/17/2018   HGB 17.9 (H) 10/17/2018   HCT 50.3 (H) 10/17/2018   MCV 93.7 10/17/2018   PLT 170 10/17/2018      Component Value Date/Time   NA 141 10/17/2018 1434   K 4.7 10/17/2018 1434   CL 104 10/17/2018 1434   CO2 29 10/17/2018 1434   GLUCOSE 83 10/17/2018 1434   BUN 30 (H) 10/17/2018 1434   CREATININE 1.30 (H) 10/17/2018 1434   CALCIUM 9.9 10/17/2018 1434   PROT 7.2  10/17/2018 1434   ALBUMIN 4.1 06/13/2017 1004   AST 23 10/17/2018 1434   ALT 26 10/17/2018 1434   ALKPHOS 58 06/13/2017 1004   BILITOT 0.6 10/17/2018 1434   GFRNONAA 56 (L) 10/17/2018 1434   GFRAA 65 10/17/2018 1434   Lab Results  Component Value Date   CHOL 135 10/17/2018   HDL 42 10/17/2018   LDLCALC 63 10/17/2018   TRIG 258 (H) 10/17/2018   CHOLHDL 3.2 10/17/2018   Lab Results  Component Value Date   HGBA1C 5.5 10/17/2018   Lab Results  Component Value Date   VITAMINB12 861 07/23/2015   Lab Results  Component Value Date   TSH 0.76 10/17/2018    07/05/14 MRI cervical spine [I reviewed images myself and agree with interpretation. -VRP]  1. At C3-4 there is a mild broad-based disc bulge with moderate bilateral facet arthropathy and uncovertebral degenerative changes resulting in severe bilateral foraminal stenosis, right greater than left. 2. At C4-5 there is a small central disc protrusion with mild-moderate right facet arthropathy and uncovertebral degenerative changes resulting in severe right foraminal stenosis.  3. At C5-6 there is a mild broad-based disc bulge with left uncovertebral degenerative changes and severe left foraminal stenosis.  4. At C6-7 is a mild broad-based disc bulge with bilateral uncovertebral degenerative changes resulting in severe bilateral foraminal stenosis.  10/19/13 MRI lumbar spine [I reviewed images myself and agree with interpretation. -VRP] 1. At L3-L4 there is a mild broad-based disc bulge with flattening of the ventral thecal sac with a right far lateral disc component which is in close proximity to the right extra foraminal L3 nerve root. There is moderate bilateral facet arthropathy. 2. At L4-L5 there is a mild broad-based disc bulge with a right far lateral disc component in close proximity to the right extra foraminal L4 nerve root. Mild bilateral facet arthropathy. Mild right foraminal stenosis.  3. Mild broad-based disc bulges at L2-3  and L5-S1. 4. Bilateral SI joint osteoarthritic change.    ASSESSMENT AND PLAN   68 y.o. year old male here with numbness and tingling in hands and feet, neck pain, low back pain, since 2010.  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  May also have superimposed right carpal tunnel syndrome.  We will proceed with further work-up.  Dx:  1. Neck pain   2. Right arm pain   3. Spinal stenosis of lumbar region with neurogenic claudication     PLAN:  - check MRI cervical and lumbar spine - then may check EMG/NCS in future (? Superimposed right CTS)  Orders Placed This Encounter  Procedures  . MR CERVICAL SPINE WO CONTRAST  . MR LUMBAR SPINE WO CONTRAST   Return for pending test results.    Penni Bombard, MD 6/70/1410, 3:01 AM Certified in Neurology, Neurophysiology and Neuroimaging  [facility] [address] [place], [place] [address] [phone]

## 2018-12-25 NOTE — Telephone Encounter (Signed)
[employer] medicare order sent to GI lvm for pt to be aware. I left GI phone number of [phone] and to give them a call if he has not heard from them in the next 2-3 business days.

## 2018-12-25 NOTE — Patient Instructions (Signed)
-   check MRI cervical and lumbar spine

## 2018-12-30 ENCOUNTER — Other Ambulatory Visit: Payer: Self-pay | Admitting: Internal Medicine

## 2019-01-10 ENCOUNTER — Other Ambulatory Visit: Payer: Medicare HMO

## 2019-01-10 DIAGNOSIS — R69 Illness, unspecified: Secondary | ICD-10-CM | POA: Diagnosis not present

## 2019-01-10 NOTE — Telephone Encounter (Signed)
[employer] did not approve the MRI's.   "Based on Medicare National coverage determinations and evicore guidelines. Lower Extremity pain with Neurological features (radiculopathy or radiculitis or plexopathy and/or neuropathy) with or without low back (lumbar spine) pain, we cannot approve this request. Your records show that you have pain in your leg(s) and/or feet that may be coming from your spine. The reason this request cannot be approved is because 1. MRI might be supported in the evaluation of suspected or known spinal disease with one of the following. Failure to improve after a recent (within three months) six week trial of physician-guided clinical care with clinical re-evaluation. Any signs or symptoms such as significant motor weakness, malignancy, infection, cauda equina syndrome, for which conservative treatment is not needed. The clinical information received fails to support meeting.  Also the neck (cervical spine) pain without and with neurological features (including stenosis), we cannot approve this request . Your recorders show that you have neck pain. The reason this request cannot be approved is because 1. MRI might be supported in the evaluation of suspected or known spinal disease with one of the following. Failure to improve after a recent (within three months) six week trial of physician-guided clinical care with clinical re-evaluation. Any signs or symptoms such as significant motor weakness, malignancy, infection, cauda equina syndrome, for which conservative treatment is not needed. The clinical information received fails to support meeting. "   If you would like to do a peer to peer it would need to be scheduled by Friday February 14. The phone number is [phone] option 4 and the case number is [id number].

## 2019-01-10 NOTE — Telephone Encounter (Addendum)
Called [facility], [employer], spoke with Velna Hatchet to schedule peer to peer, MRI lumbar and cervical spine, Case [id number].  Peer to Peer Thurs 01/11/19 11:15 am est, Dr Marnee Guarneri who will call office and ask to speak to Dr Leta Baptist.

## 2019-01-11 NOTE — Telephone Encounter (Signed)
Noted, thank you

## 2019-01-11 NOTE — Telephone Encounter (Signed)
MRI lumbar --> # [id number]  MRI cervical --> # [id number]  Approved.   -VRP

## 2019-01-16 NOTE — Progress Notes (Signed)
FOLLOW UP  Assessment and Plan:  Essential hypertension - continue medications, DASH diet, exercise and monitor at home. Call if greater than 130/80.  -     CBC with Differential/Platelet -     COMPLETE METABOLIC PANEL WITH GFR -     TSH  Aortic atherosclerosis (HCC) Control blood pressure, cholesterol, glucose, increase exercise.   OSA and COPD overlap syndrome (HCC) Continue CPAP  Chronic obstructive pulmonary disease, unspecified COPD type ([place]) No triggers, well controlled symptoms, cont to monitor  Hyperlipidemia, unspecified hyperlipidemia type check lipids decrease fatty foods increase activity.  -     Lipid panel  Tobacco use disorder Smoking cessation-  instruction/counseling given, counseled patient on the dangers of tobacco use, advised patient to stop smoking, and reviewed strategies to maximize success, patient not ready to quit at this time but will consider retrying chantix  Decreased pulses in feet Pending MRI back, may need ABI's rule out claudication pain     Continue diet and meds as discussed. Further disposition pending results of labs. Discussed med's effects and SE's.   Over 30 minutes of exam, counseling, chart review, and critical decision making was performed.   Future Appointments  Date Time Provider [place]  05/02/2019 11:15 AM [name], MD [facility] None  07/17/2019  3:45 PM Vicie Mutters, PA-C [facility] None  11/07/2019  2:00 PM [name], MD [facility] None    ----------------------------------------------------------------------------------------------------------------------  HPI 68 y.o. male  presents for 3 month follow up on hypertension, cholesterol, glucose management (hx of prediabetes), obesity, COPD and vitamin D deficiency.   he has a diagnosis of COPD but currently continues to smoke 0.75 pack a day; discussed risks associated with smoking, patient is ready to quit and is planning to quit, he is  trying to make it harder for him to quit, not keeping them on him. He had low dose CT screen of his chest 10/23/2018. Has tried chantix states could not tell a diffrence.   BMI is Body mass index is 34.24 kg/m., he has been working on diet- has been working on cutting back on portions. He does not currently exercise  Due to back pain, had MRI's yesterday of neck and back. Will get back and hip pain with walking 100 yards, no leg pain with it or leg cramping.  Wt Readings from Last 3 Encounters:  01/18/19 217 lb (98.4 kg)  12/25/18 216 lb 6.4 oz (98.2 kg)  10/17/18 211 lb 6.4 oz (95.9 kg)   His blood pressure has been controlled at home, today their BP is BP: 130/80  He does not workout. He denies chest pain, shortness of breath, dizziness.   He is on cholesterol medication and denies myalgias. His LDL cholesterol is at goal; triglycerides are not at goal.The cholesterol last visit was:   Lab Results  Component Value Date   CHOL 135 10/17/2018   HDL 42 10/17/2018   LDLCALC 63 10/17/2018   TRIG 258 (H) 10/17/2018   CHOLHDL 3.2 10/17/2018    He has been working on diet for glucose management, and denies increased appetite, nausea, paresthesia of the feet, polydipsia, polyuria, visual disturbances, vomiting and weight loss. Last A1C in the office was:  Lab Results  Component Value Date   HGBA1C 5.5 10/17/2018   Patient is on Vitamin D supplement and near goal at most recent check:    Lab Results  Component Value Date   VD25OH 53 10/17/2018        Current Medications:  Current  Outpatient Medications on File Prior to Visit  Medication Sig  . allopurinol (ZYLOPRIM) 300 MG tablet TAKE 1 TABLET (300 MG TOTAL) BY MOUTH DAILY.  . bisoprolol-hydrochlorothiazide (ZIAC) 10-6.25 MG tablet TAKE 1 TABLET BY MOUTH EVERY DAY FOR BLOOD PRESSURE  . Cholecalciferol (VITAMIN D PO) Take 10,000 Units by mouth daily.   . clonazePAM (KLONOPIN) 1 MG tablet Take 1/2 to 1 tablet at Bedtime ONLY if needed &  try to limit to 5 days /week  . KRILL OIL PO Take 350 mg by mouth 2 times daily at 12 noon and 4 pm.   . Magnesium 400 MG CAPS Take by mouth.   . meloxicam (MOBIC) 15 MG tablet TAKE 1 TABLET BY MOUTH ONCE DAILY WITH FOOD FOR ARTHRITIS PAIN AND INFLAMMATION  . rosuvastatin (CRESTOR) 40 MG tablet TAKE 1 TABLET (40 MG TOTAL) BY MOUTH DAILY.  Marland Kitchen telmisartan-hydrochlorothiazide (MICARDIS HCT) 80-25 MG tablet Please specify directions, refills and quantity   No current facility-administered medications on file prior to visit.      Allergies:  Allergies  Allergen Reactions  . Lidocaine     Blisters.  Pt does not recall issue with this. 12-25-18 sy     Medical History:  Past Medical History:  Diagnosis Date  . Colon polyps   . DJD (degenerative joint disease)   . ED (erectile dysfunction)   . Gout   . Hyperlipemia   . Hypertension   . Obstructive sleep apnea    08/2012, cpap setting of 14  . Prostate cancer ([place]) 01/2002   s/p prostatectomy  . Vitamin D deficiency    Family history- Reviewed and unchanged Social history- Reviewed and unchanged   Review of Systems:  Review of Systems  Constitutional: Negative for malaise/fatigue and weight loss.  HENT: Positive for congestion. Negative for hearing loss, nosebleeds, sore throat and tinnitus.   Eyes: Negative for blurred vision and double vision.  Respiratory: Positive for cough (Mild, nonproductive). Negative for shortness of breath and wheezing.   Cardiovascular: Negative for chest pain, palpitations, orthopnea, claudication and leg swelling.  Gastrointestinal: Positive for diarrhea (4 episodes in past 2 days - no mucoid discharge or blood). Negative for abdominal pain, blood in stool, constipation, heartburn, melena, nausea and vomiting.  Genitourinary: Negative.   Musculoskeletal: Negative for joint pain and myalgias.  Skin: Negative for rash.  Neurological: Negative for dizziness, tingling, sensory change, weakness and  headaches.  Endo/Heme/Allergies: Negative for environmental allergies and polydipsia.  Psychiatric/Behavioral: Negative.   All other systems reviewed and are negative.     Physical Exam: BP 130/80   Pulse 66   Temp 97.7 F (36.5 C)   Ht 5' 6.75" (1.695 m)   Wt 217 lb (98.4 kg)   SpO2 95%   BMI 34.24 kg/m  Wt Readings from Last 3 Encounters:  01/18/19 217 lb (98.4 kg)  12/25/18 216 lb 6.4 oz (98.2 kg)  10/17/18 211 lb 6.4 oz (95.9 kg)   General Appearance: Well nourished, in no apparent distress. Eyes: PERRLA, EOMs, conjunctiva no swelling or erythema Sinuses: No Frontal/maxillary tenderness ENT/Mouth: Ext aud canals clear, TMs without erythema, bulging. No erythema, swelling, or exudate on post pharynx.  Tonsils not swollen or erythematous. Hearing normal.  Neck: Supple, thyroid normal.  Respiratory: Respiratory effort normal, BS equal bilaterally with scant coarse wheezes over bronchial areas, no rales, rhonchi, fine wheezing or stridor.  Cardio: RRR with no MRGs. Decrease bilateral TP/DP, without edema.  Abdomen: Soft, rounded, + BS.  Non tender, no  guarding, rebound, hernias, masses. Lymphatics: Non tender without lymphadenopathy.  Musculoskeletal: Full ROM, 5/5 strength, Normal gait Skin: Warm, dry without rashes, lesions, ecchymosis.  Neuro: Cranial nerves intact. No cerebellar symptoms.  Psych: Awake and oriented X 3, normal affect, Insight and Judgment appropriate.    Vicie Mutters, PA-C 11:23 AM [facility]

## 2019-01-17 ENCOUNTER — Ambulatory Visit
Admission: RE | Admit: 2019-01-17 | Discharge: 2019-01-17 | Disposition: A | Discharge status: Home / Self Care | Payer: Medicare HMO | Source: Ambulatory Visit | Attending: Diagnostic Neuroimaging | Admitting: Diagnostic Neuroimaging

## 2019-01-17 DIAGNOSIS — M79601 Pain in right arm: Secondary | ICD-10-CM

## 2019-01-17 DIAGNOSIS — M542 Cervicalgia: Secondary | ICD-10-CM | POA: Diagnosis not present

## 2019-01-17 DIAGNOSIS — M48062 Spinal stenosis, lumbar region with neurogenic claudication: Secondary | ICD-10-CM

## 2019-01-18 ENCOUNTER — Ambulatory Visit: Payer: Self-pay | Admitting: Adult Health

## 2019-01-18 ENCOUNTER — Encounter: Payer: Self-pay | Admitting: Physician Assistant

## 2019-01-18 ENCOUNTER — Ambulatory Visit (INDEPENDENT_AMBULATORY_CARE_PROVIDER_SITE_OTHER): Payer: Medicare HMO | Admitting: Physician Assistant

## 2019-01-18 VITALS — BP 130/80 | HR 66 | Temp 97.7°F | Ht 66.75 in | Wt 217.0 lb

## 2019-01-18 DIAGNOSIS — I700 Atherosclerosis of aorta: Secondary | ICD-10-CM

## 2019-01-18 DIAGNOSIS — R69 Illness, unspecified: Secondary | ICD-10-CM | POA: Diagnosis not present

## 2019-01-18 DIAGNOSIS — F17200 Nicotine dependence, unspecified, uncomplicated: Secondary | ICD-10-CM

## 2019-01-18 DIAGNOSIS — R0989 Other specified symptoms and signs involving the circulatory and respiratory systems: Secondary | ICD-10-CM

## 2019-01-18 DIAGNOSIS — J449 Chronic obstructive pulmonary disease, unspecified: Secondary | ICD-10-CM

## 2019-01-18 DIAGNOSIS — G4733 Obstructive sleep apnea (adult) (pediatric): Secondary | ICD-10-CM | POA: Diagnosis not present

## 2019-01-18 DIAGNOSIS — E785 Hyperlipidemia, unspecified: Secondary | ICD-10-CM

## 2019-01-18 DIAGNOSIS — I10 Essential (primary) hypertension: Secondary | ICD-10-CM | POA: Diagnosis not present

## 2019-01-18 NOTE — Patient Instructions (Addendum)
[place]  [employer]  [phone] for more information or for a free program for smoking cessation help.   You can call QUIT SMART 1-800-QUIT-NOW for free nicotine patches or replacement therapy- if they are out- keep calling  [facility] Can call for smoking cessation classes, [phone]  If you have a smart phone, please look up Smoke Free app, this will help you stay on track and give you information about money you have saved, life that you have gained back and a ton of more information.   We are giving you chantix for smoking cessation. You can do it! And we are here to help! You may have heard some scary side effects about chantix, the three most common I hear about are nausea, crazy dreams and depression.   However, I like for my patients to try to stay on 1/2 a tablet twice a day rather than one tablet twice a day as normally prescribed. This helps decrease the chances of side effects and helps save money by making a one month prescription last two months  Please start the prescription this way:  Start 1/2 tablet by mouth once daily after food with a full glass of water for 3 days Then do 1/2 tablet by mouth twice daily for 4 days. During this first week you can smoke, but try to stop after this week.  At this point we have several options: 1) continue on 1/2 tablet twice a day- which I encourage you to do. You can stay on this dose the rest of the time on the medication or if you still feel the need to smoke you can do one of the two options below. 2) do one tablet in the morning and 1/2 in the evening which helps decrease dreams. 3) do one tablet twice a day.   What if I miss a dose? If you miss a dose, take it as soon as you can. If it is almost time for your next dose, take only that dose. Do not take double or extra doses.  What should I watch for while using this medicine? Visit your doctor or health care professional for  regular check ups. Ask for ongoing advice and encouragement from your doctor or healthcare professional, friends, and family to help you quit. If you smoke while on this medication, quit again  Your mouth may get dry. Chewing sugarless gum or hard candy, and drinking plenty of water may help. Contact your doctor if the problem does not go away or is severe.  You may get drowsy or dizzy. Do not drive, use machinery, or do anything that needs mental alertness until you know how this medicine affects you. Do not stand or sit up quickly, especially if you are an older patient.   The use of this medicine may increase the chance of suicidal thoughts or actions. Pay special attention to how you are responding while on this medicine. Any worsening of mood, or thoughts of suicide or dying should be reported to your health care professional right away.  ADVANTAGES OF QUITTING SMOKING  Within 20 minutes, blood pressure decreases. Your pulse is at normal level.  After 8 hours, carbon monoxide levels in the blood return to normal. Your oxygen level increases.  After 24 hours, the chance of having a heart attack starts to decrease. Your breath, hair, and body stop smelling like smoke.  After 48 hours, damaged nerve endings begin to recover. Your sense of taste  and smell improve.  After 72 hours, the body is virtually free of nicotine. Your bronchial tubes relax and breathing becomes easier.  After 2 to 12 weeks, lungs can hold more air. Exercise becomes easier and circulation improves.  After 1 year, the risk of coronary heart disease is cut in half.  After 5 years, the risk of stroke falls to the same as a nonsmoker.  After 10 years, the risk of lung cancer is cut in half and the risk of other cancers decreases significantly.  After 15 years, the risk of coronary heart disease drops, usually to the level of a nonsmoker.  You will have extra money to spend on things other than  cigarettes.   Peripheral Vascular Disease Peripheral vascular disease (PVD) is a disease of the blood vessels. A simple term for PVD is poor circulation. In most cases, PVD narrows the blood vessels that carry blood from your heart to the rest of your body. This can result in a decreased supply of blood to your arms, legs, and internal organs, like your stomach or kidneys. However, it most often affects a person's lower legs and feet. There are two types of PVD.  Organic PVD. This is the more common type. It is caused by damage to the structure of blood vessels.  Functional PVD. This is caused by conditions that make blood vessels contract and tighten (spasm). Without treatment, PVD tends to get worse over time. PVD can also lead to acute limb ischemia. This is when an arm or leg suddenly has trouble getting enough blood. This is a medical emergency. What are the causes?  Each type of PVD has many different causes. The most common cause of PVD is buildup of a fatty material (plaque) inside your arteries (atherosclerosis). Small amounts of plaque can break off from the walls of the blood vessels and become lodged in a smaller artery. This blocks blood flow and can cause acute limb ischemia. Other common causes of PVD include:  Blood clots that form inside of blood vessels.  Injuries to blood vessels.  Diseases that cause inflammation of blood vessels or cause blood vessel spasms.  Health behaviors and health history that increase your risk of developing PVD. What increases the risk? You are more likely to develop this condition if:  You have a family history of PVD.  You have certain medical conditions, including: ? High cholesterol. ? Diabetes. ? High blood pressure (hypertension). ? Coronary heart disease. ? Past problems with blood clots. ? Past injury, such as burns or a broken bone. These may have damaged blood vessels in your limbs. ? Buerger disease. This is caused by  inflamed blood vessels in your hands and feet. ? Some forms of arthritis. ? Rare birth defects that affect the arteries in your legs. ? Kidney disease.  You use tobacco or smoke.  You do not get enough exercise.  You are obese.  You are age 68 or older. What are the signs or symptoms? This condition may cause different symptoms. Your symptoms depend on what part of your body is not getting enough blood. Some common signs and symptoms include:  Cramps in your lower legs. This may be a symptom of poor leg circulation (claudication).  Pain and weakness in your legs. This happens while you are physically active but goes away when you rest (intermittent claudication).  Leg pain when at rest.  Leg numbness, tingling, or weakness.  Coldness in a leg or foot, especially when compared with  the other leg.  Skin or hair changes. These can include: ? Hair loss. ? Shiny skin. ? Pale or bluish skin. ? Thick toenails.  Inability to get or maintain an erection (erectile dysfunction).  Fatigue. People with PVD are more likely to develop ulcers and sores on their toes, feet, or legs. These may take longer than normal to heal. How is this diagnosed? This condition is diagnosed based on:  Your signs and symptoms.  A physical exam and your medical history.  Other tests to find out what is causing your PVD and to determine its severity. Tests may include: ? Blood pressure recordings from your arms and legs and measurements of the strength of your pulses (pulse volume recordings). ? Imaging studies using sound waves to take pictures of the blood flow through your blood vessels (Doppler ultrasound). ? Injecting a dye into your blood vessels before having imaging studies using:  X-rays (angiogram or arteriogram).  Computer-generated X-rays (CT angiogram).  A powerful electromagnetic field and a computer (magnetic resonance angiogram or MRA). How is this treated? Treatment for PVD depends  on the cause of your condition and how severe your symptoms are. It also depends on your age. Underlying causes need to be treated and controlled. These include long-term (chronic) conditions, such as diabetes, high cholesterol, and high blood pressure. Treatment includes:  Lifestyle changes, such as: ? Quitting smoking. ? Exercising regularly. ? Following a low-fat, low-cholesterol diet.  Taking medicines, such as: ? Blood thinners to prevent blood clots. ? Medicines to improve blood flow. ? Medicines to improve your blood cholesterol levels.  Surgical procedures, such as: ? A procedure that uses an inflated balloon to open a blocked artery and improve blood flow (angioplasty). ? A procedure to put in a wire mesh tube to keep a blocked artery open (stent implant). ? Surgery to reroute blood flow around a blocked artery (peripheral bypass surgery). ? Surgery to remove dead tissue from an infected wound on the affected limb. ? Amputation. This is surgical removal of the affected limb. It may be necessary in cases of acute limb ischemia where there has been no improvement through medical or surgical treatments. Follow these instructions at home: Lifestyle  Do not use any products that contain nicotine or tobacco, such as cigarettes and e-cigarettes. If you need help quitting, ask your health care provider.  Lose weight if you are overweight, and maintain a healthy weight as discussed by your health care provider.  Eat a diet that is low in fat and cholesterol. If you need help, ask your health care provider.  Exercise regularly. Ask your health care provider to suggest some good activities for you. General instructions  Take over-the-counter and prescription medicines only as told by your health care provider.  Take good care of your feet: ? Wear comfortable shoes that fit well. ? Check your feet often for any cuts or sores.  Keep all follow-up visits as told by your health care  provider. This is important. Contact a health care provider if:  You have cramps in your legs while walking.  You have leg pain when you are at rest.  You have coldness in a leg or foot.  Your skin changes.  You have erectile dysfunction.  You have cuts or sores on your feet that are not healing. Get help right away if:  Your arm or leg turns cold, numb, and blue.  Your arms or legs become red, warm, swollen, painful, or numb.  You have  chest pain or trouble breathing.  You suddenly have weakness in your face, arm, or leg.  You become very confused or lose the ability to speak.  You suddenly have a very bad headache or lose your vision. Summary  Peripheral vascular disease (PVD) is a disease of the blood vessels.  In most cases, PVD narrows the blood vessels that carry blood from your heart to the rest of your body.  PVD may cause different symptoms. Your symptoms depend on what part of your body is not getting enough blood.  Treatment for PVD depends on the cause of your condition and how severe your symptoms are. This information is not intended to replace advice given to you by your health care provider. Make sure you discuss any questions you have with your health care provider. Document Released: 12/23/2004 Document Revised: 12/23/2016 Document Reviewed: 12/23/2016 Elsevier Interactive Patient Education  2019 [employer].

## 2019-01-19 LAB — CBC WITH DIFFERENTIAL/PLATELET
Absolute Monocytes: 837 cells/uL (ref 200–950)
Basophils Absolute: 84 cells/uL (ref 0–200)
Basophils Relative: 0.9 %
Eosinophils Absolute: 112 cells/uL (ref 15–500)
Eosinophils Relative: 1.2 %
HCT: 52.4 % — ABNORMAL HIGH (ref 38.5–50.0)
Hemoglobin: 18.3 g/dL — ABNORMAL HIGH (ref 13.2–17.1)
Lymphs Abs: 2920 cells/uL (ref 850–3900)
MCH: 33.2 pg — ABNORMAL HIGH (ref 27.0–33.0)
MCHC: 34.9 g/dL (ref 32.0–36.0)
MCV: 94.9 fL (ref 80.0–100.0)
MPV: 11.2 fL (ref 7.5–12.5)
Monocytes Relative: 9 %
Neutro Abs: 5348 cells/uL (ref 1500–7800)
Neutrophils Relative %: 57.5 %
Platelets: 170 10*3/uL (ref 140–400)
RBC: 5.52 10*6/uL (ref 4.20–5.80)
RDW: 13 % (ref 11.0–15.0)
Total Lymphocyte: 31.4 %
WBC: 9.3 10*3/uL (ref 3.8–10.8)

## 2019-01-19 LAB — COMPLETE METABOLIC PANEL WITH GFR
AG Ratio: 1.7 (calc) (ref 1.0–2.5)
ALT: 25 U/L (ref 9–46)
AST: 22 U/L (ref 10–35)
Albumin: 4.3 g/dL (ref 3.6–5.1)
Alkaline phosphatase (APISO): 61 U/L (ref 35–144)
BUN/Creatinine Ratio: 19 (calc) (ref 6–22)
BUN: 26 mg/dL — ABNORMAL HIGH (ref 7–25)
CO2: 28 mmol/L (ref 20–32)
Calcium: 10 mg/dL (ref 8.6–10.3)
Chloride: 103 mmol/L (ref 98–110)
Creat: 1.35 mg/dL — ABNORMAL HIGH (ref 0.70–1.25)
GFR, Est African American: 62 mL/min/{1.73_m2} (ref >=60)
GFR, Est Non African American: 54 mL/min/{1.73_m2} — ABNORMAL LOW (ref >=60)
Globulin: 2.5 g/dL (calc) (ref 1.9–3.7)
Glucose, Bld: 87 mg/dL (ref 65–99)
Potassium: 4.7 mmol/L (ref 3.5–5.3)
Sodium: 140 mmol/L (ref 135–146)
Total Bilirubin: 0.6 mg/dL (ref 0.2–1.2)
Total Protein: 6.8 g/dL (ref 6.1–8.1)

## 2019-01-19 LAB — LIPID PANEL
Cholesterol: 156 mg/dL (ref <200)
HDL: 43 mg/dL (ref >=40)
LDL Cholesterol (Calc): 81 mg/dL (calc)
Non-HDL Cholesterol (Calc): 113 mg/dL (calc) (ref <130)
Total CHOL/HDL Ratio: 3.6 (calc) (ref <5.0)
Triglycerides: 230 mg/dL — ABNORMAL HIGH (ref <150)

## 2019-01-19 LAB — TSH: TSH: 0.83 mIU/L (ref 0.40–4.50)

## 2019-01-25 DIAGNOSIS — R69 Illness, unspecified: Secondary | ICD-10-CM | POA: Diagnosis not present

## 2019-02-02 ENCOUNTER — Telehealth: Payer: Self-pay | Admitting: *Deleted

## 2019-02-02 NOTE — Telephone Encounter (Signed)
Spoke with patient and informed him his MRI cervical and lumbar spine showed bilateral pinched nerves. Dr Leta Baptist stated he may consider conservative management with PT vs spine surgery consult. Patient stated he would only have surgery as last resort. He has gotten injections in the past with [facility]. He decided to call them and discuss possible injections.   I advised he let us know if he decides differently. He verbalized understanding, appreciation.

## 2019-02-09 ENCOUNTER — Other Ambulatory Visit: Payer: Self-pay | Admitting: Internal Medicine

## 2019-02-20 DIAGNOSIS — G4733 Obstructive sleep apnea (adult) (pediatric): Secondary | ICD-10-CM | POA: Diagnosis not present

## 2019-02-21 ENCOUNTER — Other Ambulatory Visit: Payer: Self-pay | Admitting: Adult Health

## 2019-02-23 DIAGNOSIS — M4722 Other spondylosis with radiculopathy, cervical region: Secondary | ICD-10-CM | POA: Diagnosis not present

## 2019-02-23 DIAGNOSIS — M542 Cervicalgia: Secondary | ICD-10-CM | POA: Diagnosis not present

## 2019-02-23 DIAGNOSIS — M549 Dorsalgia, unspecified: Secondary | ICD-10-CM | POA: Diagnosis not present

## 2019-02-23 DIAGNOSIS — M5136 Other intervertebral disc degeneration, lumbar region: Secondary | ICD-10-CM | POA: Diagnosis not present

## 2019-02-23 DIAGNOSIS — M5412 Radiculopathy, cervical region: Secondary | ICD-10-CM | POA: Diagnosis not present

## 2019-02-23 DIAGNOSIS — M5030 Other cervical disc degeneration, unspecified cervical region: Secondary | ICD-10-CM | POA: Diagnosis not present

## 2019-02-23 DIAGNOSIS — M7138 Other bursal cyst, other site: Secondary | ICD-10-CM | POA: Diagnosis not present

## 2019-02-23 DIAGNOSIS — M546 Pain in thoracic spine: Secondary | ICD-10-CM | POA: Diagnosis not present

## 2019-02-23 DIAGNOSIS — M48062 Spinal stenosis, lumbar region with neurogenic claudication: Secondary | ICD-10-CM | POA: Diagnosis not present

## 2019-02-23 DIAGNOSIS — M47816 Spondylosis without myelopathy or radiculopathy, lumbar region: Secondary | ICD-10-CM | POA: Diagnosis not present

## 2019-03-15 ENCOUNTER — Other Ambulatory Visit: Payer: Self-pay | Admitting: Adult Health

## 2019-03-30 DIAGNOSIS — M48061 Spinal stenosis, lumbar region without neurogenic claudication: Secondary | ICD-10-CM | POA: Diagnosis not present

## 2019-03-30 DIAGNOSIS — M5136 Other intervertebral disc degeneration, lumbar region: Secondary | ICD-10-CM | POA: Diagnosis not present

## 2019-03-30 DIAGNOSIS — M4726 Other spondylosis with radiculopathy, lumbar region: Secondary | ICD-10-CM | POA: Diagnosis not present

## 2019-04-11 DIAGNOSIS — L814 Other melanin hyperpigmentation: Secondary | ICD-10-CM | POA: Diagnosis not present

## 2019-04-11 DIAGNOSIS — L821 Other seborrheic keratosis: Secondary | ICD-10-CM | POA: Diagnosis not present

## 2019-04-11 DIAGNOSIS — L570 Actinic keratosis: Secondary | ICD-10-CM | POA: Diagnosis not present

## 2019-04-11 DIAGNOSIS — D225 Melanocytic nevi of trunk: Secondary | ICD-10-CM | POA: Diagnosis not present

## 2019-05-02 ENCOUNTER — Encounter: Payer: Self-pay | Admitting: Internal Medicine

## 2019-05-02 ENCOUNTER — Other Ambulatory Visit: Payer: Self-pay

## 2019-05-02 ENCOUNTER — Ambulatory Visit (INDEPENDENT_AMBULATORY_CARE_PROVIDER_SITE_OTHER): Payer: Medicare HMO | Admitting: Internal Medicine

## 2019-05-02 VITALS — BP 132/76 | HR 64 | Temp 97.9°F | Resp 16 | Ht 66.75 in | Wt 212.6 lb

## 2019-05-02 DIAGNOSIS — G4733 Obstructive sleep apnea (adult) (pediatric): Secondary | ICD-10-CM

## 2019-05-02 DIAGNOSIS — M1000 Idiopathic gout, unspecified site: Secondary | ICD-10-CM | POA: Diagnosis not present

## 2019-05-02 DIAGNOSIS — I10 Essential (primary) hypertension: Secondary | ICD-10-CM

## 2019-05-02 DIAGNOSIS — J449 Chronic obstructive pulmonary disease, unspecified: Secondary | ICD-10-CM | POA: Diagnosis not present

## 2019-05-02 DIAGNOSIS — Z79899 Other long term (current) drug therapy: Secondary | ICD-10-CM

## 2019-05-02 DIAGNOSIS — E559 Vitamin D deficiency, unspecified: Secondary | ICD-10-CM | POA: Diagnosis not present

## 2019-05-02 DIAGNOSIS — R7309 Other abnormal glucose: Secondary | ICD-10-CM | POA: Diagnosis not present

## 2019-05-02 DIAGNOSIS — M545 Low back pain, unspecified: Secondary | ICD-10-CM

## 2019-05-02 DIAGNOSIS — E782 Mixed hyperlipidemia: Secondary | ICD-10-CM

## 2019-05-02 MED ORDER — GABAPENTIN 100 MG PO CAPS: ORAL_CAPSULE | ORAL | 0 refills | Status: DC

## 2019-05-02 NOTE — Progress Notes (Signed)
History of Present Illness:      This very nice 68 y.o. MWM  presents for 6 month follow up with HTN, HLD, Pre-Diabetes and Vitamin D Deficiency. Patient has COPD and OSA /on CPAP with improved restorative sleep. He also has Gout controlled on allopurinol.       Patient is treated for HTN (1980's) & BP has been controlled at home. Today's BP is at goal -132/76. Patient has had no complaints of any cardiac type chest pain, palpitations, dyspnea / orthopnea / PND, dizziness, claudication, or dependent edema.      Hyperlipidemia is controlled with diet & Rosuvastatin. Patient denies myalgias or other med SE's. Last Lipids were at goal albeit elevated Trig's: Lab Results  Component Value Date   CHOL 156 01/18/2019   HDL 43 01/18/2019   LDLCALC 81 01/18/2019   TRIG 230 (H) 01/18/2019   CHOLHDL 3.6 01/18/2019       Also, the patient has Moderate Obesity (BMI 34+) and  history of PreDiabetes (A1c 5.7% / 2011)  and has had no symptoms of reactive hypoglycemia, diabetic polys, paresthesias or visual blurring.  Last A1c was Normal & at goal: Lab Results  Component Value Date   HGBA1C 5.5 10/17/2018      Further, the patient also has history of Vitamin D Deficiency ("12" / 2009) and supplements vitamin D without any suspected side-effects. Last vitamin D was near goal (70-100): Lab Results  Component Value Date   VD25OH 53 10/17/2018   Current Outpatient Medications on File Prior to Visit  Medication Sig  . allopurinol (ZYLOPRIM) 300 MG tablet TAKE 1 TABLET (300 MG TOTAL) BY MOUTH DAILY.  . bisoprolol-hydrochlorothiazide (ZIAC) 10-6.25 MG tablet TAKE 1 TABLET BY MOUTH EVERY DAY FOR BLOOD PRESSURE  . Cholecalciferol (VITAMIN D PO) Take 10,000 Units by mouth daily.   . clonazePAM (KLONOPIN) 1 MG tablet Take 1/2 to 1 tablet at Bedtime ONLY if needed & try to limit to 5 days /week  . KRILL OIL PO Take 350 mg by mouth 2 times daily at 12 noon and 4 pm.   . Magnesium 400 MG CAPS Take by mouth.    . meloxicam (MOBIC) 15 MG tablet TAKE 1 TABLET BY MOUTH ONCE DAILY WITH FOOD FOR ARTHRITIS PAIN AND INFLAMMATION  . rosuvastatin (CRESTOR) 40 MG tablet TAKE 1 TABLET (40 MG TOTAL) BY MOUTH DAILY.  Marland Kitchen telmisartan-hydrochlorothiazide (MICARDIS HCT) 80-25 MG tablet Please specify directions, refills and quantity   No current facility-administered medications on file prior to visit.    Allergies  Allergen Reactions  . Lidocaine     Blisters.  Pt does not recall issue with this. 12-25-18 sy   PMHx:   Past Medical History:  Diagnosis Date  . Colon polyps   . DJD (degenerative joint disease)   . ED (erectile dysfunction)   . Gout   . Hyperlipemia   . Hypertension   . Obstructive sleep apnea    08/2012, cpap setting of 14  . Prostate cancer ([place]) 01/2002   s/p prostatectomy  . Vitamin D deficiency     Immunization History  Administered Date(s) Administered  . Influenza Split 08/26/2014  . Influenza, High Dose Seasonal PF 08/12/2016, 10/17/2018  . Influenza, Seasonal, Injecte, Preservative Fre 10/27/2015  . PPD Test 05/23/2014, 07/23/2015  . Pneumococcal Conjugate-13 08/26/2014  . Pneumococcal Polysaccharide-23 05/05/2010, 01/30/2016  . Td 11/30/2003  . Tdap 08/12/2010  . Zoster 04/29/2013   Past Surgical History:  Procedure Laterality Date  . COLONOSCOPY  WITH PROPOFOL N/A 09/06/2016   Procedure: COLONOSCOPY WITH PROPOFOL;  Surgeon: Garlan Fair, MD;  Location: [facility];  Service: Endoscopy;  Laterality: N/A;  . colonscopy     x 3 with polyp one time  . PROSTATECTOMY  2003  . SKIN CANCER EXCISION     FHx:    Reviewed / unchanged  SHx:    Reviewed / unchanged   Systems Review:  Constitutional: Denies fever, chills, wt changes, headaches, insomnia, fatigue, night sweats, change in appetite. Eyes: Denies redness, blurred vision, diplopia, discharge, itchy, watery eyes.  ENT: Denies discharge, congestion, post nasal drip, epistaxis, sore throat, earache, hearing loss,  dental pain, tinnitus, vertigo, sinus pain, snoring.  CV: Denies chest pain, palpitations, irregular heartbeat, syncope, dyspnea, diaphoresis, orthopnea, PND, claudication or edema. Respiratory: denies cough, dyspnea, DOE, pleurisy, hoarseness, laryngitis, wheezing.  Gastrointestinal: Denies dysphagia, odynophagia, heartburn, reflux, water brash, abdominal pain or cramps, nausea, vomiting, bloating, diarrhea, constipation, hematemesis, melena, hematochezia  or hemorrhoids. Genitourinary: Denies dysuria, frequency, urgency, nocturia, hesitancy, discharge, hematuria or flank pain. Musculoskeletal: Denies arthralgias, myalgias, stiffness, jt. swelling, pain, limping or strain/sprain.  Skin: Denies pruritus, rash, hives, warts, acne, eczema or change in skin lesion(s). Neuro: No weakness, tremor, incoordination, spasms, paresthesia or pain. Psychiatric: Denies confusion, memory loss or sensory loss. Endo: Denies change in weight, skin or hair change.  Heme/Lymph: No excessive bleeding, bruising or enlarged lymph nodes.  Physical Exam  BP 132/76   Pulse 64   Temp 97.9 F (36.6 C)   Resp 16   Ht 5' 6.75" (1.695 m)   Wt 212 lb 9.6 oz (96.4 kg)   BMI 33.55 kg/m   Appears  well nourished, well groomed  and in no distress.  Eyes: PERRLA, EOMs, conjunctiva no swelling or erythema. Sinuses: No frontal/maxillary tenderness ENT/Mouth: EAC's clear, TM's nl w/o erythema, bulging. Nares clear w/o erythema, swelling, exudates. Oropharynx clear without erythema or exudates. Oral hygiene is good. Tongue normal, non obstructing. Hearing intact.  Neck: Supple. Thyroid not palpable. Car 2+/2+ without bruits, nodes or JVD. Chest: Respirations nl with BS clear & equal w/o rales, rhonchi, wheezing or stridor.  Cor: Heart sounds normal w/ regular rate and rhythm without sig. murmurs, gallops, clicks or rubs. Peripheral pulses normal and equal  without edema.  Abdomen: Soft & bowel sounds normal. Non-tender w/o  guarding, rebound, hernias, masses or organomegaly.  Lymphatics: Unremarkable.  Musculoskeletal: Full ROM all peripheral extremities, joint stability, 5/5 strength and normal gait.  Skin: Warm, dry without exposed rashes, lesions or ecchymosis apparent.  Neuro: Cranial nerves intact, reflexes equal bilaterally. Sensory-motor testing grossly intact. Tendon reflexes grossly intact.  Pysch: Alert & oriented x 3.  Insight and judgement nl & appropriate. No ideations.  Assessment and Plan:  1. Essential hypertension  - Continue medication, monitor blood pressure at home.  - Continue DASH diet.  Reminder to go to the ER if any CP,  SOB, nausea, dizziness, severe HA, changes vision/speech.  - CBC with Differential/Platelet - COMPLETE METABOLIC PANEL WITH GFR - Magnesium - TSH  2. Hyperlipidemia, mixed  - Continue diet/meds, exercise,& lifestyle modifications.  - Continue monitor periodic cholesterol/liver & renal functions   - Lipid panel - TSH  3. Abnormal glucose  - Continue diet, exercise  - Lifestyle modifications.  - Monitor appropriate labs.  - Hemoglobin A1c - Insulin, random  4. Vitamin D deficiency  - Continue supplementation.  - VITAMIN D 25 Hydroxyl  5. 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 Medication management  - CBC with Differential/Platelet - COMPLETE METABOLIC PANEL WITH GFR - Magnesium - Lipid panel - TSH - Hemoglobin A1c - Insulin, random - VITAMIN D 25 Hydroxyl - Uric acid      Patient has also been having issues with low midline para lumbar pain w/o sciatica which bothers hem most  after he gets up and walks a distance. He is amenable to trying Gabapentin for the pain.     Discussed  regular exercise, BP monitoring, weight control to achieve /maintain BMI less than 25 and discussed med and SE's. Recommended labs to assess and monitor clinical status with further disposition pending results of labs.  I  discussed the assessment and treatment plan with the patient. The patient was provided an opportunity to ask questions and all were answered. The patient agreed with the plan and demonstrated an understanding of the instructions.  I provided over 30 minutes of exam, counseling, chart review and  complex  decision making.     The patient was advised to call back or seek an in-person evaluation if the symptoms worsen or if the condition fails to improve as anticipated.

## 2019-05-02 NOTE — Patient Instructions (Signed)

## 2019-05-03 LAB — COMPLETE METABOLIC PANEL WITH GFR
AG Ratio: 1.8 (calc) (ref 1.0–2.5)
ALT: 32 U/L (ref 9–46)
AST: 24 U/L (ref 10–35)
Albumin: 4.4 g/dL (ref 3.6–5.1)
Alkaline phosphatase (APISO): 49 U/L (ref 35–144)
BUN/Creatinine Ratio: 24 (calc) — ABNORMAL HIGH (ref 6–22)
BUN: 32 mg/dL — ABNORMAL HIGH (ref 7–25)
CO2: 29 mmol/L (ref 20–32)
Calcium: 10 mg/dL (ref 8.6–10.3)
Chloride: 102 mmol/L (ref 98–110)
Creat: 1.31 mg/dL — ABNORMAL HIGH (ref 0.70–1.25)
GFR, Est African American: 64 mL/min/{1.73_m2} (ref >=60)
GFR, Est Non African American: 56 mL/min/{1.73_m2} — ABNORMAL LOW (ref >=60)
Globulin: 2.4 g/dL (calc) (ref 1.9–3.7)
Glucose, Bld: 90 mg/dL (ref 65–99)
Potassium: 4.1 mmol/L (ref 3.5–5.3)
Sodium: 141 mmol/L (ref 135–146)
Total Bilirubin: 0.7 mg/dL (ref 0.2–1.2)
Total Protein: 6.8 g/dL (ref 6.1–8.1)

## 2019-05-03 LAB — CBC WITH DIFFERENTIAL/PLATELET
Absolute Monocytes: 943 cells/uL (ref 200–950)
Basophils Absolute: 74 cells/uL (ref 0–200)
Basophils Relative: 0.9 %
Eosinophils Absolute: 82 cells/uL (ref 15–500)
Eosinophils Relative: 1 %
HCT: 49.1 % (ref 38.5–50.0)
Hemoglobin: 16.8 g/dL (ref 13.2–17.1)
Lymphs Abs: 2157 cells/uL (ref 850–3900)
MCH: 32.6 pg (ref 27.0–33.0)
MCHC: 34.2 g/dL (ref 32.0–36.0)
MCV: 95.3 fL (ref 80.0–100.0)
MPV: 10.8 fL (ref 7.5–12.5)
Monocytes Relative: 11.5 %
Neutro Abs: 4945 cells/uL (ref 1500–7800)
Neutrophils Relative %: 60.3 %
Platelets: 151 10*3/uL (ref 140–400)
RBC: 5.15 10*6/uL (ref 4.20–5.80)
RDW: 13.1 % (ref 11.0–15.0)
Total Lymphocyte: 26.3 %
WBC: 8.2 10*3/uL (ref 3.8–10.8)

## 2019-05-03 LAB — LIPID PANEL
Cholesterol: 147 mg/dL (ref <200)
HDL: 48 mg/dL (ref >=40)
LDL Cholesterol (Calc): 74 mg/dL (calc)
Non-HDL Cholesterol (Calc): 99 mg/dL (calc) (ref <130)
Total CHOL/HDL Ratio: 3.1 (calc) (ref <5.0)
Triglycerides: 178 mg/dL — ABNORMAL HIGH (ref <150)

## 2019-05-03 LAB — HEMOGLOBIN A1C
Hgb A1c MFr Bld: 5.6 % of total Hgb (ref <5.7)
Mean Plasma Glucose: 114 (calc)
eAG (mmol/L): 6.3 (calc)

## 2019-05-03 LAB — INSULIN, RANDOM: Insulin: 6.5 u[IU]/mL

## 2019-05-03 LAB — URIC ACID: Uric Acid, Serum: 6 mg/dL (ref 4.0–8.0)

## 2019-05-03 LAB — VITAMIN D 25 HYDROXY (VIT D DEFICIENCY, FRACTURES): Vit D, 25-Hydroxy: 74 ng/mL (ref 30–100)

## 2019-05-03 LAB — MAGNESIUM: Magnesium: 1.9 mg/dL (ref 1.5–2.5)

## 2019-05-03 LAB — TSH: TSH: 0.7 mIU/L (ref 0.40–4.50)

## 2019-05-21 DIAGNOSIS — G4733 Obstructive sleep apnea (adult) (pediatric): Secondary | ICD-10-CM | POA: Diagnosis not present

## 2019-05-24 ENCOUNTER — Other Ambulatory Visit: Payer: Self-pay | Admitting: Internal Medicine

## 2019-05-24 DIAGNOSIS — M545 Low back pain, unspecified: Secondary | ICD-10-CM

## 2019-05-24 MED ORDER — GABAPENTIN 300 MG PO CAPS: ORAL_CAPSULE | ORAL | 1 refills | Status: DC

## 2019-05-29 ENCOUNTER — Telehealth: Payer: Self-pay | Admitting: *Deleted

## 2019-05-29 NOTE — Telephone Encounter (Signed)
Order for CPAP supplies faxed to [facility] at [phone].

## 2019-06-10 ENCOUNTER — Other Ambulatory Visit: Payer: Self-pay | Admitting: Adult Health

## 2019-06-10 ENCOUNTER — Other Ambulatory Visit: Payer: Self-pay | Admitting: Internal Medicine

## 2019-06-18 ENCOUNTER — Other Ambulatory Visit: Payer: Self-pay | Admitting: Internal Medicine

## 2019-06-18 DIAGNOSIS — M545 Low back pain, unspecified: Secondary | ICD-10-CM

## 2019-07-13 DIAGNOSIS — M5136 Other intervertebral disc degeneration, lumbar region: Secondary | ICD-10-CM | POA: Diagnosis not present

## 2019-07-13 DIAGNOSIS — M48062 Spinal stenosis, lumbar region with neurogenic claudication: Secondary | ICD-10-CM | POA: Diagnosis not present

## 2019-07-13 DIAGNOSIS — M47816 Spondylosis without myelopathy or radiculopathy, lumbar region: Secondary | ICD-10-CM | POA: Diagnosis not present

## 2019-07-13 DIAGNOSIS — M7138 Other bursal cyst, other site: Secondary | ICD-10-CM | POA: Diagnosis not present

## 2019-07-17 ENCOUNTER — Ambulatory Visit: Payer: Self-pay | Admitting: Physician Assistant

## 2019-07-18 DIAGNOSIS — R69 Illness, unspecified: Secondary | ICD-10-CM | POA: Diagnosis not present

## 2019-07-30 ENCOUNTER — Other Ambulatory Visit: Payer: Self-pay | Admitting: Internal Medicine

## 2019-07-30 NOTE — Progress Notes (Signed)
MEDICARE WELLNESS Assessment:   Essential hypertension WILL DO CLOSE FOLLOW UP WORK ON DIET, TAKE BP AT HOME IF STILL ELEVATED WILL ADD ON NORVASC 2.5 - continue medications, DASH diet, exercise and monitor at home. Call if greater than 130/80.  -     CBC with Differential/Platelet -     BASIC METABOLIC PANEL WITH GFR -     Hepatic function panel -     TSH  OSA and COPD overlap syndrome Continue CPAP, stop smoking, chantix sent in  Morbid Obesity with co morbidities - long discussion about weight loss, diet, and exercise CHECK WEIGHT IN 1 MONTH  Tobacco use disorder - over 50 pack year smoking history, current smoker, no symptoms.  - wants to wait to set up low dose screening CT at [facility]  Mixed hyperlipidemia -continue medications, check lipids, decrease fatty foods, increase activity.  -     Lipid panel  Prediabetes Discussed general issues about diabetes pathophysiology and management., Educational material distributed., Suggested low cholesterol diet., Encouraged aerobic exercise., Discussed foot care., Reminded to get yearly retinal exam. -     Hemoglobin A1c  Vitamin D deficiency Continue supplement  Idiopathic gout, unspecified chronicity, unspecified site Gout- recheck Uric acid as needed, Diet discussed, continue medications.  Medication management -     Magnesium  Erectile dysfunction, unspecified erectile dysfunction type Weight loss advised  Osteoarthritis, unspecified osteoarthritis type, unspecified site Weight loss advised  Encounter for Medicare annual wellness exam Due next year  Chronic obstructive pulmonary disease, unspecified COPD type ([place]) No symptoms, no medications for it, advised to quit smoking  Aortic atherosclerosis ([place]) Control blood pressure, cholesterol, glucose, increase exercise.  Will get US carotids  Over 30 minutes of exam, counseling, chart review, and critical decision making was performed Future Appointments  Date Time  Provider [place]  11/07/2019  2:00 PM [name], MD [facility] None    Plan:   During the course of the visit the patient was educated and counseled about appropriate screening and preventive services including:    Pneumococcal vaccine   Influenza vaccine  Prevnar 13  Td vaccine  Screening electrocardiogram  Colorectal cancer screening  Diabetes screening  Glaucoma screening  Nutrition counseling    Subjective:  Stephen Holland is a 68 y.o. male who presents for Medicare Annual Wellness Visit and 3 month follow up for HTN, hyperlipidemia, prediabetes, and vitamin D Def.   From dental work, xray showed deposits on carotids. Patient is smoker. Denies vision changes, speech changes, dizziness.  He does smoke, 50+ pack  Year smoking history, chantix did not help, him and his wife are interested in quitting.  Had low dose CT 09/2018 screening. He has COPD, not on meds at this time. He has OSA.   Has been having back pain, has been getting injections, not helping, suggested surgery but the patient is not interested.  Still working 2 days a week trucking.   His blood pressure has been controlled at home, today their BP is BP: (!) 146/82  He does workout. He denies chest pain, shortness of breath, dizziness. BMI is Body mass index is 35.67 kg/m. He admits to eating too much.   Wt Readings from Last 3 Encounters:  08/02/19 221 lb (100.2 kg)  05/02/19 212 lb 9.6 oz (96.4 kg)  01/18/19 217 lb (98.4 kg)   Takes klonopin for sleep but will take every other day.   He is on cholesterol medication and denies myalgias. His cholesterol is at goal.  The cholesterol last visit was:   Lab Results  Component Value Date   CHOL 147 05/02/2019   HDL 48 05/02/2019   LDLCALC 74 05/02/2019   TRIG 178 (H) 05/02/2019   CHOLHDL 3.1 05/02/2019    He has been working on diet and exercise for prediabetes, and denies paresthesia of the feet, polydipsia, polyuria and visual  disturbances.  Last A1C in the office was:  Lab Results  Component Value Date   HGBA1C 5.6 05/02/2019   Lab Results  Component Value Date   GFRNONAA 95 (L) 05/02/2019   Patient is on Vitamin D supplement.   Lab Results  Component Value Date   VD25OH 74 05/02/2019     Patient is on allopurinol for gout and does not report a recent flare.  Lab Results  Component Value Date   LABURIC 6.0 05/02/2019   Medication Review: Current Outpatient Medications on File Prior to Visit  Medication Sig Dispense Refill  . allopurinol (ZYLOPRIM) 300 MG tablet Take 1 tablet Daily to Prevent Gout 90 tablet 3  . bisoprolol-hydrochlorothiazide (ZIAC) 10-6.25 MG tablet TAKE 1 TABLET BY MOUTH EVERY DAY FOR BLOOD PRESSURE 90 tablet 1  . Cholecalciferol (VITAMIN D PO) Take 10,000 Units by mouth daily.     . clonazePAM (KLONOPIN) 1 MG tablet TAKE 1/2 TO 1 TABLET AT BEDTIME ONLY IF NEEDED & TRY TO LIMIT TO 5 DAYS /WEEK 90 tablet 0  . gabapentin (NEURONTIN) 100 MG capsule TAKE 1 TO 3 CAPSULES 3 X /DAY WITH MEALS FOR PAIN 270 capsule 3  . gabapentin (NEURONTIN) 300 MG capsule Take 1 capsule 3 x /day with Meals for Pain 270 capsule 1  . KRILL OIL PO Take 350 mg by mouth 2 times daily at 12 noon and 4 pm.     . Magnesium 400 MG CAPS Take by mouth.     . meloxicam (MOBIC) 15 MG tablet TAKE 1 TABLET BY MOUTH ONCE DAILY WITH FOOD FOR ARTHRITIS PAIN AND INFLAMMATION 90 tablet 1  . rosuvastatin (CRESTOR) 40 MG tablet TAKE 1 TABLET (40 MG TOTAL) BY MOUTH DAILY. 90 tablet 1  . telmisartan-hydrochlorothiazide (MICARDIS HCT) 80-25 MG tablet Take 1 tablet Daily for BP 90 tablet 3   No current facility-administered medications on file prior to visit.     Current Problems (verified) Patient Active Problem List   Diagnosis Date Noted  . Aortic atherosclerosis ([place]) 06/30/2017  . COPD (chronic obstructive pulmonary disease) ([place]) 06/13/2017  . Medication management 12/03/2014  . Obesity (BMI 30.0-34.9) 08/26/2014  .  Tobacco use disorder 08/26/2014  . Essential hypertension   . Hyperlipidemia, mixed   . Abnormal glucose   . OSA and COPD overlap syndrome ([place])   . Vitamin D deficiency   . DJD (degenerative joint disease)   . Primary gout   . ED (erectile dysfunction)     Screening Tests Immunization History  Administered Date(s) Administered  . Influenza Split 08/26/2014  . Influenza, High Dose Seasonal PF 08/12/2016, 10/17/2018  . Influenza, Seasonal, Injecte, Preservative Fre 10/27/2015  . PPD Test 05/23/2014, 07/23/2015  . Pneumococcal Conjugate-13 08/26/2014  . Pneumococcal Polysaccharide-23 05/05/2010, 01/30/2016  . Td 11/30/2003  . Tdap 08/12/2010  . Zoster 04/29/2013   Preventative care: Last colonoscopy: 08/2016 CXR 2015 + COPD CT chest 06/2017 IMPRESSION: Lung-RADS 1, negative. Continue annual screening with low-dose chest CT without contrast in 12 months. Aortic Atherosclerosis (ICD10-I70.0) and Emphysema (ICD10-J43.9).  Prior vaccinations: TD or Tdap: 2011   Influenza: 2018 Pneumococcal: 2011 Prevnar13:  2015 Shingles/Zostavax: 2014  Names of Other Physician/Practitioners you currently use: 1. [facility] here for primary care 2. [facility], Therapist, art, last visit 2016 3. Dr. Freda Munro and Kalman Shan, dentist, last visit 2018 q 6 months Patient Care Team: [name], MD as PCP - General (Internal Medicine)  Allergies Allergies  Allergen Reactions  . Lidocaine     Blisters.  Pt does not recall issue with this. 12-25-18 sy    SURGICAL HISTORY He  has a past surgical history that includes Prostatectomy (2003); colonscopy; Colonoscopy with propofol (N/A, 09/06/2016); and Skin cancer excision. FAMILY HISTORY His family history includes Colon cancer in his paternal grandfather; Heart attack in his maternal grandmother; Hypertension in his mother; Pulmonary fibrosis in his mother; Stroke in his father, maternal grandfather, and paternal  grandmother. SOCIAL HISTORY He  reports that he has been smoking cigarettes. He has a 45.00 pack-year smoking history. He has never used smokeless tobacco. He reports current alcohol use of about 15.0 standard drinks of alcohol per week. He reports that he does not use drugs.  MEDICARE WELLNESS OBJECTIVES: Physical activity: Current Exercise Habits: The patient does not participate in regular exercise at present Cardiac risk factors: Cardiac Risk Factors include: advanced age (>67men, >25 women);dyslipidemia;hypertension;male gender;obesity (BMI >30kg/m2);sedentary lifestyle;smoking/ tobacco exposure Depression/mood screen:   Depression screen [facility] 2/9 08/02/2019  Decreased Interest 0  Down, Depressed, Hopeless 0  PHQ - 2 Score 0    ADLs:  In your present state of health, do you have any difficulty performing the following activities: 08/02/2019 05/02/2019  Hearing? N N  Vision? N N  Difficulty concentrating or making decisions? N N  Walking or climbing stairs? N N  Dressing or bathing? N N  Doing errands, shopping? N N  Some recent data might be hidden     Cognitive Testing  Alert? Yes  Normal Appearance?Yes  Oriented to person? Yes  Place? Yes   Time? Yes  Recall of three objects?  Yes  Can perform simple calculations? Yes  Displays appropriate judgment?Yes  Can read the correct time from a watch face?Yes  EOL planning: Does Patient Have a Medical Advance Directive?: Yes Type of Advance Directive: Healthcare Power of Attorney, Living will [place] in Chart?: Yes - validated most recent copy scanned in chart (See row information)(brought in today) Would patient like information on creating a medical advance directive?: Yes (MAU/Ambulatory/Procedural Areas - Information given)   Objective:   Today's Vitals   08/02/19 1101  BP: (!) 146/82  Pulse: 67  Temp: (!) 97.3 F (36.3 C)  SpO2: 97%  Weight: 221 lb (100.2 kg)  Height: 5\' 6"  (1.676 m)  PainSc:  0-No pain   Body mass index is 35.67 kg/m.    General appearance: alert, no distress, WD/WN, male HEENT: normocephalic, sclerae anicteric, TMs pearly, nares patent, no discharge or erythema, pharynx normal Oral cavity: MMM, no lesions Neck: supple, no lymphadenopathy, no thyromegaly, no masses Heart: RRR, normal S1, S2, no murmurs Lungs: CTA bilaterally, no wheezes, rhonchi, or rales Abdomen: +bs, soft, obese non tender, non distended, no masses, no hepatomegaly, no splenomegaly Musculoskeletal: nontender, no swelling, no obvious deformity Extremities: no edema, no cyanosis, no clubbing Pulses: 2+ symmetric, upper and lower extremities, normal cap refill Neurological: alert, oriented x 3, CN2-12 intact, strength normal upper extremities and lower extremities, sensation normal throughout, DTRs 2+ throughout, no cerebellar signs, gait normal Psychiatric: normal affect, behavior normal, pleasant   Medicare Attestation  I have personally reviewed: The patient's medical and social history Their use of alcohol, tobacco or illicit drugs Their current medications and supplements The patient's functional ability including ADLs,fall risks, home safety risks, cognitive, and hearing and visual impairment Diet and physical activities Evidence for depression or mood disorders  The patient's weight, height, BMI, and visual acuity have been recorded in the chart.  I have made referrals, counseling, and provided education to the patient based on review of the above and I have provided the patient with a written personalized care plan for preventive services.     Vicie Mutters, PA-C   08/02/2019

## 2019-08-02 ENCOUNTER — Other Ambulatory Visit: Payer: Self-pay

## 2019-08-02 ENCOUNTER — Ambulatory Visit (INDEPENDENT_AMBULATORY_CARE_PROVIDER_SITE_OTHER): Payer: Medicare HMO | Admitting: Physician Assistant

## 2019-08-02 ENCOUNTER — Encounter: Payer: Self-pay | Admitting: Physician Assistant

## 2019-08-02 VITALS — BP 146/82 | HR 67 | Temp 97.3°F | Ht 66.0 in | Wt 221.0 lb

## 2019-08-02 DIAGNOSIS — E782 Mixed hyperlipidemia: Secondary | ICD-10-CM | POA: Diagnosis not present

## 2019-08-02 DIAGNOSIS — E669 Obesity, unspecified: Secondary | ICD-10-CM | POA: Diagnosis not present

## 2019-08-02 DIAGNOSIS — M1990 Unspecified osteoarthritis, unspecified site: Secondary | ICD-10-CM

## 2019-08-02 DIAGNOSIS — I10 Essential (primary) hypertension: Secondary | ICD-10-CM | POA: Diagnosis not present

## 2019-08-02 DIAGNOSIS — M1000 Idiopathic gout, unspecified site: Secondary | ICD-10-CM

## 2019-08-02 DIAGNOSIS — R7309 Other abnormal glucose: Secondary | ICD-10-CM

## 2019-08-02 DIAGNOSIS — G4733 Obstructive sleep apnea (adult) (pediatric): Secondary | ICD-10-CM

## 2019-08-02 DIAGNOSIS — Z79899 Other long term (current) drug therapy: Secondary | ICD-10-CM

## 2019-08-02 DIAGNOSIS — I700 Atherosclerosis of aorta: Secondary | ICD-10-CM

## 2019-08-02 DIAGNOSIS — F17200 Nicotine dependence, unspecified, uncomplicated: Secondary | ICD-10-CM

## 2019-08-02 DIAGNOSIS — E559 Vitamin D deficiency, unspecified: Secondary | ICD-10-CM | POA: Diagnosis not present

## 2019-08-02 DIAGNOSIS — R6889 Other general symptoms and signs: Secondary | ICD-10-CM

## 2019-08-02 DIAGNOSIS — R69 Illness, unspecified: Secondary | ICD-10-CM | POA: Diagnosis not present

## 2019-08-02 DIAGNOSIS — J449 Chronic obstructive pulmonary disease, unspecified: Secondary | ICD-10-CM

## 2019-08-02 DIAGNOSIS — Z0001 Encounter for general adult medical examination with abnormal findings: Secondary | ICD-10-CM | POA: Diagnosis not present

## 2019-08-02 DIAGNOSIS — N529 Male erectile dysfunction, unspecified: Secondary | ICD-10-CM

## 2019-08-02 DIAGNOSIS — Z0000 Encounter for general adult medical examination without abnormal findings: Secondary | ICD-10-CM

## 2019-08-02 NOTE — Patient Instructions (Signed)
 HYPERTENSION INFORMATION  Monitor your blood pressure at home, please keep a record and bring that in with you to your next office visit.   Go to the ER if any CP, SOB, nausea, dizziness, severe HA, changes vision/speech  Testing/Procedures: HOW TO TAKE YOUR BLOOD PRESSURE: Rest 5 minutes before taking your blood pressure. Don't smoke or drink caffeinated beverages for at least 30 minutes before. Take your blood pressure before (not after) you eat. Sit comfortably with your back supported and both feet on the floor (don't cross your legs). Elevate your arm to heart level on a table or a desk. Use the proper sized cuff. It should fit smoothly and snugly around your bare upper arm. There should be enough room to slip a fingertip under the cuff. The bottom edge of the cuff should be 1 inch above the crease of the elbow.  Due to a recent study, SPRINT, we have changed our goal for the systolic or top blood pressure number. Ideally we want your top number at 120.  In the SPIRNT Trial, 5000 people were randomized to a goal BP of 120 and 5000 people were randomized to a goal BP of less than 140. The patients with the goal BP at 120 had LESS DEMENTIA, LESS HEART ATTACKS, AND LESS STROKES, AS WELL AS OVERALL DECREASED MORTALITY OR DEATH RATE.   There was another study that showed taking your blood pressure medications at night decrease cardiovascular events.  However if you are on a fluid pill, please take this in the morning.   If you are willing, our goal BP is the top number of 120.  Your most recent BP: BP: (!) 146/82   Take your medications faithfully as instructed. Maintain a healthy weight. Get at least 150 minutes of aerobic exercise per week. Minimize salt intake. Minimize alcohol intake  DASH Eating Plan DASH stands for "Dietary Approaches to Stop Hypertension." The DASH eating plan is a healthy eating plan that has been shown to reduce high blood pressure (hypertension).  Additional health benefits may include reducing the risk of type 2 diabetes mellitus, heart disease, and stroke. The DASH eating plan may also help with weight loss. WHAT DO I NEED TO KNOW ABOUT THE DASH EATING PLAN? For the DASH eating plan, you will follow these general guidelines: Choose foods with a percent daily value for sodium of less than 5% (as listed on the food label). Use salt-free seasonings or herbs instead of table salt or sea salt. Check with your health care provider or pharmacist before using salt substitutes. Eat lower-sodium products, often labeled as "lower sodium" or "no salt added." Eat fresh foods. Eat more vegetables, fruits, and low-fat dairy products. Choose whole grains. Look for the word "whole" as the first word in the ingredient list. Choose fish and skinless chicken or turkey more often than red meat. Limit fish, poultry, and meat to 6 oz (170 g) each day. Limit sweets, desserts, sugars, and sugary drinks. Choose heart-healthy fats. Limit cheese to 1 oz (28 g) per day. Eat more home-cooked food and less restaurant, buffet, and fast food. Limit fried foods. Cook foods using methods other than frying. Limit canned vegetables. If you do use them, rinse them well to decrease the sodium. When eating at a restaurant, ask that your food be prepared with less salt, or no salt if possible. WHAT FOODS CAN I EAT? Seek help from a dietitian for individual calorie needs. Grains Whole grain or whole wheat bread. Brown rice.   Whole grain or whole wheat pasta. Quinoa, bulgur, and whole grain cereals. Low-sodium cereals. Corn or whole wheat flour tortillas. Whole grain cornbread. Whole grain crackers. Low-sodium crackers. Vegetables Fresh or frozen vegetables (raw, steamed, roasted, or grilled). Low-sodium or reduced-sodium tomato and vegetable juices. Low-sodium or reduced-sodium tomato sauce and paste. Low-sodium or reduced-sodium canned vegetables.  Fruits All fresh, canned  (in natural juice), or frozen fruits. Meat and Other Protein Products Ground beef (85% or leaner), grass-fed beef, or beef trimmed of fat. Skinless chicken or turkey. Ground chicken or turkey. Pork trimmed of fat. All fish and seafood. Eggs. Dried beans, peas, or lentils. Unsalted nuts and seeds. Unsalted canned beans. Dairy Low-fat dairy products, such as skim or 1% milk, 2% or reduced-fat cheeses, low-fat ricotta or cottage cheese, or plain low-fat yogurt. Low-sodium or reduced-sodium cheeses. Fats and Oils Tub margarines without trans fats. Light or reduced-fat mayonnaise and salad dressings (reduced sodium). Avocado. Safflower, olive, or canola oils. Natural peanut or almond butter. Other Unsalted popcorn and pretzels. The items listed above may not be a complete list of recommended foods or beverages. Contact your dietitian for more options. WHAT FOODS ARE NOT RECOMMENDED? Grains White bread. White pasta. White rice. Refined cornbread. Bagels and croissants. Crackers that contain trans fat. Vegetables Creamed or fried vegetables. Vegetables in a cheese sauce. Regular canned vegetables. Regular canned tomato sauce and paste. Regular tomato and vegetable juices. Fruits Dried fruits. Canned fruit in light or heavy syrup. Fruit juice. Meat and Other Protein Products Fatty cuts of meat. Ribs, chicken wings, bacon, sausage, bologna, salami, chitterlings, fatback, hot dogs, bratwurst, and packaged luncheon meats. Salted nuts and seeds. Canned beans with salt. Dairy Whole or 2% milk, cream, half-and-half, and cream cheese. Whole-fat or sweetened yogurt. Full-fat cheeses or blue cheese. Nondairy creamers and whipped toppings. Processed cheese, cheese spreads, or cheese curds. Condiments Onion and garlic salt, seasoned salt, table salt, and sea salt. Canned and packaged gravies. Worcestershire sauce. Tartar sauce. Barbecue sauce. Teriyaki sauce. Soy sauce, including reduced sodium. Steak sauce. Fish  sauce. Oyster sauce. Cocktail sauce. Horseradish. Ketchup and mustard. Meat flavorings and tenderizers. Bouillon cubes. Hot sauce. Tabasco sauce. Marinades. Taco seasonings. Relishes. Fats and Oils Butter, stick margarine, lard, shortening, ghee, and bacon fat. Coconut, palm kernel, or palm oils. Regular salad dressings. Other Pickles and olives. Salted popcorn and pretzels. The items listed above may not be a complete list of foods and beverages to avoid. Contact your dietitian for more information. WHERE CAN I FIND MORE INFORMATION? National Heart, Lung, and Blood Institute: www.nhlbi.nih.gov/health/health-topics/topics/dash/ Document Released: 11/04/2011 Document Revised: 04/01/2014 Document Reviewed: 09/19/2013 Exit[employer] Patient Information 2015 [employer]. This information is not intended to replace advice given to you by your health care provider. Make sure you discuss any questions you have with your health care provider.    

## 2019-08-03 LAB — CBC WITH DIFFERENTIAL/PLATELET
Absolute Monocytes: 1071 cells/uL — ABNORMAL HIGH (ref 200–950)
Basophils Absolute: 62 cells/uL (ref 0–200)
Basophils Relative: 0.6 %
Eosinophils Absolute: 103 cells/uL (ref 15–500)
Eosinophils Relative: 1 %
HCT: 46.7 % (ref 38.5–50.0)
Hemoglobin: 16.1 g/dL (ref 13.2–17.1)
Lymphs Abs: 2750 cells/uL (ref 850–3900)
MCH: 33.8 pg — ABNORMAL HIGH (ref 27.0–33.0)
MCHC: 34.5 g/dL (ref 32.0–36.0)
MCV: 97.9 fL (ref 80.0–100.0)
MPV: 11.3 fL (ref 7.5–12.5)
Monocytes Relative: 10.4 %
Neutro Abs: 6314 cells/uL (ref 1500–7800)
Neutrophils Relative %: 61.3 %
Platelets: 163 10*3/uL (ref 140–400)
RBC: 4.77 10*6/uL (ref 4.20–5.80)
RDW: 12.4 % (ref 11.0–15.0)
Total Lymphocyte: 26.7 %
WBC: 10.3 10*3/uL (ref 3.8–10.8)

## 2019-08-03 LAB — COMPLETE METABOLIC PANEL WITH GFR
AG Ratio: 1.8 (calc) (ref 1.0–2.5)
ALT: 24 U/L (ref 9–46)
AST: 21 U/L (ref 10–35)
Albumin: 4.2 g/dL (ref 3.6–5.1)
Alkaline phosphatase (APISO): 55 U/L (ref 35–144)
BUN/Creatinine Ratio: 22 (calc) (ref 6–22)
BUN: 27 mg/dL — ABNORMAL HIGH (ref 7–25)
CO2: 28 mmol/L (ref 20–32)
Calcium: 9.6 mg/dL (ref 8.6–10.3)
Chloride: 104 mmol/L (ref 98–110)
Creat: 1.23 mg/dL (ref 0.70–1.25)
GFR, Est African American: 69 mL/min/{1.73_m2} (ref >=60)
GFR, Est Non African American: 60 mL/min/{1.73_m2} (ref >=60)
Globulin: 2.3 g/dL (calc) (ref 1.9–3.7)
Glucose, Bld: 87 mg/dL (ref 65–99)
Potassium: 4 mmol/L (ref 3.5–5.3)
Sodium: 139 mmol/L (ref 135–146)
Total Bilirubin: 0.4 mg/dL (ref 0.2–1.2)
Total Protein: 6.5 g/dL (ref 6.1–8.1)

## 2019-08-03 LAB — LIPID PANEL
Cholesterol: 150 mg/dL (ref <200)
HDL: 41 mg/dL (ref >=40)
LDL Cholesterol (Calc): 73 mg/dL (calc)
Non-HDL Cholesterol (Calc): 109 mg/dL (calc) (ref <130)
Total CHOL/HDL Ratio: 3.7 (calc) (ref <5.0)
Triglycerides: 271 mg/dL — ABNORMAL HIGH (ref <150)

## 2019-08-03 LAB — MAGNESIUM: Magnesium: 1.9 mg/dL (ref 1.5–2.5)

## 2019-08-03 LAB — HEMOGLOBIN A1C
Hgb A1c MFr Bld: 5.3 % of total Hgb (ref <5.7)
Mean Plasma Glucose: 105 (calc)
eAG (mmol/L): 5.8 (calc)

## 2019-08-03 LAB — TSH: TSH: 0.65 mIU/L (ref 0.40–4.50)

## 2019-08-09 ENCOUNTER — Ambulatory Visit (HOSPITAL_COMMUNITY)
Admission: RE | Admit: 2019-08-09 | Discharge: 2019-08-09 | Disposition: A | Discharge status: Home / Self Care | Payer: Medicare HMO | Source: Ambulatory Visit | Attending: Cardiology | Admitting: Cardiology

## 2019-08-09 ENCOUNTER — Other Ambulatory Visit: Payer: Self-pay

## 2019-08-09 DIAGNOSIS — R69 Illness, unspecified: Secondary | ICD-10-CM | POA: Diagnosis not present

## 2019-08-09 DIAGNOSIS — F17200 Nicotine dependence, unspecified, uncomplicated: Secondary | ICD-10-CM

## 2019-08-21 DIAGNOSIS — G4733 Obstructive sleep apnea (adult) (pediatric): Secondary | ICD-10-CM | POA: Diagnosis not present

## 2019-09-03 NOTE — Progress Notes (Signed)
Assessment and Plan: Diagnoses and all orders for this visit:  Essential hypertension - continue medications, DASH diet, exercise and monitor at home. Call if greater than 130/80.   Obesity (BMI 30.0-34.9) - follow up 2 months for progress monitoring - increase veggies, decrease carbs, increase water to at least 60 oz a day - goal is 212 or 210 - long discussion about weight loss, diet, and exercise  Flu vaccine need -     Flu vaccine HIGH DOSE PF  Future Appointments  Date Time Provider [place]  09/05/2019  2:30 PM Vicie Mutters, PA-C [facility] None  11/07/2019  2:00 PM [name], MD [facility] None  08/05/2020 11:15 AM Vicie Mutters, PA-C [facility] None     HPI 68 y.o.male presents for 1 month follow up for blood pressure and weight management.  He has been cutting out red meat and fried seafood.  Weight has been down and his BP readings at home have been better.  BP Readings from Last 3 Encounters:  09/05/19 120/80  08/02/19 (!) 146/82  05/02/19 132/76   BMI is Body mass index is 35.02 kg/m., he is working on diet and exercise. Wt Readings from Last 3 Encounters:  09/05/19 217 lb (98.4 kg)  08/02/19 221 lb (100.2 kg)  05/02/19 212 lb 9.6 oz (96.4 kg)   He is drinking 1/2 soda a day, weekends whole one.  He is drinking He has cut back on BREAKFAST meats, fried seafood Doing backed sea food Has been walking his dog, increasing activity.   Patient Active Problem List   Diagnosis Date Noted  . Aortic atherosclerosis ([place]) 06/30/2017  . COPD (chronic obstructive pulmonary disease) ([place]) 06/13/2017  . Medication management 12/03/2014  . Obesity (BMI 30.0-34.9) 08/26/2014  . Tobacco use disorder 08/26/2014  . Essential hypertension   . Hyperlipidemia, mixed   . Abnormal glucose   . OSA and COPD overlap syndrome ([place])   . Vitamin D deficiency   . DJD (degenerative joint disease)   . Primary gout   . ED (erectile dysfunction)        Current Outpatient Medications (Cardiovascular):  .  bisoprolol-hydrochlorothiazide (ZIAC) 10-6.25 MG tablet, TAKE 1 TABLET BY MOUTH EVERY DAY FOR BLOOD PRESSURE .  rosuvastatin (CRESTOR) 40 MG tablet, TAKE 1 TABLET (40 MG TOTAL) BY MOUTH DAILY. Marland Kitchen  telmisartan-hydrochlorothiazide (MICARDIS HCT) 80-25 MG tablet, Take 1 tablet Daily for BP   Current Outpatient Medications (Analgesics):  .  allopurinol (ZYLOPRIM) 300 MG tablet, Take 1 tablet Daily to Prevent Gout .  meloxicam (MOBIC) 15 MG tablet, TAKE 1 TABLET BY MOUTH ONCE DAILY WITH FOOD FOR ARTHRITIS PAIN AND INFLAMMATION   Current Outpatient Medications (Other):  Marland Kitchen  Cholecalciferol (VITAMIN D PO), Take 10,000 Units by mouth daily.  .  clonazePAM (KLONOPIN) 1 MG tablet, TAKE 1/2 TO 1 TABLET AT BEDTIME ONLY IF NEEDED & TRY TO LIMIT TO 5 DAYS /WEEK .  gabapentin (NEURONTIN) 100 MG capsule, TAKE 1 TO 3 CAPSULES 3 X /DAY WITH MEALS FOR PAIN .  KRILL OIL PO, Take 350 mg by mouth 2 times daily at 12 noon and 4 pm.  .  Magnesium 400 MG CAPS, Take by mouth.  .  gabapentin (NEURONTIN) 300 MG capsule, Take 1 capsule 3 x /day with Meals for Pain  Allergies  Allergen Reactions  . Lidocaine     Blisters.  Pt does not recall issue with this. 12-25-18 sy    ROS: all negative except above.   Physical Exam: Danley Danker  Weights   09/05/19 1402  Weight: 217 lb (98.4 kg)   BP 120/80   Pulse 64   Temp (!) 97.5 F (36.4 C)   Wt 217 lb (98.4 kg)   SpO2 99%   BMI 35.02 kg/m  General Appearance: Well nourished, in no apparent distress. Eyes: PERRLA, EOMs, conjunctiva no swelling or erythema Sinuses: No Frontal/maxillary tenderness ENT/Mouth: Ext aud canals clear, TMs without erythema, bulging. No erythema, swelling, or exudate on post pharynx.  Tonsils not swollen or erythematous. Hearing normal.  Neck: Supple, thyroid normal.  Respiratory: Respiratory effort normal, BS equal bilaterally without rales, rhonchi, wheezing or stridor.  Cardio:  RRR with no MRGs. Brisk peripheral pulses without edema.  Abdomen: Soft, + BS.  Non tender, no guarding, rebound, hernias, masses. Lymphatics: Non tender without lymphadenopathy.  Musculoskeletal: Full ROM, 5/5 strength, normal gait.  Skin: Warm, dry without rashes, lesions, ecchymosis.  Neuro: Cranial nerves intact. Normal muscle tone, no cerebellar symptoms. Sensation intact.  Psych: Awake and oriented X 3, normal affect, Insight and Judgment appropriate.     Vicie Mutters, PA-C 2:07 PM [facility]

## 2019-09-05 ENCOUNTER — Ambulatory Visit (INDEPENDENT_AMBULATORY_CARE_PROVIDER_SITE_OTHER): Payer: Medicare HMO | Admitting: Physician Assistant

## 2019-09-05 ENCOUNTER — Other Ambulatory Visit: Payer: Self-pay

## 2019-09-05 ENCOUNTER — Encounter: Payer: Self-pay | Admitting: Physician Assistant

## 2019-09-05 VITALS — BP 120/80 | HR 64 | Temp 97.5°F | Wt 217.0 lb

## 2019-09-05 DIAGNOSIS — Z23 Encounter for immunization: Secondary | ICD-10-CM | POA: Diagnosis not present

## 2019-09-05 DIAGNOSIS — E669 Obesity, unspecified: Secondary | ICD-10-CM | POA: Diagnosis not present

## 2019-09-05 DIAGNOSIS — I10 Essential (primary) hypertension: Secondary | ICD-10-CM | POA: Diagnosis not present

## 2019-09-05 NOTE — Patient Instructions (Signed)
Try [place] sausage or chicken sausage Add in one more 20 oz of just water for a total of 60 oz a dAY OR 3 BOTTLES, GOAL IS 4 BOTTELS OR 80 OZ  WATER IS IMPORTANT  Being dehydrated can hurt your kidneys, cause fatigue, headaches, muscle aches, joint pain, and dry skin/nails so please increase your fluids.   Drink 80-100 oz a day of water, measure it out! Eat 3 meals a day, have to do breakfast, eat protein- hard boiled eggs, protein bar like [place] protein bar, greek yogurt like oikos triple zero, chobani 100, or light n fit greek  Can check out plantnanny app on your phone to help you keep track of your water   General eating tips  What to Avoid . Avoid added sugars o Often added sugar can be found in processed foods such as many condiments, dry cereals, cakes, cookies, chips, crisps, crackers, candies, sweetened drinks, etc.  o Read labels and AVOID/DECREASE use of foods with the following in their ingredient list: Sugar, fructose, high fructose corn syrup, sucrose, glucose, maltose, dextrose, molasses, cane sugar, brown sugar, any type of syrup, agave nectar, etc.   . Avoid snacking in between meals- drink water or if you feel you need a snack, pick a high water content snack such as cucumbers, watermelon, or any veggie.  Marland Kitchen Avoid foods made with flour o If you are going to eat food made with flour, choose those made with whole-grains; and, minimize your consumption as much as is tolerable . Avoid processed foods o These foods are generally stocked in the middle of the grocery store.  o Focus on shopping on the perimeter of the grocery.  What to Include . Vegetables o GREEN LEAFY VEGETABLES: Kale, spinach, mustard greens, collard greens, cabbage, broccoli, etc. o OTHER: Asparagus, cauliflower, eggplant, carrots, peas, Brussel sprouts, tomatoes, bell peppers, zucchini, beets, cucumbers, etc. . Grains, seeds, and legumes o Beans: kidney beans, black eyed peas, garbanzo beans,  black beans, pinto beans, etc. o Whole, unrefined grains: brown rice, barley, bulgur, oatmeal, etc. . Healthy fats  o Avoid highly processed fats such as vegetable oil o Examples of healthy fats: avocado, olives, virgin olive oil, dark chocolate (?72% Cocoa), nuts (peanuts, almonds, walnuts, cashews, pecans, etc.) o Please still do small amount of these healthy fats, they are dense in calories.  . Low - Moderate Intake of Animal Sources of Protein o Meat sources: chicken, [place], salmon, tuna. Limit to 4 ounces of meat at one time or the size of your palm. o Consider limiting dairy sources, but when choosing dairy focus on: PLAIN [place] yogurt, cottage cheese, high-protein milk . Fruit o Choose berries

## 2019-09-20 DIAGNOSIS — G4733 Obstructive sleep apnea (adult) (pediatric): Secondary | ICD-10-CM | POA: Diagnosis not present

## 2019-10-21 DIAGNOSIS — G4733 Obstructive sleep apnea (adult) (pediatric): Secondary | ICD-10-CM | POA: Diagnosis not present

## 2019-11-05 ENCOUNTER — Other Ambulatory Visit: Payer: Self-pay | Admitting: Internal Medicine

## 2019-11-05 MED ORDER — AZITHROMYCIN 250 MG PO TABS: ORAL_TABLET | ORAL | 0 refills | Status: DC

## 2019-11-06 ENCOUNTER — Encounter: Payer: Self-pay | Admitting: Internal Medicine

## 2019-11-06 NOTE — Patient Instructions (Signed)

## 2019-11-06 NOTE — Progress Notes (Signed)
Annual  Screening/Preventative Visit  & Comprehensive Evaluation & Examination     This very nice 68 y.o. MWM presents for a Screening /Preventative Visit & comprehensive evaluation and management of multiple medical co-morbidities.  Patient has been followed for HTN, HLD, Prediabetes and Vitamin D Deficiency. Patient has hx/o Gout controlled on his Allopurinol. Patient has OSA on CPAP with improved sleep hygiene.       Patient has >35 pk yr smoking hx &  I discussed lung cancer screening with him.He was agreeable to undergo a screening low dose CT scan of the chest. We discussed smoking cessation techniques/options. I will refer him  for a LDCT lung scan & discussed smoking cessation options.     HTN predates circa 1980's. Patient's BP has been controlled at home.  Today's BP is at goal - 138/82. Patient denies any cardiac symptoms as chest pain, palpitations, shortness of breath, dizziness or ankle swelling.     Patient's hyperlipidemia is controlled with diet and medications. Patient denies myalgias or other medication SE's. Last lipids were at goal albeit elevated Trig's:  Lab Results  Component Value Date   CHOL 150 08/02/2019   HDL 41 08/02/2019   LDLCALC 73 08/02/2019   TRIG 271 (H) 08/02/2019   CHOLHDL 3.7 08/02/2019      Patient has moderate severe obesity and hx/o prediabetes (A1c 5.7% / 2011)  and patient denies reactive hypoglycemic symptoms, visual blurring, diabetic polys or paresthesias. Last A1c was Normal & at goal:  Lab Results  Component Value Date   HGBA1C 5.3 08/02/2019       Finally, patient has  history of Vitamin D Deficiency ("12" / 2009)  and last vitamin D was at goal:  Lab Results  Component Value Date   VD25OH 74 05/02/2019    Current Outpatient Medications on File Prior to Visit  Medication Sig  . allopurinol (ZYLOPRIM) 300 MG tablet Take 1 tablet Daily to Prevent Gout  . bisoprolol-hydrochlorothiazide (ZIAC) 10-6.25 MG tablet TAKE 1 TABLET BY  MOUTH EVERY DAY FOR BLOOD PRESSURE  . Cholecalciferol (VITAMIN D PO) Take 10,000 Units by mouth daily.   . clonazePAM (KLONOPIN) 1 MG tablet TAKE 1/2 TO 1 TABLET AT BEDTIME ONLY IF NEEDED & TRY TO LIMIT TO 5 DAYS /WEEK  . gabapentin (NEURONTIN) 100 MG capsule TAKE 1 TO 3 CAPSULES 3 X /DAY WITH MEALS FOR PAIN  . KRILL OIL PO Take 350 mg by mouth 2 times daily at 12 noon and 4 pm.   . Magnesium 400 MG CAPS Take by mouth.   . meloxicam (MOBIC) 15 MG tablet TAKE 1 TABLET BY MOUTH ONCE DAILY WITH FOOD FOR ARTHRITIS PAIN AND INFLAMMATION  . rosuvastatin (CRESTOR) 40 MG tablet TAKE 1 TABLET (40 MG TOTAL) BY MOUTH DAILY.  Marland Kitchen telmisartan-hydrochlorothiazide (MICARDIS HCT) 80-25 MG tablet Take 1 tablet Daily for BP   No current facility-administered medications on file prior to visit.    Allergies  Allergen Reactions  . Lidocaine     Blisters.  Pt does not recall issue with this. 12-25-18 sy   Past Medical History:  Diagnosis Date  . Colon polyps   . DJD (degenerative joint disease)   . ED (erectile dysfunction)   . Gout   . Hyperlipemia   . Hypertension   . Obstructive sleep apnea    08/2012, cpap setting of 14  . Prostate cancer ([place]) 01/2002   s/p prostatectomy  . Vitamin D deficiency    Health Maintenance  Topic  Date Due  . Fecal DNA (Cologuard)  08/10/2018  . TETANUS/TDAP  08/12/2020  . INFLUENZA VACCINE  Completed  . Hepatitis C Screening  Completed  . PNA vac Low Risk Adult  Completed   Immunization History  Administered Date(s) Administered  . Influenza Split 08/26/2014  . Influenza, High Dose Seasonal PF 08/12/2016, 10/17/2018, 09/05/2019  . Influenza, Seasonal, Injecte, Preservative Fre 10/27/2015  . PPD Test 05/23/2014, 07/23/2015  . Pneumococcal Conjugate-13 08/26/2014  . Pneumococcal Polysaccharide-23 05/05/2010, 01/30/2016  . Td 11/30/2003  . Tdap 08/12/2010  . Zoster 04/29/2013   Last Colon - 09/06/2016 - Dr Mellody Memos - recc 10 yr f/u  - due Oct 2027.  Past  Surgical History:  Procedure Laterality Date  . COLONOSCOPY WITH PROPOFOL N/A 09/06/2016   Procedure: COLONOSCOPY WITH PROPOFOL;  Surgeon: Garlan Fair, MD;  Location: [facility];  Service: Endoscopy;  Laterality: N/A;  . colonscopy     x 3 with polyp one time  . PROSTATECTOMY  2003  . SKIN CANCER EXCISION     Family History  Problem Relation Age of Onset  . Hypertension Mother   . Pulmonary fibrosis Mother   . Stroke Father   . Heart attack Maternal Grandmother   . Stroke Maternal Grandfather   . Stroke Paternal Grandmother   . Colon cancer Paternal Grandfather    Social History   Socioeconomic History  . Marital status: Married    Spouse name: Not on file  . Number of children: Not on file  Occupational History  . 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 Smoking status: Current Every Day Smoker    Packs/day: 1.00    Years: 45.00    Pack years: 45.00    Types: Cigarettes  . Smokeless tobacco: Never Used  Substance and Sexual Activity  . Alcohol use: Yes    Alcohol/week: 15.0 standard drinks    Types: 15 Standard drinks or equivalent per week    Comment: 2-3 mixed drinks per day  . Drug use: No  . Sexual activity: Not on file  Social History Narrative   Lives home with wife. Retired.  Education HS.  Children 2,  Caffein 4 cups daily and one coke.    ROS Constitutional: Denies fever, chills, weight loss/gain, headaches, insomnia,  night sweats or change in appetite. Does c/o fatigue. Eyes: Denies redness, blurred vision, diplopia, discharge, itchy or watery eyes.  ENT: Denies discharge, congestion, post nasal drip, epistaxis, sore throat, earache, hearing loss, dental pain, Tinnitus, Vertigo, Sinus pain or snoring.  Cardio: Denies chest pain, palpitations, irregular heartbeat, syncope, dyspnea, diaphoresis, orthopnea, PND, claudication or edema Respiratory: denies cough, dyspnea, DOE, pleurisy, hoarseness,  laryngitis or wheezing.  Gastrointestinal: Denies dysphagia, heartburn, reflux, water brash, pain, cramps, nausea, vomiting, bloating, diarrhea, constipation, hematemesis, melena, hematochezia, jaundice or hemorrhoids Genitourinary: Denies dysuria, frequency, discharge, hematuria or flank pain. Has urgency, nocturia x 2-3 & occasional hesitancy. Musculoskeletal: Denies arthralgia, myalgia, stiffness, Jt. Swelling, pain, limp or strain/sprain. Denies Falls. Skin: Denies puritis, rash, hives, warts, acne, eczema or change in skin lesion Neuro: No weakness, tremor, incoordination, spasms, paresthesia or pain Psychiatric: Denies confusion, memory loss or sensory loss. Denies Depression. Endocrine: Denies change in weight, skin, hair change, nocturia, and paresthesia, diabetic polys, visual blurring or hyper / hypo glycemic episodes.  Heme/Lymph: No excessive bleeding, bruising or enlarged lymph nodes.  Physical Exam  BP 138/82   Pulse 72  Temp (!) 97.2 F (36.2 C)   Resp 18   Ht 5' 6.5" (1.689 m)   Wt 216 lb 6.4 oz (98.2 kg)   BMI 34.40 kg/m   General Appearance: Over nourished and well groomed and in no apparent distress.  Eyes: PERRLA, EOMs, conjunctiva no swelling or erythema, normal fundi and vessels. Sinuses: No frontal/maxillary tenderness ENT/Mouth: EACs patent / TMs  nl. Nares clear without erythema, swelling, mucoid exudates. Oral hygiene is good. No erythema, swelling, or exudate. Tongue normal, non-obstructing. Tonsils not swollen or erythematous. Hearing normal.  Neck: Supple, thyroid not palpable. No bruits, nodes or JVD. Respiratory: Respiratory effort normal.  BS equal and clear bilateral without rales, rhonci, wheezing or stridor. Cardio: Heart sounds are normal with regular rate and rhythm and no murmurs, rubs or gallops. Peripheral pulses are normal and equal bilaterally without edema. No aortic or femoral bruits. Chest: symmetric with normal excursions and percussion.   Abdomen: Soft, with Nl bowel sounds. Nontender, no guarding, rebound, hernias, masses, or organomegaly.  Lymphatics: Non tender without lymphadenopathy.  Musculoskeletal: Full ROM all peripheral extremities, joint stability, 5/5 strength, and normal gait. Skin: Warm and dry without rashes, lesions, cyanosis, clubbing or  ecchymosis.  Neuro: Cranial nerves intact, reflexes equal bilaterally. Normal muscle tone, no cerebellar symptoms. Sensation intact.  Pysch: Alert and oriented X 3 with normal affect, insight and judgment appropriate.   Assessment and Plan  1. Annual Preventative/Screening Exam   2. Essential hypertension  - EKG 12-Lead - [place], retroperitnl abd,  ltd - Urinalysis, Routine w reflex microscopic - Microalbumin / Creatinine Urine Ratio - CBC with Diff - COMPLETE METABOLIC PANEL WITH GFR - Magnesium - TSH  3. Hyperlipidemia, mixed  - EKG 12-Lead - [place], retroperitnl abd,  ltd - Lipid Profile - TSH  4. Abnormal glucose  - EKG 12-Lead - [place], retroperitnl abd,  ltd - Hemoglobin A1c ([facility]) - Insulin, random  5. Vitamin D deficiency  - Vitamin D (25 hydroxy)  6. 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 Idiopathic gout, unspecified chronicity, unspecified site  - Uric acid  9. BPH with obstruction/lower urinary tract symptoms  - PSA  10. Screening for colorectal cancer  - POC Hemoccult Bld/Stl  11. Prostate cancer screening  - PSA  12. Screening for ischemic heart disease  - EKG 12-Lead  13. FH: hypertension  - EKG 12-Lead - [place], retroperitnl abd,  ltd  14. Smoker  - EKG 12-Lead - [place], retroperitnl abd,  ltd  15. Aortic atherosclerosis ([facility])  - [place], retroperitnl abd,  ltd  16. Screening for AAA (aortic abdominal aneurysm)  - [place], retroperitnl abd,  ltd  17. Encounter for screening for malignant neoplasm of respiratory organs   - CT CHEST LUNG CA SCREEN LOW DOSE W/O CM; Future  18. Medication management  - Urinalysis, Routine w reflex microscopic - Microalbumin / Creatinine Urine Ratio - Uric acid - CBC with Diff - COMPLETE METABOLIC PANEL WITH GFR - Magnesium - Lipid Profile - TSH - Hemoglobin A1c ([facility]) - Insulin, random - Vitamin D (25 hydroxy)        Patient was counseled in prudent diet, weight control to achieve/maintain BMI less than 25, BP monitoring, regular exercise and medications as discussed.  Discussed med effects and SE's. Routine screening labs and tests as requested with regular follow-up as recommended. Over 40 minutes of exam,  counseling, chart review and high complex critical decision making was performed   Kirtland Bouchard, MD

## 2019-11-07 ENCOUNTER — Ambulatory Visit (INDEPENDENT_AMBULATORY_CARE_PROVIDER_SITE_OTHER): Payer: Medicare HMO | Admitting: Internal Medicine

## 2019-11-07 ENCOUNTER — Other Ambulatory Visit: Payer: Self-pay

## 2019-11-07 VITALS — BP 138/82 | HR 72 | Temp 97.2°F | Resp 18 | Ht 66.5 in | Wt 216.4 lb

## 2019-11-07 DIAGNOSIS — Z1211 Encounter for screening for malignant neoplasm of colon: Secondary | ICD-10-CM

## 2019-11-07 DIAGNOSIS — N401 Enlarged prostate with lower urinary tract symptoms: Secondary | ICD-10-CM | POA: Diagnosis not present

## 2019-11-07 DIAGNOSIS — Z79899 Other long term (current) drug therapy: Secondary | ICD-10-CM | POA: Diagnosis not present

## 2019-11-07 DIAGNOSIS — R7303 Prediabetes: Secondary | ICD-10-CM

## 2019-11-07 DIAGNOSIS — G4733 Obstructive sleep apnea (adult) (pediatric): Secondary | ICD-10-CM

## 2019-11-07 DIAGNOSIS — R7309 Other abnormal glucose: Secondary | ICD-10-CM

## 2019-11-07 DIAGNOSIS — Z122 Encounter for screening for malignant neoplasm of respiratory organs: Secondary | ICD-10-CM

## 2019-11-07 DIAGNOSIS — E559 Vitamin D deficiency, unspecified: Secondary | ICD-10-CM

## 2019-11-07 DIAGNOSIS — Z136 Encounter for screening for cardiovascular disorders: Secondary | ICD-10-CM

## 2019-11-07 DIAGNOSIS — M1000 Idiopathic gout, unspecified site: Secondary | ICD-10-CM

## 2019-11-07 DIAGNOSIS — Z0000 Encounter for general adult medical examination without abnormal findings: Secondary | ICD-10-CM | POA: Diagnosis not present

## 2019-11-07 DIAGNOSIS — E782 Mixed hyperlipidemia: Secondary | ICD-10-CM | POA: Diagnosis not present

## 2019-11-07 DIAGNOSIS — J449 Chronic obstructive pulmonary disease, unspecified: Secondary | ICD-10-CM

## 2019-11-07 DIAGNOSIS — Z1212 Encounter for screening for malignant neoplasm of rectum: Secondary | ICD-10-CM

## 2019-11-07 DIAGNOSIS — I700 Atherosclerosis of aorta: Secondary | ICD-10-CM

## 2019-11-07 DIAGNOSIS — M545 Low back pain, unspecified: Secondary | ICD-10-CM

## 2019-11-07 DIAGNOSIS — I10 Essential (primary) hypertension: Secondary | ICD-10-CM | POA: Diagnosis not present

## 2019-11-07 DIAGNOSIS — N138 Other obstructive and reflux uropathy: Secondary | ICD-10-CM | POA: Diagnosis not present

## 2019-11-07 DIAGNOSIS — F17200 Nicotine dependence, unspecified, uncomplicated: Secondary | ICD-10-CM

## 2019-11-07 DIAGNOSIS — Z125 Encounter for screening for malignant neoplasm of prostate: Secondary | ICD-10-CM | POA: Diagnosis not present

## 2019-11-07 DIAGNOSIS — Z8249 Family history of ischemic heart disease and other diseases of the circulatory system: Secondary | ICD-10-CM | POA: Diagnosis not present

## 2019-11-07 DIAGNOSIS — G609 Hereditary and idiopathic neuropathy, unspecified: Secondary | ICD-10-CM

## 2019-11-07 DIAGNOSIS — Z0001 Encounter for general adult medical examination with abnormal findings: Secondary | ICD-10-CM

## 2019-11-07 MED ORDER — GABAPENTIN 600 MG PO TABS: ORAL_TABLET | ORAL | 1 refills | Status: DC

## 2019-11-07 MED ORDER — PREDNISONE 20 MG PO TABS: ORAL_TABLET | ORAL | 0 refills | Status: DC

## 2019-11-08 ENCOUNTER — Encounter (HOSPITAL_COMMUNITY): Payer: Self-pay

## 2019-11-08 ENCOUNTER — Emergency Department (HOSPITAL_COMMUNITY)
Admission: EM | Admit: 2019-11-08 | Discharge: 2019-11-08 | Disposition: A | Discharge status: Home / Self Care | Payer: Medicare HMO | Attending: Emergency Medicine | Admitting: Emergency Medicine

## 2019-11-08 ENCOUNTER — Telehealth: Payer: Self-pay | Admitting: *Deleted

## 2019-11-08 ENCOUNTER — Other Ambulatory Visit: Payer: Self-pay | Admitting: Internal Medicine

## 2019-11-08 ENCOUNTER — Other Ambulatory Visit: Payer: Self-pay

## 2019-11-08 DIAGNOSIS — M549 Dorsalgia, unspecified: Secondary | ICD-10-CM | POA: Diagnosis not present

## 2019-11-08 DIAGNOSIS — Z791 Long term (current) use of non-steroidal anti-inflammatories (NSAID): Secondary | ICD-10-CM | POA: Insufficient documentation

## 2019-11-08 DIAGNOSIS — J449 Chronic obstructive pulmonary disease, unspecified: Secondary | ICD-10-CM | POA: Diagnosis not present

## 2019-11-08 DIAGNOSIS — N179 Acute kidney failure, unspecified: Secondary | ICD-10-CM | POA: Insufficient documentation

## 2019-11-08 DIAGNOSIS — F17210 Nicotine dependence, cigarettes, uncomplicated: Secondary | ICD-10-CM | POA: Insufficient documentation

## 2019-11-08 DIAGNOSIS — R69 Illness, unspecified: Secondary | ICD-10-CM | POA: Diagnosis not present

## 2019-11-08 DIAGNOSIS — G8929 Other chronic pain: Secondary | ICD-10-CM | POA: Diagnosis not present

## 2019-11-08 DIAGNOSIS — Z79899 Other long term (current) drug therapy: Secondary | ICD-10-CM | POA: Insufficient documentation

## 2019-11-08 DIAGNOSIS — I10 Essential (primary) hypertension: Secondary | ICD-10-CM | POA: Insufficient documentation

## 2019-11-08 DIAGNOSIS — R799 Abnormal finding of blood chemistry, unspecified: Secondary | ICD-10-CM | POA: Diagnosis present

## 2019-11-08 DIAGNOSIS — E875 Hyperkalemia: Secondary | ICD-10-CM | POA: Diagnosis not present

## 2019-11-08 LAB — I-STAT CHEM 8, ED
BUN: 39 mg/dL — ABNORMAL HIGH (ref 8–23)
Calcium, Ion: 1.18 mmol/L (ref 1.15–1.40)
Chloride: 103 mmol/L (ref 98–111)
Creatinine, Ser: 1.5 mg/dL — ABNORMAL HIGH (ref 0.61–1.24)
Glucose, Bld: 81 mg/dL (ref 70–99)
HCT: 52 % (ref 39.0–52.0)
Hemoglobin: 17.7 g/dL — ABNORMAL HIGH (ref 13.0–17.0)
Potassium: 4.7 mmol/L (ref 3.5–5.1)
Sodium: 138 mmol/L (ref 135–145)
TCO2: 28 mmol/L (ref 22–32)

## 2019-11-08 LAB — CBC WITH DIFFERENTIAL/PLATELET
Abs Immature Granulocytes: 0.19 10*3/uL — ABNORMAL HIGH (ref 0.00–0.07)
Absolute Monocytes: 842 cells/uL (ref 200–950)
Basophils Absolute: 79 cells/uL (ref 0–200)
Basophils Absolute: 0.1 10*3/uL (ref 0.0–0.1)
Basophils Relative: 0.8 %
Basophils Relative: 1 %
Eosinophils Absolute: 158 cells/uL (ref 15–500)
Eosinophils Absolute: 0.1 10*3/uL (ref 0.0–0.5)
Eosinophils Relative: 1.6 %
Eosinophils Relative: 1 %
HCT: 50.3 % — ABNORMAL HIGH (ref 38.5–50.0)
HCT: 52.5 % — ABNORMAL HIGH (ref 39.0–52.0)
Hemoglobin: 17.6 g/dL — ABNORMAL HIGH (ref 13.2–17.1)
Hemoglobin: 17.5 g/dL — ABNORMAL HIGH (ref 13.0–17.0)
Immature Granulocytes: 2 %
Lymphocytes Relative: 23 %
Lymphs Abs: 2881 cells/uL (ref 850–3900)
Lymphs Abs: 2.7 10*3/uL (ref 0.7–4.0)
MCH: 33.7 pg — ABNORMAL HIGH (ref 27.0–33.0)
MCH: 32.8 pg (ref 26.0–34.0)
MCHC: 35 g/dL (ref 32.0–36.0)
MCHC: 33.3 g/dL (ref 30.0–36.0)
MCV: 96.2 fL (ref 80.0–100.0)
MCV: 98.3 fL (ref 80.0–100.0)
MPV: 11.1 fL (ref 7.5–12.5)
Monocytes Absolute: 1.1 10*3/uL — ABNORMAL HIGH (ref 0.1–1.0)
Monocytes Relative: 8.5 %
Monocytes Relative: 9 %
Neutro Abs: 5940 cells/uL (ref 1500–7800)
Neutro Abs: 7.8 10*3/uL — ABNORMAL HIGH (ref 1.7–7.7)
Neutrophils Relative %: 60 %
Neutrophils Relative %: 64 %
Platelets: 176 10*3/uL (ref 140–400)
Platelets: 186 10*3/uL (ref 150–400)
RBC: 5.23 10*6/uL (ref 4.20–5.80)
RBC: 5.34 MIL/uL (ref 4.22–5.81)
RDW: 13.8 % (ref 11.0–15.0)
RDW: 14.6 % (ref 11.5–15.5)
Total Lymphocyte: 29.1 %
WBC: 9.9 10*3/uL (ref 3.8–10.8)
WBC: 12.1 10*3/uL — ABNORMAL HIGH (ref 4.0–10.5)
nRBC: 0 % (ref 0.0–0.2)

## 2019-11-08 LAB — COMPLETE METABOLIC PANEL WITH GFR
AG Ratio: 1.4 (calc) (ref 1.0–2.5)
ALT: 34 U/L (ref 9–46)
AST: 29 U/L (ref 10–35)
Albumin: 4.4 g/dL (ref 3.6–5.1)
Alkaline phosphatase (APISO): 73 U/L (ref 35–144)
BUN/Creatinine Ratio: 23 (calc) — ABNORMAL HIGH (ref 6–22)
BUN: 34 mg/dL — ABNORMAL HIGH (ref 7–25)
CO2: 20 mmol/L (ref 20–32)
Calcium: 10.6 mg/dL — ABNORMAL HIGH (ref 8.6–10.3)
Chloride: 109 mmol/L (ref 98–110)
Creat: 1.48 mg/dL — ABNORMAL HIGH (ref 0.70–1.25)
GFR, Est African American: 56 mL/min/{1.73_m2} — ABNORMAL LOW (ref >=60)
GFR, Est Non African American: 48 mL/min/{1.73_m2} — ABNORMAL LOW (ref >=60)
Globulin: 3.1 g/dL (calc) (ref 1.9–3.7)
Glucose, Bld: 85 mg/dL (ref 65–99)
Potassium: 6.2 mmol/L (ref 3.5–5.3)
Sodium: 150 mmol/L — ABNORMAL HIGH (ref 135–146)
Total Bilirubin: 0.5 mg/dL (ref 0.2–1.2)
Total Protein: 7.5 g/dL (ref 6.1–8.1)

## 2019-11-08 LAB — LIPID PANEL
Cholesterol: 180 mg/dL (ref <200)
HDL: 45 mg/dL (ref >=40)
LDL Cholesterol (Calc): 89 mg/dL (calc)
Non-HDL Cholesterol (Calc): 135 mg/dL (calc) — ABNORMAL HIGH (ref <130)
Total CHOL/HDL Ratio: 4 (calc) (ref <5.0)
Triglycerides: 343 mg/dL — ABNORMAL HIGH (ref <150)

## 2019-11-08 LAB — URINALYSIS, ROUTINE W REFLEX MICROSCOPIC
Bacteria, UA: NONE SEEN /HPF
Bilirubin Urine: NEGATIVE
Glucose, UA: NEGATIVE
Hgb urine dipstick: NEGATIVE
Hyaline Cast: NONE SEEN /LPF
Leukocytes,Ua: NEGATIVE
Nitrite: NEGATIVE
RBC / HPF: NONE SEEN /HPF (ref 0–2)
Specific Gravity, Urine: 1.023 (ref 1.001–1.03)
Squamous Epithelial / HPF: NONE SEEN /HPF (ref <=5)
pH: <=5 (ref 5.0–8.0)

## 2019-11-08 LAB — VITAMIN D 25 HYDROXY (VIT D DEFICIENCY, FRACTURES): Vit D, 25-Hydroxy: 63 ng/mL (ref 30–100)

## 2019-11-08 LAB — BASIC METABOLIC PANEL
Anion gap: 12 (ref 5–15)
BUN: 37 mg/dL — ABNORMAL HIGH (ref 8–23)
CO2: 25 mmol/L (ref 22–32)
Calcium: 9.4 mg/dL (ref 8.9–10.3)
Chloride: 102 mmol/L (ref 98–111)
Creatinine, Ser: 1.48 mg/dL — ABNORMAL HIGH (ref 0.61–1.24)
GFR calc Af Amer: 56 mL/min — ABNORMAL LOW (ref >60)
GFR calc non Af Amer: 48 mL/min — ABNORMAL LOW (ref >60)
Glucose, Bld: 86 mg/dL (ref 70–99)
Potassium: 4.7 mmol/L (ref 3.5–5.1)
Sodium: 139 mmol/L (ref 135–145)

## 2019-11-08 LAB — PSA: PSA: 0.3 ng/mL (ref <=4.0)

## 2019-11-08 LAB — MAGNESIUM: Magnesium: 2 mg/dL (ref 1.5–2.5)

## 2019-11-08 LAB — MICROALBUMIN / CREATININE URINE RATIO
Creatinine, Urine: 160 mg/dL (ref 20–320)
Microalb Creat Ratio: 84 mcg/mg creat — ABNORMAL HIGH (ref <30)
Microalb, Ur: 13.4 mg/dL

## 2019-11-08 LAB — URIC ACID: Uric Acid, Serum: 5.7 mg/dL (ref 4.0–8.0)

## 2019-11-08 LAB — HEMOGLOBIN A1C
Hgb A1c MFr Bld: 5.7 % of total Hgb — ABNORMAL HIGH (ref <5.7)
Mean Plasma Glucose: 117 (calc)
eAG (mmol/L): 6.5 (calc)

## 2019-11-08 LAB — INSULIN, RANDOM: Insulin: 25.9 u[IU]/mL — ABNORMAL HIGH

## 2019-11-08 LAB — TSH: TSH: 1.39 mIU/L (ref 0.40–4.50)

## 2019-11-08 MED ORDER — TELMISARTAN 80 MG PO TABS: ORAL_TABLET | ORAL | 3 refills | Status: DC

## 2019-11-08 NOTE — ED Provider Notes (Addendum)
Stephen Holland   CSN: [id number] Arrival date & time: 11/08/19  1025     History Chief Complaint  Patient presents with  . Abnormal Lab    Stephen Holland is a 68 y.o. male.  80 yo M with a chief complaints of worsening renal function.  Patient was seen as a routine physical and found to have worsening renal function and hyperkalemia.  Was called them today and told to come to the ED.  Patient has had some chronic back pain that is unchanged.  Otherwise denies any significant symptoms.  Denies chest pain shortness of breath denies abdominal pain denies nausea vomiting diarrhea denies decreased oral intake denies lower extremity edema.  He has had an issue where he urinates and then has some dribbling afterwards.  Going on for about 3 months or so.  Feels like he completely empties his bladder.  The history is provided by the patient.  Abnormal Lab Illness Severity:  Moderate Onset quality:  Gradual Duration:  1 day Timing:  Constant Progression:  Worsening Chronicity:  New Associated symptoms: no abdominal pain, no chest pain, no congestion, no diarrhea, no fever, no headaches, no myalgias, no rash, no shortness of breath and no vomiting        Past Medical History:  Diagnosis Date  . Colon polyps   . DJD (degenerative joint disease)   . ED (erectile dysfunction)   . Gout   . Hyperlipemia   . Hypertension   . Obstructive sleep apnea    08/2012, cpap setting of 14  . Prostate cancer ([place]) 01/2002   s/p prostatectomy  . Vitamin D deficiency     Patient Active Problem List   Diagnosis Date Noted  . Aortic atherosclerosis ([place]) 06/30/2017  . COPD (chronic obstructive pulmonary disease) ([place]) 06/13/2017  . Medication management 12/03/2014  . Obesity (BMI 30.0-34.9) 08/26/2014  . Tobacco use disorder 08/26/2014  . Essential hypertension   . Hyperlipidemia, mixed   . Abnormal glucose   . OSA and COPD overlap syndrome  ([place])   . Vitamin D deficiency   . DJD (degenerative joint disease)   . Primary gout   . ED (erectile dysfunction)     Past Surgical History:  Procedure Laterality Date  . COLONOSCOPY WITH PROPOFOL N/A 09/06/2016   Procedure: COLONOSCOPY WITH PROPOFOL;  Surgeon: Garlan Fair, MD;  Location: [facility];  Service: Endoscopy;  Laterality: N/A;  . colonscopy     x 3 with polyp one time  . PROSTATECTOMY  2003  . SKIN CANCER EXCISION         Family History  Problem Relation Age of Onset  . Hypertension Mother   . Pulmonary fibrosis Mother   . Stroke Father   . Heart attack Maternal Grandmother   . Stroke Maternal Grandfather   . Stroke Paternal Grandmother   . Colon cancer Paternal Grandfather     Social History   Tobacco Use  . Smoking status: Current Every Day Smoker    Packs/day: 1.00    Years: 45.00    Pack years: 45.00    Types: Cigarettes  . Smokeless tobacco: Never Used  Substance Use Topics  . Alcohol use: Yes    Alcohol/week: 15.0 standard drinks    Types: 15 Standard drinks or equivalent per week    Comment: 2-3 mixed drinks per day  . Drug use: No    Home Medications Prior to Admission medications   Medication Sig Start Date  End Date Taking? Authorizing Provider  allopurinol (ZYLOPRIM) 300 MG tablet Take 1 tablet Daily to Prevent Gout Patient taking differently: Take 300 mg by mouth daily.  06/10/19  Yes [name], MD  Ascorbic Acid (VITAMIN C) 1000 MG tablet Take 1,000 mg by mouth daily.   Yes [provider]  bisoprolol-hydrochlorothiazide (ZIAC) 10-6.25 MG tablet TAKE 1 TABLET BY MOUTH EVERY DAY FOR BLOOD PRESSURE Patient taking differently: Take 1 tablet by mouth daily.  02/21/19  Yes [name], MD  Cholecalciferol (VITAMIN D3) 125 MCG (5000 UT) CAPS Take 10,000 Units by mouth daily.   Yes [provider]  clonazePAM (KLONOPIN) 1 MG tablet TAKE 1/2 TO 1 TABLET AT BEDTIME ONLY IF NEEDED & TRY TO LIMIT TO 5 DAYS /WEEK  Patient taking differently: Take 0.5-1 mg by mouth at bedtime as needed (restless leg syndrome).  07/30/19  Yes [name], MD  gabapentin (NEURONTIN) 100 MG capsule Take 300 mg by mouth 2 (two) times daily.   Yes [provider]  gabapentin (NEURONTIN) 600 MG tablet Take 1/2 to 1 tablet 2 to 3 x /Daily as needed for Neuropathy Pain Patient taking differently: Take 300-600 mg by mouth 3 (three) times daily as needed (neuropathy pain).  11/07/19  Yes [name], MD  KRILL OIL PO Take 700 mg by mouth daily.    Yes [provider]  Magnesium 400 MG CAPS Take 800 mg by mouth daily.    Yes [provider]  meloxicam (MOBIC) 15 MG tablet TAKE 1 TABLET BY MOUTH ONCE DAILY WITH FOOD FOR ARTHRITIS PAIN AND INFLAMMATION Patient taking differently: Take 15 mg by mouth daily.  03/15/19  Yes [name], MD  predniSONE (DELTASONE) 20 MG tablet 1 tab 3 x day for 3 days, then 1 tab 2 x day for 3 days, then 1 tab 1 x day for 5 days Patient taking differently: Take 20 mg by mouth See admin instructions. Taper as directed - Take 1 tablet three times daily for 3 days, then 1 tablet twice daily for 3 days, then 1 tablet daily for 5 days as needed for gout attacks 11/07/19  Yes [name], MD  rosuvastatin (CRESTOR) 40 MG tablet TAKE 1 TABLET (40 MG TOTAL) BY MOUTH DAILY. 02/09/19  Yes [name], MD  telmisartan-hydrochlorothiazide (MICARDIS HCT) 80-25 MG tablet Take 1 tablet Daily for BP Patient taking differently: Take 1 tablet by mouth daily.  06/10/19  Yes [name], MD  zinc gluconate 50 MG tablet Take 50 mg by mouth daily.   Yes [provider]    Allergies    Patient has no known allergies.  Review of Systems   Review of Systems  Constitutional: Negative for chills and fever.  HENT: Negative for congestion and facial swelling.   Eyes: Negative for discharge and visual disturbance.  Respiratory: Negative for shortness of breath.    Cardiovascular: Negative for chest pain and palpitations.  Gastrointestinal: Negative for abdominal pain, diarrhea and vomiting.  Musculoskeletal: Negative for arthralgias and myalgias.  Skin: Negative for color change and rash.  Neurological: Negative for tremors, syncope and headaches.  Psychiatric/Behavioral: Negative for confusion and dysphoric mood.    Physical Exam Updated Vital Signs BP 137/78   Pulse 60   Temp 98.6 F (37 C) (Oral)   Resp 20   Ht 5' 6.5" (1.689 m)   Wt 98.2 kg   SpO2 97%   BMI 34.40 kg/m   Physical Exam Vitals and nursing Holland reviewed.  Constitutional:  Appearance: He is well-developed. He is obese.  HENT:     Head: Normocephalic and atraumatic.  Eyes:     Pupils: Pupils are equal, round, and reactive to light.  Neck:     Vascular: No JVD.  Cardiovascular:     Rate and Rhythm: Normal rate and regular rhythm.     Heart sounds: No murmur. No friction rub. No gallop.   Pulmonary:     Effort: No respiratory distress.     Breath sounds: No wheezing.  Abdominal:     General: There is no distension.     Tenderness: There is no guarding or rebound.  Musculoskeletal:        General: Normal range of motion.     Cervical back: Normal range of motion and neck supple.  Skin:    Coloration: Skin is not pale.     Findings: No rash.  Neurological:     Mental Status: He is alert and oriented to person, place, and time.  Psychiatric:        Behavior: Behavior normal.     ED Results / Procedures / Treatments   Labs (all labs ordered are listed, but only abnormal results are displayed) Labs Reviewed  CBC WITH DIFFERENTIAL/PLATELET - Abnormal; Notable for the following components:      Result Value   WBC 12.1 (*)    Hemoglobin 17.5 (*)    HCT 52.5 (*)    Neutro Abs 7.8 (*)    Monocytes Absolute 1.1 (*)    Abs Immature Granulocytes 0.19 (*)    All other components within normal limits  BASIC METABOLIC PANEL - Abnormal; Notable for the  following components:   BUN 37 (*)    Creatinine, Ser 1.48 (*)    GFR calc non Af Amer 48 (*)    GFR calc Af Amer 56 (*)    All other components within normal limits  I-STAT CHEM 8, ED - Abnormal; Notable for the following components:   BUN 39 (*)    Creatinine, Ser 1.50 (*)    Hemoglobin 17.7 (*)    All other components within normal limits    EKG EKG Interpretation  Date/Time:  Thursday November 08 2019 11:52:27 EST Ventricular Rate:  63 PR Interval:    QRS Duration: 68 QT Interval:  400 QTC Calculation: 410 R Axis:   12 Text Interpretation: Sinus rhythm Anteroseptal infarct, age indeterminate Baseline wander in lead(s) V2 no peaked ts, no qrs widening. Otherwise no significant change Confirmed by Deno Etienne [phone]) on 11/08/2019 1:39:22 PM   Radiology No results found.  Procedures Procedures (including critical care time)  Medications Ordered in ED Medications - No data to display  ED Course  I have reviewed the triage vital signs and the nursing notes.  Pertinent labs & imaging results that were available during my care of the patient were reviewed by me and considered in my medical decision making (see chart for details).    MDM Rules/Calculators/A&P     CHA2DS2/VAS Stroke Risk Points      N/A >= 2 Points: High Risk  1 - 1.99 Points: Medium Risk  0 Points: Low Risk    A final score could not be computed because of missing components.: Last  Change: N/A     This score determines the patient's risk of having a stroke if the  patient has atrial fibrillation.      This score is not applicable to this patient. Components are not  calculated.  68 yo M with a chief complaints of acute kidney injury and hyperkalemia.  This was noted on a routine lab check that was done yesterday.  Patient otherwise feels fine and denies any symptoms.  We will recheck here today.  Obtain an EKG to evaluate for changes.  Patient's potassium today is normal.  He  does have a mild acute kidney injury.  Not so significant that I feel he needs come to the hospital.  I will have him discuss with his family doctor his blood pressure medications, see if they want to continue them with his mild bump in his renal function and hyperkalemia in the office.  1:39 PM:  I have discussed the diagnosis/risks/treatment options with the patient and family and believe the pt to be eligible for discharge home to follow-up with PCP. We also discussed returning to the ED immediately if new or worsening sx occur. We discussed the sx which are most concerning (e.g., sudden worsening pain, fever, inability to tolerate by mouth) that necessitate immediate return. Medications administered to the patient during their visit and any new prescriptions provided to the patient are listed below.  Medications given during this visit Medications - No data to display   The patient appears reasonably screen and/or stabilized for discharge and I doubt any other medical condition or other [facility] requiring further screening, evaluation, or treatment in the ED at this time prior to discharge.     Final Clinical Impression(s) / ED Diagnoses Final diagnoses:  AKI (acute kidney injury) [facility])    Rx / DC Orders ED Discharge Orders    None       Deno Etienne, DO 11/08/19 [place], DO 11/08/19 1339

## 2019-11-08 NOTE — ED Triage Notes (Signed)
Patient states he was called by his PCP office and reported that he had a elevated K+-6.7. Patient states he is having dribbling after urinating.

## 2019-11-08 NOTE — Telephone Encounter (Signed)
Per Dr Melford Aase, the patient was advised to go to [facility] ED due to critical lab results, of hyper kalemia and acute kidney injury. ED was notified the patient will be coming from home.

## 2019-11-08 NOTE — Discharge Instructions (Signed)
Please discuss with your family doctor your blood pressure medicine, the telmisartan/hydrochlorothiazide.  This medicine can make your potassium go up.  Please see if they would hold this medicine or have you continue it.

## 2019-11-08 NOTE — ED Notes (Signed)
Per PCP states patient has an acute kidney injury-K+ 6.7

## 2019-11-16 ENCOUNTER — Ambulatory Visit
Admission: RE | Admit: 2019-11-16 | Discharge: 2019-11-16 | Disposition: A | Discharge status: Home / Self Care | Payer: Medicare HMO | Source: Ambulatory Visit | Attending: Internal Medicine | Admitting: Internal Medicine

## 2019-11-16 DIAGNOSIS — R69 Illness, unspecified: Secondary | ICD-10-CM | POA: Diagnosis not present

## 2019-11-16 DIAGNOSIS — Z122 Encounter for screening for malignant neoplasm of respiratory organs: Secondary | ICD-10-CM

## 2019-11-19 ENCOUNTER — Other Ambulatory Visit: Payer: Self-pay | Admitting: Internal Medicine

## 2019-11-20 ENCOUNTER — Other Ambulatory Visit: Payer: Self-pay | Admitting: Internal Medicine

## 2019-11-20 DIAGNOSIS — G4733 Obstructive sleep apnea (adult) (pediatric): Secondary | ICD-10-CM | POA: Diagnosis not present

## 2019-12-20 ENCOUNTER — Other Ambulatory Visit: Payer: Self-pay

## 2019-12-20 DIAGNOSIS — Z1211 Encounter for screening for malignant neoplasm of colon: Secondary | ICD-10-CM

## 2019-12-20 LAB — POC HEMOCCULT BLD/STL (HOME/3-CARD/SCREEN)
Card #2 Fecal Occult Blod, POC: NEGATIVE
Card #3 Fecal Occult Blood, POC: NEGATIVE
Fecal Occult Blood, POC: NEGATIVE

## 2019-12-21 ENCOUNTER — Other Ambulatory Visit: Payer: Self-pay | Admitting: Internal Medicine

## 2019-12-21 DIAGNOSIS — G4733 Obstructive sleep apnea (adult) (pediatric): Secondary | ICD-10-CM | POA: Diagnosis not present

## 2019-12-24 DIAGNOSIS — Z1212 Encounter for screening for malignant neoplasm of rectum: Secondary | ICD-10-CM | POA: Diagnosis not present

## 2020-01-21 DIAGNOSIS — G4733 Obstructive sleep apnea (adult) (pediatric): Secondary | ICD-10-CM | POA: Diagnosis not present

## 2020-01-25 ENCOUNTER — Other Ambulatory Visit: Payer: Self-pay | Admitting: Internal Medicine

## 2020-01-30 DIAGNOSIS — R69 Illness, unspecified: Secondary | ICD-10-CM | POA: Diagnosis not present

## 2020-02-05 NOTE — Progress Notes (Signed)
FOLLOW UP  Assessment and Plan:   Atherosclerosis of aorta Per numerous CTs Control blood pressure, cholesterol, glucose, increase exercise.   Hypertension Continue current medications; consider restarting HCTZ  Monitor blood pressure at home; patient to call if consistently greater than 130/80 Continue DASH diet.   Reminder to go to the ER if any CP, SOB, nausea, dizziness, severe HA, changes vision/speech, left arm numbness and tingling and jaw pain.  Cholesterol LDL at goal; continue statin; triglycerides severely elevated at last visit - discussed at length, dietary information provided Discussed possible fenofibrate should trigs continue to remain severely elevated  Continue low cholesterol diet and exercise.  Check lipid panel.   Other abnormal glucose Recent A1Cs fairly controlled -  Continue diet and exercise.  Perform daily foot/skin check, notify office of any concerning changes.  Defer A1C; check CMP/GFR  Obesity with co morbidities Long discussion about weight loss, diet, and exercise Recommended diet heavy in fruits and veggies and low in animal meats, cheeses, and dairy products, appropriate calorie intake Discussed ideal weight for height Patient will work on continuing portion control, increasing fruits and vegetables, intentional exercise Will follow up in 3 months  COPD/smoking Advised to stop smoking - risks discussed. Declines medications but plans to quit cold [place] soon. Continue Ct lung cancer screening. Consider inhalers if cardiology workup doesn't explain exertional dyspnea.   Vitamin D Def At goal at last visit; continue supplementation to maintain goal of 60-100 Defer Vit D level  Gout Continue allopurinol Diet discussed Check uric acid as needed  LE edema/exertional dyspnea/weight gain High risk cardiac etiology, smoker, morbid obesity, htn, hyperlipidemia Possible underlying CHF/diastolic dysfunction, has never seen cardiology despite  risks Checking BNP today with CBC, CMP, UA Had recent EKG 11/07/2019 which was normal  Anticipate referral to cardiology for ECHO, possible stress test  Did have recent Ct lung 10/2019 - coronary atherosclerosis LAD and left circumflex Decrease sodium intake, compression hose recommended Go to the ER if any chest pain, persistent shortness of breath, nausea, dizziness, syncope  Continue diet and meds as discussed. Further disposition pending results of labs. Discussed med's effects and SE's.   Over 30 minutes of exam, counseling, chart review, and critical decision making was performed.   Future Appointments  Date Time Provider [place]  05/21/2020  9:30 AM [name], MD [facility] None  08/21/2020 11:15 AM Vicie Mutters, PA-C [facility] None  12/03/2020  2:00 PM [name], MD [facility] None    ----------------------------------------------------------------------------------------------------------------------  HPI 69 y.o. male  presents for 3 month follow up on hypertension, cholesterol, glucose management (hx of prediabetes), obesity, COPD and vitamin D deficiency.   he has a diagnosis of COPD but currently continues to smoke 0.75-1 pack a day; discussed risks associated with smoking, patient is thinking about quitting, but wife also smokes and needs to quit at the same time. Declines medications.     He has OSA - wears CPAP, wears "99% of the time" with restorative sleep.   Klonopin 1 mg, takes in the evening 2-3 days per week for sleep. Also takes gabapentin for neuropathic pain and sleep but he reports can't tell if this helps. Has neck/back pain with radicular symptoms, follows with Dr. Rita Ohara, has done injections without benefit, was recommended surgery but patient preferred to defer.   He reports 1 month of lower extremity edema, bil L> R, pitting, reports no significant improvement overnight, he does endorse feeling more fatigued recently, he does  endorse some exertional dyspnea at baseline  but seems worse in the last 6-8 weeks. Denies palpitations, racing heart beat, dizziness, CP, syncope. He has gained 5 lb since the last visit.   He reports very low water intake, drinks 3-4 cupss of coffee and 2 glasses of tea, 1 coke daily, reports minimal water. He reports doesn't eat out, but doesn't particularly watch sodium. Does add salt with salt shaker.   BMI is Body mass index is 35.77 kg/m., he has been working on diet- has been working on cutting back on portions. He does not currently exercise intentionally.  Wt Readings from Last 3 Encounters:  02/06/20 221 lb 9.6 oz (100.5 kg)  11/08/19 216 lb 6.4 oz (98.2 kg)  11/07/19 216 lb 6.4 oz (98.2 kg)   His has BP cuff, hasn't been checking as typically fair control, today their BP is BP: 130/82 He is currently taking ziac 10-6.25 mg, telmisartan 80 mg; appears after last visit HCTZ 25 mg was d/c'd by Dr. Melford Aase.   He does not workout. He denies chest pain, shortness of breath, dizziness.   He is on cholesterol medication (rosuvastatin 40 mg daily) and denies myalgias. His LDL cholesterol is at goal; triglycerides are not at goal. He reports he drinks about 4 ounces of burbon or scotch most days, poorly motivated to cut back. The cholesterol last visit was:   Lab Results  Component Value Date   CHOL 180 11/07/2019   HDL 45 11/07/2019   LDLCALC 89 11/07/2019   TRIG 343 (H) 11/07/2019   CHOLHDL 4.0 11/07/2019    He has been working on diet for glucose management, and denies increased appetite, nausea, paresthesia of the feet, polydipsia, polyuria, visual disturbances, vomiting and weight loss. Last A1C in the office was:  Lab Results  Component Value Date   HGBA1C 5.7 (H) 11/07/2019   Patient is on Vitamin D supplement and near goal at most recent check:    Lab Results  Component Value Date   VD25OH 63 11/07/2019     Patient is on allopurinol for gout and does not report a recent  flare.  Lab Results  Component Value Date   LABURIC 5.7 11/07/2019         Current Medications:  Current Outpatient Medications on File Prior to Visit  Medication Sig  . allopurinol (ZYLOPRIM) 300 MG tablet Take 1 tablet Daily to Prevent Gout (Patient taking differently: Take 300 mg by mouth daily. )  . Ascorbic Acid (VITAMIN C) 1000 MG tablet Take 1,000 mg by mouth daily.  . bisoprolol-hydrochlorothiazide (ZIAC) 10-6.25 MG tablet TAKE 1 TABLET BY MOUTH EVERY DAY FOR BLOOD PRESSURE  . Cholecalciferol (VITAMIN D3) 125 MCG (5000 UT) CAPS Take 10,000 Units by mouth daily.  . clonazePAM (KLONOPIN) 1 MG tablet TAKE 1/2 TO 1 TABLET AT BEDTIME ONLY IF NEEDED & TRY TO LIMIT TO 5 DAYS /WEEK  . gabapentin (NEURONTIN) 600 MG tablet Take 1/2 to 1 tablet 2 to 3 x /Daily as needed for Neuropathy Pain (Patient taking differently: Take 300-600 mg by mouth 3 (three) times daily as needed (neuropathy pain). )  . KRILL OIL PO Take 700 mg by mouth daily.   . Magnesium 400 MG CAPS Take 800 mg by mouth daily.   . meloxicam (MOBIC) 15 MG tablet Take 1 tablet Daily with Food for Pain & Inflammation  . rosuvastatin (CRESTOR) 40 MG tablet Take 1 tablet Daily for Cholesterol  . telmisartan (MICARDIS) 80 MG tablet Take 1 tablet Daily for BP  . zinc gluconate  50 MG tablet Take 50 mg by mouth daily.   No current facility-administered medications on file prior to visit.     Allergies:  No Known Allergies   Medical History:  Past Medical History:  Diagnosis Date  . Colon polyps   . DJD (degenerative joint disease)   . ED (erectile dysfunction)   . Gout   . Hyperlipemia   . Hypertension   . Obstructive sleep apnea    08/2012, cpap setting of 14  . Prostate cancer ([place]) 01/2002   s/p prostatectomy  . Vitamin D deficiency    Family history- Reviewed and unchanged Social history- Reviewed and unchanged   Review of Systems:  Review of Systems  Constitutional: Negative for malaise/fatigue and weight  loss.  HENT: Negative for congestion, hearing loss, nosebleeds, sore throat and tinnitus.   Eyes: Negative for blurred vision and double vision.  Respiratory: Positive for shortness of breath (exertional). Negative for cough and wheezing.   Cardiovascular: Positive for leg swelling (bil x 1 month). Negative for chest pain, palpitations, orthopnea and claudication.  Gastrointestinal: Negative for abdominal pain, blood in stool, constipation, diarrhea, heartburn, melena, nausea and vomiting.  Genitourinary: Negative.   Musculoskeletal: Negative for joint pain and myalgias.  Skin: Negative for rash.  Neurological: Negative for dizziness, tingling, sensory change, weakness and headaches.  Endo/Heme/Allergies: Negative for environmental allergies and polydipsia.  Psychiatric/Behavioral: Negative.   All other systems reviewed and are negative.    Physical Exam: BP 130/82   Pulse 79   Temp (!) 97.5 F (36.4 C)   Ht 5\' 6"  (1.676 m)   Wt 221 lb 9.6 oz (100.5 kg)   SpO2 99%   BMI 35.77 kg/m  Wt Readings from Last 3 Encounters:  02/06/20 221 lb 9.6 oz (100.5 kg)  11/08/19 216 lb 6.4 oz (98.2 kg)  11/07/19 216 lb 6.4 oz (98.2 kg)   General Appearance: Well nourished, in no apparent distress. Eyes: PERRLA, EOMs, conjunctiva no swelling or erythema Sinuses: No Frontal/maxillary tenderness ENT/Mouth: Ext aud canals clear, TMs without erythema, bulging. No erythema, swelling, or exudate on post pharynx.  Tonsils not swollen or erythematous. Hearing normal.  Neck: Supple, thyroid normal.  Respiratory: Respiratory effort normal, BS equal bilaterally no rales, rhonchi, fine wheezing or stridor.  Cardio: RRR with no MRGs. Intact peripheral pulses with bil pitting edema 1-2+, L >R Abdomen: Soft, obese/rounded, non-distended + BS.  Non tender, no guarding, rebound, hernias, masses. Lymphatics: Non tender without lymphadenopathy.  Musculoskeletal: Full ROM, 5/5 strength, Normal gait Skin: Warm, dry  without rashes, lesions, ecchymosis.  Neuro: Cranial nerves intact. No cerebellar symptoms.  Psych: Awake and oriented X 3, normal affect, Insight and Judgment appropriate.    Izora Ribas, NP 5:52 PM [facility]

## 2020-02-06 ENCOUNTER — Other Ambulatory Visit: Payer: Self-pay

## 2020-02-06 ENCOUNTER — Ambulatory Visit (INDEPENDENT_AMBULATORY_CARE_PROVIDER_SITE_OTHER): Payer: Medicare HMO | Admitting: Adult Health

## 2020-02-06 ENCOUNTER — Encounter: Payer: Self-pay | Admitting: Adult Health

## 2020-02-06 VITALS — BP 130/82 | HR 79 | Temp 97.5°F | Ht 66.0 in | Wt 221.6 lb

## 2020-02-06 DIAGNOSIS — I700 Atherosclerosis of aorta: Secondary | ICD-10-CM

## 2020-02-06 DIAGNOSIS — F17200 Nicotine dependence, unspecified, uncomplicated: Secondary | ICD-10-CM

## 2020-02-06 DIAGNOSIS — R7309 Other abnormal glucose: Secondary | ICD-10-CM

## 2020-02-06 DIAGNOSIS — G609 Hereditary and idiopathic neuropathy, unspecified: Secondary | ICD-10-CM

## 2020-02-06 DIAGNOSIS — R69 Illness, unspecified: Secondary | ICD-10-CM | POA: Diagnosis not present

## 2020-02-06 DIAGNOSIS — R0600 Dyspnea, unspecified: Secondary | ICD-10-CM | POA: Diagnosis not present

## 2020-02-06 DIAGNOSIS — E782 Mixed hyperlipidemia: Secondary | ICD-10-CM | POA: Diagnosis not present

## 2020-02-06 DIAGNOSIS — I10 Essential (primary) hypertension: Secondary | ICD-10-CM | POA: Diagnosis not present

## 2020-02-06 DIAGNOSIS — Z79899 Other long term (current) drug therapy: Secondary | ICD-10-CM | POA: Diagnosis not present

## 2020-02-06 DIAGNOSIS — R600 Localized edema: Secondary | ICD-10-CM

## 2020-02-06 DIAGNOSIS — G4733 Obstructive sleep apnea (adult) (pediatric): Secondary | ICD-10-CM

## 2020-02-06 DIAGNOSIS — R609 Edema, unspecified: Secondary | ICD-10-CM

## 2020-02-06 DIAGNOSIS — M1000 Idiopathic gout, unspecified site: Secondary | ICD-10-CM | POA: Diagnosis not present

## 2020-02-06 DIAGNOSIS — E559 Vitamin D deficiency, unspecified: Secondary | ICD-10-CM

## 2020-02-06 DIAGNOSIS — J449 Chronic obstructive pulmonary disease, unspecified: Secondary | ICD-10-CM

## 2020-02-06 DIAGNOSIS — R0609 Other forms of dyspnea: Secondary | ICD-10-CM

## 2020-02-06 NOTE — Patient Instructions (Addendum)
Goals    . DIET - INCREASE WATER INTAKE     Aim for 65+ fluid ounces daily     . Quit Smoking    . Reduce alcohol intake     Limit to max 1-2/day, avoid drinking daily       I am checking various labs for swelling and exertional shortness of breath today If reason for swelling is unclear will recommend evaluation by cardiology, will likely need ECHO heart ultrasound and possibly a stress test  For swelling:   Do the following things EVERYDAY: 1) Weigh yourself in the morning before breakfast or at the same time every day. Write it down and keep it in a log. 2) Take your medicines as prescribed 3) Eat low salt foods--Limit salt (sodium) to 2000 mg per day. Best thing to do is avoid processed foods.   4) Stay as active as you can everyday 5) Limit all fluids for the day to less than 2 liters 6) Put feet up when sitting down, or if on your feet/with feet down for extended periods wear compression socks/hose  Call your doctor if:  Anytime you have any of the following symptoms:  1) 2 pound weight gain in 24 hours or 5 pounds in 1 week  2) shortness of breath, with or without a dry hacking cough  3) swelling in the hands, LEGs, feet or stomach  4) if you have to sleep on extra pillows at night in order to breathe. 5) after laying down at night for 20-30 mins, you wake up short of breath.   These can all be signs of fluid overload.    Edema  Edema is an abnormal buildup of fluids in the body tissues and under the skin. Swelling of the legs, feet, and ankles is a common symptom that becomes more likely as you get older. Swelling is also common in looser tissues, like around the eyes. When the affected area is squeezed, the fluid may move out of that spot and leave a dent for a few moments. This dent is called pitting edema. There are many possible causes of edema. Eating too much salt (sodium) and being on your feet or sitting for a long time can cause edema in your legs, feet, and  ankles. Hot weather may make edema worse. Common causes of edema include:  Heart failure.  Liver or kidney disease.  Weak leg blood vessels.  Cancer.  An injury.  Pregnancy.  Medicines.  Being obese.  Low protein levels in the blood. Edema is usually painless. Your skin may look swollen or shiny. Follow these instructions at home:  Keep the affected body part raised (elevated) above the level of your heart when you are sitting or lying down.  Do not sit still or stand for long periods of time.  Do not wear tight clothing. Do not wear garters on your upper legs.  Exercise your legs to get your circulation going. This helps to move the fluid back into your blood vessels, and it may help the swelling go down.  Wear elastic bandages or support stockings to reduce swelling as told by your health care provider.  Eat a low-salt (low-sodium) diet to reduce fluid as told by your health care provider.  Depending on the cause of your swelling, you may need to limit how much fluid you drink (fluid restriction).  Take over-the-counter and prescription medicines only as told by your health care provider. Contact a health care provider if:  Your  edema does not get better with treatment.  You have heart, liver, or kidney disease and have symptoms of edema.  You have sudden and unexplained weight gain. Get help right away if:  You develop shortness of breath or chest pain.  You cannot breathe when you lie down.  You develop pain, redness, or warmth in the swollen areas.  You have heart, liver, or kidney disease and suddenly get edema.  You have a fever and your symptoms suddenly get worse. Summary  Edema is an abnormal buildup of fluids in the body tissues and under the skin.  Eating too much salt (sodium) and being on your feet or sitting for a long time can cause edema in your legs, feet, and ankles.  Keep the affected body part raised (elevated) above the level of your  heart when you are sitting or lying down. This information is not intended to replace advice given to you by your health care provider. Make sure you discuss any questions you have with your health care provider. Document Revised: 04/04/2019 Document Reviewed: 12/18/2016 Elsevier Patient Education  [place].

## 2020-02-07 ENCOUNTER — Other Ambulatory Visit: Payer: Self-pay | Admitting: Adult Health

## 2020-02-07 DIAGNOSIS — R609 Edema, unspecified: Secondary | ICD-10-CM

## 2020-02-07 DIAGNOSIS — R801 Persistent proteinuria, unspecified: Secondary | ICD-10-CM

## 2020-02-07 LAB — CBC WITH DIFFERENTIAL/PLATELET
Absolute Monocytes: 916 cells/uL (ref 200–950)
Basophils Absolute: 59 cells/uL (ref 0–200)
Basophils Relative: 0.7 %
Eosinophils Absolute: 109 cells/uL (ref 15–500)
Eosinophils Relative: 1.3 %
HCT: 50 % (ref 38.5–50.0)
Hemoglobin: 17.1 g/dL (ref 13.2–17.1)
Lymphs Abs: 2386 cells/uL (ref 850–3900)
MCH: 32.8 pg (ref 27.0–33.0)
MCHC: 34.2 g/dL (ref 32.0–36.0)
MCV: 96 fL (ref 80.0–100.0)
MPV: 11.2 fL (ref 7.5–12.5)
Monocytes Relative: 10.9 %
Neutro Abs: 4931 cells/uL (ref 1500–7800)
Neutrophils Relative %: 58.7 %
Platelets: 172 10*3/uL (ref 140–400)
RBC: 5.21 10*6/uL (ref 4.20–5.80)
RDW: 12.9 % (ref 11.0–15.0)
Total Lymphocyte: 28.4 %
WBC: 8.4 10*3/uL (ref 3.8–10.8)

## 2020-02-07 LAB — COMPLETE METABOLIC PANEL WITH GFR
AG Ratio: 1.7 (calc) (ref 1.0–2.5)
ALT: 22 U/L (ref 9–46)
AST: 21 U/L (ref 10–35)
Albumin: 4.3 g/dL (ref 3.6–5.1)
Alkaline phosphatase (APISO): 69 U/L (ref 35–144)
BUN/Creatinine Ratio: 23 (calc) — ABNORMAL HIGH (ref 6–22)
BUN: 27 mg/dL — ABNORMAL HIGH (ref 7–25)
CO2: 26 mmol/L (ref 20–32)
Calcium: 9.9 mg/dL (ref 8.6–10.3)
Chloride: 104 mmol/L (ref 98–110)
Creat: 1.18 mg/dL (ref 0.70–1.25)
GFR, Est African American: 73 mL/min/{1.73_m2} (ref >=60)
GFR, Est Non African American: 63 mL/min/{1.73_m2} (ref >=60)
Globulin: 2.5 g/dL (calc) (ref 1.9–3.7)
Glucose, Bld: 85 mg/dL (ref 65–99)
Potassium: 4.8 mmol/L (ref 3.5–5.3)
Sodium: 140 mmol/L (ref 135–146)
Total Bilirubin: 0.4 mg/dL (ref 0.2–1.2)
Total Protein: 6.8 g/dL (ref 6.1–8.1)

## 2020-02-07 LAB — LIPID PANEL
Cholesterol: 134 mg/dL (ref <200)
HDL: 40 mg/dL (ref >=40)
LDL Cholesterol (Calc): 65 mg/dL (calc)
Non-HDL Cholesterol (Calc): 94 mg/dL (calc) (ref <130)
Total CHOL/HDL Ratio: 3.4 (calc) (ref <5.0)
Triglycerides: 232 mg/dL — ABNORMAL HIGH (ref <150)

## 2020-02-07 LAB — URINALYSIS, ROUTINE W REFLEX MICROSCOPIC
Bacteria, UA: NONE SEEN /HPF
Bilirubin Urine: NEGATIVE
Glucose, UA: NEGATIVE
Hgb urine dipstick: NEGATIVE
Hyaline Cast: NONE SEEN /LPF
Ketones, ur: NEGATIVE
Leukocytes,Ua: NEGATIVE
Nitrite: NEGATIVE
RBC / HPF: NONE SEEN /HPF (ref 0–2)
Specific Gravity, Urine: 1.023 (ref 1.001–1.03)
Squamous Epithelial / HPF: NONE SEEN /HPF (ref <=5)
WBC, UA: NONE SEEN /HPF (ref 0–5)
pH: <=5 (ref 5.0–8.0)

## 2020-02-07 LAB — TSH: TSH: 1.14 mIU/L (ref 0.40–4.50)

## 2020-02-07 LAB — MAGNESIUM: Magnesium: 2.1 mg/dL (ref 1.5–2.5)

## 2020-02-14 ENCOUNTER — Ambulatory Visit (INDEPENDENT_AMBULATORY_CARE_PROVIDER_SITE_OTHER): Payer: Medicare HMO | Admitting: *Deleted

## 2020-02-14 ENCOUNTER — Other Ambulatory Visit: Payer: Self-pay | Admitting: Adult Health

## 2020-02-14 ENCOUNTER — Other Ambulatory Visit: Payer: Self-pay

## 2020-02-14 DIAGNOSIS — R801 Persistent proteinuria, unspecified: Secondary | ICD-10-CM | POA: Diagnosis not present

## 2020-02-14 DIAGNOSIS — R609 Edema, unspecified: Secondary | ICD-10-CM

## 2020-02-14 NOTE — Progress Notes (Signed)
Patient is here for a NV and additional lab studies, due to protein in his urine. Patient states he continues to have edema in lower extremities and that he has increased his water intake.

## 2020-02-18 DIAGNOSIS — R609 Edema, unspecified: Secondary | ICD-10-CM | POA: Diagnosis not present

## 2020-02-18 DIAGNOSIS — R801 Persistent proteinuria, unspecified: Secondary | ICD-10-CM | POA: Diagnosis not present

## 2020-02-19 ENCOUNTER — Other Ambulatory Visit: Payer: Self-pay | Admitting: Adult Health

## 2020-02-19 DIAGNOSIS — G4733 Obstructive sleep apnea (adult) (pediatric): Secondary | ICD-10-CM | POA: Diagnosis not present

## 2020-02-19 DIAGNOSIS — R0600 Dyspnea, unspecified: Secondary | ICD-10-CM

## 2020-02-19 DIAGNOSIS — R801 Persistent proteinuria, unspecified: Secondary | ICD-10-CM

## 2020-02-19 DIAGNOSIS — R0609 Other forms of dyspnea: Secondary | ICD-10-CM

## 2020-02-19 DIAGNOSIS — R5383 Other fatigue: Secondary | ICD-10-CM

## 2020-02-19 DIAGNOSIS — R609 Edema, unspecified: Secondary | ICD-10-CM

## 2020-02-19 LAB — PROTEIN ELECTROPHORESIS, SERUM, WITH REFLEX
Albumin ELP: 3.6 g/dL — ABNORMAL LOW (ref 3.8–4.8)
Alpha 1: 0.3 g/dL (ref 0.2–0.3)
Alpha 2: 0.7 g/dL (ref 0.5–0.9)
Beta 2: 0.4 g/dL (ref 0.2–0.5)
Beta Globulin: 0.5 g/dL (ref 0.4–0.6)
Gamma Globulin: 0.7 g/dL — ABNORMAL LOW (ref 0.8–1.7)
Total Protein: 6.1 g/dL (ref 6.1–8.1)

## 2020-02-19 LAB — IFE INTERPRETATION: Immunofix Electr Int: NOT DETECTED

## 2020-02-19 LAB — PROTEIN, URINE, 24 HOUR: Protein, 24H Urine: 774 mg/24 h — ABNORMAL HIGH (ref 0–149)

## 2020-02-21 ENCOUNTER — Ambulatory Visit
Admission: RE | Admit: 2020-02-21 | Discharge: 2020-02-21 | Disposition: A | Discharge status: Home / Self Care | Payer: Medicare HMO | Source: Ambulatory Visit | Attending: Adult Health | Admitting: Adult Health

## 2020-02-21 DIAGNOSIS — N281 Cyst of kidney, acquired: Secondary | ICD-10-CM | POA: Diagnosis not present

## 2020-02-21 DIAGNOSIS — R801 Persistent proteinuria, unspecified: Secondary | ICD-10-CM

## 2020-02-22 ENCOUNTER — Other Ambulatory Visit: Payer: Self-pay

## 2020-02-22 ENCOUNTER — Ambulatory Visit (INDEPENDENT_AMBULATORY_CARE_PROVIDER_SITE_OTHER): Payer: Medicare HMO

## 2020-02-22 ENCOUNTER — Encounter: Payer: Self-pay | Admitting: Adult Health

## 2020-02-22 ENCOUNTER — Other Ambulatory Visit: Payer: Self-pay | Admitting: Adult Health

## 2020-02-22 DIAGNOSIS — R801 Persistent proteinuria, unspecified: Secondary | ICD-10-CM | POA: Diagnosis not present

## 2020-02-22 NOTE — Progress Notes (Signed)
Patient presents to the office for a nurse visit to have his urine checked. No questions or concerns. Vitals taken and recorded.

## 2020-02-25 ENCOUNTER — Ambulatory Visit: Payer: Medicare HMO

## 2020-02-25 LAB — PROTEIN,TOTAL AND PROTEIN ELECTROPHORESIS, RANDOM URINE(REFL)
Albumin: 79 %
Alpha-1-Globulin, U: 2 %
Alpha-2-Globulin, U: 5 %
Beta Globulin, U: 10 %
Creatinine, Urine: 60 mg/dL (ref 20–320)
Gamma Globulin, U: 5 %
Protein/Creat Ratio: 217 mg/g creat — ABNORMAL HIGH (ref 22–128)
Protein/Creatinine Ratio: 0.217 mg/mg creat — ABNORMAL HIGH (ref 0.022–0.12)
Total Protein, Urine: 13 mg/dL (ref 5–25)

## 2020-02-27 ENCOUNTER — Other Ambulatory Visit: Payer: Self-pay

## 2020-02-27 ENCOUNTER — Other Ambulatory Visit: Payer: Medicare HMO

## 2020-03-15 ENCOUNTER — Other Ambulatory Visit: Payer: Self-pay | Admitting: Internal Medicine

## 2020-03-17 ENCOUNTER — Other Ambulatory Visit: Payer: Self-pay | Admitting: Internal Medicine

## 2020-03-17 ENCOUNTER — Telehealth: Payer: Self-pay | Admitting: *Deleted

## 2020-03-17 NOTE — Telephone Encounter (Signed)
Patient was advised a letter of clearance, in regard to [facility] and Gabapentin, has been faxed to [facility], at his request.  Patient will pick up a copy of the letter for his records.

## 2020-03-18 ENCOUNTER — Telehealth: Payer: Self-pay | Admitting: *Deleted

## 2020-03-18 NOTE — Telephone Encounter (Signed)
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 Referral to Dr Tish Frederickson placed.

## 2020-03-19 ENCOUNTER — Ambulatory Visit: Payer: Medicare HMO | Admitting: Cardiovascular Disease

## 2020-03-19 ENCOUNTER — Other Ambulatory Visit: Payer: Self-pay

## 2020-03-19 ENCOUNTER — Encounter: Payer: Self-pay | Admitting: Internal Medicine

## 2020-03-19 ENCOUNTER — Encounter: Payer: Self-pay | Admitting: Cardiovascular Disease

## 2020-03-19 VITALS — BP 152/88 | HR 62 | Ht 67.0 in | Wt 215.4 lb

## 2020-03-19 DIAGNOSIS — I10 Essential (primary) hypertension: Secondary | ICD-10-CM | POA: Diagnosis not present

## 2020-03-19 DIAGNOSIS — G4733 Obstructive sleep apnea (adult) (pediatric): Secondary | ICD-10-CM

## 2020-03-19 DIAGNOSIS — J449 Chronic obstructive pulmonary disease, unspecified: Secondary | ICD-10-CM

## 2020-03-19 DIAGNOSIS — R0600 Dyspnea, unspecified: Secondary | ICD-10-CM | POA: Diagnosis not present

## 2020-03-19 DIAGNOSIS — E782 Mixed hyperlipidemia: Secondary | ICD-10-CM | POA: Diagnosis not present

## 2020-03-19 DIAGNOSIS — I2510 Atherosclerotic heart disease of native coronary artery without angina pectoris: Secondary | ICD-10-CM

## 2020-03-19 DIAGNOSIS — R0609 Other forms of dyspnea: Secondary | ICD-10-CM | POA: Insufficient documentation

## 2020-03-19 DIAGNOSIS — R69 Illness, unspecified: Secondary | ICD-10-CM | POA: Diagnosis not present

## 2020-03-19 DIAGNOSIS — F17200 Nicotine dependence, unspecified, uncomplicated: Secondary | ICD-10-CM

## 2020-03-19 NOTE — Assessment & Plan Note (Signed)
History of obstructive sleep apnea on CPAP. 

## 2020-03-19 NOTE — Assessment & Plan Note (Signed)
History of hyperlipidemia on high-dose atorvastatin performed 02/06/2020 revealing total cholesterol 134, LDL 65 and HDL 40.

## 2020-03-19 NOTE — Assessment & Plan Note (Signed)
Stephen Holland has noticed progressive dyspnea on exertion over the last 3 months.  Is also noted some peripheral edema.  We talked about the importance of salt restriction.  Go to get a 2D echo to further evaluate

## 2020-03-19 NOTE — Assessment & Plan Note (Signed)
Chest CT done for lung cancer screening 11/16/2019 showed calcification in the LAD and circumflex.  I am going to get a coronary calcium score to further evaluate.

## 2020-03-19 NOTE — Assessment & Plan Note (Signed)
History of 50-75-pack-year tobacco abuse currently smoking 1-1/2 packs a day.  He does have COPD

## 2020-03-19 NOTE — Patient Instructions (Signed)
Medication Instructions:  Your physician recommends that you continue on your current medications as directed. Please refer to the Current Medication list given to you today.  *If you need a refill on your cardiac medications before your next appointment, please call your pharmacy*  Testing/Procedures: Your physician has requested that you have an echocardiogram. Echocardiography is a painless test that uses sound waves to create images of your heart. It provides your doctor with information about the size and shape of your heart and how well your heart's chambers and valves are working. This procedure takes approximately one hour. There are no restrictions for this procedure. This will be done at our [facility] location:  [address]  CT coronary calcium score. This test is done at [address]. This is $150 out of pocket.   Coronary CalciumScan A coronary calcium scan is an imaging test used to look for deposits of calcium and other fatty materials (plaques) in the inner lining of the blood vessels of the heart (coronary arteries). These deposits of calcium and plaques can partly clog and narrow the coronary arteries without producing any symptoms or warning signs. This puts a person at risk for a heart attack. This test can detect these deposits before symptoms develop. Tell a health care provider about:  Any allergies you have.  All medicines you are taking, including vitamins, herbs, eye drops, creams, and over-the-counter medicines.  Any problems you or family members have had with anesthetic medicines.  Any blood disorders you have.  Any surgeries you have had.  Any medical conditions you have.  Whether you are pregnant or may be pregnant. What are the risks? Generally, this is a safe procedure. However, problems may occur, including:  Harm to a pregnant woman and her unborn baby. This test involves the use of radiation. Radiation  exposure can be dangerous to a pregnant woman and her unborn baby. If you are pregnant, you generally should not have this procedure done.  Slight increase in the risk of cancer. This is because of the radiation involved in the test. What happens before the procedure? No preparation is needed for this procedure. What happens during the procedure?  You will undress and remove any jewelry around your neck or chest.  You will put on a hospital gown.  Sticky electrodes will be placed on your chest. The electrodes will be connected to an electrocardiogram (ECG) machine to record a tracing of the electrical activity of your heart.  A CT scanner will take pictures of your heart. During this time, you will be asked to lie still and hold your breath for 2-3 seconds while a picture of your heart is being taken. The procedure may vary among health care providers and hospitals. What happens after the procedure?  You can get dressed.  You can return to your normal activities.  It is up to you to get the results of your test. Ask your health care provider, or the department that is doing the test, when your results will be ready. Summary  A coronary calcium scan is an imaging test used to look for deposits of calcium and other fatty materials (plaques) in the inner lining of the blood vessels of the heart (coronary arteries).  Generally, this is a safe procedure. Tell your health care provider if you are pregnant or may be pregnant.  No preparation is needed for this procedure.  A CT scanner will take pictures of your heart.  You can return to your normal activities after the scan is done. This information is not intended to replace advice given to you by your health care provider. Make sure you discuss any questions you have with your health care provider. Document Released: 05/13/2008 Document Revised: 10/04/2016 Document Reviewed: 10/04/2016 Elsevier Interactive Patient Education  2017  [place]: At [facility], you and your health needs are our priority.  As part of our continuing mission to provide you with exceptional heart care, we have created designated Provider Care Teams.  These Care Teams include your primary Cardiologist (physician) and Advanced Practice Providers (APPs -  Physician Assistants and Nurse Practitioners) who all work together to provide you with the care you need, when you need it.  We recommend signing up for the patient portal called "MyChart".  Sign up information is provided on this After Visit Summary.  MyChart is used to connect with patients for Virtual Visits (Telemedicine).  Patients are able to view lab/test results, encounter notes, upcoming appointments, etc.  Non-urgent messages can be sent to your provider as well.   To learn more about what you can do with MyChart, go to [email or web address].    Your next appointment:   4-6 week(s)  The format for your next appointment:   In Person  Provider:   Quay Burow, MD

## 2020-03-19 NOTE — Progress Notes (Signed)
03/19/2020 Stephen Holland   07-11-1951  IK:[id number]  Primary Physician Stephen Pinto, MD Primary Cardiologist: Stephen Harp MD Stephen Holland, [place], [place]  HPI:  Stephen Holland is a 69 y.o. morbidly overweight married Caucasian male father of 2, grandfather of 2 grandchildren referred by Dr. Melford Holland because of progressive dyspnea and lower extremity edema.  His cardiac risk factor profile is notable for 50-75-pack-year tobacco abuse currently smoking a pack and a half a day.  He also drinks 2-3 hard drinks a night (bourbon).  He has treated hypertension hyperlipidemia.  There is no family history for heart disease.  Is never had a heart attack or stroke.  Just denies chest pain but has had progressive dyspnea over the last 3 months with lower extremity edema.2   Current Meds  Medication Sig  . allopurinol (ZYLOPRIM) 300 MG tablet Take 1 tablet Daily to Prevent Gout (Patient taking differently: Take 300 mg by mouth daily. )  . Ascorbic Acid (VITAMIN C) 1000 MG tablet Take 1,000 mg by mouth daily.  . bisoprolol-hydrochlorothiazide (ZIAC) 10-6.25 MG tablet TAKE 1 TABLET BY MOUTH EVERY DAY FOR BLOOD PRESSURE  . Cholecalciferol (VITAMIN D3) 125 MCG (5000 UT) CAPS Take 10,000 Units by mouth daily.  . clonazePAM (KLONOPIN) 1 MG tablet TAKE 1/2 TO 1 TABLET AT BEDTIME ONLY IF NEEDED & TRY TO LIMIT TO 5 DAYS /WEEK  . gabapentin (NEURONTIN) 600 MG tablet Take 1/2 to 1 tablet 2 to 3 x /Daily as needed for Neuropathy Pain  . KRILL OIL PO Take 700 mg by mouth daily.   . Magnesium 400 MG CAPS Take 800 mg by mouth daily.   . meloxicam (MOBIC) 15 MG tablet Take 1 tablet Daily with Food for Pain & Inflammation  . rosuvastatin (CRESTOR) 40 MG tablet Take 1 tablet Daily for Cholesterol  . telmisartan (MICARDIS) 80 MG tablet Take 1 tablet Daily for BP  . zinc gluconate 50 MG tablet Take 50 mg by mouth daily.     No Known Allergies  Social History   Socioeconomic History  . Marital status: Married    Spouse name: Not on file  . Number of children: Not on file  . Years of education: Not on file  . Highest education level: Not on file  Occupational History  . Not on file  Tobacco Use  . Smoking status: Current Every Day Smoker    Packs/day: 1.00    Years: 45.00    Pack years: 45.00    Types: Cigarettes  . Smokeless tobacco: Never Used  Substance and Sexual Activity  . Alcohol use: Yes    Alcohol/week: 15.0 standard drinks    Types: 15 Standard drinks or equivalent per week    Comment: 2-3 mixed drinks per day  . Drug use: No  . Sexual activity: Not on file  Other Topics Concern  . Not on file  Social History Narrative   Lives home with wife. Retired.  Education HS.  Children 2,  Caffein 4 cups daily and one coke.   Social Determinants of Health   Financial Resource Strain:   . Difficulty of Paying Living Expenses:   Food Insecurity:   . Worried About Charity fundraiser in the Last Year:   . Arboriculturist in the Last Year:   Transportation Needs:   . Film/video editor (Medical):   Marland Kitchen Lack of Transportation (Non-Medical):   Physical Activity:   . Days of Exercise per Week:   .  Minutes of Exercise per Session:   Stress:   . Feeling of Stress :   Social Connections:   . Frequency of Communication with Friends and Family:   . Frequency of Social Gatherings with Friends and Family:   . Attends Religious Services:   . Active Member of Clubs or Organizations:   . Attends Archivist Meetings:   Marland Kitchen Marital Status:   Intimate Partner Violence:   . Fear of Current or Ex-Partner:   . Emotionally Abused:   Marland Kitchen Physically Abused:   . Sexually Abused:      Review of Systems: General: negative for chills, fever, night sweats or weight changes.  Cardiovascular: negative for chest pain, dyspnea on exertion, edema, orthopnea, palpitations, paroxysmal nocturnal dyspnea or shortness of breath Dermatological: negative for rash Respiratory: negative for cough or  wheezing Urologic: negative for hematuria Abdominal: negative for nausea, vomiting, diarrhea, bright red blood per rectum, melena, or hematemesis Neurologic: negative for visual changes, syncope, or dizziness All other systems reviewed and are otherwise negative except as noted above.    Blood pressure (!) 152/88, pulse 62, height 5\' 7"  (1.702 m), weight 215 lb 6.4 oz (97.7 kg), SpO2 95 %.  General appearance: alert and no distress Neck: no adenopathy, no carotid bruit, no JVD, supple, symmetrical, trachea midline and thyroid not enlarged, symmetric, no tenderness/mass/nodules Lungs: clear to auscultation bilaterally Heart: regular rate and rhythm, S1, S2 normal, no murmur, click, rub or gallop Extremities: extremities normal, atraumatic, no cyanosis or edema Pulses: 2+ and symmetric Skin: Skin color, texture, turgor normal. No rashes or lesions Neurologic: Alert and oriented X 3, normal strength and tone. Normal symmetric reflexes. Normal coordination and gait  EKG 68 without ST or T wave changes.  I personally reviewed this EKG.  ASSESSMENT AND PLAN:   Essential hypertension History of essential potential blood pressure measured today 152/88.  He is on bisoprolol, hydrochlorothiazide and Micardis.  Hyperlipidemia, mixed History of hyperlipidemia on high-dose atorvastatin performed 02/06/2020 revealing total cholesterol 134, LDL 65 and HDL 40.  OSA and COPD overlap syndrome (HCC) History of obstructive sleep apnea on CPAP  Tobacco use disorder History of 50-75-pack-year tobacco abuse currently smoking 1-1/2 packs a day.  He does have COPD  Dyspnea on exertion [name] has noticed progressive dyspnea on exertion over the last 3 months.  Is also noted some peripheral edema.  We talked about the importance of salt restriction.  Go to get a 2D echo to further evaluate  Coronary artery calcification seen on CT scan Chest CT done for lung cancer screening 11/16/2019 showed  calcification in the LAD and circumflex.  I am going to get a coronary calcium score to further evaluate.      Stephen Harp MD FACP,FACC,FAHA, [facility] 03/19/2020 4:05 PM

## 2020-03-19 NOTE — Assessment & Plan Note (Signed)
History of essential potential blood pressure measured today 152/88.  He is on bisoprolol, hydrochlorothiazide and Micardis.

## 2020-03-21 DIAGNOSIS — G4733 Obstructive sleep apnea (adult) (pediatric): Secondary | ICD-10-CM | POA: Diagnosis not present

## 2020-03-31 ENCOUNTER — Encounter: Payer: Self-pay | Admitting: Diagnostic Neuroimaging

## 2020-03-31 ENCOUNTER — Other Ambulatory Visit: Payer: Self-pay

## 2020-03-31 ENCOUNTER — Ambulatory Visit: Payer: Medicare HMO | Admitting: Diagnostic Neuroimaging

## 2020-03-31 VITALS — BP 176/87 | HR 61 | Temp 97.0°F | Ht 67.0 in | Wt 217.0 lb

## 2020-03-31 DIAGNOSIS — G629 Polyneuropathy, unspecified: Secondary | ICD-10-CM | POA: Diagnosis not present

## 2020-03-31 NOTE — Patient Instructions (Signed)
NUMBNESS IN FINGERS / TOES - no significant pain; recommend to taper off gabapentin (currently 600mg  three times a day; no benefit) - check neuropathy labs  - neuropathy is mild, and does not appear to affect his ability to drive

## 2020-03-31 NOTE — Progress Notes (Signed)
[facility]  PATIENT: Stephen Holland DOB: 08-18-1951  REFERRING CLINICIAN: Essie Christine HISTORY FROM: patient  REASON FOR VISIT: new consult    HISTORICAL  CHIEF COMPLAINT:  Chief Complaint  Patient presents with  . 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  He was empirically started on gabapentin titrated up to 600 mg 3 times a day.  Patient does not have any significant pain and gabapentin is not affected his symptoms.  He does not feel sleepy on gabapentin.  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  Patient returns to see if neuropathy affects his ability to drive.  Patient does report numbness in his hands and feet but he is able to feel the pedals and maintain good control of his vehicle.  No significant pain.  Patient drinks 2-3 alcohol drinks per night.  He smokes 1.5 packs of cigarettes per day.  He is following up with PCP and cardiology regarding dyspnea and lower extremity edema.  PRIOR HPI 12/25/18:  69 year old male here for evaluation of numbness and tingling.  Patient reports at least 8 to 10 years of numbness and tingling in his hands and feet (starting approximately 2010).  He also has neck pain, low back pain, possible right carpal tunnel syndrome (diagnosed clinically, but never had EMG testing).  He has been to orthopedic clinic in the past, had epidural steroid injection in the low back a few years ago, without relief.  He has been to neurosurgery in the past for his cervical and lumbar degenerative spine disease, and was offered surgery but no guarantee of improvement, and therefore he declined to proceed.  Since that time  symptoms have continued to progress.   REVIEW OF SYSTEMS: Full 14 system review of systems performed and negative with exception of: As per HPI.  ALLERGIES: No Known Allergies  HOME MEDICATIONS: Outpatient Medications Prior to Visit  Medication Sig Dispense Refill  . allopurinol (ZYLOPRIM) 300 MG tablet Take 1 tablet Daily to Prevent Gout (Patient taking differently: Take 300 mg by mouth daily. ) 90 tablet 3  . Ascorbic Acid (VITAMIN C) 1000 MG tablet Take 1,000 mg by mouth daily.    . bisoprolol-hydrochlorothiazide (ZIAC) 10-6.25 MG tablet TAKE 1 TABLET BY MOUTH EVERY DAY FOR BLOOD PRESSURE 90 tablet 1  . Cholecalciferol (VITAMIN D3) 125 MCG (5000 UT) CAPS Take 10,000 Units by mouth daily.    . clonazePAM (KLONOPIN) 1 MG tablet TAKE 1/2 TO 1 TABLET AT BEDTIME ONLY IF NEEDED & TRY TO LIMIT TO 5 DAYS /WEEK 90 tablet 0  . gabapentin (NEURONTIN) 600 MG tablet Take 1/2 to 1 tablet 2 to 3 x /Daily as needed for Neuropathy Pain 270 tablet 1  . KRILL OIL PO Take 700 mg by mouth daily.     . Magnesium 400 MG CAPS Take 800 mg by mouth daily.     . meloxicam (MOBIC) 15 MG tablet Take 1 tablet Daily with Food for Pain & Inflammation 90 tablet 3  . rosuvastatin (CRESTOR) 40 MG tablet Take 1 tablet Daily for Cholesterol 90 tablet 3  . telmisartan (MICARDIS) 80 MG tablet Take 1 tablet Daily for BP  90 tablet 3  . zinc gluconate 50 MG tablet Take 50 mg by mouth daily.     No facility-administered medications prior to visit.    PAST MEDICAL HISTORY: Past Medical History:  Diagnosis Date  . Colon polyps   . DJD (degenerative joint disease)   . ED (erectile dysfunction)   . Gout   . Hyperlipemia   . Hypertension   . Obstructive sleep apnea    08/2012, cpap setting of 14  . Prostate cancer ([place]) 01/2002   s/p prostatectomy  . Vitamin D deficiency     PAST SURGICAL HISTORY: Past Surgical History:  Procedure Laterality Date  . COLONOSCOPY WITH PROPOFOL N/A 09/06/2016   Procedure: COLONOSCOPY  WITH PROPOFOL;  Surgeon: Garlan Fair, MD;  Location: [facility];  Service: Endoscopy;  Laterality: N/A;  . colonscopy     x 3 with polyp one time  . PROSTATECTOMY  2003  . SKIN CANCER EXCISION      FAMILY HISTORY: Family History  Problem Relation Age of Onset  . Hypertension Mother   . Pulmonary fibrosis Mother   . Stroke Father   . Heart attack Maternal Grandmother   . Stroke Maternal Grandfather   . Stroke Paternal Grandmother   . Colon cancer Paternal Grandfather     SOCIAL HISTORY: Social History   Socioeconomic History  . Marital status: Married    Spouse name: [name]  . Number of children: 2  . Years of education: 18  . Highest education level: Not on file  Occupational History    Comment: truck driver  Tobacco Use  . Smoking status: Current Every Day Smoker    Packs/day: 1.00    Years: 45.00    Pack years: 45.00    Types: Cigarettes  . Smokeless tobacco: Never Used  Substance and Sexual Activity  . Alcohol use: Yes    Alcohol/week: 15.0 standard drinks    Types: 15 Standard drinks or equivalent per week    Comment: 03/31/20  2-3 mixed drinks per day  . Drug use: No  . Sexual activity: Not on file  Other Topics Concern  . Not on file  Social History Narrative   Lives home with wife. Retired.  Education HS.  Children 2     Caffeine 4 cups daily and one coke.   Social Determinants of Health   Financial Resource Strain:   . Difficulty of Paying Living Expenses:   Food Insecurity:   . Worried About Charity fundraiser in the Last Year:   . Arboriculturist in the Last Year:   Transportation Needs:   . Film/video editor (Medical):   Marland Kitchen Lack of Transportation (Non-Medical):   Physical Activity:   . Days of Exercise per Week:   . Minutes of Exercise per Session:   Stress:   . Feeling of Stress :   Social Connections:   . Frequency of Communication with Friends and Family:   . Frequency of Social Gatherings with Friends and Family:   .  Attends Religious Services:   . Active Member of Clubs or Organizations:   . Attends Archivist Meetings:   Marland Kitchen Marital Status:   Intimate Partner Violence:   . Fear of Current or Ex-Partner:   . Emotionally Abused:   Marland Kitchen Physically Abused:   . Sexually Abused:      PHYSICAL EXAM  GENERAL EXAM/CONSTITUTIONAL: Vitals:  Vitals:   03/31/20 0922  BP: (!) 176/87  Pulse: 61  Temp: Marland Kitchen)  97 F (36.1 C)  Weight: 217 lb (98.4 kg)  Height: 5' 7" (1.702 m)   Body mass index is 33.99 kg/m. Wt Readings from Last 3 Encounters:  03/31/20 217 lb (98.4 kg)  03/19/20 215 lb 6.4 oz (97.7 kg)  02/22/20 222 lb 12.8 oz (101.1 kg)    Patient is in no distress; well developed, nourished and groomed; neck is supple  CARDIOVASCULAR:  Examination of carotid arteries is normal; no carotid bruits  Regular rate and rhythm, no murmurs  Examination of peripheral vascular system by observation and palpation is normal  EYES:  Ophthalmoscopic exam of optic discs and posterior segments is normal; no papilledema or hemorrhages No exam data present  MUSCULOSKELETAL:  Gait, strength, tone, movements noted in Neurologic exam below  NEUROLOGIC: MENTAL STATUS:  No flowsheet data found.  awake, alert, oriented to person, place and time  recent and remote memory intact  normal attention and concentration  language fluent, comprehension intact, naming intact  fund of knowledge appropriate  CRANIAL NERVE:   2nd - no papilledema on fundoscopic exam  2nd, 3rd, 4th, 6th - pupils equal and reactive to light, visual fields full to confrontation, extraocular muscles intact, no nystagmus  5th - facial sensation symmetric  7th - facial strength symmetric  8th - hearing intact  9th - palate elevates symmetrically, uvula midline  11th - shoulder shrug symmetric  12th - tongue protrusion midline  MOTOR:   normal bulk and tone, full strength in the BUE, BLE  EXCEPT RIGHT HAND APB  WEAKNESS (SUBTLE ATROPHY)  SENSORY:  normal and symmetric to light touch, temperature, vibration; DECR PP IN FINGERS AND FEET  COORDINATION:   finger-nose-finger, fine finger movements normal  REFLEXES:   deep tendon reflexes present and symmetric; BUE 2; KNEES TRACE; ANKLES 0  GAIT/STATION:   narrow based gait     DIAGNOSTIC DATA (LABS, IMAGING, TESTING) - I reviewed patient records, labs, notes, testing and imaging myself where available.  Lab Results  Component Value Date   WBC 8.4 02/06/2020   HGB 17.1 02/06/2020   HCT 50.0 02/06/2020   MCV 96.0 02/06/2020   PLT 172 02/06/2020      Component Value Date/Time   NA 140 02/06/2020 1027   K 4.8 02/06/2020 1027   CL 104 02/06/2020 1027   CO2 26 02/06/2020 1027   GLUCOSE 85 02/06/2020 1027   BUN 27 (H) 02/06/2020 1027   CREATININE 1.18 02/06/2020 1027   CALCIUM 9.9 02/06/2020 1027   PROT 6.1 02/14/2020 1017   ALBUMIN 4.1 06/13/2017 1004   AST 21 02/06/2020 1027   ALT 22 02/06/2020 1027   ALKPHOS 58 06/13/2017 1004   BILITOT 0.4 02/06/2020 1027   GFRNONAA 63 02/06/2020 1027   GFRAA 73 02/06/2020 1027   Lab Results  Component Value Date   CHOL 134 02/06/2020   HDL 40 02/06/2020   LDLCALC 65 02/06/2020   TRIG 232 (H) 02/06/2020   CHOLHDL 3.4 02/06/2020   Lab Results  Component Value Date   HGBA1C 5.7 (H) 11/07/2019   Lab Results  Component Value Date   RDEYCXKG81 856 07/23/2015   Lab Results  Component Value Date   TSH 1.14 02/06/2020    07/05/14 MRI cervical spine [I reviewed images myself and agree with interpretation. -VRP]  1. At C3-4 there is a mild broad-based disc bulge with moderate bilateral facet arthropathy and uncovertebral degenerative changes resulting in severe bilateral foraminal stenosis, right greater than left. 2. At C4-5 there is  a small central disc protrusion with mild-moderate right facet arthropathy and uncovertebral degenerative changes resulting in severe right foraminal  stenosis.  3. At C5-6 there is a mild broad-based disc bulge with left uncovertebral degenerative changes and severe left foraminal stenosis.  4. At C6-7 is a mild broad-based disc bulge with bilateral uncovertebral degenerative changes resulting in severe bilateral foraminal stenosis.  10/19/13 MRI lumbar spine [I reviewed images myself and agree with interpretation. -VRP] 1. At L3-L4 there is a mild broad-based disc bulge with flattening of the ventral thecal sac with a right far lateral disc component which is in close proximity to the right extra foraminal L3 nerve root. There is moderate bilateral facet arthropathy. 2. At L4-L5 there is a mild broad-based disc bulge with a right far lateral disc component in close proximity to the right extra foraminal L4 nerve root. Mild bilateral facet arthropathy. Mild right foraminal stenosis. 3. Mild broad-based disc bulges at L2-3 and L5-S1. 4. Bilateral SI joint osteoarthritic change.  01/17/19 MRI cervical spine - Multi-level degenerative changes and foraminal stenosis from C3-4 to C6-7.  - No change from MRI on 07/05/14.   01/17/19 MRI lumbar spine - At L2-3: disc bulging with mild right and moderate left foraminal stenosis  - At L3-4: disc bulging with mild spinal stenosis, mild right and moderate left foraminal stenosis  - At L4-5: disc bulging with moderate biforaminal stenosis  - Compared to MRI in 2014, mild progression of degenerative changes.    ASSESSMENT AND PLAN   69 y.o. year old male here with numbness and tingling in hands and feet, neck pain, low back pain, since 2010.  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  May also have superimposed right carpal tunnel syndrome.  May also have peripheral neuropathy.   Dx:  1. Neuropathy      PLAN:  NUMBNESS IN FINGERS / TOES (? mild peripheral neuropathy; ? etoh neuropathy vs metabolic) - no significant pain; recommend to taper off gabapentin (currently 615m  three times a day; no benefit) - check neuropathy labs  - neuropathy is mild, and does not appear to affect his ability to drive  Orders Placed This Encounter  Procedures  . Vitamin B12  . ANA w/Reflex  . SSA, SSB  . ESR  . CRP  . HIV  . RPR  . Hepatitis C antibody  . Hepatitis B core antibody, total  . Hepatitis B surface antigen  . Hepatitis B surface antibody, qualitative  . Vitamin B1  . Vitamin B6  . ANCA  . Heavy metals   Return for return to PCP, pending if symptoms worsen or fail to improve.    VPenni Bombard MD 56/12/8364 129:47AM Certified in Neurology, Neurophysiology and Neuroimaging  [facility] [address] [place] [phone]

## 2020-04-09 ENCOUNTER — Ambulatory Visit
Admission: RE | Admit: 2020-04-09 | Discharge: 2020-04-09 | Disposition: A | Discharge status: Home / Self Care | Payer: Self-pay | Source: Ambulatory Visit | Attending: Cardiovascular Disease | Admitting: Cardiovascular Disease

## 2020-04-09 ENCOUNTER — Other Ambulatory Visit: Payer: Self-pay

## 2020-04-09 ENCOUNTER — Ambulatory Visit (HOSPITAL_COMMUNITY): Payer: Medicare HMO | Attending: Internal Medicine

## 2020-04-09 DIAGNOSIS — E782 Mixed hyperlipidemia: Secondary | ICD-10-CM

## 2020-04-09 DIAGNOSIS — R0600 Dyspnea, unspecified: Secondary | ICD-10-CM | POA: Diagnosis not present

## 2020-04-09 DIAGNOSIS — J439 Emphysema, unspecified: Secondary | ICD-10-CM | POA: Diagnosis not present

## 2020-04-10 DIAGNOSIS — L570 Actinic keratosis: Secondary | ICD-10-CM | POA: Diagnosis not present

## 2020-04-10 DIAGNOSIS — D225 Melanocytic nevi of trunk: Secondary | ICD-10-CM | POA: Diagnosis not present

## 2020-04-10 DIAGNOSIS — L578 Other skin changes due to chronic exposure to nonionizing radiation: Secondary | ICD-10-CM | POA: Diagnosis not present

## 2020-04-10 DIAGNOSIS — L814 Other melanin hyperpigmentation: Secondary | ICD-10-CM | POA: Diagnosis not present

## 2020-04-10 DIAGNOSIS — L820 Inflamed seborrheic keratosis: Secondary | ICD-10-CM | POA: Diagnosis not present

## 2020-04-10 DIAGNOSIS — L720 Epidermal cyst: Secondary | ICD-10-CM | POA: Diagnosis not present

## 2020-04-10 DIAGNOSIS — L821 Other seborrheic keratosis: Secondary | ICD-10-CM | POA: Diagnosis not present

## 2020-04-15 ENCOUNTER — Institutional Professional Consult (permissible substitution): Payer: Medicare HMO | Admitting: Diagnostic Neuroimaging

## 2020-04-15 ENCOUNTER — Telehealth: Payer: Self-pay

## 2020-04-15 DIAGNOSIS — Z01812 Encounter for preprocedural laboratory examination: Secondary | ICD-10-CM

## 2020-04-15 DIAGNOSIS — R0602 Shortness of breath: Secondary | ICD-10-CM

## 2020-04-15 MED ORDER — METOPROLOL TARTRATE 100 MG PO TABS
ORAL_TABLET | ORAL | 0 refills | Status: DC
Start: 2020-04-15 — End: 2020-05-21

## 2020-04-15 NOTE — Telephone Encounter (Signed)
Your cardiac CT will be scheduled at one of the below locations:   [facility] [address] [place], [place] [address] [phone]  OR  [facility] [address] [place], [place] [address] [phone]  If scheduled at [facility], please arrive at the North Tower main entrance of [facility] 30 minutes prior to test start time. Proceed to the [facility] (first floor) to check-in and test prep.  If scheduled at [facility], please arrive 15 mins early for check-in and test prep.  Please follow these instructions carefully (unless otherwise directed):  Hold all erectile dysfunction medications at least 3 days (72 hrs) prior to test.  On the Night Before the Test: . Be sure to Drink plenty of water. . Do not consume any caffeinated/decaffeinated beverages or chocolate 12 hours prior to your test. . Do not take any antihistamines 12 hours prior to your test.    On the Day of the Test: . Drink plenty of water. Do not drink any water within one hour of the test. . Do not eat any food 4 hours prior to the test. . You may take your regular medications prior to the test.  . Take metoprolol 100 mg two hours prior to test. . HOLD  Bisoprolol/HCTZ morning of the test.    After the Test: . Drink plenty of water. . After receiving IV contrast, you may experience a mild flushed feeling. This is normal. . On occasion, you may experience a mild rash up to 24 hours after the test. This is not dangerous. If this occurs, you can take Benadryl 25 mg and increase your fluid intake. . If you experience trouble breathing, this can be serious. If it is severe call 911 IMMEDIATELY. If it is mild, please call our office.    Once we have confirmed authorization from your insurance company, we will call you to set up a date and time for your test.   For non-scheduling  related questions, please contact the cardiac imaging nurse navigator should you have any questions/concerns: Sara Wallace, Cardiac Imaging Nurse Navigator Merle Tai, Interim Cardiac Imaging Nurse Navigator [facility] Direct Office Dial: [phone]   For scheduling needs, including cancellations and rescheduling, please call [phone].    

## 2020-04-16 ENCOUNTER — Other Ambulatory Visit: Payer: Self-pay

## 2020-04-16 ENCOUNTER — Ambulatory Visit: Payer: Medicare HMO | Admitting: Cardiovascular Disease

## 2020-04-16 ENCOUNTER — Encounter: Payer: Self-pay | Admitting: Cardiovascular Disease

## 2020-04-16 VITALS — BP 126/70 | HR 62 | Ht 67.0 in | Wt 214.6 lb

## 2020-04-16 DIAGNOSIS — R0600 Dyspnea, unspecified: Secondary | ICD-10-CM | POA: Diagnosis not present

## 2020-04-16 DIAGNOSIS — R0602 Shortness of breath: Secondary | ICD-10-CM

## 2020-04-16 DIAGNOSIS — Z01812 Encounter for preprocedural laboratory examination: Secondary | ICD-10-CM

## 2020-04-16 DIAGNOSIS — R0609 Other forms of dyspnea: Secondary | ICD-10-CM

## 2020-04-16 NOTE — Progress Notes (Signed)
Mr. Maben returns today for follow-up of dyspnea and edema.  He does continue to smoke.  Chest CT showed emphysema.  2D echo showed normal LV function with grade 1 diastolic dysfunction.  Coronary calcium score was 302 with calcium predominantly in the proximal LAD.  He denies chest pain.  This is being sent for CTA/FFR.  His LDL is low at 65.  I will see him back in 1 year unless his CTA shows significant stenosis requiring further evaluation.  Lorretta Harp, M.D., [facility], [facility], [place] [address] [place], [place]  [address]  [phone] 04/16/2020 11:30 AM

## 2020-04-16 NOTE — Patient Instructions (Signed)

## 2020-04-18 ENCOUNTER — Telehealth: Payer: Self-pay | Admitting: Cardiovascular Disease

## 2020-04-18 NOTE — Telephone Encounter (Signed)
   Went to chart to check who called pt

## 2020-04-20 DIAGNOSIS — G4733 Obstructive sleep apnea (adult) (pediatric): Secondary | ICD-10-CM | POA: Diagnosis not present

## 2020-05-08 ENCOUNTER — Telehealth (HOSPITAL_COMMUNITY): Payer: Self-pay | Admitting: *Deleted

## 2020-05-08 DIAGNOSIS — Z01812 Encounter for preprocedural laboratory examination: Secondary | ICD-10-CM | POA: Diagnosis not present

## 2020-05-08 DIAGNOSIS — R0602 Shortness of breath: Secondary | ICD-10-CM | POA: Diagnosis not present

## 2020-05-08 LAB — BASIC METABOLIC PANEL
BUN/Creatinine Ratio: 22 (ref 10–24)
BUN: 29 mg/dL — ABNORMAL HIGH (ref 8–27)
CO2: 24 mmol/L (ref 20–29)
Calcium: 10.2 mg/dL (ref 8.6–10.2)
Chloride: 101 mmol/L (ref 96–106)
Creatinine, Ser: 1.3 mg/dL — ABNORMAL HIGH (ref 0.76–1.27)
GFR calc Af Amer: 64 mL/min/{1.73_m2} (ref >59)
GFR calc non Af Amer: 56 mL/min/{1.73_m2} — ABNORMAL LOW (ref >59)
Glucose: 118 mg/dL — ABNORMAL HIGH (ref 65–99)
Potassium: 4.3 mmol/L (ref 3.5–5.2)
Sodium: 139 mmol/L (ref 134–144)

## 2020-05-08 NOTE — Telephone Encounter (Signed)
Reaching out to patient to offer assistance regarding upcoming cardiac imaging study; pt verbalizes understanding of appt date/time, parking situation and where to check in, pre-test NPO status and medications ordered, and verified current allergies; name and call back number provided for further questions should they arise  Merle Tai RN Navigator Cardiac Imaging [facility] [phone] office [phone] cell 

## 2020-05-09 ENCOUNTER — Ambulatory Visit (HOSPITAL_COMMUNITY)
Admission: RE | Admit: 2020-05-09 | Discharge: 2020-05-09 | Disposition: A | Discharge status: Home / Self Care | Payer: Medicare HMO | Source: Ambulatory Visit | Attending: Cardiovascular Disease | Admitting: Cardiovascular Disease

## 2020-05-09 ENCOUNTER — Other Ambulatory Visit: Payer: Self-pay

## 2020-05-09 DIAGNOSIS — I2510 Atherosclerotic heart disease of native coronary artery without angina pectoris: Secondary | ICD-10-CM | POA: Diagnosis not present

## 2020-05-09 DIAGNOSIS — I700 Atherosclerosis of aorta: Secondary | ICD-10-CM | POA: Insufficient documentation

## 2020-05-09 DIAGNOSIS — Z6833 Body mass index (BMI) 33.0-33.9, adult: Secondary | ICD-10-CM | POA: Diagnosis not present

## 2020-05-09 DIAGNOSIS — R0602 Shortness of breath: Secondary | ICD-10-CM | POA: Diagnosis not present

## 2020-05-09 MED ORDER — IOHEXOL 350 MG/ML SOLN
80.0000 mL | Freq: Once | INTRAVENOUS | Status: AC | PRN
  Administered 2020-05-09: 80 mL via INTRAVENOUS

## 2020-05-09 MED ORDER — NITROGLYCERIN 0.4 MG SL SUBL: 0.8000 mg | SUBLINGUAL_TABLET | Freq: Once | SUBLINGUAL | Status: AC

## 2020-05-09 MED ORDER — NITROGLYCERIN 0.4 MG SL SUBL
SUBLINGUAL_TABLET | SUBLINGUAL | Status: AC
  Administered 2020-05-09: 0.8 mg via SUBLINGUAL
  Filled 2020-05-09: qty 2

## 2020-05-10 ENCOUNTER — Ambulatory Visit (HOSPITAL_COMMUNITY)
Admission: RE | Admit: 2020-05-10 | Discharge: 2020-05-10 | Disposition: A | Discharge status: Home / Self Care | Payer: Medicare HMO | Source: Ambulatory Visit | Attending: Cardiovascular Disease | Admitting: Cardiovascular Disease

## 2020-05-10 DIAGNOSIS — R0602 Shortness of breath: Secondary | ICD-10-CM | POA: Insufficient documentation

## 2020-05-10 DIAGNOSIS — I700 Atherosclerosis of aorta: Secondary | ICD-10-CM | POA: Diagnosis not present

## 2020-05-10 DIAGNOSIS — Z6833 Body mass index (BMI) 33.0-33.9, adult: Secondary | ICD-10-CM | POA: Diagnosis not present

## 2020-05-10 DIAGNOSIS — I2510 Atherosclerotic heart disease of native coronary artery without angina pectoris: Secondary | ICD-10-CM | POA: Insufficient documentation

## 2020-05-20 NOTE — Patient Instructions (Signed)

## 2020-05-20 NOTE — Progress Notes (Signed)
History of Present Illness:       This very nice 69 y.o.   MWM presents for 6 month follow up with HTN, HLD, Pre-Diabetes and Vitamin D Deficiency. Patient has Gout controlled on his meds. Patient also is on CPAP for OSA with improved restorative Sleep.      Patient is treated for HTN (1980's) & BP has been controlled at home. Today's BP was slightly elevated & rechecked at goal - 134/72. Patient has had no complaints of any cardiac type chest pain, palpitations, dyspnea / orthopnea / PND, dizziness, claudication, or dependent edema.      Hyperlipidemia is controlled with diet & Rosuvastatin.  Patient denies myalgias or other med SE's. Last Lipids were at goal except elevated Trig's:  Lab Results  Component Value Date   CHOL 134 02/06/2020   HDL 40 02/06/2020   LDLCALC 65 02/06/2020   TRIG 232 (H) 02/06/2020   CHOLHDL 3.4 02/06/2020    Also, the patient has history of PreDiabetes  (A1c 5.7% / 2011) and has had no symptoms of reactive hypoglycemia, diabetic polys, paresthesias or visual blurring.  Last A1c was near goal:  Lab Results  Component Value Date   HGBA1C 5.7 (H) 11/07/2019       Further, the patient also has history of Vitamin D Deficiency ("12" / 2009)  and supplements vitamin D without any suspected side-effects. Last vitamin D was at goal:  Lab Results  Component Value Date   VD25OH 63 11/07/2019    Current Outpatient Medications on File Prior to Visit  Medication Sig  . allopurinol (ZYLOPRIM) 300 MG tablet Take 1 tablet Daily to Prevent Gout (Patient taking differently: Take 300 mg by mouth daily. )  . bisoprolol-hydrochlorothiazide (ZIAC) 10-6.25 MG tablet TAKE 1 TABLET BY MOUTH EVERY DAY FOR BLOOD PRESSURE  . Cholecalciferol (VITAMIN D3) 125 MCG (5000 UT) CAPS Take 10,000 Units by mouth daily.  . clonazePAM (KLONOPIN) 1 MG tablet TAKE 1/2 TO 1 TABLET AT BEDTIME ONLY IF NEEDED & TRY TO LIMIT TO 5 DAYS /WEEK  . gabapentin (NEURONTIN) 600 MG tablet Take 1/2  to 1 tablet 2 to 3 x /Daily as needed for Neuropathy Pain  . KRILL OIL PO Take 700 mg by mouth daily.   . Magnesium 400 MG CAPS Take 800 mg by mouth daily.   . meloxicam (MOBIC) 15 MG tablet Take 1 tablet Daily with Food for Pain & Inflammation  . rosuvastatin (CRESTOR) 40 MG tablet Take 1 tablet Daily for Cholesterol  . telmisartan (MICARDIS) 80 MG tablet Take 1 tablet Daily for BP  . zinc gluconate 50 MG tablet Take 50 mg by mouth daily.  . Ascorbic Acid (VITAMIN C) 1000 MG tablet Take 1,000 mg by mouth daily. (Patient not taking: Reported on 05/21/2020)   No current facility-administered medications on file prior to visit.    No Known Allergies  PMHx:   Past Medical History:  Diagnosis Date  . Colon polyps   . DJD (degenerative joint disease)   . ED (erectile dysfunction)   . Gout   . Hyperlipemia   . Hypertension   . Obstructive sleep apnea    08/2012, cpap setting of 14  . Prostate cancer ([place]) 01/2002   s/p prostatectomy  . Vitamin D deficiency     Immunization History  Administered Date(s) Administered  . Influenza Split 08/26/2014  . Influenza, High Dose Seasonal PF 08/12/2016, 10/17/2018, 09/05/2019  . Influenza, Seasonal, Injecte, Preservative Fre 10/27/2015  .  PFIZER SARS-COV-2 Vaccination 01/05/2020, 01/26/2020  . PPD Test 05/23/2014, 07/23/2015  . Pneumococcal Conjugate-13 08/26/2014  . Pneumococcal Polysaccharide-23 05/05/2010, 01/30/2016  . Td 11/30/2003  . Tdap 08/12/2010  . Zoster 04/29/2013    Past Surgical History:  Procedure Laterality Date  . COLONOSCOPY WITH PROPOFOL N/A 09/06/2016   Procedure: COLONOSCOPY WITH PROPOFOL;  Surgeon: Garlan Fair, MD;  Location: [facility];  Service: Endoscopy;  Laterality: N/A;  . colonscopy     x 3 with polyp one time  . PROSTATECTOMY  2003  . SKIN CANCER EXCISION      FHx:    Reviewed / unchanged  SHx:    Reviewed / unchanged   Systems Review:  Constitutional: Denies fever, chills, wt changes,  headaches, insomnia, fatigue, night sweats, change in appetite. Eyes: Denies redness, blurred vision, diplopia, discharge, itchy, watery eyes.  ENT: Denies discharge, congestion, post nasal drip, epistaxis, sore throat, earache, hearing loss, dental pain, tinnitus, vertigo, sinus pain, snoring.  CV: Denies chest pain, palpitations, irregular heartbeat, syncope, dyspnea, diaphoresis, orthopnea, PND, claudication or edema. Respiratory: denies cough, dyspnea, DOE, pleurisy, hoarseness, laryngitis, wheezing.  Gastrointestinal: Denies dysphagia, odynophagia, heartburn, reflux, water brash, abdominal pain or cramps, nausea, vomiting, bloating, diarrhea, constipation, hematemesis, melena, hematochezia  or hemorrhoids. Genitourinary: Denies dysuria, frequency, urgency, nocturia, hesitancy, discharge, hematuria or flank pain. Musculoskeletal: Denies arthralgias, myalgias, stiffness, jt. swelling, pain, limping or strain/sprain.  Skin: Denies pruritus, rash, hives, warts, acne, eczema or change in skin lesion(s). Neuro: No weakness, tremor, incoordination, spasms, paresthesia or pain. Psychiatric: Denies confusion, memory loss or sensory loss. Endo: Denies change in weight, skin or hair change.  Heme/Lymph: No excessive bleeding, bruising or enlarged lymph nodes.  Physical Exam  BP 134/72   Pulse 60   Temp (!) 97.5 F (36.4 C)   Resp 16   Ht 5' 6.75" (1.695 m)   Wt 212 lb 9.6 oz (96.4 kg)   BMI 33.55 kg/m   Appears  well nourished, well groomed  and in no distress.  Eyes: PERRLA, EOMs, conjunctiva no swelling or erythema. Sinuses: No frontal/maxillary tenderness ENT/Mouth: EAC's clear, TM's nl w/o erythema, bulging. Nares clear w/o erythema, swelling, exudates. Oropharynx clear without erythema or exudates. Oral hygiene is good. Tongue normal, non obstructing. Hearing intact.  Neck: Supple. Thyroid not palpable. Car 2+/2+ without bruits, nodes or JVD. Chest: Respirations nl with BS clear &  equal w/o rales, rhonchi, wheezing or stridor.  Cor: Heart sounds normal w/ regular rate and rhythm without sig. murmurs, gallops, clicks or rubs. Peripheral pulses normal and equal  without edema.  Abdomen: Soft & bowel sounds normal. Non-tender w/o guarding, rebound, hernias, masses or organomegaly.  Lymphatics: Unremarkable.  Musculoskeletal: Full ROM all peripheral extremities, joint stability, 5/5 strength and normal gait.  Skin: Warm, dry without exposed rashes, lesions or ecchymosis apparent.  Neuro: Cranial nerves intact, reflexes equal bilaterally. Sensory-motor testing grossly intact. Tendon reflexes grossly intact.  Pysch: Alert & oriented x 3.  Insight and judgement nl & appropriate. No ideations.  Assessment and Plan:  1. Essential hypertension  - Continue medication, monitor blood pressure at home.  - Continue DASH diet.  Reminder to go to the ER if any CP,  SOB, nausea, dizziness, severe HA, changes vision/speech.  - COMPLETE METABOLIC PANEL WITH GFR - CBC with Differential/Platelet - Magnesium - TSH  2. Hyperlipidemia, mixed  - Continue diet/meds, exercise,& lifestyle modifications.  - Continue monitor periodic cholesterol/liver & renal functions   - Lipid panel - TSH  3.  Abnormal glucose  - Hemoglobin A1c - Insulin, random  4. Vitamin D deficiency  - Continue diet, exercise  - Lifestyle modifications.  - Monitor appropriate labs. - Continue supplementation.  - VITAMIN D 25 Hydroxy   5. Idiopathic gout  - Uric acid    6. Medication management  - COMPLETE METABOLIC PANEL WITH GFR - CBC with Differential/Platelet - Magnesium - Lipid panel - TSH - Hemoglobin A1c - Insulin, random - VITAMIN D 25 Hydroxy       Discussed  regular exercise, BP monitoring, weight control to achieve/maintain BMI less than 25 and discussed med and SE's. Recommended labs to assess and monitor clinical status with further disposition pending results of labs.  I discussed  the assessment and treatment plan with the patient. The patient was provided an opportunity to ask questions and all were answered. The patient agreed with the plan and demonstrated an understanding of the instructions.  I provided over 30 minutes of exam, counseling, chart review and  complex critical decision making.    Kirtland Bouchard, MD

## 2020-05-21 ENCOUNTER — Ambulatory Visit (INDEPENDENT_AMBULATORY_CARE_PROVIDER_SITE_OTHER): Payer: Medicare HMO | Admitting: Internal Medicine

## 2020-05-21 ENCOUNTER — Encounter: Payer: Self-pay | Admitting: Internal Medicine

## 2020-05-21 ENCOUNTER — Other Ambulatory Visit: Payer: Self-pay

## 2020-05-21 VITALS — BP 134/72 | HR 60 | Temp 97.5°F | Resp 16 | Ht 66.75 in | Wt 212.6 lb

## 2020-05-21 DIAGNOSIS — E559 Vitamin D deficiency, unspecified: Secondary | ICD-10-CM

## 2020-05-21 DIAGNOSIS — M1000 Idiopathic gout, unspecified site: Secondary | ICD-10-CM | POA: Diagnosis not present

## 2020-05-21 DIAGNOSIS — E782 Mixed hyperlipidemia: Secondary | ICD-10-CM | POA: Diagnosis not present

## 2020-05-21 DIAGNOSIS — R7309 Other abnormal glucose: Secondary | ICD-10-CM | POA: Diagnosis not present

## 2020-05-21 DIAGNOSIS — I10 Essential (primary) hypertension: Secondary | ICD-10-CM | POA: Diagnosis not present

## 2020-05-21 DIAGNOSIS — Z79899 Other long term (current) drug therapy: Secondary | ICD-10-CM

## 2020-05-22 LAB — LIPID PANEL
Cholesterol: 120 mg/dL (ref <200)
HDL: 33 mg/dL — ABNORMAL LOW (ref >=40)
LDL Cholesterol (Calc): 55 mg/dL (calc)
Non-HDL Cholesterol (Calc): 87 mg/dL (calc) (ref <130)
Total CHOL/HDL Ratio: 3.6 (calc) (ref <5.0)
Triglycerides: 301 mg/dL — ABNORMAL HIGH (ref <150)

## 2020-05-22 LAB — CBC WITH DIFFERENTIAL/PLATELET
Absolute Monocytes: 540 cells/uL (ref 200–950)
Basophils Absolute: 51 cells/uL (ref 0–200)
Basophils Relative: 0.7 %
Eosinophils Absolute: 80 cells/uL (ref 15–500)
Eosinophils Relative: 1.1 %
HCT: 48.1 % (ref 38.5–50.0)
Hemoglobin: 16.6 g/dL (ref 13.2–17.1)
Lymphs Abs: 1993 cells/uL (ref 850–3900)
MCH: 32.9 pg (ref 27.0–33.0)
MCHC: 34.5 g/dL (ref 32.0–36.0)
MCV: 95.2 fL (ref 80.0–100.0)
MPV: 11.1 fL (ref 7.5–12.5)
Monocytes Relative: 7.4 %
Neutro Abs: 4636 cells/uL (ref 1500–7800)
Neutrophils Relative %: 63.5 %
Platelets: 152 10*3/uL (ref 140–400)
RBC: 5.05 10*6/uL (ref 4.20–5.80)
RDW: 13.7 % (ref 11.0–15.0)
Total Lymphocyte: 27.3 %
WBC: 7.3 10*3/uL (ref 3.8–10.8)

## 2020-05-22 LAB — COMPLETE METABOLIC PANEL WITH GFR
AG Ratio: 1.8 (calc) (ref 1.0–2.5)
ALT: 17 U/L (ref 9–46)
AST: 16 U/L (ref 10–35)
Albumin: 4.2 g/dL (ref 3.6–5.1)
Alkaline phosphatase (APISO): 55 U/L (ref 35–144)
BUN/Creatinine Ratio: 20 (calc) (ref 6–22)
BUN: 27 mg/dL — ABNORMAL HIGH (ref 7–25)
CO2: 27 mmol/L (ref 20–32)
Calcium: 9.9 mg/dL (ref 8.6–10.3)
Chloride: 104 mmol/L (ref 98–110)
Creat: 1.33 mg/dL — ABNORMAL HIGH (ref 0.70–1.25)
GFR, Est African American: 63 mL/min/{1.73_m2} (ref >=60)
GFR, Est Non African American: 54 mL/min/{1.73_m2} — ABNORMAL LOW (ref >=60)
Globulin: 2.4 g/dL (calc) (ref 1.9–3.7)
Glucose, Bld: 106 mg/dL — ABNORMAL HIGH (ref 65–99)
Potassium: 4.3 mmol/L (ref 3.5–5.3)
Sodium: 140 mmol/L (ref 135–146)
Total Bilirubin: 0.6 mg/dL (ref 0.2–1.2)
Total Protein: 6.6 g/dL (ref 6.1–8.1)

## 2020-05-22 LAB — VITAMIN D 25 HYDROXY (VIT D DEFICIENCY, FRACTURES): Vit D, 25-Hydroxy: 76 ng/mL (ref 30–100)

## 2020-05-22 LAB — HEMOGLOBIN A1C
Hgb A1c MFr Bld: 5.3 % of total Hgb (ref <5.7)
Mean Plasma Glucose: 105 (calc)
eAG (mmol/L): 5.8 (calc)

## 2020-05-22 LAB — URIC ACID: Uric Acid, Serum: 6.3 mg/dL (ref 4.0–8.0)

## 2020-05-22 LAB — INSULIN, RANDOM: Insulin: 152.6 u[IU]/mL — ABNORMAL HIGH

## 2020-05-22 LAB — TSH: TSH: 1.24 mIU/L (ref 0.40–4.50)

## 2020-05-22 LAB — MAGNESIUM: Magnesium: 1.8 mg/dL (ref 1.5–2.5)

## 2020-05-22 NOTE — Progress Notes (Signed)
=========================================================  -    Kidney functions - still Stage 3a - & Stable =========================================================  - Total Chol = 120 and LDL Chol = 55 - Both Excellent   - Very low risk for Heart Attack  / Stroke =========================================================  -  Also Triglycerides (    301   ) or fats in blood are too high  (goal is less than 150)    - Recommend avoid fried & greasy foods,  sweets / candy,   - Avoid white rice  (brown or wild rice or Quinoa is OK),   - Avoid white potatoes  (sweet potatoes are OK)   - Avoid anything made from white flour  - bagels, doughnuts, rolls, buns, biscuits, white and   wheat breads, pizza crust and traditional  pasta made of white flour & egg white  - (vegetarian pasta or spinach or wheat pasta is OK).    - Multi-grain bread is OK - like multi-grain flat bread or  sandwich thins.   - Avoid alcohol in excess.   - Exercise is also important. =========================================================  -  A1c - Normal - Great - No Diabetes =========================================================  -  Vitamin D = 76 = Excellent  =========================================================  -  Uric acid   / Gout test Normal & OK  =========================================================  -  All Else - CBC - Kidneys - Electrolytes -  Liver - Magnesium & Thyroid  - all  Normal / OK =========================================================

## 2020-06-25 ENCOUNTER — Other Ambulatory Visit: Payer: Self-pay | Admitting: Internal Medicine

## 2020-07-01 ENCOUNTER — Other Ambulatory Visit: Payer: Self-pay | Admitting: Internal Medicine

## 2020-07-24 DIAGNOSIS — G4733 Obstructive sleep apnea (adult) (pediatric): Secondary | ICD-10-CM | POA: Diagnosis not present

## 2020-08-05 ENCOUNTER — Ambulatory Visit: Payer: Medicare HMO | Admitting: Physician Assistant

## 2020-08-13 DIAGNOSIS — R69 Illness, unspecified: Secondary | ICD-10-CM | POA: Diagnosis not present

## 2020-08-20 NOTE — Progress Notes (Signed)
MEDICARE WELLNESS Assessment:   Essential hypertension - continue medications, DASH diet, exercise and monitor at home. Call if greater than 130/80.  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  - continue yearly low dose screening CT  Mixed hyperlipidemia -continue medications, check lipids, decrease fatty foods, increase activity.  -     Lipid panel  Prediabetes Discussed general issues about diabetes pathophysiology and management., Educational material distributed., Suggested low cholesterol diet., Encouraged aerobic exercise., Discussed foot care., Reminded to get yearly retinal exam. -     Hemoglobin A1c  Vitamin D deficiency Continue supplement  Idiopathic gout, unspecified chronicity, unspecified site Gout- recheck Uric acid as needed, Diet discussed, continue medications.  Medication management -     Magnesium  Erectile dysfunction, unspecified erectile dysfunction type Weight loss advised  Osteoarthritis, unspecified osteoarthritis type, unspecified site Weight loss advised  Encounter for Medicare annual wellness exam Due next year  Chronic obstructive pulmonary disease, unspecified COPD type ([place]) + exacerbation right now, declines inhalers at this time, advised to quit smoking  Aortic atherosclerosis ([place]) Control blood pressure, cholesterol, glucose, increase exercise.   Paresthesias- no DM, will get some labs ? From  back/neck -     Vitamin B12 -     Vitamin B1, whole blood -     Zinc -     ANA -     RPR -     C-reactive protein -     B. burgdorfi antibodies  B12 deficiency -     Vitamin B12  COPD exacerbation ([facility]) -     doxycycline (VIBRAMYCIN) 100 MG capsule; Take 1 capsule twice daily with food -     predniSONE (DELTASONE) 20 MG tablet; 2 tablets daily for 3 days, 1 tablet daily for 4 days.  Need TDAP -     Tdap vaccine greater than or equal to 7yo IM     Over 30 minutes of exam, counseling, chart review, and critical decision making was performed Future Appointments  Date Time Provider [place]  12/03/2020  2:00 PM [name], MD [facility] None  08/18/2021 10:00 AM Garnet Sierras, NP [facility] None    Plan:   During the course of the visit the patient was educated and counseled about appropriate screening and preventive services including:    Pneumococcal vaccine   Influenza vaccine  Prevnar 13  Td vaccine  Screening electrocardiogram  Colorectal cancer screening  Diabetes screening  Glaucoma screening  Nutrition counseling    Subjective:  Stephen Holland is a 69 y.o. male who presents for Medicare Annual Wellness Visit and 3 month follow up for HTN, hyperlipidemia, prediabetes, and vitamin D Def.   From dental work, xray showed deposits on carotids. Patient is smoker. Denies vision changes, speech changes, dizziness.  He does smoke, 50+ pack  Year smoking history, chantix did not help, him and his wife  are interested in quitting BUT HE DECLINES CHANTIX AT THIS TIME. He has COPD, not on meds at this time. He has OSA.   HE was having DOE, saw cardiology, had CT coronary 04/2020, showed non obstructive CAD. He was having proteinuria, had normal protein electrophoresis urine and normal work up, he is on ARB.  Saw Dr. Leta Baptist for neuropathy 03/2020, did not get labs that were placed.Marland KitchenHe states the gabapentin is not helping him, still having numbness  and tingling in fingers and toes. Has history of back pain/neck pain with DDD, has been getting injections, not helping, suggested surgery but the patient is not interested.  Still working 2 days a week trucking.   His blood pressure has been controlled at home, today their BP is BP: 130/80  He does workout. He denies chest pain, shortness of breath, dizziness. BMI is Body mass index is 32.42 kg/m. He admits to eating too much.   Wt Readings from Last 3 Encounters:  08/21/20 207 lb (93.9 kg)  05/21/20 212 lb 9.6 oz (96.4 kg)  04/16/20 214 lb 9.6 oz (97.3 kg)   Takes klonopin for sleep but will take every other day.   He is on cholesterol medication and denies myalgias. His cholesterol is at goal. The cholesterol last visit was:   Lab Results  Component Value Date   CHOL 120 05/21/2020   HDL 33 (L) 05/21/2020   LDLCALC 55 05/21/2020   TRIG 301 (H) 05/21/2020   CHOLHDL 3.6 05/21/2020    He has been working on diet and exercise for prediabetes, and denies paresthesia of the feet, polydipsia, polyuria and visual disturbances.  Last A1C in the office was:  Lab Results  Component Value Date   HGBA1C 5.3 05/21/2020   Lab Results  Component Value Date   GFRNONAA 20 (L) 05/21/2020   Patient is on Vitamin D supplement.   Lab Results  Component Value Date   VD25OH 76 05/21/2020     Patient is on allopurinol for gout and does not report a recent flare.  Lab Results  Component Value Date   LABURIC 6.3 05/21/2020   Medication Review:   Current Outpatient Medications (Cardiovascular):  .  bisoprolol-hydrochlorothiazide (ZIAC) 10-6.25 MG tablet, TAKE 1 TABLET BY MOUTH EVERY DAY FOR BLOOD PRESSURE .  rosuvastatin (CRESTOR) 40 MG tablet, Take 1 tablet Daily for Cholesterol .  telmisartan (MICARDIS) 80 MG tablet, Take 1 tablet Daily for BP   Current Outpatient Medications (Analgesics):  .  allopurinol (ZYLOPRIM) 300 MG tablet, TAKE 1 TABLET BY MOUTH DAILY TO PREVENT GOUT .   meloxicam (MOBIC) 15 MG tablet, Take 1 tablet Daily with Food for Pain & Inflammation   Current Outpatient Medications (Other):  Marland Kitchen  Cholecalciferol (VITAMIN D3) 125 MCG (5000 UT) CAPS, Take 10,000 Units by mouth daily. .  clonazePAM (KLONOPIN) 1 MG tablet, TAKE 1/2 TO 1 TABLET AT BEDTIME ONLY IF NEEDED & TRY TO LIMIT TO 5 DAYS /WEEK .  gabapentin (NEURONTIN) 600 MG tablet, Take 1/2 to 1 tablet 2 to 3 x /Daily as needed for Neuropathy Pain .  KRILL OIL PO, Take 700 mg by mouth daily.  .  Magnesium 400 MG CAPS, Take 800 mg by mouth daily.  Marland Kitchen  zinc gluconate 50 MG tablet, Take 50 mg by mouth daily.  Current Problems (verified) Patient Active Problem List   Diagnosis Date Noted  . Dyspnea on exertion 03/19/2020  . Coronary artery calcification seen on CT scan 03/19/2020  . Persistent proteinuria 02/22/2020  .  Aortic atherosclerosis ([place]) 06/30/2017  . COPD (chronic obstructive pulmonary disease) ([place]) 06/13/2017  . Medication management 12/03/2014  . Morbid obesity ([place]) 08/26/2014  . Tobacco use disorder 08/26/2014  . Essential hypertension   . Hyperlipidemia, mixed   . Abnormal glucose   . OSA and COPD overlap syndrome ([place])   . Vitamin D deficiency   . DJD (degenerative joint disease)   . Primary gout   . ED (erectile dysfunction)     Screening Tests Immunization History  Administered Date(s) Administered  . Influenza Split 08/26/2014  . Influenza, High Dose Seasonal PF 08/12/2016, 10/17/2018, 09/05/2019  . Influenza, Seasonal, Injecte, Preservative Fre 10/27/2015  . PFIZER SARS-COV-2 Vaccination 01/05/2020, 01/26/2020  . PPD Test 05/23/2014, 07/23/2015  . Pneumococcal Conjugate-13 08/26/2014  . Pneumococcal Polysaccharide-23 05/05/2010, 01/30/2016  . Td 11/30/2003  . Tdap 08/12/2010  . Zoster 04/29/2013   Health Maintenance  Topic Date Due  . INFLUENZA VACCINE  06/29/2020  . TETANUS/TDAP  08/12/2020  . COLONOSCOPY  09/06/2026  . COVID-19 Vaccine  Completed  .  Hepatitis C Screening  Completed  . PNA vac Low Risk Adult  Completed    Preventative care: Last colonoscopy: 08/2016 last once per patient, Dr. Collene Mares CT chest 10/2019 IMPRESSION: Lung-RADS 1, negative. Continue annual screening with low-dose chest CT without contrast in 12 months. Aortic Atherosclerosis (ICD10-I70.0) and Emphysema (ICD10-J43.9).  Prior vaccinations: TD or Tdap: 2011 DUE this year   Influenza: 2018 DUE Pneumococcal: 2011 Prevnar13: 2015 Shingles/Zostavax: 2014- GET NEW ONE- GIVEN INFORMATION UTD  Names of Other Physician/Practitioners you currently use: 1. [facility] here for primary care 2. [facility], Therapist, art, last visit 2016 3. Dr. Freda Munro and Kalman Shan, dentist, last visit 2018 q 6 months Patient Care Team: [name], MD as PCP - General (Internal Medicine)  Allergies No Known Allergies  SURGICAL HISTORY He  has a past surgical history that includes Prostatectomy (2003); colonscopy; Colonoscopy with propofol (N/A, 09/06/2016); and Skin cancer excision. FAMILY HISTORY His family history includes Colon cancer in his paternal grandfather; Heart attack in his maternal grandmother; Hypertension in his mother; Pulmonary fibrosis in his mother; Stroke in his father, maternal grandfather, and paternal grandmother. SOCIAL HISTORY He  reports that he has been smoking cigarettes. He has a 45.00 pack-year smoking history. He has never used smokeless tobacco. He reports current alcohol use of about 15.0 standard drinks of alcohol per week. He reports that he does not use drugs.  MEDICARE WELLNESS OBJECTIVES: Physical activity:   Cardiac risk factors:   Depression/mood screen:   Depression screen [facility] 2/9 05/20/2020  Decreased Interest 0  Down, Depressed, Hopeless 0  PHQ - 2 Score 0    ADLs:  In your present state of health, do you have any difficulty performing the following activities: 11/06/2019  Hearing? N  Vision? N   Difficulty concentrating or making decisions? N  Walking or climbing stairs? N  Dressing or bathing? N  Doing errands, shopping? N  Some recent data might be hidden     Cognitive Testing  Alert? Yes  Normal Appearance?Yes  Oriented to person? Yes  Place? Yes   Time? Yes  Recall of three objects?  Yes  Can perform simple calculations? Yes  Displays appropriate judgment?Yes  Can read the correct time from a watch face?Yes  EOL planning: Does Patient Have a Medical Advance Directive?: Yes Type of Advance Directive: Healthcare Power of Attorney, Living will Copy of [place] in Chart?: No -  copy requested   Objective:   Today's Vitals   08/21/20 1125  BP: 130/80  Pulse: 65  Temp: 97.6 F (36.4 C)  SpO2: 95%  Weight: 207 lb (93.9 kg)  Height: 5\' 7"  (1.702 m)  PainSc: 0-No pain   Body mass index is 32.42 kg/m.    General appearance: alert, no distress, WD/WN, male HEENT: normocephalic, sclerae anicteric, TMs pearly, nares patent, no discharge or erythema, pharynx normal Oral cavity: MMM, no lesions Neck: supple, no lymphadenopathy, no thyromegaly, no masses Heart: RRR, normal S1, S2, no murmurs Lungs: CTA bilaterally, no wheezes, rhonchi, or rales Abdomen: +bs, soft, obese non tender, non distended, no masses, no hepatomegaly, no splenomegaly Musculoskeletal: nontender, no swelling, no obvious deformity Extremities: no edema, no cyanosis, no clubbing Pulses: 2+ symmetric, upper and lower extremities, normal cap refill Neurological: alert, oriented x 3, CN2-12 intact, strength normal upper extremities and lower extremities, sensation normal throughout, DTRs 2+ throughout, no cerebellar signs, gait normal Psychiatric: normal affect, behavior normal, pleasant   Medicare Attestation I have personally reviewed: The patient's medical and social history Their use of alcohol, tobacco or illicit drugs Their current medications and supplements The  patient's functional ability including ADLs,fall risks, home safety risks, cognitive, and hearing and visual impairment Diet and physical activities Evidence for depression or mood disorders  The patient's weight, height, BMI, and visual acuity have been recorded in the chart.  I have made referrals, counseling, and provided education to the patient based on review of the above and I have provided the patient with a written personalized care plan for preventive services.     Vicie Mutters, PA-C   08/21/2020

## 2020-08-21 ENCOUNTER — Encounter: Payer: Self-pay | Admitting: Physician Assistant

## 2020-08-21 ENCOUNTER — Ambulatory Visit (INDEPENDENT_AMBULATORY_CARE_PROVIDER_SITE_OTHER): Payer: Medicare HMO | Admitting: Physician Assistant

## 2020-08-21 ENCOUNTER — Other Ambulatory Visit: Payer: Self-pay

## 2020-08-21 VITALS — BP 130/80 | HR 65 | Temp 97.6°F | Ht 67.0 in | Wt 207.0 lb

## 2020-08-21 DIAGNOSIS — E538 Deficiency of other specified B group vitamins: Secondary | ICD-10-CM

## 2020-08-21 DIAGNOSIS — Z0001 Encounter for general adult medical examination with abnormal findings: Secondary | ICD-10-CM | POA: Diagnosis not present

## 2020-08-21 DIAGNOSIS — M1990 Unspecified osteoarthritis, unspecified site: Secondary | ICD-10-CM

## 2020-08-21 DIAGNOSIS — I700 Atherosclerosis of aorta: Secondary | ICD-10-CM | POA: Diagnosis not present

## 2020-08-21 DIAGNOSIS — G4733 Obstructive sleep apnea (adult) (pediatric): Secondary | ICD-10-CM

## 2020-08-21 DIAGNOSIS — F17200 Nicotine dependence, unspecified, uncomplicated: Secondary | ICD-10-CM

## 2020-08-21 DIAGNOSIS — R6889 Other general symptoms and signs: Secondary | ICD-10-CM | POA: Diagnosis not present

## 2020-08-21 DIAGNOSIS — Z0000 Encounter for general adult medical examination without abnormal findings: Secondary | ICD-10-CM

## 2020-08-21 DIAGNOSIS — R7309 Other abnormal glucose: Secondary | ICD-10-CM

## 2020-08-21 DIAGNOSIS — R202 Paresthesia of skin: Secondary | ICD-10-CM

## 2020-08-21 DIAGNOSIS — Z23 Encounter for immunization: Secondary | ICD-10-CM

## 2020-08-21 DIAGNOSIS — E559 Vitamin D deficiency, unspecified: Secondary | ICD-10-CM

## 2020-08-21 DIAGNOSIS — I10 Essential (primary) hypertension: Secondary | ICD-10-CM | POA: Diagnosis not present

## 2020-08-21 DIAGNOSIS — E782 Mixed hyperlipidemia: Secondary | ICD-10-CM | POA: Diagnosis not present

## 2020-08-21 DIAGNOSIS — J449 Chronic obstructive pulmonary disease, unspecified: Secondary | ICD-10-CM

## 2020-08-21 DIAGNOSIS — N529 Male erectile dysfunction, unspecified: Secondary | ICD-10-CM

## 2020-08-21 DIAGNOSIS — Z79899 Other long term (current) drug therapy: Secondary | ICD-10-CM

## 2020-08-21 DIAGNOSIS — M1000 Idiopathic gout, unspecified site: Secondary | ICD-10-CM

## 2020-08-21 DIAGNOSIS — J441 Chronic obstructive pulmonary disease with (acute) exacerbation: Secondary | ICD-10-CM

## 2020-08-21 MED ORDER — DOXYCYCLINE HYCLATE 100 MG PO CAPS: ORAL_CAPSULE | ORAL | 0 refills | Status: DC

## 2020-08-21 MED ORDER — PREDNISONE 20 MG PO TABS: ORAL_TABLET | ORAL | 0 refills | Status: DC

## 2020-08-21 NOTE — Patient Instructions (Addendum)
SMOKING CESSATION  [employer]  [phone] for more information or for a free program for smoking cessation help.   You can call QUIT SMART 1-800-QUIT-NOW for free nicotine patches or replacement therapy- if they are out- keep calling  [facility] Can call for smoking cessation classes, [phone]  If you have a smart phone, please look up Smoke Free app, this will help you stay on track and give you information about money you have saved, life that you have gained back and a ton of more information.     ADVANTAGES OF QUITTING SMOKING  Within 20 minutes, blood pressure decreases. Your pulse is at normal level.  After 8 hours, carbon monoxide levels in the blood return to normal. Your oxygen level increases.  After 24 hours, the chance of having a heart attack starts to decrease. Your breath, hair, and body stop smelling like smoke.  After 48 hours, damaged nerve endings begin to recover. Your sense of taste and smell improve.  After 72 hours, the body is virtually free of nicotine. Your bronchial tubes relax and breathing becomes easier.  After 2 to 12 weeks, lungs can hold more air. Exercise becomes easier and circulation improves.  After 1 year, the risk of coronary heart disease is cut in half.  After 5 years, the risk of stroke falls to the same as a nonsmoker.  After 10 years, the risk of lung cancer is cut in half and the risk of other cancers decreases significantly.  After 15 years, the risk of coronary heart disease drops, usually to the level of a nonsmoker.  You will have extra money to spend on things other than cigarettes.    Ask insurance and pharmacy about shingrix - it is a 2 part shot that we will not be getting in the office.   YOU ARE DUE 2024 AT LEAST, HAD THE OLD SHINGLES VACCINE THAT GIVES SOME PROTECTION  Suggest getting AFTER covid vaccines, have to wait at least a month This shot can make you feel bad due to such  good immune response it can trigger some inflammation so take tylenol or aleve day of or day after and plan on resting.   Can go to [email or web address] for more information  Shingrix Vaccination  Two vaccines are licensed and recommended to prevent shingles in the [place] Zoster vaccine live (ZVL, Zostavax) has been in use since 2006. Recombinant zoster vaccine (RZV, Shingrix), has been in use since 2017 and is recommended by ACIP as the preferred shingles vaccine.  What Everyone Should Know about Shingles Vaccine (Shingrix) One of the Recommended Vaccines by Disease Shingles vaccination is the only way to protect against shingles and postherpetic neuralgia (PHN), the most common complication from shingles. CDC recommends that healthy adults 50 years and older get two doses of the shingles vaccine called Shingrix (recombinant zoster vaccine), separated by 2 to 6 months, to prevent shingles and the complications from the disease. Your doctor or pharmacist can give you Shingrix as a shot in your upper arm. Shingrix provides strong protection against shingles and PHN. Two doses of Shingrix is more than 90% effective at preventing shingles and PHN. Protection stays above 85% for at least the first four years after you get vaccinated. Shingrix is the preferred vaccine, over Zostavax (zoster vaccine live), a shingles vaccine in use since 2006. Zostavax may still be used to prevent shingles in healthy adults 60 years and older. For example, you could use Zostavax if a person is  allergic to Shingrix, prefers Zostavax, or requests immediate vaccination and Shingrix is unavailable. Who Should Get Shingrix? Healthy adults 50 years and older should get two doses of Shingrix, separated by 2 to 6 months. You should get Shingrix even if in the past you . had shingles  . received Zostavax  . are not sure if you had chickenpox There is no maximum age for getting  Shingrix. If you had shingles in the past, you can get Shingrix to help prevent future occurrences of the disease. There is no specific length of time that you need to wait after having shingles before you can receive Shingrix, but generally you should make sure the shingles rash has gone away before getting vaccinated. You can get Shingrix whether or not you remember having had chickenpox in the past. Studies show that more than 99% of Americans 40 years and older have had chickenpox, even if they don't remember having the disease. Chickenpox and shingles are related because they are caused by the same virus (varicella zoster virus). After a person recovers from chickenpox, the virus stays dormant (inactive) in the body. It can reactivate years later and cause shingles. If you had Zostavax in the recent past, you should wait at least eight weeks before getting Shingrix. Talk to your healthcare provider to determine the best time to get Shingrix. Shingrix is available in [facility] and pharmacies. To find doctor's offices or pharmacies near you that offer the vaccine, visit HealthMap Vaccine FinderExternal. If you have questions about Shingrix, talk with your healthcare provider. Vaccine for Those 38 Years and Older  Shingrix reduces the risk of shingles and PHN by more than 90% in people 107 and older. CDC recommends the vaccine for healthy adults 17 and older.  Who Should Not Get Shingrix? You should not get Shingrix if you: . have ever had a severe allergic reaction to any component of the vaccine or after a dose of Shingrix  . tested negative for immunity to varicella zoster virus. If you test negative, you should get chickenpox vaccine.  . currently have shingles  . currently are pregnant or breastfeeding. Women who are pregnant or breastfeeding should wait to get Shingrix.  Marland Kitchen receive specific antiviral drugs (acyclovir, famciclovir, or valacyclovir) 24 hours before vaccination (avoid use of  these antiviral drugs for 14 days after vaccination)- zoster vaccine live only If you have a minor acute (starts suddenly) illness, such as a cold, you may get Shingrix. But if you have a moderate or severe acute illness, you should usually wait until you recover before getting the vaccine. This includes anyone with a temperature of 101.24F or higher. The side effects of the Shingrix are temporary, and usually last 2 to 3 days. While you may experience pain for a few days after getting Shingrix, the pain will be less severe than having shingles and the complications from the disease. How Well Does Shingrix Work? Two doses of Shingrix provides strong protection against shingles and postherpetic neuralgia (PHN), the most common complication of shingles. . In adults 4 to 69 years old who got two doses, Shingrix was 97% effective in preventing shingles; among adults 70 years and older, Shingrix was 91% effective.  . In adults 28 to 69 years old who got two doses, Shingrix was 91% effective in preventing PHN; among adults 70 years and older, Shingrix was 89% effective. Shingrix protection remained high (more than 85%) in people 70 years and older throughout the four years following vaccination. Since your  risk of shingles and PHN increases as you get older, it is important to have strong protection against shingles in your older years. Top of Page  What Are the Possible Side Effects of Shingrix? Studies show that Shingrix is safe. The vaccine helps your body create a strong defense against shingles. As a result, you are likely to have temporary side effects from getting the shots. The side effects may affect your ability to do normal daily activities for 2 to 3 days. Most people got a sore arm with mild or moderate pain after getting Shingrix, and some also had redness and swelling where they got the shot. Some people felt tired, had muscle pain, a headache, shivering, fever, stomach pain, or nausea. About 1  out of 6 people who got Shingrix experienced side effects that prevented them from doing regular activities. Symptoms went away on their own in about 2 to 3 days. Side effects were more common in younger people. You might have a reaction to the first or second dose of Shingrix, or both doses. If you experience side effects, you may choose to take over-the-counter pain medicine such as ibuprofen or acetaminophen. If you experience side effects from Shingrix, you should report them to the Vaccine Adverse Event Reporting System (VAERS). Your doctor might file this report, or you can do it yourself through the VAERS websiteExternal, or by calling [phone]. If you have any questions about side effects from Shingrix, talk with your doctor. The shingles vaccine does not contain thimerosal (a preservative containing mercury). Top of Page  When Should I See a Doctor Because of the Side Effects I Experience From Shingrix? In clinical trials, Shingrix was not associated with serious adverse events. In fact, serious side effects from vaccines are extremely rare. For example, for every 1 million doses of a vaccine given, only one or two people may have a severe allergic reaction. Signs of an allergic reaction happen within minutes or hours after vaccination and include hives, swelling of the face and throat, difficulty breathing, a fast heartbeat, dizziness, or weakness. If you experience these or any other life-threatening symptoms, see a doctor right away. Shingrix causes a strong response in your immune system, so it may produce short-term side effects more intense than you are used to from other vaccines. These side effects can be uncomfortable, but they are expected and usually go away on their own in 2 or 3 days. Top of Page  How Can I Pay For Shingrix? There are several ways shingles vaccine may be paid for: Medicare . Medicare Part D plans cover the shingles vaccine, but there may be a cost to you  depending on your plan. There may be a copay for the vaccine, or you may need to pay in full then get reimbursed for a certain amount.  . Medicare Part B does not cover the shingles vaccine. Medicaid . Medicaid may or may not cover the vaccine. Contact your insurer to find out. Private health insurance . Many private health insurance plans will cover the vaccine, but there may be a cost to you depending on your plan. Contact your insurer to find out. Vaccine assistance programs . Some pharmaceutical companies provide vaccines to eligible adults who cannot afford them. You may want to check with the vaccine manufacturer, [employer], about Shingrix. If you do not currently have health insurance, learn more about affordable health coverage optionsExternal. To find doctor's offices or pharmacies near you that offer the vaccine, visit HealthMap Vaccine FinderExternal.

## 2020-08-24 DIAGNOSIS — G4733 Obstructive sleep apnea (adult) (pediatric): Secondary | ICD-10-CM | POA: Diagnosis not present

## 2020-08-26 LAB — CBC WITH DIFFERENTIAL/PLATELET
Absolute Monocytes: 510 cells/uL (ref 200–950)
Basophils Absolute: 60 cells/uL (ref 0–200)
Basophils Relative: 0.8 %
Eosinophils Absolute: 90 cells/uL (ref 15–500)
Eosinophils Relative: 1.2 %
HCT: 47.8 % (ref 38.5–50.0)
Hemoglobin: 16.6 g/dL (ref 13.2–17.1)
Lymphs Abs: 2820 cells/uL (ref 850–3900)
MCH: 33.4 pg — ABNORMAL HIGH (ref 27.0–33.0)
MCHC: 34.7 g/dL (ref 32.0–36.0)
MCV: 96.2 fL (ref 80.0–100.0)
MPV: 11.3 fL (ref 7.5–12.5)
Monocytes Relative: 6.8 %
Neutro Abs: 4020 cells/uL (ref 1500–7800)
Neutrophils Relative %: 53.6 %
Platelets: 175 10*3/uL (ref 140–400)
RBC: 4.97 10*6/uL (ref 4.20–5.80)
RDW: 12.7 % (ref 11.0–15.0)
Total Lymphocyte: 37.6 %
WBC: 7.5 10*3/uL (ref 3.8–10.8)

## 2020-08-26 LAB — COMPLETE METABOLIC PANEL WITH GFR
AG Ratio: 1.2 (calc) (ref 1.0–2.5)
ALT: 19 U/L (ref 9–46)
AST: 18 U/L (ref 10–35)
Albumin: 4 g/dL (ref 3.6–5.1)
Alkaline phosphatase (APISO): 63 U/L (ref 35–144)
BUN/Creatinine Ratio: 18 (calc) (ref 6–22)
BUN: 25 mg/dL (ref 7–25)
CO2: 29 mmol/L (ref 20–32)
Calcium: 10.5 mg/dL — ABNORMAL HIGH (ref 8.6–10.3)
Chloride: 102 mmol/L (ref 98–110)
Creat: 1.36 mg/dL — ABNORMAL HIGH (ref 0.70–1.25)
GFR, Est African American: 61 mL/min/{1.73_m2} (ref >=60)
GFR, Est Non African American: 53 mL/min/{1.73_m2} — ABNORMAL LOW (ref >=60)
Globulin: 3.3 g/dL (calc) (ref 1.9–3.7)
Glucose, Bld: 81 mg/dL (ref 65–99)
Potassium: 5.1 mmol/L (ref 3.5–5.3)
Sodium: 139 mmol/L (ref 135–146)
Total Bilirubin: 0.5 mg/dL (ref 0.2–1.2)
Total Protein: 7.3 g/dL (ref 6.1–8.1)

## 2020-08-26 LAB — C-REACTIVE PROTEIN: CRP: 2.3 mg/L (ref <8.0)

## 2020-08-26 LAB — VITAMIN B1, WHOLE BLOOD: Vitamin B1 (Thiamine), Blood: 151 nmol/L (ref 78–185)

## 2020-08-26 LAB — RPR: RPR Ser Ql: NONREACTIVE

## 2020-08-26 LAB — LIPID PANEL
Cholesterol: 212 mg/dL — ABNORMAL HIGH (ref <200)
HDL: 33 mg/dL — ABNORMAL LOW (ref >=40)
LDL Cholesterol (Calc): 132 mg/dL (calc) — ABNORMAL HIGH
Non-HDL Cholesterol (Calc): 179 mg/dL (calc) — ABNORMAL HIGH (ref <130)
Total CHOL/HDL Ratio: 6.4 (calc) — ABNORMAL HIGH (ref <5.0)
Triglycerides: 329 mg/dL — ABNORMAL HIGH (ref <150)

## 2020-08-26 LAB — B. BURGDORFI ANTIBODIES: B burgdorferi Ab IgG+IgM: <0.9 index

## 2020-08-26 LAB — ZINC: Zinc: 77 ug/dL (ref 60–130)

## 2020-08-26 LAB — VITAMIN B12: Vitamin B-12: 517 pg/mL (ref 200–1100)

## 2020-08-26 LAB — TSH: TSH: 0.86 mIU/L (ref 0.40–4.50)

## 2020-08-26 LAB — HEMOGLOBIN A1C
Hgb A1c MFr Bld: 5.6 % of total Hgb (ref <5.7)
Mean Plasma Glucose: 114 (calc)
eAG (mmol/L): 6.3 (calc)

## 2020-08-26 LAB — ANA: Anti Nuclear Antibody (ANA): NEGATIVE

## 2020-08-27 DIAGNOSIS — R69 Illness, unspecified: Secondary | ICD-10-CM | POA: Diagnosis not present

## 2020-09-07 ENCOUNTER — Other Ambulatory Visit: Payer: Self-pay | Admitting: Adult Health

## 2020-09-10 DIAGNOSIS — G4733 Obstructive sleep apnea (adult) (pediatric): Secondary | ICD-10-CM | POA: Diagnosis not present

## 2020-09-12 ENCOUNTER — Other Ambulatory Visit: Payer: Self-pay | Admitting: Internal Medicine

## 2020-09-12 DIAGNOSIS — G609 Hereditary and idiopathic neuropathy, unspecified: Secondary | ICD-10-CM

## 2020-09-23 DIAGNOSIS — G4733 Obstructive sleep apnea (adult) (pediatric): Secondary | ICD-10-CM | POA: Diagnosis not present

## 2020-09-27 ENCOUNTER — Ambulatory Visit: Payer: Medicare HMO | Attending: Internal Medicine

## 2020-09-27 DIAGNOSIS — Z23 Encounter for immunization: Secondary | ICD-10-CM

## 2020-09-27 NOTE — Progress Notes (Signed)
   Covid-19 Vaccination Clinic  Name:  Stephen Holland    MRN: [id number] DOB: 14-Jan-1951  09/27/2020  [name] was observed post Covid-19 immunization for 15 minutes without incident. He was provided with Vaccine Information Sheet and instruction to access the V-Safe system.   [name] was instructed to call 911 with any severe reactions post vaccine: Marland Kitchen Difficulty breathing  . Swelling of face and throat  . A fast heartbeat  . A bad rash all over body  . Dizziness and weakness

## 2020-09-29 ENCOUNTER — Other Ambulatory Visit: Payer: Self-pay | Admitting: Internal Medicine

## 2020-10-03 DIAGNOSIS — R69 Illness, unspecified: Secondary | ICD-10-CM | POA: Diagnosis not present

## 2020-10-11 DIAGNOSIS — G4733 Obstructive sleep apnea (adult) (pediatric): Secondary | ICD-10-CM | POA: Diagnosis not present

## 2020-10-27 DIAGNOSIS — G4733 Obstructive sleep apnea (adult) (pediatric): Secondary | ICD-10-CM | POA: Diagnosis not present

## 2020-11-05 DIAGNOSIS — H35033 Hypertensive retinopathy, bilateral: Secondary | ICD-10-CM | POA: Diagnosis not present

## 2020-11-05 DIAGNOSIS — H2513 Age-related nuclear cataract, bilateral: Secondary | ICD-10-CM | POA: Diagnosis not present

## 2020-11-10 DIAGNOSIS — G4733 Obstructive sleep apnea (adult) (pediatric): Secondary | ICD-10-CM | POA: Diagnosis not present

## 2020-11-26 DIAGNOSIS — G4733 Obstructive sleep apnea (adult) (pediatric): Secondary | ICD-10-CM | POA: Diagnosis not present

## 2020-12-02 ENCOUNTER — Encounter: Payer: Self-pay | Admitting: Internal Medicine

## 2020-12-02 NOTE — Patient Instructions (Signed)

## 2020-12-02 NOTE — Progress Notes (Addendum)
Annual  Screening/Preventative Visit  & Comprehensive Evaluation & Examination      This very nice 70 y.o.  MWM presents for a Screening /Preventative Visit & comprehensive evaluation and management of multiple medical co-morbidities.  Patient has been followed for HTN, HLD, Prediabetes and Vitamin D Deficiency.  Patient has Coronary Artery & Aortic Atherosclerosis by Chest CT scan on 16 Nov 2019. Patient's Gout is controlled with Allopurinol.  Patient has hx/o mild peripheral neuropathy evaluated by Dr Leta Baptist and fet non-compromising of his ability to drive.       Patient has COPD (52 pk yr smoker) and OSA on CPAP and reports his compliance is 100 % usage. Patient reports using CPAP at 14 cm pressure for over 10 years. Patient reports improved sleep hygiene and less day time excessive sleepiness.     HTN predates since the 1980's. Patient's BP has been controlled at home. 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  Today's BP: 138/82. Patient denies any cardiac symptoms as chest pain, palpitations, shortness of breath, dizziness or ankle swelling.      Patient's hyperlipidemia is controlled with diet and medications. Patient denies myalgias or other medication SE's. Last lipids were not at goal:  Lab Results  Component Value Date   CHOL 212 (H) 08/21/2020   HDL 33 (L) 08/21/2020   LDLCALC 132 (H) 08/21/2020   TRIG 329 (H) 08/21/2020   CHOLHDL 6.4 (H) 08/21/2020        Patient has Moderate Obesity  (BMI 32+) and  hx/o prediabetes (A1c 5.7% /2011)  and patient denies reactive hypoglycemic symptoms, visual blurring, diabetic polys or paresthesias. Last A1c  With weight loss was at goal:   Lab Results  Component Value Date   HGBA1C 5.6 08/21/2020         Finally, patient has history of Vitamin D Deficiency ("12" /2009) and last vitamin D was at goal:   Lab Results  Component Value Date   VD25OH 76 05/21/2020    Current  Outpatient Medications on File Prior to Visit  Medication Sig  . allopurinol (ZYLOPRIM) 300 MG tablet TAKE 1 TABLET BY MOUTH DAILY TO PREVENT GOUT  . bisoprolol-hydrochlorothiazide (ZIAC) 10-6.25 MG tablet Take    1 tablet     Daily     for BP  . Cholecalciferol (VITAMIN D3) 125 MCG (5000 UT) CAPS Take 10,000 Units by mouth daily.  . clonazePAM (KLONOPIN) 1 MG tablet TAKE 1/2 TO 1 TABLET AT BEDTIME ONLY IF NEEDED & TRY TO LIMIT TO 5 DAYS /WEEK  . gabapentin (NEURONTIN) 600 MG tablet Take      1/2 to 1 tablet       2 to 3 x /Daily       as needed for Neuropathy Pain  . KRILL OIL PO Take 700 mg by mouth daily.   . Magnesium 400 MG CAPS Take 800 mg by mouth daily.   . meloxicam (MOBIC) 15 MG tablet Take 1 tablet Daily with Food for Pain & Inflammation  . rosuvastatin (CRESTOR) 40 MG tablet Take 1 tablet Daily for Cholesterol  . telmisartan (MICARDIS) 80 MG tablet Take 1 tablet Daily for BP    No Known Allergies  Past Medical History:  Diagnosis Date  . Colon polyps   . DJD (degenerative joint disease)   . ED (erectile dysfunction)   . Gout   . Hyperlipemia   . Hypertension   . Obstructive sleep  apnea    08/2012, cpap setting of 14  . Prostate cancer ([place]) 01/2002   s/p prostatectomy  . Vitamin D deficiency    Health Maintenance  Topic Date Due  . COLONOSCOPY (Pts 45-43yrs Insurance coverage will need to be confirmed)  09/06/2026  . TETANUS/TDAP  08/21/2030  . INFLUENZA VACCINE  Completed  . COVID-19 Vaccine  Completed  . Hepatitis C Screening  Completed  . PNA vac Low Risk Adult  Completed   Immunization History  Administered Date(s) Administered  . Influenza Split 08/26/2014  . Influenza, High Dose Seasonal PF 08/12/2016, 10/17/2018, 09/05/2019  . Influenza, Seasonal, Injecte, Preservative Fre 10/27/2015  . PFIZER SARS-COV-2 Vaccination 01/05/2020, 01/26/2020, 09/27/2020  . PPD Test 05/23/2014, 07/23/2015  . Pneumococcal Conjugate-13 08/26/2014  . Pneumococcal  Polysaccharide-23 05/05/2010, 01/30/2016  . Td 11/30/2003  . Tdap 08/12/2010, 08/21/2020  . Zoster 04/29/2013   Last Colon -  09/06/2016 - Dr Mellody Memos - recc 10 yr f/u - due Oct 2027.   Past Surgical History:  Procedure Laterality Date  . COLONOSCOPY WITH PROPOFOL N/A 09/06/2016   Procedure: COLONOSCOPY WITH PROPOFOL;  Surgeon: Garlan Fair, MD;  Location: [facility];  Service: Endoscopy;  Laterality: N/A;  . colonscopy     x 3 with polyp one time  . PROSTATECTOMY  2003  . SKIN CANCER EXCISION     Family History  Problem Relation Age of Onset  . Hypertension Mother   . Pulmonary fibrosis Mother   . Stroke Father   . Heart attack Maternal Grandmother   . Stroke Maternal Grandfather   . Stroke Paternal Grandmother   . Colon cancer Paternal Grandfather    Social History   Socioeconomic History  . Marital status: Married    Spouse name: [name]  . Number of children: 2  Occupational History    Comment: truck driver  Tobacco Use  . Smoking status: Current Every Day Smoker    Packs/day: 1.00    Years: 45.00    Pack years: 45.00    Types: Cigarettes  . Smokeless tobacco: Never Used  Vaping Use  . Vaping Use: Never used  Substance and Sexual Activity  . Alcohol use: Yes    Alcohol/week: 15.0 standard drinks    Types: 15 Standard drinks or equivalent per week    Comment: 03/31/20  2-3 mixed drinks per day  . Drug use: No  . Sexual activity: Not on file  Social History Narrative   Lives home with wife. Retired.  Education HS.  Children 2     Caffeine 4 cups daily and one coke.     ROS Constitutional: Denies fever, chills, weight loss/gain, headaches, insomnia,  night sweats or change in appetite. Does c/o fatigue. Eyes: Denies redness, blurred vision, diplopia, discharge, itchy or watery eyes.  ENT: Denies discharge, congestion, post nasal drip, epistaxis, sore throat, earache, hearing loss, dental pain, Tinnitus, Vertigo, Sinus pain or snoring.  Cardio: Denies  chest pain, palpitations, irregular heartbeat, syncope, dyspnea, diaphoresis, orthopnea, PND, claudication or edema Respiratory: denies cough, dyspnea, DOE, pleurisy, hoarseness, laryngitis or wheezing.  Gastrointestinal: Denies dysphagia, heartburn, reflux, water brash, pain, cramps, nausea, vomiting, bloating, diarrhea, constipation, hematemesis, melena, hematochezia, jaundice or hemorrhoids Genitourinary: Denies dysuria, frequency, discharge, hematuria or flank pain. Has urgency, nocturia x 2-3 & occasional hesitancy. Musculoskeletal: Denies arthralgia, myalgia, stiffness, Jt. Swelling, pain, limp or strain/sprain. Denies Falls. Skin: Denies puritis, rash, hives, warts, acne, eczema or change in skin lesion Neuro: No weakness, tremor, incoordination, spasms, paresthesia or  pain Psychiatric: Denies confusion, memory loss or sensory loss. Denies Depression. Endocrine: Denies change in weight, skin, hair change, nocturia, and paresthesia, diabetic polys, visual blurring or hyper / hypo glycemic episodes.  Heme/Lymph: No excessive bleeding, bruising or enlarged lymph nodes.  Physical Exam  BP 138/82   Pulse 64   Temp (!) 97.1 F (36.2 C)   Resp 18   Ht 5' 6.5" (1.689 m)   Wt 207 lb 12.8 oz (94.3 kg)   SpO2 98%   BMI 33.04 kg/m   General Appearance: over nourished and well groomed and in no apparent distress.  Eyes: PERRLA, EOMs, conjunctiva no swelling or erythema, normal fundi and vessels. Sinuses: No frontal/maxillary tenderness ENT/Mouth: EACs patent / TMs  nl. Nares clear without erythema, swelling, mucoid exudates. Oral hygiene is good. No erythema, swelling, or exudate. Tongue normal, non-obstructing. Tonsils not swollen or erythematous. Hearing normal.  Neck: Supple, thyroid not palpable. No bruits, nodes or JVD. Respiratory: Respiratory effort normal.  BS equal and clear bilateral without rales, rhonci, wheezing or stridor. Cardio: Heart sounds are normal with regular rate and  rhythm and no murmurs, rubs or gallops. Peripheral pulses are normal and equal bilaterally without edema. No aortic or femoral bruits. Chest: symmetric with normal excursions and percussion.  Abdomen: Soft, obese with Nl bowel sounds. Nontender, no guarding, rebound, hernias, masses, or organomegaly.  Lymphatics: Non tender without lymphadenopathy.  Musculoskeletal: Full ROM all peripheral extremities, joint stability, 5/5 strength, and normal gait. Skin: Warm and dry without rashes, lesions, cyanosis, clubbing or  ecchymosis.  Neuro: Cranial nerves intact, reflexes equal bilaterally. Normal muscle tone, no cerebellar symptoms. Sensation intact to touch, vibratory and Monofilament to the toes bilaterally. Pysch: Alert and oriented X 3 with normal affect, insight and judgment appropriate.   Assessment and Plan  1. Annual Preventative/Screening Exam    2. Essential hypertension  - EKG 12-Lead - [place], RETROPERITNL ABD,  LTD - Urinalysis, Routine w reflex microscopic - Microalbumin / creatinine urine ratio - CBC with Differential/Platelet - COMPLETE METABOLIC PANEL WITH GFR - Magnesium - TSH  3. Hyperlipidemia, mixed  - EKG 12-Lead - [place], RETROPERITNL ABD,  LTD - Lipid panel - TSH  4. Abnormal glucose  - EKG 12-Lead - [place], RETROPERITNL ABD,  LTD - Hemoglobin A1c - Insulin, random  5. Vitamin D deficiency  - VITAMIN D 25 Hydroxy   6. Aortic atherosclerosis ([facility]) by Chest CT on 12.18/2020  - EKG 12-Lead - [place], RETROPERITNL ABD,  LTD - Lipid panel  7. Idiopathic gout  - Uric acid  8. BPH with obstruction/lower urinary tract symptoms  - PSA  9. Chronic obstructive pulmonary disease (HCC)   10. OSA on CPAP (14 cm)   - Ref to Dr Vickey Huger for re-evaluation - last sleep[study > 10 years ago    11. Hereditary and idiopathic peripheral neuropathy   12. Screening for colorectal cancer  - POC Hemoccult Bld/Stl   13. Prostate cancer screening  - PSA  14.  Screening for ischemic heart disease  - EKG 12-Lead  15. FHx: heart disease  - EKG 12-Lead - [place], RETROPERITNL ABD,  LTD  16. Smoker  - EKG 12-Lead - [place], RETROPERITNL ABD,  LTD  17. Screening for AAA (aortic abdominal aneurysm)  - [place], RETROPERITNL ABD,  LTD  18. Medication management  - Urinalysis, Routine w reflex microscopic - Microalbumin / creatinine urine ratio - CBC with Differential/Platelet - COMPLETE METABOLIC PANEL WITH GFR - Magnesium - Lipid panel - TSH -  Hemoglobin A1c - Insulin, random - VITAMIN D 25 Hydroxy            Patient was counseled in prudent diet, weight control to achieve/maintain BMI less than 25, BP monitoring, regular exercise and medications as discussed.  Discussed med effects and SE's. Routine screening labs and tests as requested with regular follow-up as recommended. Over 40 minutes of exam, counseling, chart review and high complex critical decision making was performed   Kirtland Bouchard, MD

## 2020-12-03 ENCOUNTER — Encounter: Payer: Self-pay | Admitting: Internal Medicine

## 2020-12-03 ENCOUNTER — Ambulatory Visit (INDEPENDENT_AMBULATORY_CARE_PROVIDER_SITE_OTHER): Payer: Medicare Other | Admitting: Internal Medicine

## 2020-12-03 ENCOUNTER — Other Ambulatory Visit: Payer: Self-pay

## 2020-12-03 VITALS — BP 138/82 | HR 64 | Temp 97.1°F | Resp 18 | Ht 66.5 in | Wt 207.8 lb

## 2020-12-03 DIAGNOSIS — I700 Atherosclerosis of aorta: Secondary | ICD-10-CM | POA: Diagnosis not present

## 2020-12-03 DIAGNOSIS — Z9989 Dependence on other enabling machines and devices: Secondary | ICD-10-CM

## 2020-12-03 DIAGNOSIS — R7309 Other abnormal glucose: Secondary | ICD-10-CM | POA: Diagnosis not present

## 2020-12-03 DIAGNOSIS — Z0000 Encounter for general adult medical examination without abnormal findings: Secondary | ICD-10-CM

## 2020-12-03 DIAGNOSIS — E559 Vitamin D deficiency, unspecified: Secondary | ICD-10-CM | POA: Diagnosis not present

## 2020-12-03 DIAGNOSIS — M1000 Idiopathic gout, unspecified site: Secondary | ICD-10-CM

## 2020-12-03 DIAGNOSIS — Z136 Encounter for screening for cardiovascular disorders: Secondary | ICD-10-CM | POA: Diagnosis not present

## 2020-12-03 DIAGNOSIS — Z125 Encounter for screening for malignant neoplasm of prostate: Secondary | ICD-10-CM

## 2020-12-03 DIAGNOSIS — Z0001 Encounter for general adult medical examination with abnormal findings: Secondary | ICD-10-CM

## 2020-12-03 DIAGNOSIS — E782 Mixed hyperlipidemia: Secondary | ICD-10-CM

## 2020-12-03 DIAGNOSIS — Z79899 Other long term (current) drug therapy: Secondary | ICD-10-CM

## 2020-12-03 DIAGNOSIS — I10 Essential (primary) hypertension: Secondary | ICD-10-CM

## 2020-12-03 DIAGNOSIS — Z1211 Encounter for screening for malignant neoplasm of colon: Secondary | ICD-10-CM

## 2020-12-03 DIAGNOSIS — N138 Other obstructive and reflux uropathy: Secondary | ICD-10-CM

## 2020-12-03 DIAGNOSIS — G609 Hereditary and idiopathic neuropathy, unspecified: Secondary | ICD-10-CM

## 2020-12-03 DIAGNOSIS — F17200 Nicotine dependence, unspecified, uncomplicated: Secondary | ICD-10-CM

## 2020-12-03 DIAGNOSIS — N401 Enlarged prostate with lower urinary tract symptoms: Secondary | ICD-10-CM

## 2020-12-03 DIAGNOSIS — Z8249 Family history of ischemic heart disease and other diseases of the circulatory system: Secondary | ICD-10-CM

## 2020-12-03 DIAGNOSIS — J449 Chronic obstructive pulmonary disease, unspecified: Secondary | ICD-10-CM

## 2020-12-03 DIAGNOSIS — G4733 Obstructive sleep apnea (adult) (pediatric): Secondary | ICD-10-CM

## 2020-12-03 NOTE — Addendum Note (Signed)
Addended by: Lucky Cowboy on: 12/03/2020 04:33 PM   Modules accepted: Orders

## 2020-12-04 LAB — CBC WITH DIFFERENTIAL/PLATELET
Absolute Monocytes: 766 cells/uL (ref 200–950)
Basophils Absolute: 70 cells/uL (ref 0–200)
Basophils Relative: 0.8 %
Eosinophils Absolute: 185 cells/uL (ref 15–500)
Eosinophils Relative: 2.1 %
HCT: 49.1 % (ref 38.5–50.0)
Hemoglobin: 17.1 g/dL (ref 13.2–17.1)
Lymphs Abs: 2570 cells/uL (ref 850–3900)
MCH: 32.9 pg (ref 27.0–33.0)
MCHC: 34.8 g/dL (ref 32.0–36.0)
MCV: 94.4 fL (ref 80.0–100.0)
MPV: 11.7 fL (ref 7.5–12.5)
Monocytes Relative: 8.7 %
Neutro Abs: 5210 cells/uL (ref 1500–7800)
Neutrophils Relative %: 59.2 %
Platelets: 149 10*3/uL (ref 140–400)
RBC: 5.2 10*6/uL (ref 4.20–5.80)
RDW: 12.7 % (ref 11.0–15.0)
Total Lymphocyte: 29.2 %
WBC: 8.8 10*3/uL (ref 3.8–10.8)

## 2020-12-04 LAB — URINALYSIS, ROUTINE W REFLEX MICROSCOPIC
Bacteria, UA: NONE SEEN /HPF
Bilirubin Urine: NEGATIVE
Glucose, UA: NEGATIVE
Hgb urine dipstick: NEGATIVE
Hyaline Cast: NONE SEEN /LPF
Ketones, ur: NEGATIVE
Leukocytes,Ua: NEGATIVE
Nitrite: NEGATIVE
RBC / HPF: NONE SEEN /HPF (ref 0–2)
Specific Gravity, Urine: 1.014 (ref 1.001–1.03)
Squamous Epithelial / HPF: NONE SEEN /HPF (ref <=5)
WBC, UA: NONE SEEN /HPF (ref 0–5)
pH: <=5 (ref 5.0–8.0)

## 2020-12-04 LAB — COMPLETE METABOLIC PANEL WITH GFR
AG Ratio: 1.7 (calc) (ref 1.0–2.5)
ALT: 22 U/L (ref 9–46)
AST: 24 U/L (ref 10–35)
Albumin: 4.3 g/dL (ref 3.6–5.1)
Alkaline phosphatase (APISO): 54 U/L (ref 35–144)
BUN/Creatinine Ratio: 25 (calc) — ABNORMAL HIGH (ref 6–22)
BUN: 32 mg/dL — ABNORMAL HIGH (ref 7–25)
CO2: 25 mmol/L (ref 20–32)
Calcium: 9.6 mg/dL (ref 8.6–10.3)
Chloride: 104 mmol/L (ref 98–110)
Creat: 1.27 mg/dL — ABNORMAL HIGH (ref 0.70–1.25)
GFR, Est African American: 66 mL/min/{1.73_m2} (ref >=60)
GFR, Est Non African American: 57 mL/min/{1.73_m2} — ABNORMAL LOW (ref >=60)
Globulin: 2.5 g/dL (calc) (ref 1.9–3.7)
Glucose, Bld: 79 mg/dL (ref 65–99)
Potassium: 4.6 mmol/L (ref 3.5–5.3)
Sodium: 139 mmol/L (ref 135–146)
Total Bilirubin: 0.5 mg/dL (ref 0.2–1.2)
Total Protein: 6.8 g/dL (ref 6.1–8.1)

## 2020-12-04 LAB — TSH: TSH: 1.49 mIU/L (ref 0.40–4.50)

## 2020-12-04 LAB — MICROALBUMIN / CREATININE URINE RATIO
Creatinine, Urine: 59 mg/dL (ref 20–320)
Microalb Creat Ratio: 378 mcg/mg creat — ABNORMAL HIGH (ref <30)
Microalb, Ur: 22.3 mg/dL

## 2020-12-04 LAB — LIPID PANEL
Cholesterol: 149 mg/dL (ref <200)
HDL: 38 mg/dL — ABNORMAL LOW (ref >=40)
LDL Cholesterol (Calc): 81 mg/dL (calc)
Non-HDL Cholesterol (Calc): 111 mg/dL (calc) (ref <130)
Total CHOL/HDL Ratio: 3.9 (calc) (ref <5.0)
Triglycerides: 208 mg/dL — ABNORMAL HIGH (ref <150)

## 2020-12-04 LAB — HEMOGLOBIN A1C
Hgb A1c MFr Bld: 5.6 % of total Hgb (ref <5.7)
Mean Plasma Glucose: 114 mg/dL
eAG (mmol/L): 6.3 mmol/L

## 2020-12-04 LAB — INSULIN, RANDOM: Insulin: 7.8 u[IU]/mL

## 2020-12-04 LAB — VITAMIN D 25 HYDROXY (VIT D DEFICIENCY, FRACTURES): Vit D, 25-Hydroxy: 76 ng/mL (ref 30–100)

## 2020-12-04 LAB — URIC ACID: Uric Acid, Serum: 5.5 mg/dL (ref 4.0–8.0)

## 2020-12-04 LAB — MAGNESIUM: Magnesium: 1.9 mg/dL (ref 1.5–2.5)

## 2020-12-04 LAB — PSA: PSA: 0.32 ng/mL (ref <=4.0)

## 2020-12-04 NOTE — Progress Notes (Signed)
========================================================== -   Test results slightly outside the reference range are not unusual. If there is anything important, I will review this with you,  otherwise it is considered normal test values.  If you have further questions,  please do not hesitate to contact me at the office or via My Chart.  ==========================================================  -  PSA - very Low - Great ! ==========================================================  -  Uric Acid / Gout test is Normal & OK   - Please continue Allopurinol same  ==========================================================  -  Kidney Functions still Stage 3a & Stable - Great  ==========================================================  -  Total Chol = 149    & LDL Chol = 81       - Both Wonderful /  Excellent   - Much, Much Better !  - Very low risk for Heart Attack  / Stroke ==========================================================  - A1c - Normal - Great -   No Diabetes  ! ==========================================================  -  Vitamin D = 76 - Excellent  ! ==========================================================  -  Keep up the Get Work  ! ==========================================================

## 2020-12-10 ENCOUNTER — Other Ambulatory Visit: Payer: Self-pay | Admitting: Internal Medicine

## 2020-12-23 DIAGNOSIS — H2513 Age-related nuclear cataract, bilateral: Secondary | ICD-10-CM | POA: Diagnosis not present

## 2020-12-23 DIAGNOSIS — H25043 Posterior subcapsular polar age-related cataract, bilateral: Secondary | ICD-10-CM | POA: Diagnosis not present

## 2020-12-23 DIAGNOSIS — H2511 Age-related nuclear cataract, right eye: Secondary | ICD-10-CM | POA: Diagnosis not present

## 2020-12-23 DIAGNOSIS — H18413 Arcus senilis, bilateral: Secondary | ICD-10-CM | POA: Diagnosis not present

## 2020-12-23 DIAGNOSIS — H25013 Cortical age-related cataract, bilateral: Secondary | ICD-10-CM | POA: Diagnosis not present

## 2020-12-30 HISTORY — PX: CATARACT EXTRACTION, BILATERAL: SHX1313

## 2021-01-08 ENCOUNTER — Ambulatory Visit: Payer: Medicare Other | Admitting: Neurology

## 2021-01-08 ENCOUNTER — Encounter: Payer: Self-pay | Admitting: Neurology

## 2021-01-08 ENCOUNTER — Other Ambulatory Visit: Payer: Self-pay

## 2021-01-08 VITALS — BP 144/82 | HR 58 | Ht 67.0 in | Wt 210.0 lb

## 2021-01-08 DIAGNOSIS — Z024 Encounter for examination for driving license: Secondary | ICD-10-CM | POA: Diagnosis not present

## 2021-01-08 DIAGNOSIS — G629 Polyneuropathy, unspecified: Secondary | ICD-10-CM

## 2021-01-08 DIAGNOSIS — G4733 Obstructive sleep apnea (adult) (pediatric): Secondary | ICD-10-CM

## 2021-01-08 DIAGNOSIS — J449 Chronic obstructive pulmonary disease, unspecified: Secondary | ICD-10-CM | POA: Diagnosis not present

## 2021-01-08 DIAGNOSIS — I700 Atherosclerosis of aorta: Secondary | ICD-10-CM | POA: Diagnosis not present

## 2021-01-08 DIAGNOSIS — G4761 Periodic limb movement disorder: Secondary | ICD-10-CM | POA: Diagnosis not present

## 2021-01-08 DIAGNOSIS — Z9989 Dependence on other enabling machines and devices: Secondary | ICD-10-CM | POA: Diagnosis not present

## 2021-01-08 DIAGNOSIS — F17200 Nicotine dependence, unspecified, uncomplicated: Secondary | ICD-10-CM

## 2021-01-08 NOTE — Patient Instructions (Signed)
CPAP and BPAP Information CPAP and BPAP are methods that use air pressure to keep your airways open and to help you breathe well. CPAP and BPAP use different amounts of pressure. Your health care provider will tell you whether CPAP or BPAP would be more helpful for you.  CPAP stands for "continuous positive airway pressure." With CPAP, the amount of pressure stays the same while you breathe in and out.  BPAP stands for "bi-level positive airway pressure." With BPAP, the amount of pressure will be higher when you breathe in (inhale) and lower when you breathe out(exhale). This allows you to take larger breaths. CPAP or BPAP may be used in the hospital, or your health care provider may want you to use it at home. You may need to have a sleep study before your health care provider can order a machine for you to use at home. Why are CPAP and BPAP treatments used? CPAP or BPAP can be helpful if you have:  Sleep apnea.  Chronic obstructive pulmonary disease (COPD).  Heart failure.  Medical conditions that cause muscle weakness, including muscular dystrophy or amyotrophic lateral sclerosis (ALS).  Other problems that cause breathing to be shallow, weak, abnormal, or difficult. CPAP and BPAP are most commonly used for obstructive sleep apnea (OSA) to keep the airways from collapsing when the muscles relax during sleep. How is CPAP or BPAP administered? Both CPAP and BPAP are provided by a small machine with a flexible plastic tube that attaches to a plastic mask that you wear. Air is blown through the mask into your nose or mouth. The amount of pressure that is used to blow the air can be adjusted on the machine. Your health care provider will set the pressure setting and help you find the best mask for you. When should CPAP or BPAP be used? In most cases, the mask only needs to be worn during sleep. Generally, the mask needs to be worn throughout the night and during any daytime naps. People with  certain medical conditions may also need to wear the mask at other times when they are awake. Follow instructions from your health care provider about when to use the machine. What are some tips for using the mask?  Because the mask needs to be snug, some people feel trapped or closed-in (claustrophobic) when first using the mask. If you feel this way, you may need to get used to the mask. One way to do this is to hold the mask loosely over your nose or mouth and then gradually apply the mask more snugly. You can also gradually increase the amount of time that you use the mask.  Masks are available in various types and sizes. If your mask does not fit well, talk with your health care provider about getting a different one. Some common types of masks include: ? Full face masks, which fit over the mouth and nose. ? Nasal masks, which fit over the nose. ? Nasal pillow or prong masks, which fit into the nostrils.  If you are using a mask that fits over your nose and you tend to breathe through your mouth, a chin strap may be applied to help keep your mouth closed.  Some CPAP and BPAP machines have alarms that may sound if the mask comes off or develops a leak.  If you have trouble with the mask, it is very important that you talk with your health care provider about finding a way to make the mask easier to   tolerate. Do not stop using the mask. There could be a negative impact to your health if you stop using the mask.   What are some tips for using the machine?  Place your CPAP or BPAP machine on a secure table or stand near an electrical outlet.  Know where the on/off switch is on the machine.  Follow instructions from your health care provider about how to set the pressure on your machine and when you should use it.  Do not eat or drink while the CPAP or BPAP machine is on. Food or fluids could get pushed into your lungs by the pressure of the CPAP or BPAP.  For home use, CPAP and BPAP  machines can be rented or purchased through home health care companies. Many different brands of machines are available. Renting a machine before purchasing may help you find out which particular machine works well for you. Your insurance may also decide which machine you may get.  Keep the CPAP or BPAP machine and attachments clean. Ask your health care provider for specific instructions. Follow these instructions at home:  Do not use any products that contain nicotine or tobacco, such as cigarettes, e-cigarettes, and chewing tobacco. If you need help quitting, ask your health care provider.  Keep all follow-up visits as told by your health care provider. This is important. Contact a health care provider if:  You have redness or pressure sores on your head, face, mouth, or nose from the mask or head gear.  You have trouble using the CPAP or BPAP machine.  You cannot tolerate wearing the CPAP or BPAP mask.  Someone tells you that you snore even when wearing your CPAP or BPAP. Get help right away if:  You have trouble breathing.  You feel confused. Summary  CPAP and BPAP are methods that use air pressure to keep your airways open and to help you breathe well.  You may need to have a sleep study before your health care provider can order a machine for home use.  If you have trouble with the mask, it is very important that you talk with your health care provider about finding a way to make the mask easier to tolerate. Do not stop using the mask. There could be a negative impact to your health if you stop using the mask.  Follow instructions from your health care provider about when to use the machine. This information is not intended to replace advice given to you by your health care provider. Make sure you discuss any questions you have with your health care provider. Document Revised: 12/07/2019 Document Reviewed: 12/10/2019 Elsevier Patient Education  2021 [employer]. Chronic  Obstructive Pulmonary Disease  Chronic obstructive pulmonary disease (COPD) is a long-term (chronic) lung problem. When you have COPD, it is hard for air to get in and out of your lungs. Usually the condition gets worse over time, and your lungs will never return to normal. There are things you can do to keep yourself as healthy as possible. What are the causes?  Smoking. This is the most common cause.  Certain genes passed from parent to child (inherited). What increases the risk?  Being exposed to secondhand smoke from cigarettes, pipes, or cigars.  Being exposed to chemicals and other irritants, such as fumes and dust in the work environment.  Having chronic lung conditions or infections. What are the signs or symptoms?  Shortness of breath, especially during physical activity.  A long-term cough with a large  amount of thick mucus. Sometimes, the cough may not have any mucus (dry cough).  Wheezing.  Breathing quickly.  Skin that looks gray or blue, especially in the fingers, toes, or lips.  Feeling tired (fatigue).  Weight loss.  Chest tightness.  Having infections often.  Episodes when breathing symptoms become much worse (exacerbations). At the later stages of this disease, you may have swelling in the ankles, feet, or legs. How is this treated?  Taking medicines.  Quitting smoking, if you smoke.  Rehabilitation. This includes steps to make your body work better. It may involve a team of specialists.  Doing exercises.  Making changes to your diet.  Using oxygen.  Lung surgery.  Lung transplant.  Comfort measures (palliative care). Follow these instructions at home: Medicines  Take over-the-counter and prescription medicines only as told by your doctor.  Talk to your doctor before taking any cough or allergy medicines. You may need to avoid medicines that cause your lungs to be dry. Lifestyle  If you smoke, stop smoking. Smoking makes the problem  worse.  Do not smoke or use any products that contain nicotine or tobacco. If you need help quitting, ask your doctor.  Avoid being around things that make your breathing worse. This may include smoke, chemicals, and fumes.  Stay active, but remember to rest as well.  Learn and use tips on how to manage stress and control your breathing.  Make sure you get enough sleep. Most adults need at least 7 hours of sleep every night.  Eat healthy foods. Eat smaller meals more often. Rest before meals. Controlled breathing Learn and use tips on how to control your breathing as told by your doctor. Try:  Breathing in (inhaling) through your nose for 1 second. Then, pucker your lips and breath out (exhale) through your lips for 2 seconds.  Putting one hand on your belly (abdomen). Breathe in slowly through your nose for 1 second. Your hand on your belly should move out. Pucker your lips and breathe out slowly through your lips. Your hand on your belly should move in as you breathe out.   Controlled coughing Learn and use controlled coughing to clear mucus from your lungs. Follow these steps: 1. Lean your head a little forward. 2. Breathe in deeply. 3. Try to hold your breath for 3 seconds. 4. Keep your mouth slightly open while coughing 2 times. 5. Spit any mucus out into a tissue. 6. Rest and do the steps again 1 or 2 times as needed. General instructions  Make sure you get all the shots (vaccines) that your doctor recommends. Ask your doctor about a flu shot and a pneumonia shot.  Use oxygen therapy and pulmonary rehabilitation if told by your doctor. If you need home oxygen therapy, ask your doctor if you should buy a tool to measure your oxygen level (oximeter).  Make a COPD action plan with your doctor. This helps you to know what to do if you feel worse than usual.  Manage any other conditions you have as told by your doctor.  Avoid going outside when it is very hot, cold, or  humid.  Avoid people who have a sickness you can catch (contagious).  Keep all follow-up visits. Contact a doctor if:  You cough up more mucus than usual.  There is a change in the color or thickness of the mucus.  It is harder to breathe than usual.  Your breathing is faster than usual.  You have trouble sleeping.  You need to use your medicines more often than usual.  You have trouble doing your normal activities such as getting dressed or walking around the house. Get help right away if:  You have shortness of breath while resting.  You have shortness of breath that stops you from: ? Being able to talk. ? Doing normal activities.  Your chest hurts for longer than 5 minutes.  Your skin color is more blue than usual.  Your pulse oximeter shows that you have low oxygen for longer than 5 minutes.  You have a fever.  You feel too tired to breathe normally. These symptoms may represent a serious problem that is an emergency. Do not wait to see if the symptoms will go away. Get medical help right away. Call your local emergency services (911 in the [place] Do not drive yourself to the hospital. Summary  Chronic obstructive pulmonary disease (COPD) is a long-term lung problem.  The way your lungs work will never return to normal. Usually the condition gets worse over time. There are things you can do to keep yourself as healthy as possible.  Take over-the-counter and prescription medicines only as told by your doctor.  If you smoke, stop. Smoking makes the problem worse. This information is not intended to replace advice given to you by your health care provider. Make sure you discuss any questions you have with your health care provider. Document Revised: 09/23/2020 Document Reviewed: 09/23/2020 Elsevier Patient Education  2021 [employer].

## 2021-01-08 NOTE — Progress Notes (Signed)
SLEEP MEDICINE CLINIC    Provider:  Larey Seat, MD  Primary Care Physician:  [name], MD [address] [place] [address]     Referring Provider: [name], [place],  [place] [id number]          Chief Complaint according to patient   Patient presents with:    . New Patient (Initial Visit)     Pt alone, rm 11. Presents today to get set up with a new machine. Needed establishing with a Sleep MD. [facility]- was seen by Dr Gwenlyn Found.   Current CPAP set up 12/28/2013 through [facility] and now in need of new machine. Last SS 2013      HISTORY OF PRESENT ILLNESS:  Stephen Holland is a 70 - year- old Caucasian male patient seen here upon  referral by PCP, on 01/08/2021 for a new sleep evaluation.  he is an active pateint of Dr. Leta Baptist.  Chief concern according to patient :  " The machine is old, can't adjust nothing, all water is gone in AM. "   I have the pleasure of seeing Stephen Holland today, a right-handed White or Caucasian male with a OSA and COPD overlap disorder.  he  has a past medical history of Colon polyps, DJD (degenerative joint disease), ED (erectile dysfunction), Gouty arthritis, nasal obstruction, congestion,  Hyperlipemia ( cholesterol and triglyceride) , Hypertension, active tobacco user, chronic dysphonia, Obstructive sleep apnea, Prostate cancer ([place]) (01/2002), Neuropathy on Gabapentin- exam for  DOT, and Vitamin D deficiency..   The patient had the first sleep study in the year 2012 and the study records are not available here.  Sleep relevant medical history:  hyperkalemia, snoring, hearing loss.    Family medical /sleep history: No  other family member on CPAP with OSA, insomnia, sleep walkers.    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  He lives with spouse and one dog. The patient currently works 2 days a week.    Tobacco use; 50 pack year, wife and he relapsed in November after quitting for 8 month.  ETOH use ;  Bourbon, Vodka ,  Caffeine intake in form of Coffee( 6-8 cups ) Soda( cola - 2-3 week) Tea ( 2 glasses at dinner) or energy drinks. Regular exercise in form of working- no program. .     Sleep habits are as follows: The patient's dinner time is between 5 PM. The patient goes to bed at 8.30 PM and falls asleep by 9-10 Pm continues to sleep for intervals of 3-5 hours, wakes for one bathroom breaks. The preferred sleep position is sideways and prone  , with the support of  pillows.  Dreams are reportedly infrequent.  6  AM is the usual rise time. The patient wakes up spontaneously.  He reports not feeling refreshed or restored in AM if he has not used  CPAP , CPAP dependent -    History of CPAP compliance: Stephen Holland was seen by his primary care physician on 03 December 2020 he has a history of COPD and OSA on CPAP and had a compliance of 100% due to today's report.  He has been using 14 cmH2O pressure for over 10 years.  Hypertension diagnosis is present at least since the 1980s he had a exam with Dr. Alvester Chou at cardiology last year and there was no  evidence of obstructive coronary artery disease, he does not have chest pain palpitations shortness of breath but he does have dysphonia.  He has facial erythema, some spine NAD.  His cholesterol was high his triglycerides were high his HbA1c was 5.6 in September last year vitamin D was at goal in June 2021 he must have also gouty arthritis as he is on allopurinol.  He uses clonazepam and gabapentin for neuropathy also magnesium for restless legs or cramping, meloxicam Crestor and my card is a listed.  His very old CPAP machine is an Therapist, sports with a serial #[id number] set at 14 cmH2O pressure was 2 cm expiratory pressure relief his residual AHI is 3.6/h as I do not know his baseline I still consider this a good resolution there are only  obstructive apneas remaining in form of hypopneas and just more shallow breathing may be related to hypoventilation or COPD.  I do not have data about oxygenation available but his compliance is excellent with 9 hours 26 minutes a day at 100% of the time.    Review of Systems: Out of a complete 14 system review, the patient complains of only the following symptoms, and all other reviewed systems are negative.:  Fatigue, sleepiness , snoring, dysphonia. Mouth breather.    How likely are you to doze in the following situations: 0 = not likely, 1 = slight chance, 2 = moderate chance, 3 = high chance   Sitting and Reading? Watching Television? Sitting inactive in a public place (theater or meeting)? As a passenger in a car for an hour without a break? Lying down in the afternoon when circumstances permit? Sitting and talking to someone? Sitting quietly after lunch without alcohol? In a car, while stopped for a few minutes in traffic?   Total = 2/ 24 points on CPAP>   FSS endorsed at 12/ 63 points.  GDS at 2/ 15 points   Social History   Socioeconomic History  . Marital status: Married    Spouse name: [name]  . Number of children: 2  . Years of education: 62  . Highest education level: Not on file  Occupational History    Comment: truck driver  Tobacco Use  . Smoking status: Current Every Day Smoker    Packs/day: 1.00    Years: 45.00    Pack years: 45.00    Types: Cigarettes  . Smokeless tobacco: Never Used  Vaping Use  . Vaping Use: Never used  Substance and Sexual Activity  . Alcohol use: Yes    Alcohol/week: 15.0 standard drinks    Types: 15 Standard drinks or equivalent per week    Comment: 03/31/20  2-3 mixed drinks per day  . Drug use: No  . Sexual activity: Not on file  Other Topics Concern  . Not on file  Social History Narrative   Lives home with wife. Retired.  Education HS.  Children 2     Caffeine 4 cups daily and one coke.   Social Determinants of Health    Financial Resource Strain: Not on file  Food Insecurity: Not on file  Transportation Needs: Not on file  Physical Activity: Not on file  Stress: Not on file  Social Connections: Not on file    Family History  Problem Relation Age of Onset  . Hypertension Mother   . Pulmonary fibrosis Mother   . Stroke Father   . Heart attack Maternal Grandmother   . Stroke Maternal Grandfather   . Stroke  Paternal Grandmother   . Colon cancer Paternal Grandfather     Past Medical History:  Diagnosis Date  . Colon polyps   . DJD (degenerative joint disease)   . ED (erectile dysfunction)   . Gout   . Hyperlipemia   . Hypertension   . Obstructive sleep apnea    08/2012, cpap setting of 14  . Prostate cancer ([place]) 01/2002   s/p prostatectomy  . Vitamin D deficiency     Past Surgical History:  Procedure Laterality Date  . COLONOSCOPY WITH PROPOFOL N/A 09/06/2016   Procedure: COLONOSCOPY WITH PROPOFOL;  Surgeon: Garlan Fair, MD;  Location: [facility] END[facility]COPY;  Service: Endoscopy;  Laterality: N/A;  . colonscopy     x 3 with polyp one time  . PROSTATECTOMY  2003  . SKIN CANCER EXCISION       Current Outpatient Medications on File Prior to Visit  Medication Sig Dispense Refill  . allopurinol (ZYLOPRIM) 300 MG tablet TAKE 1 TABLET BY MOUTH DAILY TO PREVENT GOUT 90 tablet 3  . bisoprolol-hydrochlorothiazide (ZIAC) 10-6.25 MG tablet Take    1 tablet     Daily     for BP 90 tablet 0  . Cholecalciferol (VITAMIN D3) 125 MCG (5000 UT) CAPS Take 10,000 Units by mouth daily.    . clonazePAM (KLONOPIN) 1 MG tablet TAKE 1/2 TO 1 TABLET AT BEDTIME ONLY IF NEEDED & TRY TO LIMIT TO 5 DAYS /WEEK 90 tablet 0  . gabapentin (NEURONTIN) 600 MG tablet Take      1/2 to 1 tablet       2 to 3 x /Daily       as needed for Neuropathy Pain 270 tablet 0  . KRILL OIL PO Take 700 mg by mouth daily.     . Magnesium 400 MG CAPS Take 800 mg by mouth daily.     . meloxicam (MOBIC) 15 MG tablet Take 1 tablet Daily with  Food for Pain & Inflammation 90 tablet 3  . rosuvastatin (CRESTOR) 40 MG tablet TAKE 1 TABLET BY MOUTH EVERY DAY FOR CHOLESTEROL 90 tablet 3  . telmisartan (MICARDIS) 80 MG tablet Take 1 tablet Daily for BP 90 tablet 3   No current facility-administered medications on file prior to visit.    No Known Allergies  Physical exam:  Today's Vitals   01/08/21 1007  BP: (!) 144/82  Pulse: (!) 58  Weight: 210 lb (95.3 kg)  Height: 5\' 7"  (1.702 m)   Body mass index is 32.89 kg/m.   Wt Readings from Last 3 Encounters:  01/08/21 210 lb (95.3 kg)  12/03/20 207 lb 12.8 oz (94.3 kg)  08/21/20 207 lb (93.9 kg)     Ht Readings from Last 3 Encounters:  01/08/21 5\' 7"  (1.702 m)  12/03/20 5' 6.5" (1.689 m)  08/21/20 5\' 7"  (1.702 m)      General: The patient is awake, alert and appears not in acute distress.  The patient is well groomed. Head: Normocephalic, atraumatic. Neck is supple. Mallampati 3 plus , peaked palate.   neck circumference: 21 inches . Nasal airflow barely patent- habitual mouth breather .   Retrognathia is mild  . Facial hair.  Dental status: biological teeth, crowded.  Cardiovascular:  Regular rate and cardiac rhythm by pulse,  without distended neck veins. Respiratory: Lungs are clear to auscultation.  Skin:  Without evidence of ankle edema, or rash. Trunk: The patient's posture is erect.   Neurologic exam : The patient is awake  and alert, oriented to place and time.   Memory subjective described as intact.  Attention span & concentration ability appears normal.  Speech is fluent,  with dysphonia . Mood and affect are appropriate.   Cranial nerves: no loss of smell or taste reported  Pupils are equal and briskly reactive to light. Funduscopic exam deferred. Right over left cataract.   Extraocular movements in vertical and horizontal planes were intact and without nystagmus.  No Diplopia. Visual fields by finger perimetry are intact. Hearing was intact to soft  voice and finger rubbing.    Facial sensation intact to fine touch.  Facial motor strength is symmetric and tongue and uvula move midline.  Neck ROM : rotation, tilt and flexion extension were normal for age and shoulder shrug was symmetrical.    Motor exam:  Symmetric bulk, tone and ROM.   Normal tone without cog-wheeling, symmetric grip strength .   Sensory:  Fine touch, pinprick and vibration were tested  and  normal.  Proprioception tested in the upper extremities was normal.   Coordination: Rapid alternating movements in the fingers/hands were of normal speed.  The Finger-to-nose maneuver was intact without evidence of ataxia, dysmetria or tremor.   Gait and station: Patient could rise unassisted from a seated position.  Stance is of normal width/ base .  Toe and heel walk were deferred.  Deep tendon reflexes: in the  upper and lower extremities are symmetric and intact.  Babinski response was deferred.       After spending a total time of  45  minutes face to face and additional time for physical and neurologic examination, review of laboratory studies, personal review of imaging studies, reports and results of other testing and review of referral information / records as far as provided in visit, I have established the following assessments:  1) OSA history with COPD overlap, long time CPAP user, highly compliant.  He has a history of PLMs and RLS.  2)  DOT related need for ongoing therapy-  3) smoker.    My Plan is to proceed with:  1) repeat a SPLIT night titration and review oxygen need as well. Patient has neuropathy, PLMs, and COPD. He is considered CPAP dependent , so if he can't fall asleep, give him CPAP and make this a retitration study.     I would like to thank [name], MD and [name], [place],  [place] [id number] for allowing me to meet with and to take care of this pleasant patient.   In short, Stephen Holland is  presenting with COPD, OSA and neuropathy related PLMs and RLS,  I plan to follow up either personally or through our NP within 3 month.   CC: I will share my notes with PCP.   Electronically signed by: Larey Seat, MD 01/08/2021 10:12 AM  [facility] Board certified by The [employer] of Sleep Medicine and Diplomate of the [employer] of Sleep Medicine. Board certified In Neurology through the [place], Fellow of the [employer] of Neurology. Medical Director of [facility].

## 2021-01-12 DIAGNOSIS — H2511 Age-related nuclear cataract, right eye: Secondary | ICD-10-CM | POA: Diagnosis not present

## 2021-01-13 DIAGNOSIS — H25012 Cortical age-related cataract, left eye: Secondary | ICD-10-CM | POA: Diagnosis not present

## 2021-01-13 DIAGNOSIS — H2512 Age-related nuclear cataract, left eye: Secondary | ICD-10-CM | POA: Diagnosis not present

## 2021-01-13 DIAGNOSIS — H25042 Posterior subcapsular polar age-related cataract, left eye: Secondary | ICD-10-CM | POA: Diagnosis not present

## 2021-01-15 ENCOUNTER — Other Ambulatory Visit: Payer: Self-pay | Admitting: Internal Medicine

## 2021-01-28 ENCOUNTER — Ambulatory Visit (INDEPENDENT_AMBULATORY_CARE_PROVIDER_SITE_OTHER): Payer: Medicare Other | Admitting: Neurology

## 2021-01-28 ENCOUNTER — Other Ambulatory Visit: Payer: Self-pay

## 2021-01-28 DIAGNOSIS — G4733 Obstructive sleep apnea (adult) (pediatric): Secondary | ICD-10-CM | POA: Diagnosis not present

## 2021-01-28 DIAGNOSIS — G4761 Periodic limb movement disorder: Secondary | ICD-10-CM

## 2021-01-28 DIAGNOSIS — Z9989 Dependence on other enabling machines and devices: Secondary | ICD-10-CM

## 2021-01-28 DIAGNOSIS — G629 Polyneuropathy, unspecified: Secondary | ICD-10-CM

## 2021-01-28 DIAGNOSIS — I700 Atherosclerosis of aorta: Secondary | ICD-10-CM

## 2021-01-28 DIAGNOSIS — J439 Emphysema, unspecified: Secondary | ICD-10-CM

## 2021-01-28 DIAGNOSIS — J449 Chronic obstructive pulmonary disease, unspecified: Secondary | ICD-10-CM

## 2021-01-28 DIAGNOSIS — F17200 Nicotine dependence, unspecified, uncomplicated: Secondary | ICD-10-CM

## 2021-02-02 DIAGNOSIS — H2512 Age-related nuclear cataract, left eye: Secondary | ICD-10-CM | POA: Diagnosis not present

## 2021-02-06 ENCOUNTER — Other Ambulatory Visit: Payer: Self-pay | Admitting: Internal Medicine

## 2021-02-06 MED ORDER — BISOPROLOL FUMARATE 10 MG PO TABS: ORAL_TABLET | ORAL | 1 refills | Status: DC

## 2021-02-06 MED ORDER — DEXAMETHASONE 4 MG PO TABS: ORAL_TABLET | ORAL | 0 refills | Status: DC

## 2021-02-06 NOTE — Progress Notes (Signed)
   Patient sent MyChart message for 1 month generalized pruritic rash   Suspect "sulfa" type rash   - Advised stop Allopurinol  &  replace  Ziac with Zebeta   - treat with decadron pulse / taper

## 2021-02-10 DIAGNOSIS — G4734 Idiopathic sleep related nonobstructive alveolar hypoventilation: Secondary | ICD-10-CM | POA: Insufficient documentation

## 2021-02-10 DIAGNOSIS — G4733 Obstructive sleep apnea (adult) (pediatric): Secondary | ICD-10-CM | POA: Insufficient documentation

## 2021-02-10 MED ORDER — ALPRAZOLAM 0.5 MG PO TABS: 0.5000 mg | ORAL_TABLET | Freq: Every evening | ORAL | 0 refills | Status: DC | PRN

## 2021-02-10 NOTE — Addendum Note (Signed)
Addended by: Larey Seat on: 02/10/2021 05:38 PM   Modules accepted: Orders

## 2021-02-10 NOTE — Procedures (Signed)
PATIENT'S NAME:  Stephen, Holland DOB:      [birth year]      MR#:    [id number]     DATE OF RECORDING: 01/28/2021- Gaylyn Cheers REFERRING M.D.:  [name], MD Study Performed:   Baseline Polysomnogram HISTORY:  Stephen Holland carries a diagnosis of OSA and COPD overlap disorder. Stephen Holland is an active patient of Dr. Leta Baptist He has a past medical history of Colon polyps, DJD (degenerative joint disease), Gouty arthritis, nasal obstruction, congestion, Hyperlipemia (cholesterol and triglyceride), Hypertension, active tobacco user, chronic dysphonia, Obstructive sleep apnea, Prostate cancer ([place]) (01/2002), Neuropathy on Gabapentin-, and Vitamin D deficiency, and exam for DOT.  Chief concern according to patient:  " The machine is old, can't adjust nothing, all water is gone in AM. " I need a new machine?"-   The patient endorsed the Epworth Sleepiness Scale at 2 points while on CPAP.   The patient's weight 210 pounds with a height of 67 (inches), resulting in a BMI of 32.9 kg/m2. The patient's neck circumference measured 21 inches.  CURRENT MEDICATIONS: Zyloprim, Ziac, Vitamin D3, Klonopin, Neurontin, krill oil, Magnesium, Mobic, Crestor, Micardis PROCEDURE:  This is a multichannel digital polysomnogram utilizing the Somnostar 11.2 system.  Electrodes and sensors were applied and monitored per AASM Specifications.   EEG, EOG, Chin and Limb EMG, were sampled at 200 Hz.  ECG, Snore and Nasal Pressure, Thermal Airflow, Respiratory Effort, CPAP Flow and Pressure, Oximetry was sampled at 50 Hz. Digital video and audio were recorded.      BASELINE STUDY: Lights Out was at 21:25 and Lights On at 04:41.  Total recording time (TRT) was 436 minutes, with a total sleep time (TST) of 253.5 minutes.  The patient's sleep latency was 93 minutes.  REM latency was 0 minutes. The sleep efficiency was 58.1 %.     SLEEP ARCHITECTURE: WASO (Wake after sleep onset) was 111 minutes.  There were 49.5 minutes in  Stage N1, 188.5 minutes Stage N2, 15.5 minutes Stage N3 and 0 minutes in Stage REM.  The percentage of Stage N1 was 19.5%, Stage N2 was 74.4%, Stage N3 was 6.1% and Stage R (REM sleep) was 0%.   RESPIRATORY ANALYSIS:  There were a total of 26 respiratory events:  6 obstructive apneas, 0 central apneas and 0 mixed apneas with a total of 6 apneas and an apnea index (AI) of 1.4 /hour. There were 20 hypopneas with a hypopnea index of 4.7 /hour. The patient also had few respiratory event related arousals (RERAs).     The total APNEA/HYPOPNEA INDEX (AHI) was 6.2/hour.  0 events occurred in REM sleep and 40 events in NREM. The REM AHI was  0.0 /hour, versus a non-REM AHI of 6.2. The patient spent 0 minutes of total sleep time in the supine position and 254 minutes in non-supine( left and prone). The supine AHI was 0.0 versus a non-supine AHI of 6.1.  OXYGEN SATURATION & C02:  The Wake baseline 02 saturation was 90%, with the lowest being 70%. Time spent below 89% saturation equaled 70 minutes.  The patient had a total of 135 Periodic Limb Movements.  The Periodic Limb Movement (PLM) Arousal index was 1.4/hour.   Audio and video analysis did not show any abnormal or unusual movements, behaviors, phonations or vocalizations.   Snoring was noted. EKG was in keeping with normal sinus rhythm (NSR) and isolated PVCs.   IMPRESSION: very poor sleep efficiency and prolonged sleep latency.   1. Very  mild Obstructive Sleep Apnea (OSA) at AHI of 6.2/h. no REM sleep was captured, which ,may have exacerbated the apnea and hypoxia findings.   2. There was clinically significant sleep related hypoxia at a total desaturation time of 70 minutes, and nadir of 70% saturation. 3. Mild Periodic Limb Movement Disorder (PLMD) was noted.  4. Primary Snoring.  RECOMMENDATIONS:  1. Advise full night, attended, PAP titration study to optimize therapy and to see if we need additional oxygen to treat the hypoxemia.  2. The  patient needs to quit smoking urgently, the hypoxemia is most likely a COPD related finding.  3. Neuropathy may contribute to PLMs.      I certify that I have reviewed the entire raw data recording prior to the issuance of this report in accordance with the Standards of Accreditation of the American Academy of Sleep Medicine (AASM)   Larey Seat, MD Diplomat, American Board of Psychiatry and Neurology  Diplomat, American Board of Sleep Medicine Market researcher, [facility] [employer]

## 2021-02-10 NOTE — Progress Notes (Signed)
IMPRESSION: Very poor sleep efficiency and prolonged sleep latency.   1. Very mild Obstructive Sleep Apnea (OSA) at AHI of 6.2/h. No REM sleep was captured, which may have exacerbated the apnea and hypoxia findings.   2. There was clinically significant sleep related hypoxia at a total desaturation time of 70 minutes, and nadir of 70% saturation. 3. Mild Periodic Limb Movement Disorder (PLMD) was noted.  4. Primary Snoring.  RECOMMENDATIONS:  1. Advise full night, attended, PAP titration study to optimize therapy and to see if we need additional oxygen to treat the hypoxemia.  2. The patient needs to quit smoking urgently, the hypoxemia is most likely a COPD related finding.  3. Neuropathy may contribute to PLMs.

## 2021-02-11 ENCOUNTER — Telehealth: Payer: Self-pay | Admitting: Neurology

## 2021-02-11 NOTE — Progress Notes (Signed)
The patient is considered CPAP dependent and instead of returning for titration, I will have to get him an auto CPAP and add ONO at a later point. He reports truly not being able to sleep well without PAP.

## 2021-02-11 NOTE — Telephone Encounter (Signed)
I called pt. I advised pt that Dr. Brett Fairy reviewed their sleep study results and found that pt has sleep apnea. Dr. Brett Fairy recommends that pt starts auto CPAP. I reviewed PAP compliance expectations with the pt. Pt is agreeable to starting a CPAP. I advised pt that an order will be sent to a DME, [facility] and [facility] will call the pt within about one week after they file with the pt's insurance. [facility]) will show the pt how to use the machine, fit for masks, and troubleshoot the CPAP if needed. A follow up appt was made for insurance purposes with Ward Givens, NP on July 7,2022 at 10:30 am. Pt verbalized understanding to arrive 15 minutes early and bring their CPAP. A letter with all of this information in it will be mailed to the pt as a reminder. I verified with the pt that the address we have on file is correct. Pt verbalized understanding of results. Pt had no questions at this time but was encouraged to call back if questions arise. I have sent the order to [facility]) and have received confirmation that they have received the order.

## 2021-02-11 NOTE — Telephone Encounter (Signed)
-----   Message from Larey Seat, MD sent at 02/11/2021  8:48 AM EDT ----- The patient is considered CPAP dependent and instead of returning for titration, I will have to get him an auto CPAP and add ONO at a later point. He reports truly not being able to sleep well without PAP.

## 2021-02-23 ENCOUNTER — Other Ambulatory Visit: Payer: Self-pay | Admitting: Internal Medicine

## 2021-02-23 DIAGNOSIS — G609 Hereditary and idiopathic neuropathy, unspecified: Secondary | ICD-10-CM

## 2021-03-01 ENCOUNTER — Other Ambulatory Visit: Payer: Self-pay

## 2021-03-01 ENCOUNTER — Emergency Department (HOSPITAL_COMMUNITY): Payer: Medicare Other

## 2021-03-01 ENCOUNTER — Inpatient Hospital Stay (HOSPITAL_COMMUNITY)
Admission: EM | Admit: 2021-03-01 | Discharge: 2021-03-06 | DRG: 602 | Disposition: A | Discharge status: Home / Self Care | Payer: Medicare Other | Attending: Internal Medicine | Admitting: Internal Medicine

## 2021-03-01 ENCOUNTER — Encounter (HOSPITAL_COMMUNITY): Payer: Self-pay

## 2021-03-01 DIAGNOSIS — Z823 Family history of stroke: Secondary | ICD-10-CM

## 2021-03-01 DIAGNOSIS — J449 Chronic obstructive pulmonary disease, unspecified: Secondary | ICD-10-CM | POA: Diagnosis present

## 2021-03-01 DIAGNOSIS — Z23 Encounter for immunization: Secondary | ICD-10-CM | POA: Diagnosis not present

## 2021-03-01 DIAGNOSIS — T797XXA Traumatic subcutaneous emphysema, initial encounter: Secondary | ICD-10-CM | POA: Diagnosis not present

## 2021-03-01 DIAGNOSIS — T8182XA Emphysema (subcutaneous) resulting from a procedure, initial encounter: Secondary | ICD-10-CM | POA: Diagnosis not present

## 2021-03-01 DIAGNOSIS — I10 Essential (primary) hypertension: Secondary | ICD-10-CM | POA: Diagnosis not present

## 2021-03-01 DIAGNOSIS — I129 Hypertensive chronic kidney disease with stage 1 through stage 4 chronic kidney disease, or unspecified chronic kidney disease: Secondary | ICD-10-CM | POA: Diagnosis present

## 2021-03-01 DIAGNOSIS — M71122 Other infective bursitis, left elbow: Secondary | ICD-10-CM | POA: Diagnosis present

## 2021-03-01 DIAGNOSIS — A4101 Sepsis due to Methicillin susceptible Staphylococcus aureus: Secondary | ICD-10-CM | POA: Diagnosis not present

## 2021-03-01 DIAGNOSIS — Z20822 Contact with and (suspected) exposure to covid-19: Secondary | ICD-10-CM | POA: Diagnosis not present

## 2021-03-01 DIAGNOSIS — L03114 Cellulitis of left upper limb: Secondary | ICD-10-CM | POA: Diagnosis not present

## 2021-03-01 DIAGNOSIS — B9561 Methicillin susceptible Staphylococcus aureus infection as the cause of diseases classified elsewhere: Secondary | ICD-10-CM | POA: Diagnosis not present

## 2021-03-01 DIAGNOSIS — Z85828 Personal history of other malignant neoplasm of skin: Secondary | ICD-10-CM | POA: Diagnosis not present

## 2021-03-01 DIAGNOSIS — D6959 Other secondary thrombocytopenia: Secondary | ICD-10-CM | POA: Diagnosis not present

## 2021-03-01 DIAGNOSIS — R6520 Severe sepsis without septic shock: Secondary | ICD-10-CM | POA: Diagnosis not present

## 2021-03-01 DIAGNOSIS — N189 Chronic kidney disease, unspecified: Secondary | ICD-10-CM | POA: Diagnosis not present

## 2021-03-01 DIAGNOSIS — Z8546 Personal history of malignant neoplasm of prostate: Secondary | ICD-10-CM | POA: Diagnosis not present

## 2021-03-01 DIAGNOSIS — E782 Mixed hyperlipidemia: Secondary | ICD-10-CM | POA: Diagnosis not present

## 2021-03-01 DIAGNOSIS — G4733 Obstructive sleep apnea (adult) (pediatric): Secondary | ICD-10-CM | POA: Diagnosis not present

## 2021-03-01 DIAGNOSIS — F17210 Nicotine dependence, cigarettes, uncomplicated: Secondary | ICD-10-CM | POA: Diagnosis present

## 2021-03-01 DIAGNOSIS — N179 Acute kidney failure, unspecified: Secondary | ICD-10-CM

## 2021-03-01 DIAGNOSIS — Z79899 Other long term (current) drug therapy: Secondary | ICD-10-CM | POA: Diagnosis not present

## 2021-03-01 DIAGNOSIS — D696 Thrombocytopenia, unspecified: Secondary | ICD-10-CM

## 2021-03-01 DIAGNOSIS — Z8249 Family history of ischemic heart disease and other diseases of the circulatory system: Secondary | ICD-10-CM

## 2021-03-01 DIAGNOSIS — M7032 Other bursitis of elbow, left elbow: Secondary | ICD-10-CM | POA: Diagnosis not present

## 2021-03-01 DIAGNOSIS — Z800 Family history of malignant neoplasm of digestive organs: Secondary | ICD-10-CM

## 2021-03-01 DIAGNOSIS — Z9079 Acquired absence of other genital organ(s): Secondary | ICD-10-CM

## 2021-03-01 DIAGNOSIS — N170 Acute kidney failure with tubular necrosis: Secondary | ICD-10-CM | POA: Diagnosis present

## 2021-03-01 DIAGNOSIS — N183 Chronic kidney disease, stage 3 unspecified: Secondary | ICD-10-CM

## 2021-03-01 DIAGNOSIS — R609 Edema, unspecified: Secondary | ICD-10-CM | POA: Diagnosis not present

## 2021-03-01 DIAGNOSIS — L0390 Cellulitis, unspecified: Secondary | ICD-10-CM | POA: Diagnosis not present

## 2021-03-01 DIAGNOSIS — L03113 Cellulitis of right upper limb: Secondary | ICD-10-CM | POA: Diagnosis not present

## 2021-03-01 DIAGNOSIS — Z8719 Personal history of other diseases of the digestive system: Secondary | ICD-10-CM

## 2021-03-01 DIAGNOSIS — N1831 Chronic kidney disease, stage 3a: Secondary | ICD-10-CM | POA: Diagnosis present

## 2021-03-01 DIAGNOSIS — R0602 Shortness of breath: Secondary | ICD-10-CM | POA: Diagnosis not present

## 2021-03-01 DIAGNOSIS — M109 Gout, unspecified: Secondary | ICD-10-CM | POA: Diagnosis not present

## 2021-03-01 DIAGNOSIS — A419 Sepsis, unspecified organism: Secondary | ICD-10-CM

## 2021-03-01 DIAGNOSIS — M79602 Pain in left arm: Secondary | ICD-10-CM | POA: Diagnosis not present

## 2021-03-01 HISTORY — DX: Acute kidney failure, unspecified: N17.9

## 2021-03-01 LAB — BASIC METABOLIC PANEL
Anion gap: 6 (ref 5–15)
BUN: 31 mg/dL — ABNORMAL HIGH (ref 8–23)
CO2: 25 mmol/L (ref 22–32)
Calcium: 9.2 mg/dL (ref 8.9–10.3)
Chloride: 107 mmol/L (ref 98–111)
Creatinine, Ser: 2.31 mg/dL — ABNORMAL HIGH (ref 0.61–1.24)
GFR, Estimated: 30 mL/min — ABNORMAL LOW (ref >60)
Glucose, Bld: 115 mg/dL — ABNORMAL HIGH (ref 70–99)
Potassium: 4.6 mmol/L (ref 3.5–5.1)
Sodium: 138 mmol/L (ref 135–145)

## 2021-03-01 LAB — CBC WITH DIFFERENTIAL/PLATELET
Abs Immature Granulocytes: 0.09 10*3/uL — ABNORMAL HIGH (ref 0.00–0.07)
Basophils Absolute: 0.1 10*3/uL (ref 0.0–0.1)
Basophils Relative: 0 %
Eosinophils Absolute: 0.1 10*3/uL (ref 0.0–0.5)
Eosinophils Relative: 1 %
HCT: 51.4 % (ref 39.0–52.0)
Hemoglobin: 16.9 g/dL (ref 13.0–17.0)
Immature Granulocytes: 1 %
Lymphocytes Relative: 9 %
Lymphs Abs: 1.1 10*3/uL (ref 0.7–4.0)
MCH: 32.9 pg (ref 26.0–34.0)
MCHC: 32.9 g/dL (ref 30.0–36.0)
MCV: 100.2 fL — ABNORMAL HIGH (ref 80.0–100.0)
Monocytes Absolute: 1.1 10*3/uL — ABNORMAL HIGH (ref 0.1–1.0)
Monocytes Relative: 9 %
Neutro Abs: 10 10*3/uL — ABNORMAL HIGH (ref 1.7–7.7)
Neutrophils Relative %: 80 %
Platelets: 77 10*3/uL — ABNORMAL LOW (ref 150–400)
RBC: 5.13 MIL/uL (ref 4.22–5.81)
RDW: 14.6 % (ref 11.5–15.5)
WBC: 12.4 10*3/uL — ABNORMAL HIGH (ref 4.0–10.5)
nRBC: 0 % (ref 0.0–0.2)

## 2021-03-01 LAB — RESP PANEL BY RT-PCR (FLU A&B, COVID) ARPGX2
Influenza A by PCR: NEGATIVE
Influenza B by PCR: NEGATIVE
SARS Coronavirus 2 by RT PCR: NEGATIVE

## 2021-03-01 LAB — LACTIC ACID, PLASMA: Lactic Acid, Venous: 1.3 mmol/L (ref 0.5–1.9)

## 2021-03-01 MED ORDER — ONDANSETRON HCL 4 MG PO TABS: 4.0000 mg | ORAL_TABLET | Freq: Four times a day (QID) | ORAL | Status: DC | PRN

## 2021-03-01 MED ORDER — ALBUTEROL SULFATE HFA 108 (90 BASE) MCG/ACT IN AERS: 1.0000 | INHALATION_SPRAY | RESPIRATORY_TRACT | Status: DC | PRN

## 2021-03-01 MED ORDER — TETANUS-DIPHTH-ACELL PERTUSSIS 5-2.5-18.5 LF-MCG/0.5 IM SUSY
0.5000 mL | PREFILLED_SYRINGE | Freq: Once | INTRAMUSCULAR | Status: AC
  Administered 2021-03-01: 0.5 mL via INTRAMUSCULAR
  Filled 2021-03-01: qty 0.5

## 2021-03-01 MED ORDER — CLINDAMYCIN PHOSPHATE 600 MG/50ML IV SOLN
600.0000 mg | Freq: Once | INTRAVENOUS | Status: AC
  Administered 2021-03-01: 600 mg via INTRAVENOUS
  Filled 2021-03-01: qty 50

## 2021-03-01 MED ORDER — GABAPENTIN 300 MG PO CAPS
600.0000 mg | ORAL_CAPSULE | Freq: Two times a day (BID) | ORAL | Status: DC
  Administered 2021-03-01 – 2021-03-06 (×10): 600 mg via ORAL
  Filled 2021-03-01 (×10): qty 2

## 2021-03-01 MED ORDER — CEFAZOLIN SODIUM-DEXTROSE 1-4 GM/50ML-% IV SOLN
1.0000 g | Freq: Three times a day (TID) | INTRAVENOUS | Status: DC
  Administered 2021-03-01 – 2021-03-03 (×5): 1 g via INTRAVENOUS
  Filled 2021-03-01 (×5): qty 50

## 2021-03-01 MED ORDER — HEPARIN SODIUM (PORCINE) 5000 UNIT/ML IJ SOLN: 5000.0000 [IU] | Freq: Three times a day (TID) | INTRAMUSCULAR | Status: DC

## 2021-03-01 MED ORDER — ACETAMINOPHEN 325 MG PO TABS
650.0000 mg | ORAL_TABLET | Freq: Four times a day (QID) | ORAL | Status: DC | PRN
  Administered 2021-03-03 – 2021-03-04 (×2): 650 mg via ORAL
  Filled 2021-03-01 (×2): qty 2

## 2021-03-01 MED ORDER — ONDANSETRON HCL 4 MG/2ML IJ SOLN: 4.0000 mg | Freq: Four times a day (QID) | INTRAMUSCULAR | Status: DC | PRN

## 2021-03-01 MED ORDER — SODIUM CHLORIDE 0.9 % IV SOLN: INTRAVENOUS | Status: AC

## 2021-03-01 MED ORDER — BISOPROLOL FUMARATE 5 MG PO TABS
10.0000 mg | ORAL_TABLET | Freq: Every morning | ORAL | Status: DC
  Administered 2021-03-02 – 2021-03-06 (×5): 10 mg via ORAL
  Filled 2021-03-01 (×3): qty 2

## 2021-03-01 MED ORDER — ROSUVASTATIN CALCIUM 20 MG PO TABS
40.0000 mg | ORAL_TABLET | Freq: Every morning | ORAL | Status: DC
  Administered 2021-03-02 – 2021-03-03 (×2): 40 mg via ORAL
  Filled 2021-03-01 (×2): qty 2

## 2021-03-01 MED ORDER — ACETAMINOPHEN 650 MG RE SUPP: 650.0000 mg | Freq: Four times a day (QID) | RECTAL | Status: DC | PRN

## 2021-03-01 NOTE — ED Provider Notes (Signed)
Patient presents with c/o left elbow pain and swelling x 2 days, no fever.  He had a little bump on the elbow 4 days ago.  Exam:  Gen NAD; Heart RRR, nml S1,S2, no m/r/g; Lungs CTAB; Abd soft, NT, no rebound or guarding; Ext left elbow effusion noted with cellulitis of the forearm.  BP 102/63 (BP Location: Right Arm)   Pulse 69   Temp 99.1 F (37.3 C) (Oral)   Resp 18   SpO2 94%   MSE was initiated and I personally evaluated the patient and placed orders (if any) at 3:14 PM  on March 01, 2021.  The patient appears stable so that the remainder of the MSE may be completed by another provider.    Carlisle Cater, PA-C 03/01/21 1520    Lucrezia Starch, MD 03/02/21 [phone]

## 2021-03-01 NOTE — H&P (Signed)
History and Physical    Stephen Holland BDZ:[id number] DOB: 1951/09/04 DOA: 03/01/2021  PCP: [name], MD  Patient coming from: Home  I have personally briefly reviewed patient's old medical records in [place]  Chief Complaint: Left arm redness and swelling  HPI: Stephen Holland is a 70 y.o. male with medical history significant for hypertension, hyperlipidemia, CKD stage IIIa, prostate cancer s/p prostatectomy, tobacco use, and OSA on CPAP who presents to the ED for evaluation of left arm redness and swelling.  Patient states he first noticed redness and some swelling of his left elbow about 3 days ago.  He noticed a small scab at the left elbow but does not recall any injury or bug bite.  Over the last few days he has noticed worsening swelling and redness moving distally toward his hand.  He has had pain in the area which is worse with movement but still has near full range of motion at the elbow.  He denies any subjective fevers, chills, diaphoresis, chest pain, nausea, vomiting, abdominal pain, dysuria.  ED Course:  Initial vitals showed BP 102/63, pulse 69, RR 18, temp 99.1 F, SPO2 94% on room air.  Labs show WBC 12.4, hemoglobin 16.9, platelets 77,000, sodium 138, potassium 4.6, bicarb 25, BUN 31, creatinine 2.31 (baseline creatinine 1.2-1.3), serum glucose 115, lactic acid 1.3.  SARS-CoV-2 PCR negative.  Influenza A/B PCR negative.  Blood cultures obtained and pending.  Left elbow x-ray negative for acute osseous abnormality or joint effusion.  2 view chest x-ray shows hyperinflated lung fields without focal consolidation, edema, or effusion.  Patient was given IV clindamycin and the hospitalist service was consulted to admit for further evaluation and management.  Review of Systems: All systems reviewed and are negative except as documented in history of present illness above.   Past Medical History:  Diagnosis Date  . Colon polyps   . DJD (degenerative joint  disease)   . ED (erectile dysfunction)   . Gout   . Hyperlipemia   . Hypertension   . Obstructive sleep apnea    08/2012, cpap setting of 14  . Prostate cancer ([place]) 01/2002   s/p prostatectomy  . Vitamin D deficiency     Past Surgical History:  Procedure Laterality Date  . COLONOSCOPY WITH PROPOFOL N/A 09/06/2016   Procedure: COLONOSCOPY WITH PROPOFOL;  Surgeon: Garlan Fair, MD;  Location: [facility]COPY;  Service: Endoscopy;  Laterality: N/A;  . colonscopy     x 3 with polyp one time  . PROSTATECTOMY  2003  . SKIN CANCER EXCISION      Social History:  reports that he has been smoking cigarettes. He has a 45.00 pack-year smoking history. He has never used smokeless tobacco. He reports current alcohol use of about 15.0 standard drinks of alcohol per week. He reports that he does not use drugs.  No Known Allergies  Family History  Problem Relation Age of Onset  . Hypertension Mother   . Pulmonary fibrosis Mother   . Stroke Father   . Heart attack Maternal Grandmother   . Stroke Maternal Grandfather   . Stroke Paternal Grandmother   . Colon cancer Paternal Grandfather      Prior to Admission medications   Medication Sig Start Date End Date Taking? Authorizing Provider  allopurinol (ZYLOPRIM) 300 MG tablet TAKE 1 TABLET BY MOUTH DAILY TO PREVENT GOUT 07/01/20   Liane Comber, NP  bisoprolol (ZEBETA) 10 MG tablet Take  1 tablet  Daily  for BP 02/06/21   [name], MD  Cholecalciferol (VITAMIN D3) 125 MCG (5000 UT) CAPS Take 10,000 Units by mouth daily.    [provider]  clonazePAM (KLONOPIN) 1 MG tablet Take  1/2 - 1 tablet  at Bedtime  ONLY  if needed forSleep &  limit to 5 days /week to avoid Addiction & Dementia 02/23/21   [name], MD  dexamethasone (DECADRON) 4 MG tablet Take 1 tab 3 x day for 5 days, then 2 x day for 5 days, then 1 tab daily for 10 days 02/06/21   [name], MD  gabapentin (NEURONTIN) 600 MG tablet TAKE 1/2 TO 1 TABLET 2  TO 3 TIMES DAILY AS NEEDED FOR NEUROPATHY PAIN 02/23/21   [name], MD  KRILL OIL PO Take 700 mg by mouth daily.     [provider]  Magnesium 400 MG CAPS Take 800 mg by mouth daily.     [provider]  meloxicam (MOBIC) 15 MG tablet TAKE 1 TABLET BY MOUTH DAILY AS NEEDED WITH FOOD FOR PAIN & INFLAMMATION 01/15/21   Liane Comber, NP  rosuvastatin (CRESTOR) 40 MG tablet TAKE 1 TABLET BY MOUTH EVERY DAY FOR CHOLESTEROL 12/10/20   Liane Comber, NP  telmisartan (MICARDIS) 80 MG tablet TAKE 1 TABLET DAILY FOR BLOOD PRESSURE 01/15/21   Liane Comber, NP    Physical Exam: Vitals:   03/01/21 1502 03/01/21 1750  BP: 102/63 112/69  Pulse: 69 70  Resp: 18 20  Temp: 99.1 F (37.3 C)   TempSrc: Oral   SpO2: 94% 94%   Constitutional: Resting supine in bed, NAD, calm, comfortable Eyes: PERRL, lids and conjunctivae normal ENMT: Mucous membranes are moist. Posterior pharynx clear of any exudate or lesions.Normal dentition.  Neck: normal, supple, no masses. Respiratory: clear to auscultation bilaterally, no wheezing, no crackles. Normal respiratory effort. No accessory muscle use.  Cardiovascular: Regular rate and rhythm, no murmurs / rubs / gallops. No extremity edema. 2+ pedal pulses. Abdomen: no tenderness, no masses palpated. No hepatosplenomegaly. Bowel sounds positive.  Musculoskeletal: no clubbing / cyanosis. No joint deformity upper and lower extremities. Good ROM, no contractures. Normal muscle tone.  Skin: Erythema at the left elbow moving distally about two thirds down the forearm, scabbed at the left elbow as pictured below.  Slight swelling at the left elbow. Neurologic: CN 2-12 grossly intact. Sensation intact. Strength 5/5 in all 4.  Psychiatric: Normal judgment and insight. Alert and oriented x 3. Normal mood.           Labs on Admission: I have personally reviewed following labs and imaging studies  CBC: Recent Labs  Lab 03/01/21 1534  WBC  12.4*  NEUTROABS 10.0*  HGB 16.9  HCT 51.4  MCV 100.2*  PLT 77*   Basic Metabolic Panel: Recent Labs  Lab 03/01/21 1534  NA 138  K 4.6  CL 107  CO2 25  GLUCOSE 115*  BUN 31*  CREATININE 2.31*  CALCIUM 9.2   GFR: CrCl cannot be calculated (Unknown ideal weight.). Liver Function Tests: No results for input(s): AST, ALT, ALKPHOS, BILITOT, PROT, ALBUMIN in the last 168 hours. No results for input(s): LIPASE, AMYLASE in the last 168 hours. No results for input(s): AMMONIA in the last 168 hours. Coagulation Profile: No results for input(s): INR, PROTIME in the last 168 hours. Cardiac Enzymes: No results for input(s): CKTOTAL, CKMB, CKMBINDEX, TROPONINI in the last 168 hours. BNP (last 3 results) No results for input(s): PROBNP in the last  8760 hours. HbA1C: No results for input(s): HGBA1C in the last 72 hours. CBG: No results for input(s): GLUCAP in the last 168 hours. Lipid Profile: No results for input(s): CHOL, HDL, LDLCALC, TRIG, CHOLHDL, LDLDIRECT in the last 72 hours. Thyroid Function Tests: No results for input(s): TSH, T4TOTAL, FREET4, T3FREE, THYROIDAB in the last 72 hours. Anemia Panel: No results for input(s): VITAMINB12, FOLATE, FERRITIN, TIBC, IRON, RETICCTPCT in the last 72 hours. Urine analysis:    Component Value Date/Time   COLORURINE YELLOW 12/03/2020 1436   APPEARANCEUR CLEAR 12/03/2020 1436   LABSPEC 1.014 12/03/2020 1436   PHURINE < OR = 5.0 12/03/2020 1436   GLUCOSEU NEGATIVE 12/03/2020 1436   HGBUR NEGATIVE 12/03/2020 1436   BILIRUBINUR NEGATIVE 08/12/2016 1429   KETONESUR NEGATIVE 12/03/2020 1436   PROTEINUR 1+ (A) 12/03/2020 1436   NITRITE NEGATIVE 12/03/2020 1436   LEUKOCYTESUR NEGATIVE 12/03/2020 1436    Radiological Exams on Admission: DG Chest 2 View  Result Date: 03/01/2021 CLINICAL DATA:  Shortness of breath. EXAM: CHEST - 2 VIEW COMPARISON:  May 27, 2014 FINDINGS: Hyperinflation of the lungs with flattening of the diaphragms. The  heart, hila, mediastinum, lungs, and pleura are otherwise unremarkable. IMPRESSION: Hyperinflation of the lungs with flattening of the diaphragms consistent with COPD or emphysema. No other abnormalities. Electronically Signed   By: Dorise Bullion III M.D   On: 03/01/2021 17:46   DG Elbow Complete Left  Result Date: 03/01/2021 CLINICAL DATA:  Cellulitis. EXAM: LEFT ELBOW - COMPLETE 3+ VIEW COMPARISON:  None. FINDINGS: No evidence of fracture of the ulna or humerus. The radial head is normal. No joint effusion. No soft tissue abnormality evident IMPRESSION: No acute osseous abnormality. Electronically Signed   By: Suzy Bouchard M.D.   On: 03/01/2021 16:29    EKG: Personally reviewed. Normal sinus rhythm without acute ischemic changes.  Bradycardia has resolved when compared to prior.  Assessment/Plan Principal Problem:   Cellulitis of left upper extremity Active Problems:   Essential hypertension   Hyperlipidemia, mixed   OSA and COPD overlap syndrome (HCC)   Acute kidney injury superimposed on CKD ([place])   Thrombocytopenia (HCC)   Stephen Holland is a 70 y.o. male with medical history significant for hypertension, hyperlipidemia, CKD stage IIIa, prostate cancer s/p prostatectomy, tobacco use, and OSA on CPAP who is admitted with cellulitis of the left upper extremity.  Cellulitis of left upper extremity: Without evidence of abscess at time of admission.  Question injury versus bite as etiology. -Continue IV Ancef -Follow blood cultures -Transition to oral antibiotics as able  AKI on CKD stage IIIa: Creatinine up to 2.31 on admission compared to baseline 1.2-1.3.  Suspect related to dehydration.  He denies NSAID use although meloxicam and naproxen are listed on his home meds. -Start IV fluid hydration overnight -Hold home telmisartan  Thrombocytopenia: Likely related to infectious process.  No obvious bleeding.  Continue to monitor.  Hypertension: Holding telmisartan, continue  bisoprolol.  Hyperlipidemia: Continue rosuvastatin.  COPD: Chronic and stable.  Albuterol as needed.  OSA: Continue CPAP nightly.  Tobacco use: Reports smoking 1 pack/day.  Declines nicotine patch.  Smoking cessation advised.  DVT prophylaxis: SCDs Code Status: Full code, confirmed with patient Family Communication: Discussed with patient, he has discussed with family Disposition Plan: From home and likely discharge to home pending improvement in cellulitis and renal function Consults called: None Level of care: Med-Surg Admission status:  Status is: Observation  The patient remains OBS appropriate and will d/c before 2  midnights.  Dispo: The patient is from: Home              Anticipated d/c is to: Home              Patient currently is not medically stable to d/c.   Difficult to place patient No  Zada Finders MD [facility]  If 7PM-7AM, please contact night-coverage [email or web address]  03/01/2021, 6:49 PM

## 2021-03-01 NOTE — ED Provider Notes (Signed)
[facility] DEPT Provider Note   CSN: [id number] Arrival date & time: 03/01/21  1446     History Chief Complaint  Patient presents with  . Arm Swelling    Stephen Holland is a 70 y.o. male.  Patient is a 70 year old male who presents with left arm redness and swelling.  He has a history of hypertension and hyperlipidemia.  He states 4 days ago he noticed a little soreness in his left elbow.  He had a little scab that was there but was not sure if he bumped it or got bitten or stung in the area.  Over the last few days it is gotten progressively more red and swollen around his elbow and down into his arm.  He denies any fevers.  He is felt really fatigued over the last 2 days and achy.  He has been more sleepier than normal.  He feels a little more short of breath than normal but no cough or cold symptoms.  No leg swelling.  No associated chest pain.  No nausea or vomiting.        Past Medical History:  Diagnosis Date  . Colon polyps   . DJD (degenerative joint disease)   . ED (erectile dysfunction)   . Gout   . Hyperlipemia   . Hypertension   . Obstructive sleep apnea    08/2012, cpap setting of 14  . Prostate cancer ([place]) 01/2002   s/p prostatectomy  . Vitamin D deficiency     Patient Active Problem List   Diagnosis Date Noted  . Chronic intermittent hypoxia with obstructive sleep apnea 02/10/2021  . PLMD (periodic limb movement disorder) 01/08/2021  . CPAP (continuous positive airway pressure) dependence 01/08/2021  . Neuropathy 01/08/2021  . Dyspnea on exertion 03/19/2020  . Coronary artery calcification seen on CT scan 03/19/2020  . Persistent proteinuria 02/22/2020  . Aortic atherosclerosis ([place]) by Chest CT on 12.18/2020 06/30/2017  . COPD (chronic obstructive pulmonary disease) ([place]) 06/13/2017  . Encounter for Department of Transportation (DOT) examination for driving license renewal 64/40/3474  . Morbid obesity ([place]) 08/26/2014   . Tobacco use disorder 08/26/2014  . Essential hypertension   . Hyperlipidemia, mixed   . Abnormal glucose   . OSA and COPD overlap syndrome ([place])   . Vitamin D deficiency   . DJD (degenerative joint disease)   . Primary gout   . ED (erectile dysfunction)     Past Surgical History:  Procedure Laterality Date  . COLONOSCOPY WITH PROPOFOL N/A 09/06/2016   Procedure: COLONOSCOPY WITH PROPOFOL;  Surgeon: Garlan Fair, MD;  Location: [facility];  Service: Endoscopy;  Laterality: N/A;  . colonscopy     x 3 with polyp one time  . PROSTATECTOMY  2003  . SKIN CANCER EXCISION         Family History  Problem Relation Age of Onset  . Hypertension Mother   . Pulmonary fibrosis Mother   . Stroke Father   . Heart attack Maternal Grandmother   . Stroke Maternal Grandfather   . Stroke Paternal Grandmother   . Colon cancer Paternal Grandfather     Social History   Tobacco Use  . Smoking status: Current Every Day Smoker    Packs/day: 1.00    Years: 45.00    Pack years: 45.00    Types: Cigarettes  . Smokeless tobacco: Never Used  Vaping Use  . Vaping Use: Never used  Substance Use Topics  . Alcohol use: Yes  Alcohol/week: 15.0 standard drinks    Types: 15 Standard drinks or equivalent per week    Comment: 03/31/20  2-3 mixed drinks per day  . Drug use: No    Home Medications Prior to Admission medications   Medication Sig Start Date End Date Taking? Authorizing Provider  allopurinol (ZYLOPRIM) 300 MG tablet TAKE 1 TABLET BY MOUTH DAILY TO PREVENT GOUT 07/01/20   Liane Comber, NP  bisoprolol (ZEBETA) 10 MG tablet Take  1 tablet  Daily  for BP 02/06/21   [name], MD  Cholecalciferol (VITAMIN D3) 125 MCG (5000 UT) CAPS Take 10,000 Units by mouth daily.    [provider]  clonazePAM (KLONOPIN) 1 MG tablet Take  1/2 - 1 tablet  at Bedtime  ONLY  if needed forSleep &  limit to 5 days /week to avoid Addiction & Dementia 02/23/21   [name], MD   dexamethasone (DECADRON) 4 MG tablet Take 1 tab 3 x day for 5 days, then 2 x day for 5 days, then 1 tab daily for 10 days 02/06/21   [name], MD  gabapentin (NEURONTIN) 600 MG tablet TAKE 1/2 TO 1 TABLET 2 TO 3 TIMES DAILY AS NEEDED FOR NEUROPATHY PAIN 02/23/21   [name], MD  KRILL OIL PO Take 700 mg by mouth daily.     [provider]  Magnesium 400 MG CAPS Take 800 mg by mouth daily.     [provider]  meloxicam (MOBIC) 15 MG tablet TAKE 1 TABLET BY MOUTH DAILY AS NEEDED WITH FOOD FOR PAIN & INFLAMMATION 01/15/21   Liane Comber, NP  rosuvastatin (CRESTOR) 40 MG tablet TAKE 1 TABLET BY MOUTH EVERY DAY FOR CHOLESTEROL 12/10/20   Liane Comber, NP  telmisartan (MICARDIS) 80 MG tablet TAKE 1 TABLET DAILY FOR BLOOD PRESSURE 01/15/21   Liane Comber, NP    Allergies    Patient has no known allergies.  Review of Systems   Review of Systems  Constitutional: Negative for chills, diaphoresis, fatigue and fever.  HENT: Negative for congestion, rhinorrhea and sneezing.   Eyes: Negative.   Respiratory: Positive for shortness of breath. Negative for cough and chest tightness.   Cardiovascular: Negative for chest pain and leg swelling.  Gastrointestinal: Negative for abdominal pain, blood in stool, diarrhea, nausea and vomiting.  Genitourinary: Negative for difficulty urinating, flank pain, frequency and hematuria.  Musculoskeletal: Positive for myalgias. Negative for arthralgias and back pain.  Skin: Positive for color change. Negative for rash.  Neurological: Negative for dizziness, speech difficulty, weakness, numbness and headaches.    Physical Exam Updated Vital Signs BP 112/69   Pulse 70   Temp 99.1 F (37.3 C) (Oral)   Resp 20   SpO2 94%   Physical Exam Constitutional:      Appearance: He is well-developed.  HENT:     Head: Normocephalic and atraumatic.  Eyes:     Pupils: Pupils are equal, round, and reactive to light.  Cardiovascular:      Rate and Rhythm: Normal rate and regular rhythm.     Heart sounds: Normal heart sounds.  Pulmonary:     Effort: Pulmonary effort is normal. No respiratory distress.     Breath sounds: Normal breath sounds. No wheezing or rales.  Chest:     Chest wall: No tenderness.  Abdominal:     General: Bowel sounds are normal.     Palpations: Abdomen is soft.     Tenderness: There is no abdominal tenderness. There is no  guarding or rebound.  Musculoskeletal:        General: Normal range of motion.     Cervical back: Normal range of motion and neck supple.     Comments: Patient has diffuse swelling around the left elbow and the left forearm.  There is extensive erythema and warmth with some cobblestoning of the skin.  No definitive abscess is palpated.  He has some tenderness over the olecranon bursa but I do not feel a discrete fluid collection.  He does not have any pain on range of motion of the elbow joint.  Distal pulses are intact.  No suggestions of compartment syndrome.  Lymphadenopathy:     Cervical: No cervical adenopathy.  Skin:    General: Skin is warm and dry.     Findings: No rash.  Neurological:     Mental Status: He is alert and oriented to person, place, and time.           ED Results / Procedures / Treatments   Labs (all labs ordered are listed, but only abnormal results are displayed) Labs Reviewed  CBC WITH DIFFERENTIAL/PLATELET - Abnormal; Notable for the following components:      Result Value   WBC 12.4 (*)    MCV 100.2 (*)    Platelets 77 (*)    Neutro Abs 10.0 (*)    Monocytes Absolute 1.1 (*)    Abs Immature Granulocytes 0.09 (*)    All other components within normal limits  BASIC METABOLIC PANEL - Abnormal; Notable for the following components:   Glucose, Bld 115 (*)    BUN 31 (*)    Creatinine, Ser 2.31 (*)    GFR, Estimated 30 (*)    All other components within normal limits  CULTURE, BLOOD (ROUTINE X 2)  CULTURE, BLOOD (ROUTINE X 2)  RESP PANEL BY  RT-PCR (FLU A&B, COVID) ARPGX2  LACTIC ACID, PLASMA    EKG EKG Interpretation  Date/Time:  Sunday March 01 2021 17:51:16 EDT Ventricular Rate:  69 PR Interval:  154 QRS Duration: 75 QT Interval:  362 QTC Calculation: 388 R Axis:   15 Text Interpretation: Sinus rhythm ST elevation, consider inferior injury since last tracing no significant change Confirmed by Malvin Johns [phone]) on 03/01/2021 6:00:35 PM   Radiology DG Chest 2 View  Result Date: 03/01/2021 CLINICAL DATA:  Shortness of breath. EXAM: CHEST - 2 VIEW COMPARISON:  May 27, 2014 FINDINGS: Hyperinflation of the lungs with flattening of the diaphragms. The heart, hila, mediastinum, lungs, and pleura are otherwise unremarkable. IMPRESSION: Hyperinflation of the lungs with flattening of the diaphragms consistent with COPD or emphysema. No other abnormalities. Electronically Signed   By: Dorise Bullion III M.D   On: 03/01/2021 17:46   DG Elbow Complete Left  Result Date: 03/01/2021 CLINICAL DATA:  Cellulitis. EXAM: LEFT ELBOW - COMPLETE 3+ VIEW COMPARISON:  None. FINDINGS: No evidence of fracture of the ulna or humerus. The radial head is normal. No joint effusion. No soft tissue abnormality evident IMPRESSION: No acute osseous abnormality. Electronically Signed   By: Suzy Bouchard M.D.   On: 03/01/2021 16:29    Procedures Procedures   Medications Ordered in ED Medications  clindamycin (CLEOCIN) IVPB 600 mg (600 mg Intravenous New Bag/Given 03/01/21 1743)  Tdap (BOOSTRIX) injection 0.5 mL (0.5 mLs Intramuscular Given 03/01/21 1742)    ED Course  I have reviewed the triage vital signs and the nursing notes.  Pertinent labs & imaging results that were available during my care  of the patient were reviewed by me and considered in my medical decision making (see chart for details).    MDM Rules/Calculators/A&P                          Patient presents with diffuse swelling and redness of the left elbow and left arm.  It  appears to be consistent with a cellulitis.  I do not see a large amount of swelling or fluid collection over the olecranon bursa which would be more consistent with olecranon bursitis.  He does not have any effusion noted on x-ray.  This was reviewed by me.  He does not have pain on range of motion of the elbow joint which would go against septic arthritis.  I feel his symptoms are most consistent with a cellulitis.  He does have an extensive amount of cellulitis and an acute kidney injury.  I spoke with Dr. Posey Pronto who will admit the patient to the hospital for further treatment.  He was started on clindamycin.  His tetanus shot was updated. Final Clinical Impression(s) / ED Diagnoses Final diagnoses:  Cellulitis of left upper extremity    Rx / DC Orders ED Discharge Orders    None       Malvin Johns, MD 03/01/21 1845

## 2021-03-01 NOTE — ED Triage Notes (Signed)
Pt reports noticing some soreness on left elbow, which then began swelling on Friday. Pts left arm is visibly swollen and red.

## 2021-03-02 DIAGNOSIS — N189 Chronic kidney disease, unspecified: Secondary | ICD-10-CM | POA: Diagnosis not present

## 2021-03-02 DIAGNOSIS — I10 Essential (primary) hypertension: Secondary | ICD-10-CM | POA: Diagnosis not present

## 2021-03-02 DIAGNOSIS — E782 Mixed hyperlipidemia: Secondary | ICD-10-CM

## 2021-03-02 DIAGNOSIS — N1831 Chronic kidney disease, stage 3a: Secondary | ICD-10-CM | POA: Diagnosis not present

## 2021-03-02 DIAGNOSIS — N179 Acute kidney failure, unspecified: Secondary | ICD-10-CM | POA: Diagnosis not present

## 2021-03-02 DIAGNOSIS — D696 Thrombocytopenia, unspecified: Secondary | ICD-10-CM | POA: Diagnosis not present

## 2021-03-02 DIAGNOSIS — L03114 Cellulitis of left upper limb: Secondary | ICD-10-CM | POA: Diagnosis not present

## 2021-03-02 LAB — CBC
HCT: 47.6 % (ref 39.0–52.0)
Hemoglobin: 15.7 g/dL (ref 13.0–17.0)
MCH: 32.8 pg (ref 26.0–34.0)
MCHC: 33 g/dL (ref 30.0–36.0)
MCV: 99.6 fL (ref 80.0–100.0)
Platelets: 73 10*3/uL — ABNORMAL LOW (ref 150–400)
RBC: 4.78 MIL/uL (ref 4.22–5.81)
RDW: 14.7 % (ref 11.5–15.5)
WBC: 10.5 10*3/uL (ref 4.0–10.5)
nRBC: 0 % (ref 0.0–0.2)

## 2021-03-02 LAB — BASIC METABOLIC PANEL
Anion gap: 9 (ref 5–15)
BUN: 32 mg/dL — ABNORMAL HIGH (ref 8–23)
CO2: 23 mmol/L (ref 22–32)
Calcium: 8.8 mg/dL — ABNORMAL LOW (ref 8.9–10.3)
Chloride: 107 mmol/L (ref 98–111)
Creatinine, Ser: 1.71 mg/dL — ABNORMAL HIGH (ref 0.61–1.24)
GFR, Estimated: 43 mL/min — ABNORMAL LOW (ref >60)
Glucose, Bld: 113 mg/dL — ABNORMAL HIGH (ref 70–99)
Potassium: 4.2 mmol/L (ref 3.5–5.1)
Sodium: 139 mmol/L (ref 135–145)

## 2021-03-02 MED ORDER — SODIUM CHLORIDE 0.9 % IV SOLN: INTRAVENOUS | Status: DC

## 2021-03-02 NOTE — Hospital Course (Addendum)
11yom PMH HTN, hyperlipidemia, CKD, OSA presented w/ left arm redness and swelling. Admitted for cellulitis of LUE and AKI.  Edema and erythema worsened on cefazolin, changed to Cubicin.  Infectious disease consulted.  MRI pending to assess for deep fluid collections.  If abscesses found, will consult orthopedics.  A & P  Cellulitis LUE. Left elbow film unremarkable.  --More edematous today and erythematous.  Appears to have developing abscess over the olecranon. --Perfusion of the arm and hand appears to be intact.  Sensation was grossly intact.  No pallor.  AKI superimposed on CKD stage IIIa --hold telmisartan  --Mildly worse today.  FENa suggests intrinsic etiology.  Thrombocytopenia --Trending up.  Secondary to acute infection.  Monitor periodically.  Essential HTN --Remains stable.  Continue bisoprolol  Hyperlipidemia  --continue rosuvastatin   Gout --continue allopurinol   COPD, smoker --Remains stable. --recommend smoking cessation.  OSA --continue CPAP QHS

## 2021-03-02 NOTE — Progress Notes (Signed)
PROGRESS NOTE  Stephen Holland VPX:[id number] DOB: [birth year] DOA: 03/01/2021 PCP: [name], MD  Brief History    60yom PMH HTN, hyperlipidemia, CKD, OSA presented w/ left arm redness and swelling. Admitted for cellulitis of LUE and AKI.  A & P  Cellulitis LUE. Left elbow film unremarkable.  --Appears to have more swelling today than pictures yesterday and more erythema.  Some tenderness and range of motion limited by swelling.  Wonder if he has olecranon bursitis. --Orthopedics consult today for further recommendations. --Elevate arm.  AKI superimposed on CKD stage IIIa --hold telmisartan  --improved today, continue IVF, check BMP in AM  Thrombocytopenia --stable, presume secondary to acute infection  --CBC in a.m.  Essential HTN --Stable.  Continue bisoprolol  Hyperlipidemia  --continue rosuvastatin   Gout --continue allopurinol   COPD, smoker --Stable. --recommend smoking cessation.  OSA --continue CPAP QHS  Disposition Plan:  Discussion: Continue antibiotics, fluids.  Lab work in a.m.  Will ask orthopedics for opinion.  I do not see any signs or symptoms at this point to suggest compartment syndrome.  Dispo: The patient is from: Home              Anticipated d/c is to: Home              Patient currently is not medically stable to d/c.   Difficult to place patient No  DVT prophylaxis: SCDs Start: 03/01/21 1935   Code Status: Full Code Level of care: Med-Surg Family Communication:   Murray Hodgkins, MD  [facility] Direct contact: see [email or web address] (further directions at bottom of note if needed) 7PM-7AM contact night coverage as at bottom of note 03/02/2021, 11:34 AM  LOS: 0 days   Significant Hospital Events   . 4/3 admitted for cellulitis left arm   Consults:  .    Procedures:  .   Significant Diagnostic Tests:  . Left elbow x-ray unremarkable . Chest x-ray negative   Micro Data:  . 4/3 blood cultures no growth to date    Antimicrobials:  . Cefazolin 4/3 >  .   Interval History/Subjective  CC: f/u left arm cellulitis  Distal arm and hand more swollen today.  Range of movement at the elbow somewhat restricted by swelling.  Has some pain in forearm and elbow but not much.  No paresthesias.  Reports dark urine.  Objective   Vitals:  Vitals:   03/02/21 0557 03/02/21 0920  BP: 93/79 (!) 115/49  Pulse: 74 75  Resp: 18   Temp: (!) 97.5 F (36.4 C) 98.8 F (37.1 C)  SpO2: 96% 94%    Exam:  Constitutional:   . Appears calm and comfortable ENMT:  . slightly hard of hearing Respiratory:  . CTA bilaterally, no w/r/r.  . Respiratory effort normal.  Cardiovascular:  . RRR, no m/r/g . No LE extremity edema   Musculoskeletal:  . LUE o strength and tone grossly normal, no atrophy, no abnormal movements o No masses. Mild tenderness left AC and forearm and over olecranon Skin:  . No rashes, lesions, ulcers . palpation of skin: no fluctuance or nodules . Small abrasion healing over olecranon . Perfusion of the left hand and sensation appears grossly intact. Psychiatric:  . Mental status o Mood, affect appropriate  I have personally reviewed the following:   Today's Data  . Creatinine down to 1.71, remainder BMP unremarkable . Platelets stable at 73  Scheduled Meds: . bisoprolol  10 mg Oral q morning  . gabapentin  600  mg Oral BID  . rosuvastatin  40 mg Oral q morning   Continuous Infusions: . sodium chloride 150 mL/hr at 03/02/21 1014  .  ceFAZolin (ANCEF) IV 1 g (03/02/21 0541)    Principal Problem:   Cellulitis of left upper extremity Active Problems:   Essential hypertension   Hyperlipidemia, mixed   OSA and COPD overlap syndrome ([facility])   Acute kidney injury superimposed on CKD ([place])   Thrombocytopenia ([place])   LOS: 0 days   How to contact the [facility] Attending or Consulting provider 7A - 7P or covering provider during after hours [place], for this patient?  1. Check the care team  in [facility] and look for a) attending/consulting [facility] provider listed and b) the [facility] team listed 2. Log into [email or web address] and use [facility]'s universal password to access. If you do not have the password, please contact the hospital operator. 3. Locate the [facility] provider you are looking for under [facility] and page to a number that you can be directly reached. 4. If you still have difficulty reaching the provider, please page the [facility] (Director on Call) for the Hospitalists listed on amion for assistance.

## 2021-03-03 ENCOUNTER — Inpatient Hospital Stay (HOSPITAL_COMMUNITY): Payer: Medicare Other

## 2021-03-03 DIAGNOSIS — M71122 Other infective bursitis, left elbow: Secondary | ICD-10-CM | POA: Diagnosis present

## 2021-03-03 DIAGNOSIS — D696 Thrombocytopenia, unspecified: Secondary | ICD-10-CM

## 2021-03-03 DIAGNOSIS — N1831 Chronic kidney disease, stage 3a: Secondary | ICD-10-CM

## 2021-03-03 DIAGNOSIS — L0390 Cellulitis, unspecified: Secondary | ICD-10-CM | POA: Diagnosis not present

## 2021-03-03 DIAGNOSIS — G4733 Obstructive sleep apnea (adult) (pediatric): Secondary | ICD-10-CM | POA: Diagnosis not present

## 2021-03-03 DIAGNOSIS — Z9079 Acquired absence of other genital organ(s): Secondary | ICD-10-CM | POA: Diagnosis not present

## 2021-03-03 DIAGNOSIS — M109 Gout, unspecified: Secondary | ICD-10-CM | POA: Diagnosis present

## 2021-03-03 DIAGNOSIS — R6520 Severe sepsis without septic shock: Secondary | ICD-10-CM | POA: Diagnosis not present

## 2021-03-03 DIAGNOSIS — R609 Edema, unspecified: Secondary | ICD-10-CM | POA: Diagnosis not present

## 2021-03-03 DIAGNOSIS — Z823 Family history of stroke: Secondary | ICD-10-CM | POA: Diagnosis not present

## 2021-03-03 DIAGNOSIS — N170 Acute kidney failure with tubular necrosis: Secondary | ICD-10-CM | POA: Diagnosis present

## 2021-03-03 DIAGNOSIS — Z85828 Personal history of other malignant neoplasm of skin: Secondary | ICD-10-CM | POA: Diagnosis not present

## 2021-03-03 DIAGNOSIS — T8182XA Emphysema (subcutaneous) resulting from a procedure, initial encounter: Secondary | ICD-10-CM | POA: Diagnosis not present

## 2021-03-03 DIAGNOSIS — B9561 Methicillin susceptible Staphylococcus aureus infection as the cause of diseases classified elsewhere: Secondary | ICD-10-CM | POA: Diagnosis not present

## 2021-03-03 DIAGNOSIS — N179 Acute kidney failure, unspecified: Secondary | ICD-10-CM | POA: Diagnosis not present

## 2021-03-03 DIAGNOSIS — Z79899 Other long term (current) drug therapy: Secondary | ICD-10-CM | POA: Diagnosis not present

## 2021-03-03 DIAGNOSIS — A4101 Sepsis due to Methicillin susceptible Staphylococcus aureus: Secondary | ICD-10-CM | POA: Diagnosis not present

## 2021-03-03 DIAGNOSIS — M7032 Other bursitis of elbow, left elbow: Secondary | ICD-10-CM | POA: Diagnosis not present

## 2021-03-03 DIAGNOSIS — D6959 Other secondary thrombocytopenia: Secondary | ICD-10-CM | POA: Diagnosis present

## 2021-03-03 DIAGNOSIS — Z8719 Personal history of other diseases of the digestive system: Secondary | ICD-10-CM | POA: Diagnosis not present

## 2021-03-03 DIAGNOSIS — Z8249 Family history of ischemic heart disease and other diseases of the circulatory system: Secondary | ICD-10-CM | POA: Diagnosis not present

## 2021-03-03 DIAGNOSIS — F17210 Nicotine dependence, cigarettes, uncomplicated: Secondary | ICD-10-CM | POA: Diagnosis present

## 2021-03-03 DIAGNOSIS — J449 Chronic obstructive pulmonary disease, unspecified: Secondary | ICD-10-CM | POA: Diagnosis not present

## 2021-03-03 DIAGNOSIS — Z20822 Contact with and (suspected) exposure to covid-19: Secondary | ICD-10-CM | POA: Diagnosis present

## 2021-03-03 DIAGNOSIS — L03114 Cellulitis of left upper limb: Principal | ICD-10-CM

## 2021-03-03 DIAGNOSIS — Z8546 Personal history of malignant neoplasm of prostate: Secondary | ICD-10-CM | POA: Diagnosis not present

## 2021-03-03 DIAGNOSIS — N189 Chronic kidney disease, unspecified: Secondary | ICD-10-CM | POA: Diagnosis not present

## 2021-03-03 DIAGNOSIS — T797XXA Traumatic subcutaneous emphysema, initial encounter: Secondary | ICD-10-CM | POA: Diagnosis not present

## 2021-03-03 DIAGNOSIS — N183 Chronic kidney disease, stage 3 unspecified: Secondary | ICD-10-CM

## 2021-03-03 DIAGNOSIS — Z800 Family history of malignant neoplasm of digestive organs: Secondary | ICD-10-CM | POA: Diagnosis not present

## 2021-03-03 DIAGNOSIS — I10 Essential (primary) hypertension: Secondary | ICD-10-CM | POA: Diagnosis not present

## 2021-03-03 DIAGNOSIS — Z23 Encounter for immunization: Secondary | ICD-10-CM | POA: Diagnosis not present

## 2021-03-03 DIAGNOSIS — A419 Sepsis, unspecified organism: Secondary | ICD-10-CM

## 2021-03-03 DIAGNOSIS — M79602 Pain in left arm: Secondary | ICD-10-CM | POA: Diagnosis not present

## 2021-03-03 DIAGNOSIS — E782 Mixed hyperlipidemia: Secondary | ICD-10-CM | POA: Diagnosis not present

## 2021-03-03 DIAGNOSIS — I129 Hypertensive chronic kidney disease with stage 1 through stage 4 chronic kidney disease, or unspecified chronic kidney disease: Secondary | ICD-10-CM | POA: Diagnosis present

## 2021-03-03 LAB — CBC
HCT: 44.4 % (ref 39.0–52.0)
Hemoglobin: 14.2 g/dL (ref 13.0–17.0)
MCH: 32.3 pg (ref 26.0–34.0)
MCHC: 32 g/dL (ref 30.0–36.0)
MCV: 100.9 fL — ABNORMAL HIGH (ref 80.0–100.0)
Platelets: 81 10*3/uL — ABNORMAL LOW (ref 150–400)
RBC: 4.4 MIL/uL (ref 4.22–5.81)
RDW: 14.9 % (ref 11.5–15.5)
WBC: 9.6 10*3/uL (ref 4.0–10.5)
nRBC: 0 % (ref 0.0–0.2)

## 2021-03-03 LAB — BASIC METABOLIC PANEL
Anion gap: 8 (ref 5–15)
BUN: 24 mg/dL — ABNORMAL HIGH (ref 8–23)
CO2: 23 mmol/L (ref 22–32)
Calcium: 8.6 mg/dL — ABNORMAL LOW (ref 8.9–10.3)
Chloride: 111 mmol/L (ref 98–111)
Creatinine, Ser: 1.91 mg/dL — ABNORMAL HIGH (ref 0.61–1.24)
GFR, Estimated: 37 mL/min — ABNORMAL LOW (ref >60)
Glucose, Bld: 109 mg/dL — ABNORMAL HIGH (ref 70–99)
Potassium: 4 mmol/L (ref 3.5–5.1)
Sodium: 142 mmol/L (ref 135–145)

## 2021-03-03 LAB — URINALYSIS, ROUTINE W REFLEX MICROSCOPIC
Bilirubin Urine: NEGATIVE
Glucose, UA: NEGATIVE mg/dL
Ketones, ur: NEGATIVE mg/dL
Leukocytes,Ua: NEGATIVE
Nitrite: NEGATIVE
Protein, ur: 30 mg/dL — AB
Specific Gravity, Urine: 1.013 (ref 1.005–1.030)
pH: 6 (ref 5.0–8.0)

## 2021-03-03 LAB — CREATININE, URINE, RANDOM: Creatinine, Urine: 74.25 mg/dL

## 2021-03-03 LAB — SODIUM, URINE, RANDOM: Sodium, Ur: 125 mmol/L

## 2021-03-03 MED ORDER — SODIUM CHLORIDE 0.9 % IV SOLN
500.0000 mg | Freq: Every day | INTRAVENOUS | Status: DC
  Administered 2021-03-03 – 2021-03-04 (×2): 500 mg via INTRAVENOUS
  Filled 2021-03-03 (×3): qty 10

## 2021-03-03 MED ORDER — VANCOMYCIN HCL 1000 MG/200ML IV SOLN: 1000.0000 mg | Freq: Once | INTRAVENOUS | Status: DC

## 2021-03-03 NOTE — Progress Notes (Signed)
Patient declines the use of nocturnal CPAP tonight.  Equipment remains at the bedside should he change his mind and become more compliant. He is aware that RT is available if he should require further assistance tonight.

## 2021-03-03 NOTE — Progress Notes (Signed)
Pharmacy Antibiotic Note  Stephen Holland is a 70 y.o. male admitted on 03/01/2021 with cellulitis.  Pharmacy has been consulted for daptomycin dosing.  Pt presented with left arm redness and swelling, AKI. Pt currently on cefazolin, but appears to have more swelling and erythema than yesterday. Some concern for olecranon bursitis, ortho being consulted. Antibiotics being broadened for MRSA coverage. Recommended daptomycin > vancomycin given AKI on admission.   Today, 03/03/21  WBC WNL  SCr 1.91, CrCl ~39 mL/min. Improving, but remains elevated from baseline SCr ~1.2-1.3  TBW = 92.5 kg, BMI ~32. Adjusted BW = 78 kg  Plan:  Daptomycin 500 mg IV q24h  Follow renal function and culture data  Order weekly CK  Height: 5\' 7"  (170.2 cm) Weight: 92.5 kg (203 lb 14.8 oz) IBW/kg (Calculated) : 66.1  Temp (24hrs), Avg:98.8 F (37.1 C), Min:97.7 F (36.5 C), Max:99.9 F (37.7 C)  Recent Labs  Lab 03/01/21 1534 03/01/21 1535 03/02/21 0440 03/03/21 0510  WBC 12.4*  --  10.5 9.6  CREATININE 2.31*  --  1.71* 1.91*  LATICACIDVEN  --  1.3  --   --     Estimated Creatinine Clearance: 39 mL/min (A) (by C-G formula based on SCr of 1.91 mg/dL (H)).    No Known Allergies  Antimicrobials this admission: cefazolin 4/3 >> 4/5 daptomycin 4/5 >>   Dose adjustments this admission:  Microbiology results: 4/3 BCx: ngtd  Thank you for allowing pharmacy to be a part of this patient's care.  Lenis Noon, PharmD 03/03/2021 10:08 AM

## 2021-03-03 NOTE — Consult Note (Addendum)
[facility] for Infectious Disease    Date of Admission:  03/01/2021     Reason for Consult: olecronon bursitis/abscess    Referring Provider: Sarajane Jews   CC: Severe Sepsis without septic shock olecronon bursitis/abscess   Lines:  Peripheral iv's  Abx: 4/05-c daptomycin  4/03-4/05 cefazolin        Assessment: 70 yo male smoker, ckd3, htn/hlp, osa/cpap admitted 4/03 for 2 days acute progressive pain/redness/swelling of the left elbow clinically consistent with septic bursitis/cellulitis  The joint doesn't appear infected on exam. No significant improvement with 2 days abx. pendign further w/u with mri to assess for deeper involvement and ortho evaluation to see if I&D or bursectomy indicated  4/03 admission blood cx negative so far  Agree looks like a staph aureus driven process  Severe sepsis with aki on ckd. some improvement. Related to sepsis atn it appears with the aki  Thrombocytopenia also likely sepsis related. No other obvious sign of dic    Plan: 1. Continue daptomycin 2. Agree with mri imaging r/o septic arthritis/OM     I spent 60 minute reviewing data/chart, and coordinating care and >50% direct face to face time providing counseling/discussing diagnostics/treatment plan with patient   ------------------------------------------------ Principal Problem:   Cellulitis of left upper extremity Active Problems:   Essential hypertension   Hyperlipidemia, mixed   OSA and COPD overlap syndrome (HCC)   Acute kidney injury superimposed on CKD (HCC)   Thrombocytopenia ([place])   Cellulitis of left arm    HPI: Stephen Holland is a 70 y.o. male pmh htn, hlp ckd3, prostate cancer s/p prostatectomy, tobacco use, osa/cpap admitted 4/03 for left arm redness/swelling dxed with olecronon bursitis/abscess   3 day prior to admission onset of sx. Doesn't recall trauma/bug bite, but was working on the deck  No subjective fever/chill, other joint pain,  headache, chest pain, cough  No recent medical procedures  Afebrile on admission with stable hemodynamics. Wbc mildly elevated 12.5. acute kidney injury with cr 2.3 (baseline 1.2-1.3). cxr no pna. Left elbow xray no acute osseous abnormality. Patient started on cefazolin but transitioned to daptomycin as of today  He remains afebrile Ortho being consulted for evaluation  On daptomycin of 2 days no improvement of the left elbow swellign  Still able to bend/extend left elbow fairly well    Family History  Problem Relation Age of Onset  . Hypertension Mother   . Pulmonary fibrosis Mother   . Stroke Father   . Heart attack Maternal Grandmother   . Stroke Maternal Grandfather   . Stroke Paternal Grandmother   . Colon cancer Paternal Grandfather     Social History   Tobacco Use  . Smoking status: Current Every Day Smoker    Packs/day: 1.00    Years: 45.00    Pack years: 45.00    Types: Cigarettes  . Smokeless tobacco: Never Used  Vaping Use  . Vaping Use: Never used  Substance Use Topics  . Alcohol use: Yes    Alcohol/week: 15.0 standard drinks    Types: 15 Standard drinks or equivalent per week    Comment: 03/31/20  2-3 mixed drinks per day  . Drug use: No    No Known Allergies  Review of Systems: ROS All Other ROS was negative, except mentioned above   Past Medical History:  Diagnosis Date  . Colon polyps   . DJD (degenerative joint disease)   . ED (erectile dysfunction)   . Gout   .  Hyperlipemia   . Hypertension   . Obstructive sleep apnea    08/2012, cpap setting of 14  . Prostate cancer ([facility]) 01/2002   s/p prostatectomy  . Vitamin D deficiency        Scheduled Meds: . bisoprolol  10 mg Oral q morning  . gabapentin  600 mg Oral BID   Continuous Infusions: . sodium chloride 150 mL/hr at 03/03/21 0533  . DAPTOmycin (CUBICIN)  IV 500 mg (03/03/21 1216)   PRN Meds:.acetaminophen **OR** acetaminophen, albuterol, ondansetron **OR** ondansetron  (ZOFRAN) IV   OBJECTIVE: Blood pressure (!) 160/82, pulse 71, temperature 97.7 F (36.5 C), temperature source Oral, resp. rate 18, height 5\' 7"  (1.702 m), weight 92.5 kg, SpO2 92 %.  Physical Exam Well developed, conversant, nontoxic appearing Heent: normocephali; per; conj clear; eomi Neck supple cv rrr no mrg Lungs clear abd s/nt Ext no edema Skin erythematous rash with a few areas of small pustule and scabbing left elbow over the olecronon process; slight fluctuance over the bursa msk able to do active/passive rom left elbow without much tenderness Neuro cn2-12 intact; strength/reflex intact Psych alert/oriented    Lab Results Lab Results  Component Value Date   WBC 9.6 03/03/2021   HGB 14.2 03/03/2021   HCT 44.4 03/03/2021   MCV 100.9 (H) 03/03/2021   PLT 81 (L) 03/03/2021    Lab Results  Component Value Date   CREATININE 1.91 (H) 03/03/2021   BUN 24 (H) 03/03/2021   NA 142 03/03/2021   K 4.0 03/03/2021   CL 111 03/03/2021   CO2 23 03/03/2021    Lab Results  Component Value Date   ALT 22 12/03/2020   AST 24 12/03/2020   ALKPHOS 58 06/13/2017   BILITOT 0.5 12/03/2020      Microbiology: Recent Results (from the past 240 hour(s))  Resp Panel by RT-PCR (Flu A&B, Covid) Nasopharyngeal Swab     Status: None   Collection Time: 03/01/21  5:44 PM   Specimen: Nasopharyngeal Swab; Nasopharyngeal(NP) swabs in vial transport medium  Result Value Ref Range Status   SARS Coronavirus 2 by RT PCR NEGATIVE NEGATIVE Final    Comment: (NOTE) SARS-CoV-2 target nucleic acids are NOT DETECTED.  The SARS-CoV-2 RNA is generally detectable in upper respiratory specimens during the acute phase of infection. The lowest concentration of SARS-CoV-2 viral copies this assay can detect is 138 copies/mL. A negative result does not preclude SARS-Cov-2 infection and should not be used as the sole basis for treatment or other patient management decisions. A negative result may occur  with  improper specimen collection/handling, submission of specimen other than nasopharyngeal swab, presence of viral mutation(s) within the areas targeted by this assay, and inadequate number of viral copies(<138 copies/mL). A negative result must be combined with clinical observations, patient history, and epidemiological information. The expected result is Negative.  Fact Sheet for Patients:  EntrepreneurPulse.com.au  Fact Sheet for Healthcare Providers:  IncredibleEmployment.be  This test is no t yet approved or cleared by the [place] FDA and  has been authorized for detection and/or diagnosis of SARS-CoV-2 by FDA under an Emergency Use Authorization (EUA). This EUA will remain  in effect (meaning this test can be used) for the duration of the COVID-19 declaration under Section 564(b)(1) of the Act, 21 U.S.C.section 360bbb-3(b)(1), unless the authorization is terminated  or revoked sooner.       Influenza A by PCR NEGATIVE NEGATIVE Final   Influenza B by PCR NEGATIVE NEGATIVE Final  Comment: (NOTE) The Xpert Xpress SARS-CoV-2/FLU/RSV plus assay is intended as an aid in the diagnosis of influenza from Nasopharyngeal swab specimens and should not be used as a sole basis for treatment. Nasal washings and aspirates are unacceptable for Xpert Xpress SARS-CoV-2/FLU/RSV testing.  Fact Sheet for Patients: EntrepreneurPulse.com.au  Fact Sheet for Healthcare Providers: IncredibleEmployment.be  This test is not yet approved or cleared by the [place] FDA and has been authorized for detection and/or diagnosis of SARS-CoV-2 by FDA under an Emergency Use Authorization (EUA). This EUA will remain in effect (meaning this test can be used) for the duration of the COVID-19 declaration under Section 564(b)(1) of the Act, 21 U.S.C. section 360bbb-3(b)(1), unless the authorization is terminated  or revoked.  Performed at [facility], [place] [address] [place], [place] [address]   Culture, blood (routine x 2)     Status: None (Preliminary result)   Collection Time: 03/01/21  5:59 PM   Specimen: BLOOD  Result Value Ref Range Status   Specimen Description   Final    BLOOD SITE NOT SPECIFIED Performed at [facility] [address] [place], [place] [address]    Special Requests   Final    BOTTLES DRAWN AEROBIC AND ANAEROBIC Blood Culture adequate volume Performed at [facility] [address], [place], [place] [address]    Culture   Final    NO GROWTH 2 DAYS Performed at [facility] [address], [place], [place] [address]    Report Status PENDING  Incomplete  Culture, blood (routine x 2)     Status: None (Preliminary result)   Collection Time: 03/01/21  6:03 PM   Specimen: BLOOD  Result Value Ref Range Status   Specimen Description BLOOD SITE NOT SPECIFIED  Final   Special Requests   Final    BOTTLES DRAWN AEROBIC AND ANAEROBIC Blood Culture adequate volume   Culture   Final    NO GROWTH 2 DAYS Performed at [facility], [address], [place], [place] [address]    Report Status PENDING  Incomplete     Serology:    Imaging: If present, new imagings (plain films, ct scans, and mri) have been personally visualized and interpreted; radiology reports have been reviewed. Decision making incorporated into the Impression / Recommendations.  4/03 elbow xray no osseous abnormality  4/03 cxr clear, but hyperinflation of the lungs  Jabier Mutton, [place] for [facility] [place] [phone] pager    03/03/2021, 3:08 PM

## 2021-03-03 NOTE — Progress Notes (Signed)
PROGRESS NOTE  Stephen Holland WGN:[id number] DOB: 1951/08/24 DOA: 03/01/2021 PCP: [name], MD  Brief History    20yom PMH HTN, hyperlipidemia, CKD, OSA presented w/ left arm redness and swelling. Admitted for cellulitis of LUE and AKI.  Edema and erythema worsened on cefazolin, changed to Cubicin.  Infectious disease consulted.  MRI pending to assess for deep fluid collections.  If abscesses found, will consult orthopedics.  A & P  Cellulitis LUE. Left elbow film unremarkable.  --More edematous today and erythematous.  Appears to have developing abscess over the olecranon. --Perfusion of the arm and hand appears to be intact.  Sensation was grossly intact.  No pallor.  AKI superimposed on CKD stage IIIa --hold telmisartan  --Mildly worse today.  FENa suggests intrinsic etiology.  Thrombocytopenia --Trending up.  Secondary to acute infection.  Monitor periodically.  Essential HTN --Remains stable.  Continue bisoprolol  Hyperlipidemia  --continue rosuvastatin   Gout --continue allopurinol   COPD, smoker --Remains stable. --recommend smoking cessation.  OSA --continue CPAP QHS  Disposition Plan:  Discussion: Worse today and I suspect he is developing an abscess over the olecranon, however it is quite curious that the predominant erythema is over the volar aspect of the forearm.  Proceed with MRI today of the forearm including the olecranon to assess for deep fluid collections.  Pain is fairly minimal, there are no signs or symptoms to suggest compartment syndrome.  ID consultation.  Given failure cefazolin changed to Cubicin.  Dispo: The patient is from: Home              Anticipated d/c is to: Home              Patient currently is not medically stable to d/c.   Difficult to place patient No  DVT prophylaxis: SCDs Start: 03/01/21 1935   Code Status: Full Code Level of care: Med-Surg Family Communication:   Murray Hodgkins, MD  [facility] Direct contact:  see [email or web address] (further directions at bottom of note if needed) 7PM-7AM contact night coverage as at bottom of note 03/03/2021, 3:29 PM  LOS: 0 days   Significant Hospital Events   . 4/3 admitted for cellulitis left arm   Consults:  . ID   Procedures:  .   Significant Diagnostic Tests:  . Left elbow x-ray unremarkable . Chest x-ray negative   Micro Data:  . 4/3 blood cultures no growth to date   Antimicrobials:  . Cefazolin 4/3 >  .   Interval History/Subjective  CC: f/u left arm cellulitis  Left arm worse today, more swelling.  Mild pain and has normal sensation in left hand.  Erythema of the forearm appears worse today. Urine less dark today.  Objective   Vitals:  Vitals:   03/03/21 0537 03/03/21 1324  BP: (!) 142/86 (!) 160/82  Pulse: 71 71  Resp: 19 18  Temp: 99.9 F (37.7 C) 97.7 F (36.5 C)  SpO2: 92% 92%    Exam:  Constitutional:   . Appears calm and comfortable ENMT:  . Mildly hard of hearing Respiratory:  . CTA bilaterally, no w/r/r.  . Respiratory effort normal.  Cardiovascular:  . RRR, no m/r/g Musculoskeletal:  . Left upper extremity somewhat limited range of movement at the elbow and hand from edema.  Her strength appears grossly normal. Skin:  . There is increased erythema of the left forearm today primarily on the volar aspect.  The overlying skin of the olecranon and has 2 white areas that appear  to be small collections of pus.  There are no open wounds or drainage at this time.  Edema is substantial months difficult to appreciate fluctuance. Neurologic:  . Sensation of the left arm is grossly intact Psychiatric:  . Mental status o Mood, affect appropriate   I have personally reviewed the following:   Today's Data  Creatinine slightly up at 1.91, BUN down to 24, remainder BMP unremarkable Platelets up to 81.  Remainder CBC unremarkable. Urinalysis negative   Scheduled Meds: . bisoprolol  10 mg Oral q morning  . gabapentin  600 mg  Oral BID   Continuous Infusions: . sodium chloride 150 mL/hr at 03/03/21 0533  . DAPTOmycin (CUBICIN)  IV 500 mg (03/03/21 1216)    Principal Problem:   Cellulitis of left upper extremity Active Problems:   Essential hypertension   Hyperlipidemia, mixed   OSA and COPD overlap syndrome (HCC)   Acute kidney injury superimposed on CKD (HCC)   Thrombocytopenia (HCC)   Cellulitis of left arm   LOS: 0 days   How to contact the [facility] Attending or Consulting provider [place] or covering provider during after hours [place], for this patient?  1. Check the care team in [facility] and look for a) attending/consulting TR[facility] provider listed and b) the [facility] team listed 2. Log into [email or web address] and use [place]'s universal password to access. If you do not have the password, please contact the hospital operator. 3. Locate the [facility] provider you are looking for under [facility] and page to a number that you can be directly reached. 4. If you still have difficulty reaching the provider, please page the [facility] (Director on Call) for the Hospitalists listed on amion for assistance.

## 2021-03-03 NOTE — Progress Notes (Signed)
Upper extremity venous has been completed.   Preliminary results in CV Proc.   Abram Sander 03/03/2021 10:20 AM

## 2021-03-04 ENCOUNTER — Other Ambulatory Visit (HOSPITAL_COMMUNITY): Payer: Self-pay

## 2021-03-04 DIAGNOSIS — L03114 Cellulitis of left upper limb: Secondary | ICD-10-CM | POA: Diagnosis not present

## 2021-03-04 DIAGNOSIS — N179 Acute kidney failure, unspecified: Secondary | ICD-10-CM | POA: Diagnosis not present

## 2021-03-04 DIAGNOSIS — E782 Mixed hyperlipidemia: Secondary | ICD-10-CM | POA: Diagnosis not present

## 2021-03-04 DIAGNOSIS — N189 Chronic kidney disease, unspecified: Secondary | ICD-10-CM | POA: Diagnosis not present

## 2021-03-04 LAB — CBC
HCT: 43.8 % (ref 39.0–52.0)
Hemoglobin: 14.3 g/dL (ref 13.0–17.0)
MCH: 32.6 pg (ref 26.0–34.0)
MCHC: 32.6 g/dL (ref 30.0–36.0)
MCV: 100 fL (ref 80.0–100.0)
Platelets: 86 10*3/uL — ABNORMAL LOW (ref 150–400)
RBC: 4.38 MIL/uL (ref 4.22–5.81)
RDW: 14.7 % (ref 11.5–15.5)
WBC: 8.6 10*3/uL (ref 4.0–10.5)
nRBC: 0 % (ref 0.0–0.2)

## 2021-03-04 LAB — BASIC METABOLIC PANEL
Anion gap: 9 (ref 5–15)
BUN: 23 mg/dL (ref 8–23)
CO2: 19 mmol/L — ABNORMAL LOW (ref 22–32)
Calcium: 8.3 mg/dL — ABNORMAL LOW (ref 8.9–10.3)
Chloride: 113 mmol/L — ABNORMAL HIGH (ref 98–111)
Creatinine, Ser: 1.89 mg/dL — ABNORMAL HIGH (ref 0.61–1.24)
GFR, Estimated: 38 mL/min — ABNORMAL LOW (ref >60)
Glucose, Bld: 92 mg/dL (ref 70–99)
Potassium: 3.8 mmol/L (ref 3.5–5.1)
Sodium: 141 mmol/L (ref 135–145)

## 2021-03-04 LAB — HEMOGLOBIN A1C
Hgb A1c MFr Bld: 6.1 % — ABNORMAL HIGH (ref 4.8–5.6)
Mean Plasma Glucose: 128.37 mg/dL

## 2021-03-04 LAB — CK: Total CK: 88 U/L (ref 49–397)

## 2021-03-04 MED ORDER — ZOLPIDEM TARTRATE 5 MG PO TABS
5.0000 mg | ORAL_TABLET | Freq: Every evening | ORAL | Status: DC | PRN
  Administered 2021-03-04 – 2021-03-05 (×2): 5 mg via ORAL
  Filled 2021-03-04 (×2): qty 1

## 2021-03-04 MED ORDER — TRAMADOL HCL 50 MG PO TABS
50.0000 mg | ORAL_TABLET | Freq: Four times a day (QID) | ORAL | Status: DC | PRN
Start: 2021-03-04 — End: 2021-03-06
  Administered 2021-03-04 – 2021-03-05 (×3): 50 mg via ORAL
  Filled 2021-03-04 (×3): qty 1

## 2021-03-04 MED ORDER — DEXTROSE 5 % IV SOLN: INTRAVENOUS | Status: DC

## 2021-03-04 NOTE — Evaluation (Signed)
Physical Therapy Evaluation Patient Details Name: Stephen Holland MRN: [id number] DOB: 1950-12-13 Today's Date: 03/04/2021   History of Present Illness  70 y.o. male past medical history of essential hypertension, hyperlipidemia, chronic kidney C stage III obstructive sleep apnea presents with left arm redness and swelling, diagnosed with left upper extremity cellulitis and acute kidney injury  Clinical Impression  Pt admitted with above diagnosis.  Reviewed UE ROM/HEP with pt and wife, reviewed importance of positioning and elevation which pt is compliant with  Will recheck to reinforce HEP  Pt currently with functional limitations due to the deficits listed below (see PT Problem List). Pt will benefit from skilled PT to increase their independence and safety with mobility to allow discharge to the venue listed below.       Follow Up Recommendations No PT follow up    Equipment Recommendations  None recommended by PT    Recommendations for Other Services       Precautions / Restrictions Precautions Precautions: Fall Restrictions Weight Bearing Restrictions: No      Mobility  Bed Mobility Overal bed mobility: Modified Independent                  Transfers                 General transfer comment: NT at this time, pt reports no difficulty  Ambulation/Gait                Stairs            Wheelchair Mobility    Modified Rankin (Stroke Patients Only)       Balance                                             Pertinent Vitals/Pain Pain Assessment: 0-10 Pain Score: 7  Pain Location: LUE Pain Descriptors / Indicators: Sore;Jabbing Pain Intervention(s): Limited activity within patient's tolerance;Monitored during session;Repositioned    Home Living Family/patient expects to be discharged to:: Private residence Living Arrangements: Spouse/significant other Available Help at Discharge: Family Type of Home: House                 Prior Function Level of Independence: Independent               Hand Dominance        Extremity/Trunk Assessment   Upper Extremity Assessment Upper Extremity Assessment: LUE deficits/detail LUE Deficits / Details: edematous, erythematous LUE. AAROM grossly WFL except full elbow flexion limited to ~ 100 degrees, wrist extension ~ 5 degrees d/t swelling and pain            Communication   Communication: No difficulties  Cognition Arousal/Alertness: Awake/alert Behavior During Therapy: WFL for tasks assessed/performed Overall Cognitive Status: Within Functional Limits for tasks assessed                                        General Comments      Exercises General Exercises - Upper Extremity Shoulder Flexion: AAROM;Left;10 reps Elbow Flexion: AROM;10 reps;Left Elbow Extension: AROM;Left;10 reps Wrist Flexion: AROM;Left;10 reps Wrist Extension: AROM;Left;10 reps Shoulder Exercises Shoulder Flexion: AROM;AAROM;Left;10 reps;Seated Hand Exercises Forearm Supination: AROM;Left;10 reps Forearm Pronation: AROM;10 reps;Left Wrist Flexion: AROM;10 reps;Left Wrist Extension: AROM;10 reps;Left Wrist Ulnar Deviation: AROM;5  reps;Left Digit Composite Flexion: AROM;5 reps;Left Composite Extension: AROM;5 reps;Left   Assessment/Plan    PT Assessment Patient needs continued PT services  PT Problem List Decreased range of motion;Decreased activity tolerance       PT Treatment Interventions Therapeutic exercise;Patient/family education    PT Goals (Current goals can be found in the Care Plan section)  Acute Rehab PT Goals Patient Stated Goal: to get out of the hospital PT Goal Formulation: With patient Time For Goal Achievement: 03/11/21 Potential to Achieve Goals: Good    Frequency Min 2X/week   Barriers to discharge        Co-evaluation               AM-PAC PT "6 Clicks" Mobility  Outcome Measure Help needed turning from  your back to your side while in a flat bed without using bedrails?: None Help needed moving from lying on your back to sitting on the side of a flat bed without using bedrails?: None Help needed moving to and from a bed to a chair (including a wheelchair)?: None Help needed standing up from a chair using your arms (e.g., wheelchair or bedside chair)?: None Help needed to walk in hospital room?: A Little Help needed climbing 3-5 steps with a railing? : A Little 6 Click Score: 22    End of Session Equipment Utilized During Treatment: Gait belt Activity Tolerance: Patient tolerated treatment well Patient left: with call bell/phone within reach;with family/visitor present;in bed        Time: 1430-1450 PT Time Calculation (min) (ACUTE ONLY): 20 min   Charges:   PT Evaluation $PT Eval Low Complexity: Wilder, PT  Acute Rehab Dept ([place]) [phone] Pager [phone]  03/04/2021   [facility] 03/04/2021, 4:55 PM

## 2021-03-04 NOTE — Progress Notes (Addendum)
[facility]  Date of Admission:  03/01/2021      CC: Severe Sepsis without septic shock Purulent cellulitis/soft tissue infection   Lines:  Peripheral iv's  Abx: 4/05-c daptomycin  4/03-4/05 cefazolin                                                        Assessment: 70 yo male smoker, ckd3, htn/hlp, osa/cpap admitted 4/03 for 2 days acute progressive pain/redness/swelling of the left elbow clinically consistent with purulent ssi  Mri reviewed no evidence of septic olecranon bursitis or septic arthritis, consistent with clinical exam  Blood culture negative  Patient stable/slow improvement on daptomycin  Potentially could discharge with the follow plan  Plan: 1. If remaining in house, would keep on daptomycin as is for now 2. If plan to discharge, and from ID standpoint, very reasonable to, could do below 3. dalbavancin on the day of discharge once, and give 2 weeks of PO either tedizolid 200 mg bid or linezolid 600 mg po bid (preference to the former if finance allows) to start 5 days after the dalbavancin shot 4. F/u with [facility] Dr Megan Salon on 4/20 @ 74 am   @  [facility] [place], [place], [place] [address] Phone: [phone]    I spent more than 35 minute reviewing data/chart, and coordinating care and >50% direct face to face time providing counseling/discussing diagnostics/treatment plan with patient   -------------------  Principal Problem:   Cellulitis of left upper extremity Active Problems:   Essential hypertension   Hyperlipidemia, mixed   OSA and COPD overlap syndrome (HCC)   Acute kidney injury superimposed on CKD (HCC)   Thrombocytopenia (HCC)   Cellulitis of left arm   CKD (chronic kidney disease), stage III (HCC)   Septic olecranon bursitis of left elbow   Severe sepsis ([place])   No Known Allergies  Scheduled Meds: . bisoprolol  10 mg Oral q morning  . gabapentin  600 mg Oral  BID   Continuous Infusions: . DAPTOmycin (CUBICIN)  IV 500 mg (03/03/21 1216)  . dextrose 75 mL/hr at 03/04/21 1122   PRN Meds:.acetaminophen **OR** acetaminophen, albuterol, ondansetron **OR** ondansetron (ZOFRAN) IV, traMADol, zolpidem   SUBJECTIVE: Left arm looks the same to him, not worse No fever, chill, n/v/diarrhea No joint pain Mri left forearm reviewed no abscess/olecronon bursitis/septic arthritis Blood culture remains negative  Review of Systems: ROS All other ROS was negative, except mentioned above     OBJECTIVE: Vitals:   03/03/21 1324 03/04/21 0105 03/04/21 0519 03/04/21 1340  BP: (!) 160/82 125/77 136/73 (!) 183/86  Pulse: 71 69 64 77  Resp: 18 15 17    Temp: 97.7 F (36.5 C) 97.7 F (36.5 C) 98.6 F (37 C) 98.1 F (36.7 C)  TempSrc: Oral Oral Oral Oral  SpO2: 92% (!) 88% 95% 92%  Weight:      Height:       Body mass index is 31.94 kg/m.  Physical Exam General/constitutional: no distress, pleasant HEENT: Normocephalic, PER, Conj Clear, EOMI, Oropharynx clear Neck supple CV: rrr no mrg Lungs: clear to auscultation, normal respiratory effort Abd: Soft, Nontender Ext: no edema Skin: No Rash Neuro: nonfocal MSK: left forearm stable swelling/erythema, slight purulence/pustule just distal to the olecronon bursa  area; intact extension/flexion of the elbow; not particularly tender on palpation over the olecronon area    Lab Results Lab Results  Component Value Date   WBC 8.6 03/04/2021   HGB 14.3 03/04/2021   HCT 43.8 03/04/2021   MCV 100.0 03/04/2021   PLT 86 (L) 03/04/2021    Lab Results  Component Value Date   CREATININE 1.89 (H) 03/04/2021   BUN 23 03/04/2021   NA 141 03/04/2021   K 3.8 03/04/2021   CL 113 (H) 03/04/2021   CO2 19 (L) 03/04/2021    Lab Results  Component Value Date   ALT 22 12/03/2020   AST 24 12/03/2020   ALKPHOS 58 06/13/2017   BILITOT 0.5 12/03/2020      Microbiology: Recent Results (from the past 240  hour(s))  Resp Panel by RT-PCR (Flu A&B, Covid) Nasopharyngeal Swab     Status: None   Collection Time: 03/01/21  5:44 PM   Specimen: Nasopharyngeal Swab; Nasopharyngeal(NP) swabs in vial transport medium  Result Value Ref Range Status   SARS Coronavirus 2 by RT PCR NEGATIVE NEGATIVE Final    Comment: (NOTE) SARS-CoV-2 target nucleic acids are NOT DETECTED.  The SARS-CoV-2 RNA is generally detectable in upper respiratory specimens during the acute phase of infection. The lowest concentration of SARS-CoV-2 viral copies this assay can detect is 138 copies/mL. A negative result does not preclude SARS-Cov-2 infection and should not be used as the sole basis for treatment or other patient management decisions. A negative result may occur with  improper specimen collection/handling, submission of specimen other than nasopharyngeal swab, presence of viral mutation(s) within the areas targeted by this assay, and inadequate number of viral copies(<138 copies/mL). A negative result must be combined with clinical observations, patient history, and epidemiological information. The expected result is Negative.  Fact Sheet for Patients:  EntrepreneurPulse.com.au  Fact Sheet for Healthcare Providers:  IncredibleEmployment.be  This test is no t yet approved or cleared by the [place] FDA and  has been authorized for detection and/or diagnosis of SARS-CoV-2 by FDA under an Emergency Use Authorization (EUA). This EUA will remain  in effect (meaning this test can be used) for the duration of the COVID-19 declaration under Section 564(b)(1) of the Act, 21 U.S.C.section 360bbb-3(b)(1), unless the authorization is terminated  or revoked sooner.       Influenza A by PCR NEGATIVE NEGATIVE Final   Influenza B by PCR NEGATIVE NEGATIVE Final    Comment: (NOTE) The Xpert Xpress SARS-CoV-2/FLU/RSV plus assay is intended as an aid in the diagnosis of influenza from  Nasopharyngeal swab specimens and should not be used as a sole basis for treatment. Nasal washings and aspirates are unacceptable for Xpert Xpress SARS-CoV-2/FLU/RSV testing.  Fact Sheet for Patients: EntrepreneurPulse.com.au  Fact Sheet for Healthcare Providers: IncredibleEmployment.be  This test is not yet approved or cleared by the [place] FDA and has been authorized for detection and/or diagnosis of SARS-CoV-2 by FDA under an Emergency Use Authorization (EUA). This EUA will remain in effect (meaning this test can be used) for the duration of the COVID-19 declaration under Section 564(b)(1) of the Act, 21 U.S.C. section 360bbb-3(b)(1), unless the authorization is terminated or revoked.  Performed at [facility], [place] [address], [place], [place] [address]   Culture, blood (routine x 2)     Status: None (Preliminary result)   Collection Time: 03/01/21  5:59 PM   Specimen: BLOOD  Result Value Ref Range Status   Specimen Description  Final    BLOOD SITE NOT SPECIFIED Performed at [facility], [place] [address] [place], [place] [address]    Special Requests   Final    BOTTLES DRAWN AEROBIC AND ANAEROBIC Blood Culture adequate volume Performed at [facility] [address], [place], [place] [address]    Culture   Final    NO GROWTH 2 DAYS Performed at [facility] [address] [place], [place] [address]    Report Status PENDING  Incomplete  Culture, blood (routine x 2)     Status: None (Preliminary result)   Collection Time: 03/01/21  6:03 PM   Specimen: BLOOD  Result Value Ref Range Status   Specimen Description BLOOD SITE NOT SPECIFIED  Final   Special Requests   Final    BOTTLES DRAWN AEROBIC AND ANAEROBIC Blood Culture adequate volume   Culture   Final    NO GROWTH 2 DAYS Performed at [facility], [address], [place], [place] [address]    Report Status  PENDING  Incomplete     Serology:   Imaging: If present, new imagings (plain films, ct scans, and mri) have been personally visualized and interpreted; radiology reports have been reviewed. Decision making incorporated into the Impression / Recommendations.  4/5 mri left forearm Findings of extensive cellulitis with non loculated fluid within the subcutaneous soft tissues which could be phlegmon/early abscess. No definite evidence of osteomyelitis  Diffuse mild muscular edema/myositis most notable posteriorly.   Jabier Mutton, [facility] [place] [phone] pager    03/04/2021, 2:34 PM

## 2021-03-04 NOTE — Progress Notes (Addendum)
[facility] PROGRESS NOTE    Progress Note  Stephen Holland  CBS:[id number] DOB: Mar 25, 1951 DOA: 03/01/2021 PCP: [name], MD     Brief Narrative:   Stephen Holland is an 70 y.o. 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 03/02/2021 chronic renal disease bilaterally no hydronephrosis. 03/02/2021 MRI of the left forearm ending is consistent with extensive cellulitis with nonloculated fluid within the subcutaneous soft tissue no evidence of osteo-.  Antibiotics: 03/01/2021 clindamycin 03/03/2021 03/01/2021 cefazolin 04/17/2021 03/04/2021 IV daptomycin  Microbiology data: Blood culture:  Procedures: None  Assessment/Plan:   Cellulitis of left upper extremity Neurovascular integrity of the hand is intact. MRI show nonloculated fluid question early edema phlegmon, no septic arthritis Infectious disease on board appreciate assistance. Antibiotics changed to IV daptomycin Arm continues to be swollen wrinkled and desquamating he is doing a great job keeping arm elevated above the heart level.  Acute on chronic kidney C stage IIIa: In the setting of ARB. With baseline creatinine 1.3-1.5, on admission 2.3. Started on IV fluids now 1.8.  Acute thrombocytopenia: Trending up in the setting of acute infectious etiology.  Essential hypertension: Hold ARB, continue bisoprolol.  Hyperlipidemia: Continue statins.  Gout: Continue allopurinol.  COPD: Ongoing tobacco abuse, counseling.  Obstructive sleep apnea: Continue CPAP at night.     DVT prophylaxis: lovenxo Family Communication:none Status is: Inpatient  Remains  inpatient appropriate because:Hemodynamically unstable   Dispo: The patient is from: Home              Anticipated d/c is to: Home              Patient currently is not medically stable to d/c.   Difficult to place patient No        Code Status:     Code Status Orders  (From admission, onward)         Start     Ordered   03/01/21 1932  Full code  Continuous        03/01/21 1933        Code Status History    This patient has a current code status but no historical code status.   Advance Care Planning Activity    Advance Directive Documentation   Flowsheet Row Most Recent Value  Type of Advance Directive Healthcare Power of Attorney, Living will  Pre-existing out of facility DNR order (yellow form or pink MOST form) --  "MOST" Form in Place? --        IV Access:    Peripheral IV   Procedures and diagnostic studies:   US RENAL  Result Date: 03/03/2021 CLINICAL DATA:  Acute kidney injury. EXAM: RENAL / URINARY TRACT ULTRASOUND COMPLETE COMPARISON:  February 21, 2020. FINDINGS: Right Kidney: Renal measurements: 11.2 x 5.8 x 5.2 cm = volume: 178 mL. Increased echogenicity of renal parenchyma is noted suggesting medical renal disease. No mass or hydronephrosis visualized. Left Kidney: Renal measurements: 12.8 x 6.0 x 6.0 cm = volume: 242 mL. Two simple cysts are noted in the lower pole, with the largest measuring 1.5 cm. Increased echogenicity of renal parenchyma is noted suggesting medical renal disease. No mass or hydronephrosis visualized. Bladder: Appears normal for degree of bladder distention.  Ureteral jets are not visualized. Other: Exam was somewhat limited as the patient had difficulty holding breath. IMPRESSION: Increased echogenicity of renal parenchyma is noted bilaterally suggesting medical renal disease. No hydronephrosis or renal obstruction is noted. Electronically Signed   By: Marijo Conception M.D.   On: 03/03/2021 11:52   MR FOREARM LEFT WO  CONTRAST  Result Date: 03/03/2021 CLINICAL DATA:  Arm pain, fever EXAM: MRI OF THE LEFT FOREARM WITHOUT CONTRAST TECHNIQUE: Multiplanar, multisequence MR imaging of the left was performed. No intravenous contrast was administered. COMPARISON:  None. FINDINGS: Bones/Joint/Cartilage No fracture or dislocation. No areas cortical destruction periosteal reaction is seen. Small elbow joint effusion is present. Ligaments Suboptimally visualized Muscles and Tendons Mildly increased feathery signal seen within the muscles of the forearm, most notable seen posteriorly. No loculated fluid collections however are seen. The flexor and extensor tendons intact. Soft tissues Extensive diffuse subcutaneous edema seen surrounding the forearm. There is non loculated fluid seen within the fascial layers extending into the deep fascial layers. This subcutaneous emphysema however is noted. IMPRESSION: Findings of extensive cellulitis with non loculated fluid within the subcutaneous soft tissues which could be phlegmon/early abscess. No definite evidence of osteomyelitis Diffuse mild muscular edema/myositis most notable posteriorly. Electronically Signed   By: Prudencio Pair M.D.   On: 03/03/2021 23:45   VAS [place] UPPER EXTREMITY VENOUS DUPLEX  Result Date: 03/04/2021 UPPER VENOUS STUDY  Indications: Edema Comparison Study: no prior Performing Technologist: Abram Sander RVS  Examination Guidelines: A complete evaluation includes B-mode imaging, spectral Doppler, color Doppler, and power Doppler as needed of all accessible portions of each vessel. Bilateral testing is considered an integral part of a complete examination. Limited examinations for reoccurring indications may be performed as noted.  Left Findings: +----------+------------+---------+-----------+----------+-------+ LEFT      CompressiblePhasicitySpontaneousPropertiesSummary +----------+------------+---------+-----------+----------+-------+ IJV           Full       Yes        Yes                      +----------+------------+---------+-----------+----------+-------+ Subclavian    Full       Yes       Yes                      +----------+------------+---------+-----------+----------+-------+ Axillary      Full       Yes       Yes                      +----------+------------+---------+-----------+----------+-------+ Brachial      Full       Yes       Yes                      +----------+------------+---------+-----------+----------+-------+ Radial        Full                                          +----------+------------+---------+-----------+----------+-------+ Ulnar         Full                                          +----------+------------+---------+-----------+----------+-------+ Cephalic      Full                                          +----------+------------+---------+-----------+----------+-------+  Basilic       Full                                          +----------+------------+---------+-----------+----------+-------+  Summary:  Left: No evidence of deep vein thrombosis in the upper extremity. No evidence of superficial vein thrombosis in the upper extremity. No evidence of thrombosis in the subclavian.  *See table(s) above for measurements and observations.  Diagnosing physician: Curt Jews MD Electronically signed by Curt Jews MD on 03/04/2021 at 6:02:40 AM.    Final      Medical Consultants:    None.   Subjective:    Stephen Holland pain is improved.  Objective:    Vitals:   03/03/21 0537 03/03/21 1324 03/04/21 0105 03/04/21 0519  BP: (!) 142/86 (!) 160/82 125/77 136/73  Pulse: 71 71 69 64  Resp: 19 18 15 17   Temp: 99.9 F (37.7 C) 97.7 F (36.5 C) 97.7 F (36.5 C) 98.6 F (37 C)  TempSrc: Oral Oral Oral Oral  SpO2: 92% 92% (!) 88% 95%  Weight:      Height:       SpO2: 95 % O2 Flow Rate (L/min): 0 L/min   Intake/Output Summary (Last 24 hours) at 03/04/2021 0826 Last data  filed at 03/04/2021 0600 Gross per 24 hour  Intake 3022.78 ml  Output --  Net 3022.78 ml   Filed Weights   03/01/21 2001  Weight: 92.5 kg    Exam: General exam: In no acute distress. Respiratory system: Good air movement and clear to auscultation. Cardiovascular system: S1 & S2 heard, RRR. No JVD. Gastrointestinal system: Abdomen is nondistended, soft and nontender.  Extremities: No pedal edema. Skin: left arrm mid forearm to mid humereous swollen, tender to palpation and erythematous. Psychiatry: Judgement and insight appear normal. Mood & affect appropriate.    Data Reviewed:    Labs: Basic Metabolic Panel: Recent Labs  Lab 03/01/21 1534 03/02/21 0440 03/03/21 0510 03/04/21 0424  NA 138 139 142 141  K 4.6 4.2 4.0 3.8  CL 107 107 111 113*  CO2 25 23 23  19*  GLUCOSE 115* 113* 109* 92  BUN 31* 32* 24* 23  CREATININE 2.31* 1.71* 1.91* 1.89*  CALCIUM 9.2 8.8* 8.6* 8.3*   GFR Estimated Creatinine Clearance: 39.5 mL/min (A) (by C-G formula based on SCr of 1.89 mg/dL (H)). Liver Function Tests: No results for input(s): AST, ALT, ALKPHOS, BILITOT, PROT, ALBUMIN in the last 168 hours. No results for input(s): LIPASE, AMYLASE in the last 168 hours. No results for input(s): AMMONIA in the last 168 hours. Coagulation profile No results for input(s): INR, PROTIME in the last 168 hours. COVID-19 Labs  No results for input(s): DDIMER, FERRITIN, LDH, CRP in the last 72 hours.  Lab Results  Component Value Date   [place] NEGATIVE 03/01/2021    CBC: Recent Labs  Lab 03/01/21 1534 03/02/21 0440 03/03/21 0510 03/04/21 0424  WBC 12.4* 10.5 9.6 8.6  NEUTROABS 10.0*  --   --   --   HGB 16.9 15.7 14.2 14.3  HCT 51.4 47.6 44.4 43.8  MCV 100.2* 99.6 100.9* 100.0  PLT 77* 73* 81* 86*   Cardiac Enzymes: Recent Labs  Lab 03/04/21 0424  CKTOTAL 88   BNP (last 3 results) No results for input(s): PROBNP in the last 8760 hours. CBG: No results for input(s): GLUCAP  in the last  168 hours. D-Dimer: No results for input(s): DDIMER in the last 72 hours. Hgb A1c: No results for input(s): HGBA1C in the last 72 hours. Lipid Profile: No results for input(s): CHOL, HDL, LDLCALC, TRIG, CHOLHDL, LDLDIRECT in the last 72 hours. Thyroid function studies: No results for input(s): TSH, T4TOTAL, T3FREE, THYROIDAB in the last 72 hours.  Invalid input(s): FREET3 Anemia work up: No results for input(s): VITAMINB12, FOLATE, FERRITIN, TIBC, IRON, RETICCTPCT in the last 72 hours. Sepsis Labs: Recent Labs  Lab 03/01/21 1534 03/01/21 1535 03/02/21 0440 03/03/21 0510 03/04/21 0424  WBC 12.4*  --  10.5 9.6 8.6  LATICACIDVEN  --  1.3  --   --   --    Microbiology Recent Results (from the past 240 hour(s))  Resp Panel by RT-PCR (Flu A&B, Covid) Nasopharyngeal Swab     Status: None   Collection Time: 03/01/21  5:44 PM   Specimen: Nasopharyngeal Swab; Nasopharyngeal(NP) swabs in vial transport medium  Result Value Ref Range Status   SARS Coronavirus 2 by RT PCR NEGATIVE NEGATIVE Final    Comment: (NOTE) SARS-CoV-2 target nucleic acids are NOT DETECTED.  The SARS-CoV-2 RNA is generally detectable in upper respiratory specimens during the acute phase of infection. The lowest concentration of SARS-CoV-2 viral copies this assay can detect is 138 copies/mL. A negative result does not preclude SARS-Cov-2 infection and should not be used as the sole basis for treatment or other patient management decisions. A negative result may occur with  improper specimen collection/handling, submission of specimen other than nasopharyngeal swab, presence of viral mutation(s) within the areas targeted by this assay, and inadequate number of viral copies(<138 copies/mL). A negative result must be combined with clinical observations, patient history, and epidemiological information. The expected result is Negative.  Fact Sheet for Patients:   EntrepreneurPulse.com.au  Fact Sheet for Healthcare Providers:  IncredibleEmployment.be  This test is no t yet approved or cleared by the [place] FDA and  has been authorized for detection and/or diagnosis of SARS-CoV-2 by FDA under an Emergency Use Authorization (EUA). This EUA will remain  in effect (meaning this test can be used) for the duration of the COVID-19 declaration under Section 564(b)(1) of the Act, 21 U.S.C.section 360bbb-3(b)(1), unless the authorization is terminated  or revoked sooner.       Influenza A by PCR NEGATIVE NEGATIVE Final   Influenza B by PCR NEGATIVE NEGATIVE Final    Comment: (NOTE) The Xpert Xpress SARS-CoV-2/FLU/RSV plus assay is intended as an aid in the diagnosis of influenza from Nasopharyngeal swab specimens and should not be used as a sole basis for treatment. Nasal washings and aspirates are unacceptable for Xpert Xpress SARS-CoV-2/FLU/RSV testing.  Fact Sheet for Patients: EntrepreneurPulse.com.au  Fact Sheet for Healthcare Providers: IncredibleEmployment.be  This test is not yet approved or cleared by the [place] FDA and has been authorized for detection and/or diagnosis of SARS-CoV-2 by FDA under an Emergency Use Authorization (EUA). This EUA will remain in effect (meaning this test can be used) for the duration of the COVID-19 declaration under Section 564(b)(1) of the Act, 21 U.S.C. section 360bbb-3(b)(1), unless the authorization is terminated or revoked.  Performed at [facility], [place] [address] [place], [place] [address]   Culture, blood (routine x 2)     Status: None (Preliminary result)   Collection Time: 03/01/21  5:59 PM   Specimen: BLOOD  Result Value Ref Range Status   Specimen Description   Final    BLOOD SITE  NOT SPECIFIED Performed at [facility], [place] [address] [place], [place] [address]    Special  Requests   Final    BOTTLES DRAWN AEROBIC AND ANAEROBIC Blood Culture adequate volume Performed at [facility] [address], [place], [place] [address]    Culture   Final    NO GROWTH 2 DAYS Performed at [facility] [address] [place], [place] [address]    Report Status PENDING  Incomplete  Culture, blood (routine x 2)     Status: None (Preliminary result)   Collection Time: 03/01/21  6:03 PM   Specimen: BLOOD  Result Value Ref Range Status   Specimen Description BLOOD SITE NOT SPECIFIED  Final   Special Requests   Final    BOTTLES DRAWN AEROBIC AND ANAEROBIC Blood Culture adequate volume   Culture   Final    NO GROWTH 2 DAYS Performed at [facility], [address] [place], [place] [address]    Report Status PENDING  Incomplete     Medications:   . bisoprolol  10 mg Oral q morning  . gabapentin  600 mg Oral BID   Continuous Infusions: . sodium chloride 150 mL/hr at 03/04/21 0413  . DAPTOmycin (CUBICIN)  IV 500 mg (03/03/21 1216)      LOS: 1 day   Charlynne Cousins  [facility]  03/04/2021, 8:26 AM

## 2021-03-05 DIAGNOSIS — L03114 Cellulitis of left upper limb: Secondary | ICD-10-CM | POA: Diagnosis not present

## 2021-03-05 DIAGNOSIS — N179 Acute kidney failure, unspecified: Secondary | ICD-10-CM | POA: Diagnosis not present

## 2021-03-05 DIAGNOSIS — N189 Chronic kidney disease, unspecified: Secondary | ICD-10-CM | POA: Diagnosis not present

## 2021-03-05 DIAGNOSIS — A419 Sepsis, unspecified organism: Secondary | ICD-10-CM

## 2021-03-05 DIAGNOSIS — E782 Mixed hyperlipidemia: Secondary | ICD-10-CM | POA: Diagnosis not present

## 2021-03-05 MED ORDER — LINEZOLID 600 MG PO TABS
600.0000 mg | ORAL_TABLET | Freq: Two times a day (BID) | ORAL | Status: DC
  Administered 2021-03-05: 600 mg via ORAL
  Filled 2021-03-05: qty 1

## 2021-03-05 MED ORDER — CLINDAMYCIN PHOSPHATE 600 MG/50ML IV SOLN: 600.0000 mg | Freq: Three times a day (TID) | INTRAVENOUS | Status: DC

## 2021-03-05 MED ORDER — LINEZOLID 600 MG PO TABS
600.0000 mg | ORAL_TABLET | Freq: Two times a day (BID) | ORAL | Status: DC
  Administered 2021-03-05 – 2021-03-06 (×2): 600 mg via ORAL
  Filled 2021-03-05 (×2): qty 1

## 2021-03-05 NOTE — Progress Notes (Addendum)
[facility] PROGRESS NOTE    Progress Note  Stephen Holland  WGN:[id number] DOB: Oct 13, 1951 DOA: 03/01/2021 PCP: [name], MD     Brief Narrative:   Stephen Holland is an 70 y.o. 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 03/02/2021 chronic renal disease bilaterally no hydronephrosis. 03/02/2021 MRI of the left forearm ending is consistent with extensive cellulitis with nonloculated fluid within the subcutaneous soft tissue no evidence of osteo-.  Antibiotics: 03/01/2021 clindamycin 03/03/2021 03/01/2021 cefazolin 04/17/2021 03/04/2021 IV daptomycin  Microbiology data: Blood culture:  Procedures: None  Assessment/Plan:   Cellulitis of left upper extremity Neurovascular integrity of the hand is intact. MRI show nonloculated fluid question early edema phlegmon, no septic arthritis Infectious disease on board appreciate assistance. Continue IV daptomycin. ID recommended daptomycin can be given on the day of discharge followed by 2 weeks of oral linezolid. Follow-up with Dr. Megan Salon on 03/18/2021 at 8:45 AM.  Acute on chronic kidney C stage IIIa: In the setting of ARB. With baseline creatinine 1.3-1.5, on admission 2.3. KVO IV fluids creatinine is improved.  Acute thrombocytopenia: Likely due to acute infection.  Essential hypertension: Hold ARB, continue bisoprolol.  Hyperlipidemia: Continue statins.  Gout: Continue allopurinol.  COPD: Ongoing tobacco abuse, counseling.  Obstructive sleep apnea: Continue CPAP at night.     DVT prophylaxis: lovenxo Family Communication:none Status is: Inpatient  Remains  inpatient appropriate because:Hemodynamically unstable   Dispo: The patient is from: Home              Anticipated d/c is to: Home              Patient currently is not medically stable to d/c.   Difficult to place patient No        Code Status:     Code Status Orders  (From admission, onward)         Start     Ordered   03/01/21 1932  Full code  Continuous        03/01/21 1933        Code Status History    This patient has a current code status but no historical code status.   Advance Care Planning Activity    Advance Directive Documentation   Flowsheet Row Most Recent Value  Type of Advance Directive Healthcare Power of Attorney, Living will  Pre-existing out of facility DNR order (yellow form or pink MOST form) --  "MOST" Form in Place? --        IV Access:    Peripheral IV   Procedures and diagnostic studies:   US RENAL  Result Date: 03/03/2021 CLINICAL DATA:  Acute kidney injury. EXAM: RENAL / URINARY TRACT ULTRASOUND COMPLETE COMPARISON:  February 21, 2020. FINDINGS: Right Kidney: Renal measurements: 11.2 x 5.8 x 5.2 cm = volume: 178 mL. Increased echogenicity of renal parenchyma is noted suggesting medical renal disease. No mass or hydronephrosis visualized. Left Kidney: Renal measurements: 12.8 x 6.0 x 6.0 cm = volume: 242 mL. Two simple cysts are noted in the lower pole, with the largest measuring 1.5 cm. Increased echogenicity of renal parenchyma is noted suggesting medical renal disease. No mass or hydronephrosis visualized. Bladder: Appears normal for degree of bladder distention.  Ureteral jets are not visualized. Other: Exam was somewhat limited as the patient had difficulty holding breath. IMPRESSION: Increased echogenicity of renal parenchyma is noted bilaterally suggesting medical renal disease. No hydronephrosis or renal obstruction is noted. Electronically Signed   By: Marijo Conception M.D.   On: 03/03/2021 11:52   MR FOREARM LEFT WO  CONTRAST  Result Date: 03/03/2021 CLINICAL DATA:  Arm pain, fever EXAM: MRI OF THE LEFT FOREARM WITHOUT CONTRAST TECHNIQUE: Multiplanar, multisequence MR imaging of the left was performed. No intravenous contrast was administered. COMPARISON:  None. FINDINGS: Bones/Joint/Cartilage No fracture or dislocation. No areas cortical destruction periosteal reaction is seen. Small elbow joint effusion is present. Ligaments Suboptimally visualized Muscles and Tendons Mildly increased feathery signal seen within the muscles of the forearm, most notable seen posteriorly. No loculated fluid collections however are seen. The flexor and extensor tendons intact. Soft tissues Extensive diffuse subcutaneous edema seen surrounding the forearm. There is non loculated fluid seen within the fascial layers extending into the deep fascial layers. This subcutaneous emphysema however is noted. IMPRESSION: Findings of extensive cellulitis with non loculated fluid within the subcutaneous soft tissues which could be phlegmon/early abscess. No definite evidence of osteomyelitis Diffuse mild muscular edema/myositis most notable posteriorly. Electronically Signed   By: Prudencio Pair M.D.   On: 03/03/2021 23:45   VAS [place] UPPER EXTREMITY VENOUS DUPLEX  Result Date: 03/04/2021 UPPER VENOUS STUDY  Indications: Edema Comparison Study: no prior Performing Technologist: Abram Sander RVS  Examination Guidelines: A complete evaluation includes B-mode imaging, spectral Doppler, color Doppler, and power Doppler as needed of all accessible portions of each vessel. Bilateral testing is considered an integral part of a complete examination. Limited examinations for reoccurring indications may be performed as noted.  Left Findings: +----------+------------+---------+-----------+----------+-------+ LEFT      CompressiblePhasicitySpontaneousPropertiesSummary +----------+------------+---------+-----------+----------+-------+ IJV           Full       Yes        Yes                      +----------+------------+---------+-----------+----------+-------+ Subclavian    Full       Yes       Yes                      +----------+------------+---------+-----------+----------+-------+ Axillary      Full       Yes       Yes                      +----------+------------+---------+-----------+----------+-------+ Brachial      Full       Yes       Yes                      +----------+------------+---------+-----------+----------+-------+ Radial        Full                                          +----------+------------+---------+-----------+----------+-------+ Ulnar         Full                                          +----------+------------+---------+-----------+----------+-------+ Cephalic      Full                                          +----------+------------+---------+-----------+----------+-------+  Basilic       Full                                          +----------+------------+---------+-----------+----------+-------+  Summary:  Left: No evidence of deep vein thrombosis in the upper extremity. No evidence of superficial vein thrombosis in the upper extremity. No evidence of thrombosis in the subclavian.  *See table(s) above for measurements and observations.  Diagnosing physician: Curt Jews MD Electronically signed by Curt Jews MD on 03/04/2021 at 6:02:40 AM.    Final      Medical Consultants:    None.   Subjective:    Stephen Holland relates his pain is improved, but still there.  Objective:    Vitals:   03/04/21 1340 03/04/21 2026 03/04/21 2128 03/05/21 0542  BP: (!) 183/86 (!) 147/81  (!) 156/68  Pulse: 77 66  73  Resp:  16  18  Temp: 98.1 F (36.7 C) 99.9 F (37.7 C)    TempSrc: Oral Oral    SpO2: 92% (!) 87% 90% (!) 88%  Weight:      Height:       SpO2: (!) 88 % O2 Flow Rate (L/min): 0 L/min   Intake/Output Summary (Last 24 hours) at 03/05/2021 0917 Last data filed at  03/04/2021 1736 Gross per 24 hour  Intake 684.33 ml  Output --  Net 684.33 ml   Filed Weights   03/01/21 2001  Weight: 92.5 kg    Exam: General exam: In no acute distress. Respiratory system: Good air movement and clear to auscultation. Cardiovascular system: S1 & S2 heard, RRR. No JVD. Gastrointestinal system: Abdomen is nondistended, soft and nontender.  Extremities: No pedal edema. Skin: Patient left arm erythematous and swelling. Psychiatry: Judgement and insight appear normal. Mood & affect appropriate.    Data Reviewed:    Labs: Basic Metabolic Panel: Recent Labs  Lab 03/01/21 1534 03/02/21 0440 03/03/21 0510 03/04/21 0424  NA 138 139 142 141  K 4.6 4.2 4.0 3.8  CL 107 107 111 113*  CO2 25 23 23  19*  GLUCOSE 115* 113* 109* 92  BUN 31* 32* 24* 23  CREATININE 2.31* 1.71* 1.91* 1.89*  CALCIUM 9.2 8.8* 8.6* 8.3*   GFR Estimated Creatinine Clearance: 39.5 mL/min (A) (by C-G formula based on SCr of 1.89 mg/dL (H)). Liver Function Tests: No results for input(s): AST, ALT, ALKPHOS, BILITOT, PROT, ALBUMIN in the last 168 hours. No results for input(s): LIPASE, AMYLASE in the last 168 hours. No results for input(s): AMMONIA in the last 168 hours. Coagulation profile No results for input(s): INR, PROTIME in the last 168 hours. COVID-19 Labs  No results for input(s): DDIMER, FERRITIN, LDH, CRP in the last 72 hours.  Lab Results  Component Value Date   [place] NEGATIVE 03/01/2021    CBC: Recent Labs  Lab 03/01/21 1534 03/02/21 0440 03/03/21 0510 03/04/21 0424  WBC 12.4* 10.5 9.6 8.6  NEUTROABS 10.0*  --   --   --   HGB 16.9 15.7 14.2 14.3  HCT 51.4 47.6 44.4 43.8  MCV 100.2* 99.6 100.9* 100.0  PLT 77* 73* 81* 86*   Cardiac Enzymes: Recent Labs  Lab 03/04/21 0424  CKTOTAL 88   BNP (last 3 results) No results for input(s): PROBNP in the last 8760 hours. CBG: No results for input(s): GLUCAP in the last 168 hours. D-Dimer: No results for  input(s): DDIMER in the last 72 hours. Hgb A1c: Recent Labs    03/04/21 0424  HGBA1C 6.1*   Lipid Profile: No results for input(s): CHOL, HDL, LDLCALC, TRIG, CHOLHDL, LDLDIRECT in the last 72 hours. Thyroid function studies: No results for input(s): TSH, T4TOTAL, T3FREE, THYROIDAB in the last 72 hours.  Invalid input(s): FREET3 Anemia work up: No results for input(s): VITAMINB12, FOLATE, FERRITIN, TIBC, IRON, RETICCTPCT in the last 72 hours. Sepsis Labs: Recent Labs  Lab 03/01/21 1534 03/01/21 1535 03/02/21 0440 03/03/21 0510 03/04/21 0424  WBC 12.4*  --  10.5 9.6 8.6  LATICACIDVEN  --  1.3  --   --   --    Microbiology Recent Results (from the past 240 hour(s))  Resp Panel by RT-PCR (Flu A&B, Covid) Nasopharyngeal Swab     Status: None   Collection Time: 03/01/21  5:44 PM   Specimen: Nasopharyngeal Swab; Nasopharyngeal(NP) swabs in vial transport medium  Result Value Ref Range Status   SARS Coronavirus 2 by RT PCR NEGATIVE NEGATIVE Final    Comment: (NOTE) SARS-CoV-2 target nucleic acids are NOT DETECTED.  The SARS-CoV-2 RNA is generally detectable in upper respiratory specimens during the acute phase of infection. The lowest concentration of SARS-CoV-2 viral copies this assay can detect is 138 copies/mL. A negative result does not preclude SARS-Cov-2 infection and should not be used as the sole basis for treatment or other patient management decisions. A negative result may occur with  improper specimen collection/handling, submission of specimen other than nasopharyngeal swab, presence of viral mutation(s) within the areas targeted by this assay, and inadequate number of viral copies(<138 copies/mL). A negative result must be combined with clinical observations, patient history, and epidemiological information. The expected result is Negative.  Fact Sheet for Patients:  EntrepreneurPulse.com.au  Fact Sheet for Healthcare Providers:   IncredibleEmployment.be  This test is no t yet approved or cleared by the [place] FDA and  has been authorized for detection and/or diagnosis of SARS-CoV-2 by FDA under an Emergency Use Authorization (EUA). This EUA will remain  in effect (meaning this test can be used) for the duration of the COVID-19 declaration under Section 564(b)(1) of the Act, 21 U.S.C.section 360bbb-3(b)(1), unless the authorization is terminated  or revoked sooner.       Influenza A by PCR NEGATIVE NEGATIVE Final   Influenza B by PCR NEGATIVE NEGATIVE Final    Comment: (NOTE) The Xpert Xpress SARS-CoV-2/FLU/RSV plus assay is intended as an aid in the diagnosis of influenza from Nasopharyngeal swab specimens and should not be used as a sole basis for treatment. Nasal washings and aspirates are unacceptable for Xpert Xpress SARS-CoV-2/FLU/RSV testing.  Fact Sheet for Patients: EntrepreneurPulse.com.au  Fact Sheet for Healthcare Providers: IncredibleEmployment.be  This test is not yet approved or cleared by the [place] FDA and has been authorized for detection and/or diagnosis of SARS-CoV-2 by FDA under an Emergency Use Authorization (EUA). This EUA will remain in effect (meaning this test can be used) for the duration of the COVID-19 declaration under Section 564(b)(1) of the Act, 21 U.S.C. section 360bbb-3(b)(1), unless the authorization is terminated or revoked.  Performed at [facility], [place] [address] [place], [place] [address]   Culture, blood (routine x 2)     Status: None (Preliminary result)   Collection Time: 03/01/21  5:59 PM   Specimen: BLOOD  Result Value Ref Range Status   Specimen Description   Final    BLOOD SITE NOT SPECIFIED Performed at  [facility], [place] [address] [place], [place] [address]    Special Requests   Final    BOTTLES DRAWN AEROBIC AND ANAEROBIC Blood Culture adequate  volume Performed at [place] [address], [place], [place] [address]    Culture   Final    NO GROWTH 3 DAYS Performed at [facility], [place] [address] [place], [place] [address]    Report Status PENDING  Incomplete  Culture, blood (routine x 2)     Status: None (Preliminary result)   Collection Time: 03/01/21  6:03 PM   Specimen: BLOOD  Result Value Ref Range Status   Specimen Description BLOOD SITE NOT SPECIFIED  Final   Special Requests   Final    BOTTLES DRAWN AEROBIC AND ANAEROBIC Blood Culture adequate volume   Culture   Final    NO GROWTH 3 DAYS Performed at [facility], [address] [place], [place] [address]    Report Status PENDING  Incomplete     Medications:   . bisoprolol  10 mg Oral q morning  . gabapentin  600 mg Oral BID   Continuous Infusions: . DAPTOmycin (CUBICIN)  IV 500 mg (03/04/21 2121)  . dextrose 75 mL/hr at 03/04/21 1122      LOS: 2 days   [facility]  03/05/2021, 9:17 AM

## 2021-03-05 NOTE — Progress Notes (Signed)
[facility]  Date of Admission:  03/01/2021      CC: Severe Sepsis without septic shock Purulent cellulitis/soft tissue infection   Lines:  Peripheral iv's  Abx: 4/05-c daptomycin  4/03-4/05 cefazolin                                                        Assessment: 70 yo male smoker, ckd3, htn/hlp, osa/cpap admitted 4/03 for 2 days acute progressive pain/redness/swelling of the left elbow clinically consistent with purulent ssi  Mri reviewed no evidence of septic olecranon bursitis or septic arthritis, consistent with clinical exam  Blood culture negative  Patient not significantly improved; stable; impetigo changes and small skin maceration forearm today 4/07  Plan: 1. As significant soft tissue burden involvement, and clinically a staph aureus process, will switch to linezolid for its toxin inhibition as well as great tissue pk-pd/proven efficacy in this setting 2. Will monitor platelet while on linezolid 3. Will determine final duration pending clinical improvement  4. When ready for discharge, can f/u with [facility] Dr Megan Salon on 4/20 @ 51 am   @  [facility] [place], [place], [place] [address] Phone: [phone]    I spent more than 35 minute reviewing data/chart, and coordinating care and >50% direct face to face time providing counseling/discussing diagnostics/treatment plan with patient   -------------------  Principal Problem:   Cellulitis of left upper extremity Active Problems:   Essential hypertension   Hyperlipidemia, mixed   OSA and COPD overlap syndrome (HCC)   Acute kidney injury superimposed on CKD (HCC)   Thrombocytopenia (HCC)   Cellulitis of left arm   CKD (chronic kidney disease), stage III (HCC)   Septic olecranon bursitis of left elbow   Severe sepsis ([place])   No Known Allergies  Scheduled Meds: . bisoprolol  10 mg Oral q morning  . gabapentin  600 mg Oral BID  .  linezolid  600 mg Oral Q12H   Continuous Infusions:  PRN Meds:.acetaminophen **OR** acetaminophen, albuterol, ondansetron **OR** ondansetron (ZOFRAN) IV, traMADol, zolpidem   SUBJECTIVE: Feels well Left forearm not particularly painful Maintains ROM well left elbow Forearm rash/swelling stable No n/v/diarrhea No f/c  Review of Systems: ROS All other ROS was negative, except mentioned above     OBJECTIVE: Vitals:   03/04/21 2026 03/04/21 2128 03/05/21 0542 03/05/21 1345  BP: (!) 147/81  (!) 156/68 (!) 154/75  Pulse: 66  73 70  Resp: 16  18 18   Temp: 99.9 F (37.7 C)   99.4 F (37.4 C)  TempSrc: Oral   Oral  SpO2: (!) 87% 90% (!) 88% 91%  Weight:      Height:       Body mass index is 31.94 kg/m.  Physical Exam General/constitutional: no distress, pleasant HEENT: Normocephalic, PER, Conj Clear, EOMI, Oropharynx clear Neck supple CV: rrr no mrg Lungs: clear to auscultation, normal respiratory effort Abd: Soft, Nontender Ext: no edema Skin: No Rash Neuro: nonfocal MSK: left forearm stable swelling/erythema, slight purulence/pustule just distal to the olecronon bursa area; intact extension/flexion of the elbow; not particularly tender on palpation over the olecronon area. Today there is some impetigo crusting of the dorsal forearm but overall swelling/redness stable    Lab Results Lab Results  Component  Value Date   WBC 8.6 03/04/2021   HGB 14.3 03/04/2021   HCT 43.8 03/04/2021   MCV 100.0 03/04/2021   PLT 86 (L) 03/04/2021    Lab Results  Component Value Date   CREATININE 1.89 (H) 03/04/2021   BUN 23 03/04/2021   NA 141 03/04/2021   K 3.8 03/04/2021   CL 113 (H) 03/04/2021   CO2 19 (L) 03/04/2021    Lab Results  Component Value Date   ALT 22 12/03/2020   AST 24 12/03/2020   ALKPHOS 58 06/13/2017   BILITOT 0.5 12/03/2020      Microbiology: Recent Results (from the past 240 hour(s))  Resp Panel by RT-PCR (Flu A&B, Covid) Nasopharyngeal Swab      Status: None   Collection Time: 03/01/21  5:44 PM   Specimen: Nasopharyngeal Swab; Nasopharyngeal(NP) swabs in vial transport medium  Result Value Ref Range Status   SARS Coronavirus 2 by RT PCR NEGATIVE NEGATIVE Final    Comment: (NOTE) SARS-CoV-2 target nucleic acids are NOT DETECTED.  The SARS-CoV-2 RNA is generally detectable in upper respiratory specimens during the acute phase of infection. The lowest concentration of SARS-CoV-2 viral copies this assay can detect is 138 copies/mL. A negative result does not preclude SARS-Cov-2 infection and should not be used as the sole basis for treatment or other patient management decisions. A negative result may occur with  improper specimen collection/handling, submission of specimen other than nasopharyngeal swab, presence of viral mutation(s) within the areas targeted by this assay, and inadequate number of viral copies(<138 copies/mL). A negative result must be combined with clinical observations, patient history, and epidemiological information. The expected result is Negative.  Fact Sheet for Patients:  EntrepreneurPulse.com.au  Fact Sheet for Healthcare Providers:  IncredibleEmployment.be  This test is no t yet approved or cleared by the [place] FDA and  has been authorized for detection and/or diagnosis of SARS-CoV-2 by FDA under an Emergency Use Authorization (EUA). This EUA will remain  in effect (meaning this test can be used) for the duration of the COVID-19 declaration under Section 564(b)(1) of the Act, 21 U.S.C.section 360bbb-3(b)(1), unless the authorization is terminated  or revoked sooner.       Influenza A by PCR NEGATIVE NEGATIVE Final   Influenza B by PCR NEGATIVE NEGATIVE Final    Comment: (NOTE) The Xpert Xpress SARS-CoV-2/FLU/RSV plus assay is intended as an aid in the diagnosis of influenza from Nasopharyngeal swab specimens and should not be used as a sole basis  for treatment. Nasal washings and aspirates are unacceptable for Xpert Xpress SARS-CoV-2/FLU/RSV testing.  Fact Sheet for Patients: EntrepreneurPulse.com.au  Fact Sheet for Healthcare Providers: IncredibleEmployment.be  This test is not yet approved or cleared by the [place] FDA and has been authorized for detection and/or diagnosis of SARS-CoV-2 by FDA under an Emergency Use Authorization (EUA). This EUA will remain in effect (meaning this test can be used) for the duration of the COVID-19 declaration under Section 564(b)(1) of the Act, 21 U.S.C. section 360bbb-3(b)(1), unless the authorization is terminated or revoked.  Performed at [facility], [place] [address], [place], [place] [address]   Culture, blood (routine x 2)     Status: None (Preliminary result)   Collection Time: 03/01/21  5:59 PM   Specimen: BLOOD  Result Value Ref Range Status   Specimen Description   Final    BLOOD SITE NOT SPECIFIED Performed at [place] [address], [place], [place] [address]  Special Requests   Final    BOTTLES DRAWN AEROBIC AND ANAEROBIC Blood Culture adequate volume Performed at [place] [address], [place], [place] [address]    Culture   Final    NO GROWTH 4 DAYS Performed at [facility], [place] [address], [place], [place] [address]    Report Status PENDING  Incomplete  Culture, blood (routine x 2)     Status: None (Preliminary result)   Collection Time: 03/01/21  6:03 PM   Specimen: BLOOD  Result Value Ref Range Status   Specimen Description BLOOD SITE NOT SPECIFIED  Final   Special Requests   Final    BOTTLES DRAWN AEROBIC AND ANAEROBIC Blood Culture adequate volume   Culture   Final    NO GROWTH 4 DAYS Performed at [facility], [address], [place],  [address]    Report Status PENDING  Incomplete     Serology:   Imaging: If present, new  imagings (plain films, ct scans, and mri) have been personally visualized and interpreted; radiology reports have been reviewed. Decision making incorporated into the Impression / Recommendations.  4/5 mri left forearm Findings of extensive cellulitis with non loculated fluid within the subcutaneous soft tissues which could be phlegmon/early abscess. No definite evidence of osteomyelitis  Diffuse mild muscular edema/myositis most notable posteriorly.   Jabier Mutton, [place] [facility] [place] [phone] pager    03/05/2021, 3:21 PM

## 2021-03-05 NOTE — Progress Notes (Signed)
Physical Therapy Treatment Patient Details Name: Stephen Holland MRN: [id number] DOB: 1951/04/09 Today's Date: 03/05/2021    History of Present Illness 70 y.o. 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  Pt performed UE exercises and agreeable to perform these 2-3 times/day.  Pt anticipates ambulating today and encouraged him to ambulate in hallway (has only been up to bathroom) with staff and/or spouse.   (Pt not up to ambulating at time of session.)   Follow Up Recommendations  No PT follow up     Equipment Recommendations  None recommended by PT    Recommendations for Other Services       Precautions / Restrictions Precautions Precautions: Fall    Mobility  Bed Mobility                    Transfers                 General transfer comment: NT at this time, pt reports no difficulty  Ambulation/Gait             General Gait Details: pt reports ambulating to bathroom, encouraged pt to try hallway distance with staff or spouse   Stairs             Wheelchair Mobility    Modified Rankin (Stroke Patients Only)       Balance                                            Cognition Arousal/Alertness: Awake/alert Behavior During Therapy: WFL for tasks assessed/performed Overall Cognitive Status: Within Functional Limits for tasks assessed                                        Exercises General Exercises - Upper Extremity Shoulder Flexion: Left;10 reps;AROM Elbow Flexion: AROM;10 reps;Left Elbow Extension: AROM;Left;10 reps Wrist Flexion: AROM;Left;10 reps Wrist Extension: AROM;Left;10 reps Hand Exercises Forearm Supination:  AROM;Left;10 reps Forearm Pronation: AROM;10 reps;Left Wrist Flexion: AROM;10 reps;Left Wrist Extension: AROM;10 reps;Left Wrist Ulnar Deviation: AROM;5 reps;Left Digit Composite Flexion: AROM;5 reps;Left Composite Extension: AROM;5 reps;Left    General Comments        Pertinent Vitals/Pain Pain Assessment: 0-10 Pain Score: 6  Pain Location: Left UE Pain Descriptors / Indicators: Sore;Tightness Pain Intervention(s): Monitored during session;Repositioned    Home Living                      Prior Function            PT Goals (current goals can now be found in the care plan section) Progress towards PT goals: Progressing toward goals    Frequency    Min 2X/week      PT Plan      Co-evaluation              AM-PAC PT "6 Clicks" Mobility   Outcome Measure  Help needed turning from your back to your side while in a flat bed without using bedrails?: None Help  needed moving from lying on your back to sitting on the side of a flat bed without using bedrails?: None Help needed moving to and from a bed to a chair (including a wheelchair)?: None Help needed standing up from a chair using your arms (e.g., wheelchair or bedside chair)?: None Help needed to walk in hospital room?: A Little Help needed climbing 3-5 steps with a railing? : A Little 6 Click Score: 22    End of Session   Activity Tolerance: Patient tolerated treatment well Patient left: with call bell/phone within reach;with family/visitor present;in bed;with nursing/sitter in room (pt had Lt LE elevated above heart with pillows)         Time: 1747-1595 PT Time Calculation (min) (ACUTE ONLY): 10 min  Charges:  $Therapeutic Exercise: 8-22 mins           Arlyce Dice, DPT Acute Rehabilitation Services Pager: [phone] Office: [phone]             York Ram E 03/05/2021, 2:41 PM

## 2021-03-06 DIAGNOSIS — N1831 Chronic kidney disease, stage 3a: Secondary | ICD-10-CM | POA: Diagnosis not present

## 2021-03-06 DIAGNOSIS — I10 Essential (primary) hypertension: Secondary | ICD-10-CM | POA: Diagnosis not present

## 2021-03-06 DIAGNOSIS — G4733 Obstructive sleep apnea (adult) (pediatric): Secondary | ICD-10-CM

## 2021-03-06 DIAGNOSIS — N179 Acute kidney failure, unspecified: Secondary | ICD-10-CM | POA: Diagnosis not present

## 2021-03-06 DIAGNOSIS — J449 Chronic obstructive pulmonary disease, unspecified: Secondary | ICD-10-CM

## 2021-03-06 DIAGNOSIS — L03114 Cellulitis of left upper limb: Secondary | ICD-10-CM | POA: Diagnosis not present

## 2021-03-06 LAB — BASIC METABOLIC PANEL
Anion gap: 11 (ref 5–15)
BUN: 22 mg/dL (ref 8–23)
CO2: 24 mmol/L (ref 22–32)
Calcium: 9 mg/dL (ref 8.9–10.3)
Chloride: 103 mmol/L (ref 98–111)
Creatinine, Ser: 1.72 mg/dL — ABNORMAL HIGH (ref 0.61–1.24)
GFR, Estimated: 42 mL/min — ABNORMAL LOW (ref >60)
Glucose, Bld: 110 mg/dL — ABNORMAL HIGH (ref 70–99)
Potassium: 3.1 mmol/L — ABNORMAL LOW (ref 3.5–5.1)
Sodium: 138 mmol/L (ref 135–145)

## 2021-03-06 LAB — CBC
HCT: 41.5 % (ref 39.0–52.0)
Hemoglobin: 14 g/dL (ref 13.0–17.0)
MCH: 32.2 pg (ref 26.0–34.0)
MCHC: 33.7 g/dL (ref 30.0–36.0)
MCV: 95.4 fL (ref 80.0–100.0)
Platelets: 145 10*3/uL — ABNORMAL LOW (ref 150–400)
RBC: 4.35 MIL/uL (ref 4.22–5.81)
RDW: 14.1 % (ref 11.5–15.5)
WBC: 8.9 10*3/uL (ref 4.0–10.5)
nRBC: 0 % (ref 0.0–0.2)

## 2021-03-06 LAB — CULTURE, BLOOD (ROUTINE X 2)
Culture: NO GROWTH
Culture: NO GROWTH
Special Requests: ADEQUATE
Special Requests: ADEQUATE

## 2021-03-06 MED ORDER — LINEZOLID 600 MG PO TABS: 600.0000 mg | ORAL_TABLET | Freq: Two times a day (BID) | ORAL | 0 refills | Status: AC

## 2021-03-06 NOTE — Care Management Important Message (Signed)
Medicare IM printed for Social Work staff to give to the patient 

## 2021-03-06 NOTE — Discharge Summary (Addendum)
Physician Discharge Summary  Stephen Holland VWU:[id number] DOB: [birth year] DOA: 03/01/2021  PCP: [name], MD  Admit date: 03/01/2021 Discharge date: 03/06/2021  Admitted From: Home Disposition:  Home  Recommendations for Outpatient Follow-up:  1. Follow up with ID in 1-2 weeks 2. Please obtain BMP/CBC in one week   Home Health:No Equipment/Devices:None  Discharge Condition:Stable CODE STATUS:Full Diet recommendation: Heart Healthy  Brief/Interim Summary: Stephen Holland is an 70 y.o. 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 03/02/2021 chronic renal disease bilaterally no hydronephrosis. 03/02/2021 MRI of the left forearm ending is consistent with extensive cellulitis with nonloculated fluid within the subcutaneous soft tissue no evidence of osteo-.  Antibiotics: 03/01/2021 clindamycin 03/03/2021 03/01/2021 cefazolin 04/17/2021 03/04/2021 IV daptomycin  Microbiology data: Blood culture:  Procedures: None  Discharge Diagnoses:  Principal Problem:   Cellulitis of left upper extremity Active Problems:   Essential hypertension   Hyperlipidemia, mixed   OSA and COPD overlap syndrome (HCC)   AKI (acute kidney injury) ([facility])   Thrombocytopenia (HCC)   Cellulitis of left arm   CKD (chronic kidney disease), stage III (HCC)  Purulent left upper extremity cellulitis: Neurovascular integrity was intact. MRI MRI was done That showed nonloculated fluid collection no septic arthritis. Sepsis has benn rule out. He was started on IV empiric antibiotics. He defervesced. ID was consulted recommended to switch him to oral linezolid which  should continue as inpatient. Follow-up with them on 03/18/2021 8 9 AM in the morning.  Acute kidney injury on chronic kidney disease stage IIIa: In the setting of the pain IV. On admission his creatinine was 2.3 his baseline is 1.3-1.5 this resolved with IV fluid hydration.  Acute thrombocytopenia: Admitted with acute infection and resolved.  Essential hypertension: No changes made to his medication.  Hyperlipidemia: Continue statins.  Gout: Continue allopurinol.  Obstructive sleep apnea: Continue CPAP at night.   Discharge Instructions  Discharge Instructions    Diet - low sodium heart healthy   Complete by: As directed    Increase activity slowly   Complete by: As directed      Allergies as of 03/06/2021   No Known Allergies     Medication List    TAKE these medications   bisoprolol 10 MG tablet Commonly known as: ZEBETA Take  1 tablet  Daily  for BP What changed:   how much to take  how to take this  when to take this  additional instructions   clonazePAM 1 MG tablet Commonly known as: KLONOPIN Take  1/2 - 1 tablet  at Bedtime  ONLY  if needed forSleep &  limit to 5 days /week to avoid Addiction & Dementia What changed:   how much to take  how to take this  when to take this  reasons to take this  additional instructions   gabapentin 600 MG tablet Commonly known as: NEURONTIN TAKE 1/2 TO 1 TABLET 2 TO 3 TIMES DAILY AS NEEDED FOR NEUROPATHY PAIN What changed:   how much to take  how to take this  when to take this  additional instructions   Krill Oil 350 MG Caps Take 350 mg by mouth every morning.   linezolid 600 MG tablet Commonly known  as: ZYVOX Take 1 tablet (600 mg total) by mouth every 12 (twelve) hours for 14 days.   Magnesium 400 MG Caps Take 800 mg by mouth every morning.   meloxicam 15 MG tablet Commonly known as: MOBIC TAKE 1 TABLET BY MOUTH DAILY AS NEEDED WITH FOOD FOR PAIN & INFLAMMATION What changed:   how  much to take  how to take this  when to take this  additional instructions   naproxen sodium 220 MG tablet Commonly known as: ALEVE Take 440 mg by mouth daily as needed (pain).   PRESCRIPTION MEDICATION Inhale into the lungs at bedtime. CPAP   rosuvastatin 40 MG tablet Commonly known as: CRESTOR TAKE 1 TABLET BY MOUTH EVERY DAY FOR CHOLESTEROL What changed:   how much to take  how to take this  when to take this  additional instructions   telmisartan 80 MG tablet Commonly known as: MICARDIS TAKE 1 TABLET DAILY FOR BLOOD PRESSURE What changed:   how much to take  how to take this  when to take this  additional instructions   Vitamin D3 125 MCG (5000 UT) Caps Take 10,000 Units by mouth every morning.       No Known Allergies  Consultations:  Infectious disease   Procedures/Studies: DG Chest 2 View  Result Date: 03/01/2021 CLINICAL DATA:  Shortness of breath. EXAM: CHEST - 2 VIEW COMPARISON:  May 27, 2014 FINDINGS: Hyperinflation of the lungs with flattening of the diaphragms. The heart, hila, mediastinum, lungs, and pleura are otherwise unremarkable. IMPRESSION: Hyperinflation of the lungs with flattening of the diaphragms consistent with COPD or emphysema. No other abnormalities. Electronically Signed   By: Dorise Bullion III M.D   On: 03/01/2021 17:46   DG Elbow Complete Left  Result Date: 03/01/2021 CLINICAL DATA:  Cellulitis. EXAM: LEFT ELBOW - COMPLETE 3+ VIEW COMPARISON:  None. FINDINGS: No evidence of fracture of the ulna or humerus. The radial head is normal. No joint effusion. No soft tissue abnormality evident IMPRESSION: No acute osseous abnormality. Electronically Signed   By: Suzy Bouchard M.D.   On: 03/01/2021 16:29   US RENAL  Result Date: 03/03/2021 CLINICAL DATA:  Acute kidney injury. EXAM: RENAL / URINARY TRACT ULTRASOUND COMPLETE COMPARISON:  February 21, 2020. FINDINGS: Right Kidney: Renal measurements: 11.2 x 5.8 x 5.2 cm = volume:  178 mL. Increased echogenicity of renal parenchyma is noted suggesting medical renal disease. No mass or hydronephrosis visualized. Left Kidney: Renal measurements: 12.8 x 6.0 x 6.0 cm = volume: 242 mL. Two simple cysts are noted in the lower pole, with the largest measuring 1.5 cm. Increased echogenicity of renal parenchyma is noted suggesting medical renal disease. No mass or hydronephrosis visualized. Bladder: Appears normal for degree of bladder distention. Ureteral jets are not visualized. Other: Exam was somewhat limited as the patient had difficulty holding breath. IMPRESSION: Increased echogenicity of renal parenchyma is noted bilaterally suggesting medical renal disease. No hydronephrosis or renal obstruction is noted. Electronically Signed   By: Marijo Conception M.D.   On: 03/03/2021 11:52   MR FOREARM LEFT WO CONTRAST  Result Date: 03/03/2021 CLINICAL DATA:  Arm pain, fever EXAM: MRI OF THE LEFT FOREARM WITHOUT CONTRAST TECHNIQUE: Multiplanar, multisequence MR imaging of the left was performed. No intravenous contrast was administered. COMPARISON:  None. FINDINGS: Bones/Joint/Cartilage No fracture or dislocation. No areas cortical destruction periosteal reaction is seen. Small elbow joint effusion is present. Ligaments Suboptimally visualized Muscles and Tendons Mildly increased feathery signal seen within the  muscles of the forearm, most notable seen posteriorly. No loculated fluid collections however are seen. The flexor and extensor tendons intact. Soft tissues Extensive diffuse subcutaneous edema seen surrounding the forearm. There is non loculated fluid seen within the fascial layers extending into the deep fascial layers. This subcutaneous emphysema however is noted. IMPRESSION: Findings of extensive cellulitis with non loculated fluid within the subcutaneous soft tissues which could be phlegmon/early abscess. No definite evidence of osteomyelitis Diffuse mild muscular edema/myositis most notable  posteriorly. Electronically Signed   By: Prudencio Pair M.D.   On: 03/03/2021 23:45   VAS [place] UPPER EXTREMITY VENOUS DUPLEX  Result Date: 03/04/2021 UPPER VENOUS STUDY  Indications: Edema Comparison Study: no prior Performing Technologist: Abram Sander RVS  Examination Guidelines: A complete evaluation includes B-mode imaging, spectral Doppler, color Doppler, and power Doppler as needed of all accessible portions of each vessel. Bilateral testing is considered an integral part of a complete examination. Limited examinations for reoccurring indications may be performed as noted.  Left Findings: +----------+------------+---------+-----------+----------+-------+ LEFT      CompressiblePhasicitySpontaneousPropertiesSummary +----------+------------+---------+-----------+----------+-------+ IJV           Full       Yes       Yes                      +----------+------------+---------+-----------+----------+-------+ Subclavian    Full       Yes       Yes                      +----------+------------+---------+-----------+----------+-------+ Axillary      Full       Yes       Yes                      +----------+------------+---------+-----------+----------+-------+ Brachial      Full       Yes       Yes                      +----------+------------+---------+-----------+----------+-------+ Radial        Full                                          +----------+------------+---------+-----------+----------+-------+ Ulnar         Full                                          +----------+------------+---------+-----------+----------+-------+ Cephalic      Full                                          +----------+------------+---------+-----------+----------+-------+ Basilic       Full                                          +----------+------------+---------+-----------+----------+-------+  Summary:  Left: No evidence of deep vein thrombosis in the upper  extremity. No evidence of superficial vein thrombosis in the upper extremity. No evidence of thrombosis in the subclavian.  *See table(s) above for measurements and  observations.  Diagnosing physician: Curt Jews MD Electronically signed by Curt Jews MD on 03/04/2021 at 6:02:40 AM.    Final       Subjective: No complaints feels great  Discharge Exam: Vitals:   03/05/21 2232 03/06/21 0523  BP:  (!) 147/66  Pulse: 66 (!) 58  Resp: 20 16  Temp:    SpO2: 93% 90%   Vitals:   03/05/21 1345 03/05/21 2058 03/05/21 2232 03/06/21 0523  BP: (!) 154/75 (!) 172/68  (!) 147/66  Pulse: 70 71 66 (!) 58  Resp: 18 16 20 16   Temp: 99.4 F (37.4 C) 99.1 F (37.3 C)    TempSrc: Oral Oral    SpO2: 91% (!) 87% 93% 90%  Weight:      Height:        General: Pt is alert, awake, not in acute distress Cardiovascular: RRR, S1/S2 +, no rubs, no gallops Respiratory: CTA bilaterally, no wheezing, no rhonchi Abdominal: Soft, NT, ND, bowel sounds + Extremities: no edema, no cyanosis    The results of significant diagnostics from this hospitalization (including imaging, microbiology, ancillary and laboratory) are listed below for reference.     Microbiology: Recent Results (from the past 240 hour(s))  Resp Panel by RT-PCR (Flu A&B, Covid) Nasopharyngeal Swab     Status: None   Collection Time: 03/01/21  5:44 PM   Specimen: Nasopharyngeal Swab; Nasopharyngeal(NP) swabs in vial transport medium  Result Value Ref Range Status   SARS Coronavirus 2 by RT PCR NEGATIVE NEGATIVE Final    Comment: (NOTE) SARS-CoV-2 target nucleic acids are NOT DETECTED.  The SARS-CoV-2 RNA is generally detectable in upper respiratory specimens during the acute phase of infection. The lowest concentration of SARS-CoV-2 viral copies this assay can detect is 138 copies/mL. A negative result does not preclude SARS-Cov-2 infection and should not be used as the sole basis for treatment or other patient management decisions. A  negative result may occur with  improper specimen collection/handling, submission of specimen other than nasopharyngeal swab, presence of viral mutation(s) within the areas targeted by this assay, and inadequate number of viral copies(<138 copies/mL). A negative result must be combined with clinical observations, patient history, and epidemiological information. The expected result is Negative.  Fact Sheet for Patients:  EntrepreneurPulse.com.au  Fact Sheet for Healthcare Providers:  IncredibleEmployment.be  This test is no t yet approved or cleared by the [place] FDA and  has been authorized for detection and/or diagnosis of SARS-CoV-2 by FDA under an Emergency Use Authorization (EUA). This EUA will remain  in effect (meaning this test can be used) for the duration of the COVID-19 declaration under Section 564(b)(1) of the Act, 21 U.S.C.section 360bbb-3(b)(1), unless the authorization is terminated  or revoked sooner.       Influenza A by PCR NEGATIVE NEGATIVE Final   Influenza B by PCR NEGATIVE NEGATIVE Final    Comment: (NOTE) The Xpert Xpress SARS-CoV-2/FLU/RSV plus assay is intended as an aid in the diagnosis of influenza from Nasopharyngeal swab specimens and should not be used as a sole basis for treatment. Nasal washings and aspirates are unacceptable for Xpert Xpress SARS-CoV-2/FLU/RSV testing.  Fact Sheet for Patients: EntrepreneurPulse.com.au  Fact Sheet for Healthcare Providers: IncredibleEmployment.be  This test is not yet approved or cleared by the [place] FDA and has been authorized for detection and/or diagnosis of SARS-CoV-2 by FDA under an Emergency Use Authorization (EUA). This EUA will remain in effect (meaning this test can be used) for the duration  of the COVID-19 declaration under Section 564(b)(1) of the Act, 21 U.S.C. section 360bbb-3(b)(1), unless the authorization  is terminated or revoked.  Performed at [facility], [place] [address], [place], [place] [address]   Culture, blood (routine x 2)     Status: None   Collection Time: 03/01/21  5:59 PM   Specimen: BLOOD  Result Value Ref Range Status   Specimen Description   Final    BLOOD SITE NOT SPECIFIED Performed at [facility] [address] [place], [place] [address]    Special Requests   Final    BOTTLES DRAWN AEROBIC AND ANAEROBIC Blood Culture adequate volume Performed at [facility] [address] [place], [place] [address]    Culture   Final    NO GROWTH 5 DAYS Performed at [facility], [place] [address], [place], [place] [address]    Report Status 03/06/2021 FINAL  Final  Culture, blood (routine x 2)     Status: None   Collection Time: 03/01/21  6:03 PM   Specimen: BLOOD  Result Value Ref Range Status   Specimen Description BLOOD SITE NOT SPECIFIED  Final   Special Requests   Final    BOTTLES DRAWN AEROBIC AND ANAEROBIC Blood Culture adequate volume   Culture   Final    NO GROWTH 5 DAYS Performed at [facility], [place] [address], [place], [place] [address]    Report Status 03/06/2021 FINAL  Final     Labs: BNP (last 3 results) No results for input(s): BNP in the last 8760 hours. Basic Metabolic Panel: Recent Labs  Lab 03/01/21 1534 03/02/21 0440 03/03/21 0510 03/04/21 0424 03/06/21 0448  NA 138 139 142 141 138  K 4.6 4.2 4.0 3.8 3.1*  CL 107 107 111 113* 103  CO2 25 23 23  19* 24  GLUCOSE 115* 113* 109* 92 110*  BUN 31* 32* 24* 23 22  CREATININE 2.31* 1.71* 1.91* 1.89* 1.72*  CALCIUM 9.2 8.8* 8.6* 8.3* 9.0   Liver Function Tests: No results for input(s): AST, ALT, ALKPHOS, BILITOT, PROT, ALBUMIN in the last 168 hours. No results for input(s): LIPASE, AMYLASE in the last 168 hours. No results for input(s): AMMONIA in the last 168 hours. CBC: Recent Labs  Lab 03/01/21 1534 03/02/21 0440 03/03/21 0510  03/04/21 0424 03/06/21 0448  WBC 12.4* 10.5 9.6 8.6 8.9  NEUTROABS 10.0*  --   --   --   --   HGB 16.9 15.7 14.2 14.3 14.0  HCT 51.4 47.6 44.4 43.8 41.5  MCV 100.2* 99.6 100.9* 100.0 95.4  PLT 77* 73* 81* 86* 145*   Cardiac Enzymes: Recent Labs  Lab 03/04/21 0424  CKTOTAL 88   BNP: Invalid input(s): POCBNP CBG: No results for input(s): GLUCAP in the last 168 hours. D-Dimer No results for input(s): DDIMER in the last 72 hours. Hgb A1c Recent Labs    03/04/21 0424  HGBA1C 6.1*   Lipid Profile No results for input(s): CHOL, HDL, LDLCALC, TRIG, CHOLHDL, LDLDIRECT in the last 72 hours. Thyroid function studies No results for input(s): TSH, T4TOTAL, T3FREE, THYROIDAB in the last 72 hours.  Invalid input(s): FREET3 Anemia work up No results for input(s): VITAMINB12, FOLATE, FERRITIN, TIBC, IRON, RETICCTPCT in the last 72 hours. Urinalysis    Component Value Date/Time   COLORURINE YELLOW 03/03/2021 0943   APPEARANCEUR HAZY (A) 03/03/2021 0943   LABSPEC 1.013 03/03/2021 0943   PHURINE 6.0 03/03/2021 1950  GLUCOSEU NEGATIVE 03/03/2021 0943   HGBUR MODERATE (A) 03/03/2021 [place] 03/03/2021 [place] 03/03/2021 0943   PROTEINUR 30 (A) 03/03/2021 0943   NITRITE NEGATIVE 03/03/2021 0943   LEUKOCYTESUR NEGATIVE 03/03/2021 0943   Sepsis Labs Invalid input(s): PROCALCITONIN,  WBC,  LACTICIDVEN Microbiology Recent Results (from the past 240 hour(s))  Resp Panel by RT-PCR (Flu A&B, Covid) Nasopharyngeal Swab     Status: None   Collection Time: 03/01/21  5:44 PM   Specimen: Nasopharyngeal Swab; Nasopharyngeal(NP) swabs in vial transport medium  Result Value Ref Range Status   SARS Coronavirus 2 by RT PCR NEGATIVE NEGATIVE Final    Comment: (NOTE) SARS-CoV-2 target nucleic acids are NOT DETECTED.  The SARS-CoV-2 RNA is generally detectable in upper respiratory specimens during the acute phase of infection. The lowest concentration of  SARS-CoV-2 viral copies this assay can detect is 138 copies/mL. A negative result does not preclude SARS-Cov-2 infection and should not be used as the sole basis for treatment or other patient management decisions. A negative result may occur with  improper specimen collection/handling, submission of specimen other than nasopharyngeal swab, presence of viral mutation(s) within the areas targeted by this assay, and inadequate number of viral copies(<138 copies/mL). A negative result must be combined with clinical observations, patient history, and epidemiological information. The expected result is Negative.  Fact Sheet for Patients:  EntrepreneurPulse.com.au  Fact Sheet for Healthcare Providers:  IncredibleEmployment.be  This test is no t yet approved or cleared by the [place] FDA and  has been authorized for detection and/or diagnosis of SARS-CoV-2 by FDA under an Emergency Use Authorization (EUA). This EUA will remain  in effect (meaning this test can be used) for the duration of the COVID-19 declaration under Section 564(b)(1) of the Act, 21 U.S.C.section 360bbb-3(b)(1), unless the authorization is terminated  or revoked sooner.       Influenza A by PCR NEGATIVE NEGATIVE Final   Influenza B by PCR NEGATIVE NEGATIVE Final    Comment: (NOTE) The Xpert Xpress SARS-CoV-2/FLU/RSV plus assay is intended as an aid in the diagnosis of influenza from Nasopharyngeal swab specimens and should not be used as a sole basis for treatment. Nasal washings and aspirates are unacceptable for Xpert Xpress SARS-CoV-2/FLU/RSV testing.  Fact Sheet for Patients: EntrepreneurPulse.com.au  Fact Sheet for Healthcare Providers: IncredibleEmployment.be  This test is not yet approved or cleared by the [place] FDA and has been authorized for detection and/or diagnosis of SARS-CoV-2 by FDA under an Emergency Use  Authorization (EUA). This EUA will remain in effect (meaning this test can be used) for the duration of the COVID-19 declaration under Section 564(b)(1) of the Act, 21 U.S.C. section 360bbb-3(b)(1), unless the authorization is terminated or revoked.  Performed at [facility], [place] [address], [place], [place] [address]   Culture, blood (routine x 2)     Status: None   Collection Time: 03/01/21  5:59 PM   Specimen: BLOOD  Result Value Ref Range Status   Specimen Description   Final    BLOOD SITE NOT SPECIFIED Performed at [facility] [address], [place], [place] [address]    Special Requests   Final    BOTTLES DRAWN AEROBIC AND ANAEROBIC Blood Culture adequate volume Performed at [facility] [address], [place], [place] [address]    Culture   Final    NO GROWTH 5 DAYS Performed at [facility], [place]  [address], [place], [place] [address]    Report Status 03/06/2021 FINAL  Final  Culture, blood (routine x 2)     Status: None   Collection Time: 03/01/21  6:03 PM   Specimen: BLOOD  Result Value Ref Range Status   Specimen Description BLOOD SITE NOT SPECIFIED  Final   Special Requests   Final    BOTTLES DRAWN AEROBIC AND ANAEROBIC Blood Culture adequate volume   Culture   Final    NO GROWTH 5 DAYS Performed at [facility], [place] [address], [place],  [address]    Report Status 03/06/2021 FINAL  Final     Time coordinating discharge: Over 30 minutes  SIGNED:   Charlynne Cousins, MD  [facility] 03/06/2021, 9:51 AM Pager   If 7PM-7AM, please contact night-coverage [email or web address] Password [email or web address]

## 2021-03-09 ENCOUNTER — Telehealth: Payer: Self-pay | Admitting: *Deleted

## 2021-03-09 NOTE — Telephone Encounter (Signed)
I left a message for the patient to return my call.

## 2021-03-09 NOTE — Telephone Encounter (Signed)
Called patient on 03/09/2021 , 11:01 AM in an attempt to reach the patient for a hospital follow up. Spoke with the patient.  Admit date: 03/01/21 Discharge: 03/06/21   He does not have any questions or concerns about medications from the hospital admission. The patient's medications were reviewed over the phone, they were counseled to bring in all current medications to the hospital follow up visit.   I advised the patient to call if any questions or concerns arise about the hospital admission or medications    Home health was not started in the hospital.  All questions were answered and a follow up appointment was made. Appointment with Dr Melford Aase on 03/10/2021.  Prior to Admission medications   Medication Sig Start Date End Date Taking? Authorizing Provider  bisoprolol (ZEBETA) 10 MG tablet Take  1 tablet  Daily  for BP Patient taking differently: Take 10 mg by mouth every morning. for BP 02/06/21   [name], MD  Cholecalciferol (VITAMIN D3) 125 MCG (5000 UT) CAPS Take 10,000 Units by mouth every morning.    [provider]  clonazePAM (KLONOPIN) 1 MG tablet Take  1/2 - 1 tablet  at Bedtime  ONLY  if needed forSleep &  limit to 5 days /week to avoid Addiction & Dementia Patient taking differently: Take 1 mg by mouth at bedtime as needed (restless legs). limit to 5 days /week to avoid Addiction & Dementia 02/23/21   [name], MD  gabapentin (NEURONTIN) 600 MG tablet TAKE 1/2 TO 1 TABLET 2 TO 3 TIMES DAILY AS NEEDED FOR NEUROPATHY PAIN Patient taking differently: Take 600 mg by mouth 2 (two) times daily. FOR NEUROPATHY PAIN 02/23/21   [name], MD  Javier Docker Oil 350 MG CAPS Take 350 mg by mouth every morning.    [provider]  linezolid (ZYVOX) 600 MG tablet Take 1 tablet (600 mg total) by mouth every 12 (twelve) hours for 14 days. 03/06/21 03/20/21  Charlynne Cousins, MD  Magnesium 400 MG CAPS Take 800 mg by mouth every morning.    [provider]   meloxicam (MOBIC) 15 MG tablet TAKE 1 TABLET BY MOUTH DAILY AS NEEDED WITH FOOD FOR PAIN & INFLAMMATION Patient taking differently: Take 15 mg by mouth every morning. FOR PAIN & INFLAMMATION 01/15/21   Liane Comber, NP  naproxen sodium (ALEVE) 220 MG tablet Take 440 mg by mouth daily as needed (pain).    [provider]  PRESCRIPTION MEDICATION Inhale into the lungs at bedtime. CPAP    [provider]  rosuvastatin (CRESTOR) 40 MG tablet TAKE 1 TABLET BY MOUTH EVERY DAY FOR CHOLESTEROL Patient taking differently: Take 40 mg by mouth every morning. FOR CHOLESTEROL 12/10/20   Liane Comber, NP  telmisartan (MICARDIS) 80 MG tablet TAKE 1 TABLET DAILY FOR BLOOD PRESSURE Patient taking differently: Take 80 mg by mouth every morning. FOR BLOOD PRESSURE 01/15/21   Liane Comber, NP

## 2021-03-09 NOTE — Progress Notes (Signed)
Future Appointments  Date Time Provider [place]  03/10/2021 10:30 AM [name], MD [facility] None  03/18/2021  8:45 AM Michel Bickers, MD [facility]  03/25/2021 10:30 AM Liane Comber, NP [facility] None  06/04/2021 10:30 AM Ward Givens, NP [facility] None  06/25/2021 10:30 AM [name], MD [facility] None  09/08/2021 11:00 AM Garnet Sierras, NP [facility] None  12/21/2021  2:00 PM [name], MD [facility] None    [place]     This very nice 70 y.o. MWM was admitted to the hospital on  03/01/2021  and patient was discharged from the hospital on 03/06/2021. The patient now presents 4 days post discharge for follow up for transition from recent hospitalization .  The day after discharge  our clinical staff contacted the patient to assure stability and schedule a follow up appointment. The discharge summary, medications and diagnostic test results were reviewed before meeting with the patient. The patient was admitted for:   Cellulitis of left upper extremity AKI (acute kidney injury) ([place]) Essential hypertension OSA and COPD overlap syndrome (HCC) Thrombocytopenia (HCC) Chronic renal failure, stage 3b (HCC) Septic olecranon bursitis of left elbow Severe sepsis [facility])      Patient was admitted thru the ER with Sepsis attributed to a septic Lt Olecranon Bursitis & cellulitis of the LUE. Admission labs showed AKI which recovered with IVF rehydration and he also had an initial thrombocytopenia which resolved during hospitalization. Patient was treated initially with empiric IV Abx's and ID consultant recommended switching to oral Zyvox (linezolid) 600 mg bid/q12h for 14 days.       Hospitalization discharge instructions and medications are reconciled with the patient.      Patient is also followed with Hypertension, Hyperlipidemia, Pre-Diabetes and Vitamin D Deficiency.      Patient is treated for HTN & BP has been controlled at home.  Today's BP is at goal - 132/78. Patient has had no complaints of any cardiac type chest pain, palpitations, dyspnea/orthopnea/PND, dizziness, claudication, or dependent edema.     Hyperlipidemia is controlled with diet & meds. Patient denies myalgias or other med SE's. Last Lipids were at goal except slightly elevated Trig's:  Lab Results  Component Value Date   CHOL 149 12/03/2020   HDL 38 (L) 12/03/2020   LDLCALC 81 12/03/2020   TRIG 208 (H) 12/03/2020   CHOLHDL 3.9 12/03/2020      Also, the patient has history of PreDiabetes and has had no symptoms of reactive hypoglycemia, diabetic polys, paresthesias or visual blurring.  Last A1c was not at goal:  Lab Results  Component Value Date   HGBA1C 6.1 (H) 03/04/2021      Further, the patient also has history of Vitamin D Deficiency and supplements vitamin D without any suspected side-effects. Last vitamin D was t goal:  Lab Results  Component Value Date   VD25OH 76 12/03/2020   Current Outpatient Medications on File Prior to Visit  Medication Sig  . bisoprolol  10 MG tablet Take  1 tablet  Daily  =  . VITAMIN D 10,000 Units Takevery morning.  . clonazePAM = 1 MG tablet Take  1/2 - 1 tablet  at Bedtime  ONLY  if needed   . Gabapentin 600 MG tablet TAKE 1/2 TO 1 TAB  2  TIMES DAILY AS NEEDED   . Krill Oil 350 MG CAPS Take  every morning.  Marland Kitchen linezolid (ZYVOX) 600 MG tablet Take 1 tablet  every 12  hours for 14 days.  Marland Kitchen  Magnesium 400 MG CAPS Take 800 mg  every morning.  . meloxicam 15 MG tablet TAKE 1 TABLET DAILY   . rosuvastatin 40 MG tablet TAKE 1 TABLET EVERY DAY   . telmisartan 80 MG tablet TAKE 1 TABLET DAILY    No Known Allergies    PMHx:   Past Medical History:  Diagnosis Date  . Colon polyps   . DJD (degenerative joint disease)   . ED (erectile dysfunction)   . Gout   . Hyperlipemia   . Hypertension   . Obstructive sleep apnea    08/2012, cpap setting of 14  . Prostate cancer ([place]) 01/2002   s/p prostatectomy   . Vitamin D deficiency    Immunization History  Administered Date(s) Administered  . Influenza Split 08/26/2014  . Influenza, High Dose Seasonal PF 08/12/2016, 10/17/2018, 09/05/2019  . Influenza, Seasonal, Injecte, Preservative Fre 10/27/2015  . PFIZER(Purple Top)SARS-COV-2 Vaccination 01/05/2020, 01/26/2020, 09/27/2020  . PPD Test 05/23/2014, 07/23/2015  . Pneumococcal Conjugate-13 08/26/2014  . Pneumococcal Polysaccharide-23 05/05/2010, 01/30/2016  . Td 11/30/2003  . Tdap 08/12/2010, 08/21/2020, 03/01/2021  . Zoster 04/29/2013   Past Surgical History:  Procedure Laterality Date  . COLONOSCOPY WITH PROPOFOL N/A 09/06/2016   Procedure: COLONOSCOPY WITH PROPOFOL;  Surgeon: Garlan Fair, MD;  Location: [facility] ENDOSCOPY;  Service: Endoscopy;  Laterality: N/A;  . colonscopy     x 3 with polyp one time  . PROSTATECTOMY  2003  . SKIN CANCER EXCISION     FHx:    Reviewed / unchanged  SHx:    Reviewed / unchanged  Systems Review:  Constitutional: Denies fever, chills, wt changes, headaches, insomnia, fatigue, night sweats, change in appetite. Eyes: Denies redness, blurred vision, diplopia, discharge, itchy, watery eyes.  ENT: Denies discharge, congestion, post nasal drip, epistaxis, sore throat, earache, hearing loss, dental pain, tinnitus, vertigo, sinus pain, snoring.  CV: Denies chest pain, palpitations, irregular heartbeat, syncope, dyspnea, diaphoresis, orthopnea, PND, claudication or edema. Respiratory: denies cough, dyspnea, DOE, pleurisy, hoarseness, laryngitis, wheezing.  Gastrointestinal: Denies dysphagia, odynophagia, heartburn, reflux, water brash, abdominal pain or cramps, nausea, vomiting, bloating, diarrhea, constipation, hematemesis, melena, hematochezia  or hemorrhoids. Genitourinary: Denies dysuria, frequency, urgency, nocturia, hesitancy, discharge, hematuria or flank pain. Musculoskeletal: Denies arthralgias, myalgias, stiffness, jt. swelling, pain, limping or  strain/sprain.  Skin: Denies pruritus, rash, hives, warts, acne, eczema or change in skin lesion(s). Neuro: No weakness, tremor, incoordination, spasms, paresthesia or pain. Psychiatric: Denies confusion, memory loss or sensory loss. Endo: Denies change in weight, skin or hair change.  Heme/Lymph: No excessive bleeding, bruising or enlarged lymph nodes.  Physical Exam  BP 132/78   Pulse (!) 53   Temp (!) 97.5 F (36.4 C) (Temporal)   Resp 16   Ht 5' 6.5" (1.689 m)   Wt 204 lb 9.6 oz (92.8 kg)   SpO2 99%   BMI 32.53 kg/m   Appears well nourished, well groomed  and in no distress.  Eyes: PERRLA, EOMs, conjunctiva no swelling or erythema. Sinuses: No frontal/maxillary tenderness ENT/Mouth: EAC's clear, TM's nl w/o erythema, bulging. Nares clear w/o erythema, swelling, exudates. Oropharynx clear without erythema or exudates. Oral hygiene is good. Tongue normal, non obstructing. Hearing intact.  Neck: Supple. Thyroid nl. Car 2+/2+ without bruits, nodes or JVD. Chest: Respirations nl with BS clear & equal w/o rales, rhonchi, wheezing or stridor.  Cor: Heart sounds normal w/ regular rate and rhythm without sig. murmurs, gallops, clicks or rubs. Peripheral pulses normal and equal  without edema.  Abdomen:  Soft & bowel sounds normal. Non-tender w/o guarding, rebound, hernias, masses or organomegaly.  Lymphatics: Unremarkable.  Musculoskeletal: Full ROM all peripheral extremities, joint stability, 5/5 strength and normal gait. Lt elbow olecranon is non-tender.  Skin: Warm, dry without exposed rashes, lesions or ecchymosis apparent. Skin of LUE appears pigmented with scaly  dry flaking/peeling with stasis type pigmrntation. No calor or lymphangitic streakling.  Neuro: Cranial nerves intact, reflexes equal bilaterally. Sensory-motor testing grossly intact. Tendon reflexes grossly intact.  Pysch: Alert & oriented x 3.  Insight and judgement nl & appropriate. No ideations.  Assessment and  Plan:  1. Cellulitis of left upper extremity  - CBC with Differential/Platelet  2. AKI (acute kidney injury) ([place])  - COMPLETE METABOLIC PANEL WITH GFR - Magnesium  3. Essential hypertension  - Continue medication, monitor blood pressure at home.  - Continue DASH diet.  Reminder to go to the ER if any CP,  SOB, nausea, dizziness, severe HA, changes vision/speech.  - COMPLETE METABOLIC PANEL WITH GFR - Magnesium  4. OSA and COPD overlap syndrome ([place])   5. Thrombocytopenia (HCC)  - CBC with Differential/Platelet  6. Chronic renal failure, stage 3b (HCC)  - COMPLETE METABOLIC PANEL WITH GFR  7. Septic olecranon bursitis of left elbow  - CBC with Differential/Platelet  8. Severe sepsis (HCC)  - CBC with Differential/Platelet  9. Idiopathic gout   - Uric acid  10. Hyperlipidemia, mixed   11. Abnormal glucose  - COMPLETE METABOLIC PANEL WITH GFR  12. Vitamin D deficiency   13. Medication management  - CBC with Differential/Platelet - COMPLETE METABOLIC PANEL WITH GFR - Magnesium - Uric acid        Discussed  regular exercise, BP monitoring, weight control to achieve/maintain BMI less than 25 and discussed meds and SE's. Recommended labs to assess and monitor clinical status with further disposition pending results of labs. Over 30 minutes of exam, counseling, chart review was performed.   Kirtland Bouchard, MD

## 2021-03-09 NOTE — Progress Notes (Incomplete)
[place]     This very nice 70 y.o. MWM was admitted to the hospital on  03/01/2021  and patient was discharged from the hospital on 03/06/2021. The patient now presents 4 days post discharge for follow up for transition from recent hospitalization .  The day after discharge  our clinical staff contacted the patient to assure stability and schedule a follow up appointment. The discharge summary, medications and diagnostic test results were reviewed before meeting with the patient. The patient was admitted for:      Hospitalization discharge instructions and medications are reconciled with the patient.      Patient is also followed with Hypertension, Hyperlipidemia, Pre-Diabetes and Vitamin D Deficiency.      Patient is treated for HTN & BP has been controlled at home. Today's  . Patient has had no complaints of any cardiac type chest pain, palpitations, dyspnea/orthopnea/PND, dizziness, claudication, or dependent edema.     Hyperlipidemia is controlled with diet & meds. Patient denies myalgias or other med SE's. Last Lipids were  Lab Results  Component Value Date   CHOL 149 12/03/2020   HDL 38 (L) 12/03/2020   LDLCALC 81 12/03/2020   TRIG 208 (H) 12/03/2020   CHOLHDL 3.9 12/03/2020      Also, the patient has history of T2_NIDDM PreDiabetes and has had no symptoms of reactive hypoglycemia, diabetic polys, paresthesias or visual blurring.  Last A1c was  Lab Results  Component Value Date   HGBA1C 6.1 (H) 03/04/2021      Further, the patient also has history of Vitamin D Deficiency and supplements vitamin D without any suspected side-effects. Last vitamin D was   Lab Results  Component Value Date   VD25OH 76 12/03/2020   Current Outpatient Medications on File Prior to Visit  Medication Sig  . bisoprolol (ZEBETA) 10 MG tablet Take  1 tablet  Daily  for BP (Patient taking differently: Take 10 mg by mouth every morning. for BP)  . Cholecalciferol (VITAMIN D3) 125 MCG  (5000 UT) CAPS Take 10,000 Units by mouth every morning.  . clonazePAM (KLONOPIN) 1 MG tablet Take  1/2 - 1 tablet  at Bedtime  ONLY  if needed forSleep &  limit to 5 days /week to avoid Addiction & Dementia (Patient taking differently: Take 1 mg by mouth at bedtime as needed (restless legs). limit to 5 days /week to avoid Addiction & Dementia)  . gabapentin (NEURONTIN) 600 MG tablet TAKE 1/2 TO 1 TABLET 2 TO 3 TIMES DAILY AS NEEDED FOR NEUROPATHY PAIN (Patient taking differently: Take 600 mg by mouth 2 (two) times daily. FOR NEUROPATHY PAIN)  . Krill Oil 350 MG CAPS Take 350 mg by mouth every morning.  Marland Kitchen linezolid (ZYVOX) 600 MG tablet Take 1 tablet (600 mg total) by mouth every 12 (twelve) hours for 14 days.  . Magnesium 400 MG CAPS Take 800 mg by mouth every morning.  . meloxicam (MOBIC) 15 MG tablet TAKE 1 TABLET BY MOUTH DAILY AS NEEDED WITH FOOD FOR PAIN & INFLAMMATION (Patient taking differently: Take 15 mg by mouth every morning. FOR PAIN & INFLAMMATION)  . naproxen sodium (ALEVE) 220 MG tablet Take 440 mg by mouth daily as needed (pain).  Marland Kitchen PRESCRIPTION MEDICATION Inhale into the lungs at bedtime. CPAP  . rosuvastatin (CRESTOR) 40 MG tablet TAKE 1 TABLET BY MOUTH EVERY DAY FOR CHOLESTEROL (Patient taking differently: Take 40 mg by mouth every morning. FOR CHOLESTEROL)  . telmisartan (MICARDIS) 80 MG tablet  TAKE 1 TABLET DAILY FOR BLOOD PRESSURE (Patient taking differently: Take 80 mg by mouth every morning. FOR BLOOD PRESSURE)   No current facility-administered medications on file prior to visit.   No Known Allergies PMHx:   Past Medical History:  Diagnosis Date  . Colon polyps   . DJD (degenerative joint disease)   . ED (erectile dysfunction)   . Gout   . Hyperlipemia   . Hypertension   . Obstructive sleep apnea    08/2012, cpap setting of 14  . Prostate cancer ([place]) 01/2002   s/p prostatectomy  . Vitamin D deficiency    Immunization History  Administered Date(s) Administered   . Influenza Split 08/26/2014  . Influenza, High Dose Seasonal PF 08/12/2016, 10/17/2018, 09/05/2019  . Influenza, Seasonal, Injecte, Preservative Fre 10/27/2015  . PFIZER(Purple Top)SARS-COV-2 Vaccination 01/05/2020, 01/26/2020, 09/27/2020  . PPD Test 05/23/2014, 07/23/2015  . Pneumococcal Conjugate-13 08/26/2014  . Pneumococcal Polysaccharide-23 05/05/2010, 01/30/2016  . Td 11/30/2003  . Tdap 08/12/2010, 08/21/2020, 03/01/2021  . Zoster 04/29/2013   Past Surgical History:  Procedure Laterality Date  . COLONOSCOPY WITH PROPOFOL N/A 09/06/2016   Procedure: COLONOSCOPY WITH PROPOFOL;  Surgeon: Garlan Fair, MD;  Location: [facility];  Service: Endoscopy;  Laterality: N/A;  . colonscopy     x 3 with polyp one time  . PROSTATECTOMY  2003  . SKIN CANCER EXCISION     FHx:    Reviewed / unchanged  SHx:    Reviewed / unchanged  Systems Review:  Constitutional: Denies fever, chills, wt changes, headaches, insomnia, fatigue, night sweats, change in appetite. Eyes: Denies redness, blurred vision, diplopia, discharge, itchy, watery eyes.  ENT: Denies discharge, congestion, post nasal drip, epistaxis, sore throat, earache, hearing loss, dental pain, tinnitus, vertigo, sinus pain, snoring.  CV: Denies chest pain, palpitations, irregular heartbeat, syncope, dyspnea, diaphoresis, orthopnea, PND, claudication or edema. Respiratory: denies cough, dyspnea, DOE, pleurisy, hoarseness, laryngitis, wheezing.  Gastrointestinal: Denies dysphagia, odynophagia, heartburn, reflux, water brash, abdominal pain or cramps, nausea, vomiting, bloating, diarrhea, constipation, hematemesis, melena, hematochezia  or hemorrhoids. Genitourinary: Denies dysuria, frequency, urgency, nocturia, hesitancy, discharge, hematuria or flank pain. Musculoskeletal: Denies arthralgias, myalgias, stiffness, jt. swelling, pain, limping or strain/sprain.  Skin: Denies pruritus, rash, hives, warts, acne, eczema or change in skin  lesion(s). Neuro: No weakness, tremor, incoordination, spasms, paresthesia or pain. Psychiatric: Denies confusion, memory loss or sensory loss. Endo: Denies change in weight, skin or hair change.  Heme/Lymph: No excessive bleeding, bruising or enlarged lymph nodes.  Physical Exam  There were no vitals taken for this visit.  Appears well nourished, well groomed  and in no distress.  Eyes: PERRLA, EOMs, conjunctiva no swelling or erythema. Sinuses: No frontal/maxillary tenderness ENT/Mouth: EAC's clear, TM's nl w/o erythema, bulging. Nares clear w/o erythema, swelling, exudates. Oropharynx clear without erythema or exudates. Oral hygiene is good. Tongue normal, non obstructing. Hearing intact.  Neck: Supple. Thyroid nl. Car 2+/2+ without bruits, nodes or JVD. Chest: Respirations nl with BS clear & equal w/o rales, rhonchi, wheezing or stridor.  Cor: Heart sounds normal w/ regular rate and rhythm without sig. murmurs, gallops, clicks or rubs. Peripheral pulses normal and equal  without edema.  Abdomen: Soft & bowel sounds normal. Non-tender w/o guarding, rebound, hernias, masses or organomegaly.  Lymphatics: Unremarkable.  Musculoskeletal: Full ROM all peripheral extremities, joint stability, 5/5 strength and normal gait.  Skin: Warm, dry without exposed rashes, lesions or ecchymosis apparent.  Neuro: Cranial nerves intact, reflexes equal bilaterally. Sensory-motor testing grossly intact. Tendon  reflexes grossly intact.  Pysch: Alert & oriented x 3.  Insight and judgement nl & appropriate. No ideations.  Assessment and Plan:  - Continue medication, monitor blood pressure at home.  - Continue DASH diet.  Reminder to go to the ER if any CP,  SOB, nausea, dizziness, severe HA, changes vision/speech.   - Continue diet/meds, exercise,& lifestyle modifications.  - Continue monitor periodic cholesterol/liver & renal functions   - Continue diet, exercise, lifestyle modifications.  - Monitor  appropriate labs. - Continue supplementation.      Discussed  regular exercise, BP monitoring, weight control to achieve/maintain BMI less than 25 and discussed meds and SE's. Recommended labs to assess and monitor clinical status with further disposition pending results of labs. Over 30 minutes of exam, counseling, chart review was performed.   Kirtland Bouchard, MD

## 2021-03-10 ENCOUNTER — Ambulatory Visit (INDEPENDENT_AMBULATORY_CARE_PROVIDER_SITE_OTHER): Payer: Medicare Other | Admitting: Internal Medicine

## 2021-03-10 ENCOUNTER — Encounter: Payer: Self-pay | Admitting: Internal Medicine

## 2021-03-10 ENCOUNTER — Other Ambulatory Visit: Payer: Self-pay

## 2021-03-10 VITALS — BP 132/78 | HR 53 | Temp 97.5°F | Resp 16 | Ht 66.5 in | Wt 204.6 lb

## 2021-03-10 DIAGNOSIS — D696 Thrombocytopenia, unspecified: Secondary | ICD-10-CM

## 2021-03-10 DIAGNOSIS — E559 Vitamin D deficiency, unspecified: Secondary | ICD-10-CM

## 2021-03-10 DIAGNOSIS — R7309 Other abnormal glucose: Secondary | ICD-10-CM | POA: Diagnosis not present

## 2021-03-10 DIAGNOSIS — I10 Essential (primary) hypertension: Secondary | ICD-10-CM | POA: Diagnosis not present

## 2021-03-10 DIAGNOSIS — A419 Sepsis, unspecified organism: Secondary | ICD-10-CM | POA: Diagnosis not present

## 2021-03-10 DIAGNOSIS — N179 Acute kidney failure, unspecified: Secondary | ICD-10-CM

## 2021-03-10 DIAGNOSIS — G4733 Obstructive sleep apnea (adult) (pediatric): Secondary | ICD-10-CM

## 2021-03-10 DIAGNOSIS — E782 Mixed hyperlipidemia: Secondary | ICD-10-CM

## 2021-03-10 DIAGNOSIS — M71122 Other infective bursitis, left elbow: Secondary | ICD-10-CM

## 2021-03-10 DIAGNOSIS — L03114 Cellulitis of left upper limb: Secondary | ICD-10-CM | POA: Diagnosis not present

## 2021-03-10 DIAGNOSIS — M1000 Idiopathic gout, unspecified site: Secondary | ICD-10-CM

## 2021-03-10 DIAGNOSIS — R6520 Severe sepsis without septic shock: Secondary | ICD-10-CM

## 2021-03-10 DIAGNOSIS — N1832 Chronic kidney disease, stage 3b: Secondary | ICD-10-CM | POA: Diagnosis not present

## 2021-03-10 DIAGNOSIS — Z79899 Other long term (current) drug therapy: Secondary | ICD-10-CM

## 2021-03-10 DIAGNOSIS — J449 Chronic obstructive pulmonary disease, unspecified: Secondary | ICD-10-CM

## 2021-03-10 NOTE — Patient Instructions (Signed)

## 2021-03-11 LAB — COMPLETE METABOLIC PANEL WITH GFR
AG Ratio: 0.8 (calc) — ABNORMAL LOW (ref 1.0–2.5)
ALT: 48 U/L — ABNORMAL HIGH (ref 9–46)
AST: 40 U/L — ABNORMAL HIGH (ref 10–35)
Albumin: 2.9 g/dL — ABNORMAL LOW (ref 3.6–5.1)
Alkaline phosphatase (APISO): 105 U/L (ref 35–144)
BUN/Creatinine Ratio: 19 (calc) (ref 6–22)
BUN: 30 mg/dL — ABNORMAL HIGH (ref 7–25)
CO2: 23 mmol/L (ref 20–32)
Calcium: 8.6 mg/dL (ref 8.6–10.3)
Chloride: 105 mmol/L (ref 98–110)
Creat: 1.61 mg/dL — ABNORMAL HIGH (ref 0.70–1.18)
GFR, Est African American: 49 mL/min/{1.73_m2} — ABNORMAL LOW (ref >=60)
GFR, Est Non African American: 43 mL/min/{1.73_m2} — ABNORMAL LOW (ref >=60)
Globulin: 3.5 g/dL (calc) (ref 1.9–3.7)
Glucose, Bld: 85 mg/dL (ref 65–99)
Potassium: 4.6 mmol/L (ref 3.5–5.3)
Sodium: 140 mmol/L (ref 135–146)
Total Bilirubin: 0.5 mg/dL (ref 0.2–1.2)
Total Protein: 6.4 g/dL (ref 6.1–8.1)

## 2021-03-11 LAB — CBC WITH DIFFERENTIAL/PLATELET
Absolute Monocytes: 512 cells/uL (ref 200–950)
Basophils Absolute: 59 cells/uL (ref 0–200)
Basophils Relative: 0.7 %
Eosinophils Absolute: 151 cells/uL (ref 15–500)
Eosinophils Relative: 1.8 %
HCT: 41.4 % (ref 38.5–50.0)
Hemoglobin: 14 g/dL (ref 13.2–17.1)
Lymphs Abs: 1235 cells/uL (ref 850–3900)
MCH: 32.4 pg (ref 27.0–33.0)
MCHC: 33.8 g/dL (ref 32.0–36.0)
MCV: 95.8 fL (ref 80.0–100.0)
MPV: 9.9 fL (ref 7.5–12.5)
Monocytes Relative: 6.1 %
Neutro Abs: 6443 cells/uL (ref 1500–7800)
Neutrophils Relative %: 76.7 %
Platelets: 302 10*3/uL (ref 140–400)
RBC: 4.32 10*6/uL (ref 4.20–5.80)
RDW: 13.1 % (ref 11.0–15.0)
Total Lymphocyte: 14.7 %
WBC: 8.4 10*3/uL (ref 3.8–10.8)

## 2021-03-11 LAB — MAGNESIUM: Magnesium: 2.2 mg/dL (ref 1.5–2.5)

## 2021-03-11 LAB — URIC ACID: Uric Acid, Serum: 3.2 mg/dL — ABNORMAL LOW (ref 4.0–8.0)

## 2021-03-11 NOTE — Progress Notes (Signed)
============================================================ -   Test results slightly outside the reference range are not unusual. If there is anything important, I will review this with you,  otherwise it is considered normal test values.  If you have further questions,  please do not hesitate to contact me at the office or via My Chart.  ============================================================ ============================================================  -  CBC shows Normal Red Cell Count & WBC.   - CMET suggests you are moderately Dehydrated, So it's Very Important   That you increase your water /fluid intake to                                                          prevent Permanent Kidney Damage.  - Usually recommend that you drink at least                                                      equivalent of 6 bottles (16 oz) of fluids   - which approximates 96  -100 oz fluids /day ============================================================ ============================================================  -  Uric Acid / Gout test is negative & OK  ============================================================ ============================================================

## 2021-03-18 ENCOUNTER — Encounter: Payer: Self-pay | Admitting: Internal Medicine

## 2021-03-18 ENCOUNTER — Other Ambulatory Visit: Payer: Self-pay

## 2021-03-18 ENCOUNTER — Ambulatory Visit: Payer: Medicare Other | Admitting: Internal Medicine

## 2021-03-18 DIAGNOSIS — N179 Acute kidney failure, unspecified: Secondary | ICD-10-CM | POA: Diagnosis not present

## 2021-03-18 DIAGNOSIS — M71122 Other infective bursitis, left elbow: Secondary | ICD-10-CM | POA: Diagnosis not present

## 2021-03-18 DIAGNOSIS — D696 Thrombocytopenia, unspecified: Secondary | ICD-10-CM

## 2021-03-18 MED ORDER — DOXYCYCLINE HYCLATE 100 MG PO TABS: 100.0000 mg | ORAL_TABLET | Freq: Two times a day (BID) | ORAL | 0 refills | Status: DC

## 2021-03-18 NOTE — Assessment & Plan Note (Signed)
He had acute on chronic kidney injury hospitalized.  I will repeat a creatinine today.

## 2021-03-18 NOTE — Assessment & Plan Note (Signed)
He was thrombocytopenic on admission but this reversed quickly.  I will repeat a CBC today since linezolid may cause some bone marrow suppression.

## 2021-03-18 NOTE — Assessment & Plan Note (Signed)
He appears to be doing but still has persistent fluctuance of the left olecranon bursa and persistent erythema around his forearm.  I will switch him to oral doxycycline which has a better safety profile after he finishes 2 more days of linezolid.  I will also make referral to orthopedic surgery for evaluation of possible need for incision and drainage of the bursa.  He will follow-up here in 2 weeks.

## 2021-03-18 NOTE — Progress Notes (Signed)
[facility]  Patient Active Problem List   Diagnosis Date Noted  . Septic olecranon bursitis of left elbow     Priority: High  . CKD (chronic kidney disease), stage III ([place]) 03/03/2021  . Cellulitis of left upper extremity 03/01/2021  . AKI (acute kidney injury) ([place]) 03/01/2021  . Thrombocytopenia ([place]) 03/01/2021  . Chronic intermittent hypoxia with obstructive sleep apnea 02/10/2021  . PLMD (periodic limb movement disorder) 01/08/2021  . CPAP (continuous positive airway pressure) dependence 01/08/2021  . Neuropathy 01/08/2021  . Coronary artery calcification seen on CT scan 03/19/2020  . Persistent proteinuria 02/22/2020  . Aortic atherosclerosis ([place]) by Chest CT on 12.18/2020 06/30/2017  . COPD (chronic obstructive pulmonary disease) ([place]) 06/13/2017  . Morbid obesity ([place]) 08/26/2014  . Tobacco use disorder 08/26/2014  . Essential hypertension   . Hyperlipidemia, mixed   . Abnormal glucose   . OSA and COPD overlap syndrome ([place])   . Vitamin D deficiency   . DJD (degenerative joint disease)   . Primary gout   . ED (erectile dysfunction)     Patient's Medications  New Prescriptions   DOXYCYCLINE (VIBRA-TABS) 100 MG TABLET    Take 1 tablet (100 mg total) by mouth 2 (two) times daily.  Previous Medications   BISOPROLOL (ZEBETA) 10 MG TABLET    Take  1 tablet  Daily  for BP   CHOLECALCIFEROL (VITAMIN D3) 125 MCG (5000 UT) CAPS    Take 10,000 Units by mouth every morning.   CLONAZEPAM (KLONOPIN) 1 MG TABLET    Take  1/2 - 1 tablet  at Bedtime  ONLY  if needed forSleep &  limit to 5 days /week to avoid Addiction & Dementia   GABAPENTIN (NEURONTIN) 600 MG TABLET    TAKE 1/2 TO 1 TABLET 2 TO 3 TIMES DAILY AS NEEDED FOR NEUROPATHY PAIN   KRILL OIL 350 MG CAPS    Take 350 mg by mouth every morning.   LINEZOLID (ZYVOX) 600 MG TABLET    Take 1 tablet (600 mg total) by mouth every 12 (twelve) hours for 14 days.   MAGNESIUM 400 MG CAPS    Take  800 mg by mouth every morning.   MELOXICAM (MOBIC) 15 MG TABLET    TAKE 1 TABLET BY MOUTH DAILY AS NEEDED WITH FOOD FOR PAIN & INFLAMMATION   PRESCRIPTION MEDICATION    Inhale into the lungs at bedtime. CPAP   ROSUVASTATIN (CRESTOR) 40 MG TABLET    TAKE 1 TABLET BY MOUTH EVERY DAY FOR CHOLESTEROL   TELMISARTAN (MICARDIS) 80 MG TABLET    TAKE 1 TABLET DAILY FOR BLOOD PRESSURE  Modified Medications   No medications on file  Discontinued Medications   No medications on file    Subjective: Stephen Holland is in for his hospital follow-up visit.  He was hospitalized on 03/01/2021 with left arm cellulitis and olecranon bursitis.  There was no evidence of joint or bone infection at that time.  Blood cultures were negative.  No other cultures were obtained.  He was not evaluated by orthopedic surgery.  He was treated initially with clindamycin then cefazolin, then daptomycin before being discharged on oral linezolid.  He has 2 more days of antibiotics left.  He is feeling better.  He has not had any problems tolerating his antibiotics other than having some intermittent loose stools.  Review of Systems: Review of Systems  Constitutional: Negative for chills, diaphoresis and fever.  Gastrointestinal:  Negative for abdominal pain, diarrhea, nausea and vomiting.  Musculoskeletal: Negative for joint pain.    Past Medical History:  Diagnosis Date  . Colon polyps   . DJD (degenerative joint disease)   . ED (erectile dysfunction)   . Gout   . Hyperlipemia   . Hypertension   . Obstructive sleep apnea    08/2012, cpap setting of 14  . Prostate cancer ([place]) 01/2002   s/p prostatectomy  . Vitamin D deficiency     Social History   Tobacco Use  . Smoking status: Former Smoker    Packs/day: 1.00    Years: 45.00    Pack years: 45.00    Types: Cigarettes  . Smokeless tobacco: Never Used  Vaping Use  . Vaping Use: Never used  Substance Use Topics  . Alcohol use: Yes    Alcohol/week: 15.0 standard  drinks    Types: 15 Standard drinks or equivalent per week    Comment: 03/31/20  2-3 mixed drinks per day  . Drug use: No    Family History  Problem Relation Age of Onset  . Hypertension Mother   . Pulmonary fibrosis Mother   . Stroke Father   . Heart attack Maternal Grandmother   . Stroke Maternal Grandfather   . Stroke Paternal Grandmother   . Colon cancer Paternal Grandfather     No Known Allergies  Objective: Vitals:   03/18/21 0831  BP: 118/75  Pulse: (!) 58  Temp: 98.3 F (36.8 C)  TempSrc: Oral  SpO2: 96%  Weight: 202 lb (91.6 kg)   Body mass index is 32.12 kg/m.  Physical Exam Constitutional:      Comments: He is calm and pleasant.  HENT:     Mouth/Throat:     Mouth: Mucous membranes are moist.     Pharynx: No oropharyngeal exudate or posterior oropharyngeal erythema.  Cardiovascular:     Rate and Rhythm: Normal rate.  Pulmonary:     Effort: Pulmonary effort is normal.  Musculoskeletal:     Comments: He still has some erythema from his mid forearm to his left elbow.  He has some diffuse swelling.  There are 2 open areas over the elbow that have started draining purulent fluid in the last 4 days.  There is some fluctuance over the olecranon bursa.  His arm is nontender to palpation.  He has good range of motion.  Psychiatric:        Mood and Affect: Mood normal.         Lab Results Lab Results  Component Value Date   WBC 8.4 03/10/2021   HGB 14.0 03/10/2021   HCT 41.4 03/10/2021   MCV 95.8 03/10/2021   PLT 302 03/10/2021   CMP     Component Value Date/Time   NA 140 03/10/2021 1025   NA 139 05/08/2020 0922   K 4.6 03/10/2021 1025   CL 105 03/10/2021 1025   CO2 23 03/10/2021 1025   GLUCOSE 85 03/10/2021 1025   BUN 30 (H) 03/10/2021 1025   BUN 29 (H) 05/08/2020 0922   CREATININE 1.61 (H) 03/10/2021 1025   CALCIUM 8.6 03/10/2021 1025   PROT 6.4 03/10/2021 1025   ALBUMIN 4.1 06/13/2017 1004   AST 40 (H) 03/10/2021 1025   ALT 48 (H)  03/10/2021 1025   ALKPHOS 58 06/13/2017 1004   BILITOT 0.5 03/10/2021 1025   GFRNONAA 43 (L) 03/10/2021 1025   GFRAA 49 (L) 03/10/2021 1025     Problem List Items Addressed This  Visit      High   Septic olecranon bursitis of left elbow    He appears to be doing but still has persistent fluctuance of the left olecranon bursa and persistent erythema around his forearm.  I will switch him to oral doxycycline which has a better safety profile after he finishes 2 more days of linezolid.  I will also make referral to orthopedic surgery for evaluation of possible need for incision and drainage of the bursa.  He will follow-up here in 2 weeks.      Relevant Medications   doxycycline (VIBRA-TABS) 100 MG tablet (Start on 03/21/2021)   Other Relevant Orders   CBC   Basic metabolic panel   Ambulatory referral to Orthopedic Surgery     Unprioritized   AKI (acute kidney injury) ([place])    He had acute on chronic kidney injury hospitalized.  I will repeat a creatinine today.      Thrombocytopenia ([place])    He was thrombocytopenic on admission but this reversed quickly.  I will repeat a CBC today since linezolid may cause some bone marrow suppression.          Michel Bickers, MD [facility] [phone] pager   [phone] cell 03/18/2021, 8:54 AM

## 2021-03-19 LAB — BASIC METABOLIC PANEL
BUN/Creatinine Ratio: 18 (calc) (ref 6–22)
BUN: 32 mg/dL — ABNORMAL HIGH (ref 7–25)
CO2: 31 mmol/L (ref 20–32)
Calcium: 10.2 mg/dL (ref 8.6–10.3)
Chloride: 104 mmol/L (ref 98–110)
Creat: 1.81 mg/dL — ABNORMAL HIGH (ref 0.70–1.18)
Glucose, Bld: 92 mg/dL (ref 65–99)
Potassium: 5.8 mmol/L — ABNORMAL HIGH (ref 3.5–5.3)
Sodium: 142 mmol/L (ref 135–146)

## 2021-03-19 LAB — CBC
HCT: 43.6 % (ref 38.5–50.0)
Hemoglobin: 14.6 g/dL (ref 13.2–17.1)
MCH: 31.8 pg (ref 27.0–33.0)
MCHC: 33.5 g/dL (ref 32.0–36.0)
MCV: 95 fL (ref 80.0–100.0)
MPV: 9.3 fL (ref 7.5–12.5)
Platelets: 240 10*3/uL (ref 140–400)
RBC: 4.59 10*6/uL (ref 4.20–5.80)
RDW: 13 % (ref 11.0–15.0)
WBC: 8.6 10*3/uL (ref 3.8–10.8)

## 2021-03-23 ENCOUNTER — Ambulatory Visit: Payer: Medicare Other | Admitting: Orthopaedic Surgery

## 2021-03-23 ENCOUNTER — Encounter: Payer: Self-pay | Admitting: Orthopaedic Surgery

## 2021-03-23 DIAGNOSIS — M7022 Olecranon bursitis, left elbow: Secondary | ICD-10-CM | POA: Diagnosis not present

## 2021-03-23 NOTE — Progress Notes (Signed)
Office Visit Note   Patient: Stephen Holland           Date of Birth: May 31, 1951           MRN: [id number] Visit Date: 03/23/2021              Requested by: Michel Bickers, MD [address] [place],  [place] [address] PCP: [name], MD   Assessment & Plan: Visit Diagnoses:  1. Olecranon bursitis of left elbow     Plan: I was able to use an 18-gauge needle in the olecranon area of the elbow and aspirate fluid from around the area that was consistent more with edema type of fluid.  There was no thickness to this fluid and it was not malodorous.  There was no evidence of purulence.  It thoroughly decompressed the area and I was pleased with getting fluid off of this area.  He will continue doxycycline.  I will have him place Bactroban ointment on those wounds daily after each shower.  I would like to reevaluate him in just 1 week.  All question concerns were answered and addressed.  Follow-Up Instructions: Return in about 1 week (around 03/30/2021).   Orders:  No orders of the defined types were placed in this encounter.  No orders of the defined types were placed in this encounter.     Procedures: No procedures performed   Clinical Data: No additional findings.   Subjective: Chief Complaint  Patient presents with  . Left Elbow - Pain  The patient is a 70 year old gentleman seen today for left elbow olecranon bursitis.  He is also had associated cellulitis of the left arm.  He was recently hospitalized due to this and was on several days of IV antibiotics.  He had hypotension and acute renal insufficiency.  He was in the hospital about 6 days according to he and his wife.  He is now on oral doxycycline since this past Thursday.  He is brought in here to see if there is any surgical intervention that is needed.  He denies any fever or chills today.  He says the swelling is gone down dramatically over the last week but it is still somewhat  painful.  HPI  Review of Systems There is currently listed no headache, chest pain, shortness of breath, fever, chills, nausea, vomiting  Objective: Vital Signs: There were no vitals taken for this visit.  Physical Exam He is alert and orient x3 and in no acute distress Ortho Exam Examination of the left elbow shows his range of motion is full so there is no evidence of a septic joint.  There are 2 wounds on the posterior aspect of his elbow that are weeping but no gross purulence.  There is minimal fluid in the back of the elbow.  There is some fullness of the soft tissues in the forearm but no evidence of compartment syndrome his finger and thumb and moves nicely.  He can see the skin is peeling off from where it had been swelling quite significantly before. Specialty Comments:  No specialty comments available.  Imaging: No results found.   PMFS History: Patient Active Problem List   Diagnosis Date Noted  . CKD (chronic kidney disease), stage III ([place]) 03/03/2021  . Septic olecranon bursitis of left elbow   . Cellulitis of left upper extremity 03/01/2021  . AKI (acute kidney injury) ([place]) 03/01/2021  . Thrombocytopenia ([place]) 03/01/2021  . Chronic intermittent hypoxia with obstructive sleep  apnea 02/10/2021  . PLMD (periodic limb movement disorder) 01/08/2021  . CPAP (continuous positive airway pressure) dependence 01/08/2021  . Neuropathy 01/08/2021  . Coronary artery calcification seen on CT scan 03/19/2020  . Persistent proteinuria 02/22/2020  . Aortic atherosclerosis ([place]) by Chest CT on 12.18/2020 06/30/2017  . COPD (chronic obstructive pulmonary disease) ([place]) 06/13/2017  . Morbid obesity ([place]) 08/26/2014  . Tobacco use disorder 08/26/2014  . Essential hypertension   . Hyperlipidemia, mixed   . Abnormal glucose   . OSA and COPD overlap syndrome ([place])   . Vitamin D deficiency   . DJD (degenerative joint disease)   . Primary gout   . ED (erectile dysfunction)     Past Medical History:  Diagnosis Date  . Colon polyps   . DJD (degenerative joint disease)   . ED (erectile dysfunction)   . Gout   . Hyperlipemia   . Hypertension   . Obstructive sleep apnea    08/2012, cpap setting of 14  . Prostate cancer ([place]) 01/2002   s/p prostatectomy  . Vitamin D deficiency     Family History  Problem Relation Age of Onset  . Hypertension Mother   . Pulmonary fibrosis Mother   . Stroke Father   . Heart attack Maternal Grandmother   . Stroke Maternal Grandfather   . Stroke Paternal Grandmother   . Colon cancer Paternal Grandfather     Past Surgical History:  Procedure Laterality Date  . COLONOSCOPY WITH PROPOFOL N/A 09/06/2016   Procedure: COLONOSCOPY WITH PROPOFOL;  Surgeon: Garlan Fair, MD;  Location: [facility];  Service: Endoscopy;  Laterality: N/A;  . colonscopy     x 3 with polyp one time  . PROSTATECTOMY  2003  . SKIN CANCER EXCISION     Social History   Occupational History    Comment: truck driver  Tobacco Use  . Smoking status: Former Smoker    Packs/day: 1.00    Years: 45.00    Pack years: 45.00    Types: Cigarettes  . Smokeless tobacco: Never Used  Vaping Use  . Vaping Use: Never used  Substance and Sexual Activity  . Alcohol use: Yes    Alcohol/week: 15.0 standard drinks    Types: 15 Standard drinks or equivalent per week    Comment: 03/31/20  2-3 mixed drinks per day  . Drug use: No  . Sexual activity: Not on file

## 2021-03-24 ENCOUNTER — Encounter: Payer: Self-pay | Admitting: Adult Health

## 2021-03-24 NOTE — Progress Notes (Signed)
MEDICARE WELLNESS Assessment:   Encounter for Medicare annual wellness exam Due annually   Aortic atherosclerosis ([place]) Control blood pressure, cholesterol, glucose, increase exercise.   CAD Control blood pressure, cholesterol, glucose, increase exercise.  Dr. Gwenlyn Found following annually  Denies angina   Essential hypertension - continue medications, DASH diet, exercise and monitor at home. Call if greater than 130/80.  -     CBC with Differential/Platelet -     CMP/GFR -     TSH  OSA and COPD overlap syndrome (HCC)/ chronic intermittent hypoxia  Continue CPAP- patient states there is a benefit with it, No longer smoking Dr. Brett Fairy following [place]  Morbid Obesity with co morbidities ([place]) - BMI 30+ with OSA - long discussion about weight loss, diet, and exercise  Former smoker (45+ pack year, quit 02/2021) - over 45 pack year smoking history, quit 2022, no symptoms.  - continue yearly low dose screening CT - overdue, order placed after discussion  Mixed hyperlipidemia -continue medications, check lipids, decrease fatty foods, increase activity.  - discussed LDL goal <70, currently max dose rosuvastatin, lifestyle emphasized with goal to not need a second med - reduce eggs, bacon, meat; aim for a few meatless meals -     Lipid panel  Prediabetes Discussed general issues about diabetes pathophysiology and management., Educational material distributed., Suggested low cholesterol diet., Encouraged aerobic exercise., Discussed foot care., Reminded to get yearly retinal exam. -     Check A1C q74m  Vitamin D deficiency Continue supplement  Idiopathic gout, unspecified chronicity, unspecified site Gout- recheck Uric acid as needed, Diet discussed, continue medications.  Medication management -     Magnesium  Erectile dysfunction, unspecified erectile dysfunction type Weight loss advised  Osteoarthritis, unspecified osteoarthritis type, unspecified site Weight  loss advised  Chronic obstructive pulmonary disease, unspecified COPD type ([place]) Declines inhalers at this time, has quit smoking, maintenance encouraged and strategies discussed  CKD  With recent AKI Increase fluids, avoid NSAIDS, monitor sugars, will monitor  Chronic lumbar back pain/lumbar DDD Poor surgical candidate, injections didn't help On gabapentin Weight loss encouraged Restart water aerobics Receptive to PT - referral placed  Cellulitis/olecranon bursitis Resolving, Dr. Ninfa Linden has follow up planned next week No concerning findings today Avoid leaning on elbow   Over 30 minutes of exam, counseling, chart review, and critical decision making was performed Future Appointments  Date Time Provider [place]  03/30/2021  9:45 AM Mcarthur Rossetti, MD [facility] None  04/02/2021  9:00 AM Michel Bickers, MD [facility]  06/04/2021 10:30 AM Ward Givens, NP [facility] None  06/25/2021 10:30 AM [name], MD [facility] None  09/08/2021 11:00 AM Garnet Sierras, NP [facility] None  12/21/2021  2:00 PM [name], MD [facility] None  03/25/2022 10:30 AM Liane Comber, NP [facility] None    Plan:   During the course of the visit the patient was educated and counseled about appropriate screening and preventive services including:    Pneumococcal vaccine   Influenza vaccine  Prevnar 13  Td vaccine  Screening electrocardiogram  Colorectal cancer screening  Diabetes screening  Glaucoma screening  Nutrition counseling    Subjective:  Stephen Holland is a 70 y.o. male who presents for Medicare Annual Wellness Visit and 3 month follow up for HTN, hyperlipidemia, prediabetes, and vitamin D Def.   On 03/01/2021 patient was admitted with Sepsis attributed to a septic Lt Olecranon Bursitis & cellulitis of the LUE. Admission labs showed AKI which recovered with IVF rehydration and  he also had an initial thrombocytopenia which resolved during  hospitalization. Patient was treated initially with empiric IV Abx's and ID consultant recommended switching to oral Zyvox (linezolid) 600 mg bid/q12h for 14 day. Had follow up with Dr. Ninfa Linden this week, drained bursa and reports no infection noted at that time, has another follow up next week.   He recently quit smoking 02/2021, 50+ pack year smoking history. He has COPD per imaging, not on inhalers at this time. He has OSA on CPAP (Dr. Brett Fairy following). Last low dose screening CT was 10/2019 without concerning nodules, did have unchanged lungs 03/2020 chest CT.    Dr. Leta Baptist for neuropathy 03/2020, takes gabapentin. Has history of back pain/neck pain with DDD, has had injections in the past past but minimal benefit, was advised too high risk for surgery. Has not done any PT and receptive.    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   Takes klonopin for sleep but will take every other day, gabapentin   BMI is Body mass index is 32.12 kg/m., he has not been working on diet and exercise, had back aching with walking more than 100 yards and has to stop to rest. Did water aerobics prior to pandemic, willing to restart.  Wt Readings from Last 3 Encounters:  03/25/21 202 lb (91.6 kg)  03/18/21 202 lb (91.6 kg)  03/10/21 204 lb 9.6 oz (92.8 kg)   HE was having DOE, saw Dr. Gwenlyn Found, had CT coronary 04/2020, showed non obstructive CAD. Also R carotid narrowing that was found by dentist. Cardiology plans to follow annually.   His blood pressure has been controlled at home, today their BP is BP: 120/68  He does workout. He denies chest pain, shortness of breath, dizziness.  Aortic atherosclerosis per CT 11/16/2019.    He is on cholesterol medication (rosvuastatin 40 mg daily) and denies myalgias. His cholesterol is not at goal. The cholesterol last visit was:   Lab Results  Component Value Date   CHOL 149 12/03/2020   HDL 38 (L) 12/03/2020   LDLCALC 81 12/03/2020   TRIG  208 (H) 12/03/2020   CHOLHDL 3.9 12/03/2020    He has been working on diet and exercise for prediabetes, and denies paresthesia of the feet, polydipsia, polyuria and visual disturbances.  Last A1C in the office was:  Lab Results  Component Value Date   HGBA1C 6.1 (H) 03/04/2021   He has CKD IIIb monitored at this office, he has seen seen nephrology in 2021 He was having proteinuria, had normal protein electrophoresis urine and normal work up, he is on ARB. Renal US 02/2021 showed Increased echogenicity of renal parenchyma is noted bilaterally suggesting medical renal disease.  Lab Results  Component Value Date   GFRNONAA 43 (L) 03/10/2021   GFRNONAA 42 (L) 03/06/2021   GFRNONAA 38 (L) 03/04/2021   Patient is on Vitamin D supplement.   Lab Results  Component Value Date   VD25OH 76 12/03/2020     Patient is off of allopurinol for gout (due to suspected allergy- rash) and does not report a recent flare.  Lab Results  Component Value Date   LABURIC 3.2 (L) 03/10/2021   Medication Review:   Current Outpatient Medications (Cardiovascular):  .  bisoprolol (ZEBETA) 10 MG tablet, Take  1 tablet  Daily  for BP (Patient taking differently: Take 10 mg by mouth every morning. for BP) .  rosuvastatin (CRESTOR) 40 MG tablet, TAKE 1 TABLET BY MOUTH EVERY  DAY FOR CHOLESTEROL (Patient taking differently: Take 40 mg by mouth every morning. FOR CHOLESTEROL) .  telmisartan (MICARDIS) 80 MG tablet, TAKE 1 TABLET DAILY FOR BLOOD PRESSURE (Patient taking differently: Take 80 mg by mouth every morning. FOR BLOOD PRESSURE)   Current Outpatient Medications (Analgesics):  .  meloxicam (MOBIC) 15 MG tablet, TAKE 1 TABLET BY MOUTH DAILY AS NEEDED WITH FOOD FOR PAIN & INFLAMMATION (Patient taking differently: Take 15 mg by mouth every morning. FOR PAIN & INFLAMMATION)   Current Outpatient Medications (Other):  Marland Kitchen  Cholecalciferol (VITAMIN D3) 125 MCG (5000 UT) CAPS, Take 10,000 Units by mouth every  morning. .  clonazePAM (KLONOPIN) 1 MG tablet, Take  1/2 - 1 tablet  at Bedtime  ONLY  if needed forSleep &  limit to 5 days /week to avoid Addiction & Dementia (Patient taking differently: Take 1 mg by mouth at bedtime as needed (restless legs). limit to 5 days /week to avoid Addiction & Dementia) .  doxycycline (VIBRA-TABS) 100 MG tablet, Take 1 tablet (100 mg total) by mouth 2 (two) times daily. Marland Kitchen  gabapentin (NEURONTIN) 600 MG tablet, TAKE 1/2 TO 1 TABLET 2 TO 3 TIMES DAILY AS NEEDED FOR NEUROPATHY PAIN (Patient taking differently: Take 600 mg by mouth 2 (two) times daily. FOR NEUROPATHY PAIN) .  Krill Oil 350 MG CAPS, Take 350 mg by mouth every morning. .  Magnesium 400 MG CAPS, Take 800 mg by mouth every morning. Marland Kitchen  PRESCRIPTION MEDICATION, Inhale into the lungs at bedtime. CPAP  Current Problems (verified) Patient Active Problem List   Diagnosis Date Noted  . CKD (chronic kidney disease), stage III ([place]) 03/03/2021  . Septic olecranon bursitis of left elbow   . Cellulitis of left upper extremity 03/01/2021  . Chronic intermittent hypoxia with obstructive sleep apnea 02/10/2021  . PLMD (periodic limb movement disorder) 01/08/2021  . CPAP (continuous positive airway pressure) dependence 01/08/2021  . Neuropathy 01/08/2021  . Coronary artery calcification seen on CT scan 03/19/2020  . Persistent proteinuria 02/22/2020  . Aortic atherosclerosis ([place]) by Chest CT on 12.18/2020 06/30/2017  . COPD (chronic obstructive pulmonary disease) ([place]) 06/13/2017  . Morbid obesity ([place]) 08/26/2014  . Former smoker (45+ pack year history, quit 02/2021) 08/26/2014  . Essential hypertension   . Hyperlipidemia, mixed   . Abnormal glucose   . OSA and COPD overlap syndrome ([place])   . Vitamin D deficiency   . DJD (degenerative joint disease)   . Primary gout   . ED (erectile dysfunction)     Screening Tests Immunization History  Administered Date(s) Administered  . Influenza Split 08/26/2014  .  Influenza, High Dose Seasonal PF 08/12/2016, 10/17/2018, 09/05/2019  . Influenza, Seasonal, Injecte, Preservative Fre 10/27/2015  . PFIZER(Purple Top)SARS-COV-2 Vaccination 01/05/2020, 01/26/2020, 09/27/2020  . PPD Test 05/23/2014, 07/23/2015  . Pneumococcal Conjugate-13 08/26/2014  . Pneumococcal Polysaccharide-23 05/05/2010, 01/30/2016  . Td 11/30/2003  . Tdap 08/12/2010, 08/21/2020, 03/01/2021  . Zoster 04/29/2013    Preventative care: Last colonoscopy: 08/2016 polyp, Dr. Collene Mares, 10 year recall  CT chest 10/2019 IMPRESSION:  Lung-RADS 1, negative. Continue annual screening with low-dose chest CT without contrast in 12 months. Aortic Atherosclerosis (ICD10-I70.0) and Emphysema (ICD10-J43.9).  Prior vaccinations: TD or Tdap: 02/2021  Influenza: 2020, ? Had at pharacy  Pneumococcal: 2011 Prevnar13: 2015 Shingles/Zostavax: 2014-  Covid 19: 4/4, 2021 - requested booster information   Names of Other Physician/Practitioners you currently use: 1. [facility] here for primary care 2. Dr. Tommy Rainwater,  eye doctor, last visit 2022, had cataracts in feb 3. Dr. Kalman Shan, dentist, last visit 2022 q 6 months  Patient Care Team: [name], MD as PCP - General (Internal Medicine)  Allergies No Known Allergies  SURGICAL HISTORY He  has a past surgical history that includes Prostatectomy (2003); colonscopy; Colonoscopy with propofol (N/A, 09/06/2016); Skin cancer excision; and Cataract extraction, bilateral (Bilateral, 12/2020). FAMILY HISTORY His family history includes Colon cancer in his paternal grandfather; Heart attack in his maternal grandmother; Hypertension in his mother; Pulmonary fibrosis in his mother; Stroke in his father, maternal grandfather, and paternal grandmother. SOCIAL HISTORY He  reports that he quit smoking about 3 weeks ago. His smoking use included cigarettes. He has a 45.00 pack-year smoking history. He has never used smokeless  tobacco. He reports current alcohol use of about 15.0 standard drinks of alcohol per week. He reports that he does not use drugs.  MEDICARE WELLNESS OBJECTIVES: Physical activity: Current Exercise Habits: The patient does not participate in regular exercise at present, Exercise limited by: orthopedic condition(s) Cardiac risk factors: Cardiac Risk Factors include: advanced age (>69men, >73 women);dyslipidemia;male gender;hypertension;smoking/ tobacco exposure;obesity (BMI >30kg/m2);sedentary lifestyle Depression/mood screen:   Depression screen [facility] 2/9 03/25/2021  Decreased Interest 0  Down, Depressed, Hopeless 0  PHQ - 2 Score 0    ADLs:  In your present state of health, do you have any difficulty performing the following activities: 03/25/2021 03/10/2021  Hearing? N N  Vision? N N  Difficulty concentrating or making decisions? N N  Walking or climbing stairs? N N  Dressing or bathing? N N  Doing errands, shopping? N N  Some recent data might be hidden     Cognitive Testing  Alert? Yes  Normal Appearance?Yes  Oriented to person? Yes  Place? Yes   Time? Yes  Recall of three objects?  Yes  Can perform simple calculations? Yes  Displays appropriate judgment?Yes  Can read the correct time from a watch face?Yes  EOL planning: Does Patient Have a Medical Advance Directive?: Yes Type of Advance Directive: [place] will Does patient want to make changes to medical advance directive?: No - Patient declined Copy of [place] in Chart?: Yes - validated most recent copy scanned in chart (See row information) Would patient like information on creating a medical advance directive?: No - Patient declined   Objective:   Today's Vitals   03/25/21 1016  BP: 120/68  Pulse: 62  Temp: (!) 96.3 F (35.7 C)  SpO2: 99%  Weight: 202 lb (91.6 kg)  Height: 5' 6.5" (1.689 m)   Body mass index is 32.12 kg/m.  General appearance: alert, no distress,  WD/WN, male HEENT: normocephalic, sclerae anicteric, TMs pearly, nares patent, no discharge or erythema, pharynx normal Oral cavity: MMM, no lesions Neck: supple, no lymphadenopathy, no thyromegaly, no masses Heart: RRR, normal S1, S2, no murmurs Lungs: CTA bilaterally, no wheezes, rhonchi, or rales Abdomen: +bs, soft, obese non tender, non distended, no masses, no hepatomegaly, no splenomegaly Musculoskeletal: no obvious deformity; left elbow with mildly tender and swollen olecranon bursa Extremities: no edema, no cyanosis, no clubbing Pulses: 2+ symmetric, upper and lower extremities, normal cap refill Neurological: alert, oriented x 3, CN2-12 intact, strength normal upper extremities and lower extremities, sensation normal throughout, DTRs 2+ throughout, no cerebellar signs, gait normal Psychiatric: normal affect, behavior normal, pleasant  Skin: left elbow and forearm faintly pink, thickened texture, peeling skin to wrist  Medicare Attestation I have personally reviewed: The  patient's medical and social history Their use of alcohol, tobacco or illicit drugs Their current medications and supplements The patient's functional ability including ADLs,fall risks, home safety risks, cognitive, and hearing and visual impairment Diet and physical activities Evidence for depression or mood disorders  The patient's weight, height, BMI, and visual acuity have been recorded in the chart.  I have made referrals, counseling, and provided education to the patient based on review of the above and I have provided the patient with a written personalized care plan for preventive services.     Izora Ribas, NP   03/25/2021

## 2021-03-25 ENCOUNTER — Encounter: Payer: Self-pay | Admitting: Adult Health

## 2021-03-25 ENCOUNTER — Ambulatory Visit (INDEPENDENT_AMBULATORY_CARE_PROVIDER_SITE_OTHER): Payer: Medicare Other | Admitting: Adult Health

## 2021-03-25 ENCOUNTER — Other Ambulatory Visit: Payer: Self-pay

## 2021-03-25 VITALS — BP 120/68 | HR 62 | Temp 96.3°F | Ht 66.5 in | Wt 202.0 lb

## 2021-03-25 DIAGNOSIS — E782 Mixed hyperlipidemia: Secondary | ICD-10-CM | POA: Diagnosis not present

## 2021-03-25 DIAGNOSIS — G4734 Idiopathic sleep related nonobstructive alveolar hypoventilation: Secondary | ICD-10-CM | POA: Diagnosis not present

## 2021-03-25 DIAGNOSIS — G4733 Obstructive sleep apnea (adult) (pediatric): Secondary | ICD-10-CM | POA: Diagnosis not present

## 2021-03-25 DIAGNOSIS — R801 Persistent proteinuria, unspecified: Secondary | ICD-10-CM | POA: Diagnosis not present

## 2021-03-25 DIAGNOSIS — R6889 Other general symptoms and signs: Secondary | ICD-10-CM | POA: Diagnosis not present

## 2021-03-25 DIAGNOSIS — M519 Unspecified thoracic, thoracolumbar and lumbosacral intervertebral disc disorder: Secondary | ICD-10-CM

## 2021-03-25 DIAGNOSIS — J449 Chronic obstructive pulmonary disease, unspecified: Secondary | ICD-10-CM | POA: Diagnosis not present

## 2021-03-25 DIAGNOSIS — N529 Male erectile dysfunction, unspecified: Secondary | ICD-10-CM

## 2021-03-25 DIAGNOSIS — G8929 Other chronic pain: Secondary | ICD-10-CM

## 2021-03-25 DIAGNOSIS — N1831 Chronic kidney disease, stage 3a: Secondary | ICD-10-CM

## 2021-03-25 DIAGNOSIS — F17210 Nicotine dependence, cigarettes, uncomplicated: Secondary | ICD-10-CM

## 2021-03-25 DIAGNOSIS — I2510 Atherosclerotic heart disease of native coronary artery without angina pectoris: Secondary | ICD-10-CM

## 2021-03-25 DIAGNOSIS — F17200 Nicotine dependence, unspecified, uncomplicated: Secondary | ICD-10-CM

## 2021-03-25 DIAGNOSIS — Z0001 Encounter for general adult medical examination with abnormal findings: Secondary | ICD-10-CM

## 2021-03-25 DIAGNOSIS — Z9989 Dependence on other enabling machines and devices: Secondary | ICD-10-CM

## 2021-03-25 DIAGNOSIS — G4761 Periodic limb movement disorder: Secondary | ICD-10-CM

## 2021-03-25 DIAGNOSIS — Z0000 Encounter for general adult medical examination without abnormal findings: Secondary | ICD-10-CM

## 2021-03-25 DIAGNOSIS — M1000 Idiopathic gout, unspecified site: Secondary | ICD-10-CM

## 2021-03-25 DIAGNOSIS — E559 Vitamin D deficiency, unspecified: Secondary | ICD-10-CM

## 2021-03-25 DIAGNOSIS — I10 Essential (primary) hypertension: Secondary | ICD-10-CM | POA: Diagnosis not present

## 2021-03-25 DIAGNOSIS — R7309 Other abnormal glucose: Secondary | ICD-10-CM | POA: Diagnosis not present

## 2021-03-25 DIAGNOSIS — M545 Low back pain, unspecified: Secondary | ICD-10-CM

## 2021-03-25 DIAGNOSIS — I700 Atherosclerosis of aorta: Secondary | ICD-10-CM

## 2021-03-25 DIAGNOSIS — Z87891 Personal history of nicotine dependence: Secondary | ICD-10-CM

## 2021-03-25 LAB — CBC WITH DIFFERENTIAL/PLATELET
Absolute Monocytes: 2304 cells/uL — ABNORMAL HIGH (ref 200–950)
Basophils Absolute: 156 cells/uL (ref 0–200)
Basophils Relative: 1.3 %
Eosinophils Absolute: 72 cells/uL (ref 15–500)
Eosinophils Relative: 0.6 %
HCT: 39.7 % (ref 38.5–50.0)
Hemoglobin: 13.5 g/dL (ref 13.2–17.1)
Lymphs Abs: 2928 cells/uL (ref 850–3900)
MCH: 31.3 pg (ref 27.0–33.0)
MCHC: 34 g/dL (ref 32.0–36.0)
MCV: 92.1 fL (ref 80.0–100.0)
MPV: 11.2 fL (ref 7.5–12.5)
Monocytes Relative: 19.2 %
Neutro Abs: 6540 cells/uL (ref 1500–7800)
Neutrophils Relative %: 54.5 %
Platelets: 124 10*3/uL — ABNORMAL LOW (ref 140–400)
RBC: 4.31 10*6/uL (ref 4.20–5.80)
RDW: 12.8 % (ref 11.0–15.0)
Total Lymphocyte: 24.4 %
WBC: 12 10*3/uL — ABNORMAL HIGH (ref 3.8–10.8)

## 2021-03-25 LAB — COMPLETE METABOLIC PANEL WITH GFR
AG Ratio: 1.3 (calc) (ref 1.0–2.5)
ALT: 23 U/L (ref 9–46)
AST: 19 U/L (ref 10–35)
Albumin: 4 g/dL (ref 3.6–5.1)
Alkaline phosphatase (APISO): 64 U/L (ref 35–144)
BUN/Creatinine Ratio: 26 (calc) — ABNORMAL HIGH (ref 6–22)
BUN: 40 mg/dL — ABNORMAL HIGH (ref 7–25)
CO2: 26 mmol/L (ref 20–32)
Calcium: 10.1 mg/dL (ref 8.6–10.3)
Chloride: 104 mmol/L (ref 98–110)
Creat: 1.56 mg/dL — ABNORMAL HIGH (ref 0.70–1.18)
GFR, Est African American: 51 mL/min/{1.73_m2} — ABNORMAL LOW (ref >=60)
GFR, Est Non African American: 44 mL/min/{1.73_m2} — ABNORMAL LOW (ref >=60)
Globulin: 3.1 g/dL (calc) (ref 1.9–3.7)
Glucose, Bld: 75 mg/dL (ref 65–99)
Potassium: 4.4 mmol/L (ref 3.5–5.3)
Sodium: 139 mmol/L (ref 135–146)
Total Bilirubin: 0.6 mg/dL (ref 0.2–1.2)
Total Protein: 7.1 g/dL (ref 6.1–8.1)

## 2021-03-25 LAB — LIPID PANEL
Cholesterol: 111 mg/dL (ref <200)
HDL: 38 mg/dL — ABNORMAL LOW (ref >=40)
LDL Cholesterol (Calc): 46 mg/dL (calc)
Non-HDL Cholesterol (Calc): 73 mg/dL (calc) (ref <130)
Total CHOL/HDL Ratio: 2.9 (calc) (ref <5.0)
Triglycerides: 200 mg/dL — ABNORMAL HIGH (ref <150)

## 2021-03-25 LAB — TSH: TSH: 1.17 mIU/L (ref 0.40–4.50)

## 2021-03-25 LAB — MAGNESIUM: Magnesium: 1.7 mg/dL (ref 1.5–2.5)

## 2021-03-25 NOTE — Patient Instructions (Addendum)
Stephen Holland , Thank you for taking time to come for your Medicare Wellness Visit. I appreciate your ongoing commitment to your health goals. Please review the following plan we discussed and let me know if I can assist you in the future.   These are the goals we discussed: Goals    . DIET - INCREASE WATER INTAKE     Aim for 65+ fluid ounces daily     . Exercise 150 min/wk Moderate Activity     Suggest water aerobics    . Maintain smoking abstinence    . Reduce alcohol intake     Limit to max 1-2/day, avoid drinking daily        This is a list of the screening recommended for you and due dates:  Health Maintenance  Topic Date Due  . Flu Shot  06/29/2021  . Colon Cancer Screening  09/06/2026  . Tetanus Vaccine  03/02/2031  . COVID-19 Vaccine  Completed  .  Hepatitis C: One time screening is recommended by Center for Disease Control  (CDC) for  adults born from [birth year] through 1965.   Completed  . Pneumonia vaccines  Completed  . HPV Vaccine  Aged Out    Try reducing eggs, bacon, shellfish (saturate fat)  Try beans, whole grains, nuts/seeds (increased soluble fiber - also could do citrucel or benefiber supplement - also good for gut health)  Aim for a few meat less meals per week - (reducing meat intake and replacing with fiber will increase life expectancy by several years)  Can do probiotic for 1-2 months then stop - continue high fiber diet      High-Fiber Eating Plan Fiber, also called dietary fiber, is a type of carbohydrate. It is found foods such as fruits, vegetables, whole grains, and beans. A high-fiber diet can have many health benefits. Your health care provider may recommend a high-fiber diet to help:  Prevent constipation. Fiber can make your bowel movements more regular.  Lower your cholesterol.  Relieve the following conditions: ? Inflammation of veins in the anus (hemorrhoids). ? Inflammation of specific areas of the digestive tract (uncomplicated  diverticulosis). ? A problem of the large intestine, also called the colon, that sometimes causes pain and diarrhea (irritable bowel syndrome, or IBS).  Prevent overeating as part of a weight-loss plan.  Prevent heart disease, type 2 diabetes, and certain cancers. What are tips for following this plan? Reading food labels  Check the nutrition facts label on food products for the amount of dietary fiber. Choose foods that have 5 grams of fiber or more per serving.  The goals for recommended daily fiber intake include: ? Men (age 62 or younger): 34-38 g. ? Men (over age 37): 28-34 g. ? Women (age 21 or younger): 25-28 g. ? Women (over age 72): 22-25 g. Your daily fiber goal is _____________ g.   Shopping  Choose whole fruits and vegetables instead of processed forms, such as apple juice or applesauce.  Choose a wide variety of high-fiber foods such as avocados, lentils, oats, and kidney beans.  Read the nutrition facts label of the foods you choose. Be aware of foods with added fiber. These foods often have high sugar and sodium amounts per serving. Cooking  Use whole-grain flour for baking and cooking.  Cook with brown rice instead of white rice. Meal planning  Start the day with a breakfast that is high in fiber, such as a cereal that contains 5 g of fiber or more per  serving.  Eat breads and cereals that are made with whole-grain flour instead of refined flour or white flour.  Eat brown rice, bulgur wheat, or millet instead of white rice.  Use beans in place of meat in soups, salads, and pasta dishes.  Be sure that half of the grains you eat each day are whole grains. General information  You can get the recommended daily intake of dietary fiber by: ? Eating a variety of fruits, vegetables, grains, nuts, and beans. ? Taking a fiber supplement if you are not able to take in enough fiber in your diet. It is better to get fiber through food than from a  supplement.  Gradually increase how much fiber you consume. If you increase your intake of dietary fiber too quickly, you may have bloating, cramping, or gas.  Drink plenty of water to help you digest fiber.  Choose high-fiber snacks, such as berries, raw vegetables, nuts, and popcorn. What foods should I eat? Fruits Berries. Pears. Apples. Oranges. Avocado. Prunes and raisins. Dried figs. Vegetables Sweet potatoes. Spinach. Kale. Artichokes. Cabbage. Broccoli. Cauliflower. Green peas. Carrots. Squash. Grains Whole-grain breads. Multigrain cereal. Oats and oatmeal. Brown rice. Barley. Bulgur wheat. LeChee. Quinoa. Bran muffins. Popcorn. Rye wafer crackers. Meats and other proteins Navy beans, kidney beans, and pinto beans. Soybeans. Split peas. Lentils. Nuts and seeds. Dairy Fiber-fortified yogurt. Beverages Fiber-fortified soy milk. Fiber-fortified orange juice. Other foods Fiber bars. The items listed above may not be a complete list of recommended foods and beverages. Contact a dietitian for more information. What foods should I avoid? Fruits Fruit juice. Cooked, strained fruit. Vegetables Fried potatoes. Canned vegetables. Well-cooked vegetables. Grains White bread. Pasta made with refined flour. White rice. Meats and other proteins Fatty cuts of meat. Fried chicken or fried fish. Dairy Milk. Yogurt. Cream cheese. Sour cream. Fats and oils Butters. Beverages Soft drinks. Other foods Cakes and pastries. The items listed above may not be a complete list of foods and beverages to avoid. Talk with your dietitian about what choices are best for you. Summary  Fiber is a type of carbohydrate. It is found in foods such as fruits, vegetables, whole grains, and beans.  A high-fiber diet has many benefits. It can help to prevent constipation, lower blood cholesterol, aid weight loss, and reduce your risk of heart disease, diabetes, and certain cancers.  Increase your intake of  fiber gradually. Increasing fiber too quickly may cause cramping, bloating, and gas. Drink plenty of water while you increase the amount of fiber you consume.  The best sources of fiber include whole fruits and vegetables, whole grains, nuts, seeds, and beans. This information is not intended to replace advice given to you by your health care provider. Make sure you discuss any questions you have with your health care provider. Document Revised: 03/20/2020 Document Reviewed: 03/20/2020 Elsevier Patient Education  2021 [employer].

## 2021-03-30 ENCOUNTER — Ambulatory Visit: Payer: Medicare Other | Admitting: Orthopaedic Surgery

## 2021-03-30 ENCOUNTER — Encounter: Payer: Self-pay | Admitting: Orthopaedic Surgery

## 2021-03-30 DIAGNOSIS — L03114 Cellulitis of left upper limb: Secondary | ICD-10-CM

## 2021-03-30 DIAGNOSIS — M7022 Olecranon bursitis, left elbow: Secondary | ICD-10-CM | POA: Diagnosis not present

## 2021-03-30 NOTE — Progress Notes (Signed)
The patient is a 70 year old gentleman that I saw last week for olecranon bursitis.  He had been even hospitalized for this due to significant soft tissue swelling and cellulitis.  He feels much better overall and he said the swelling is still a little bit there but much better.  Examination of his left elbow shows no redness at all.  The wounds have healed over nicely.  He has good range of motion of the elbow.  There is some slight swelling of the forearm but again no cellulitis.  Overall he does seem to have improved quite a bit.  This point he will finish the course of antibiotics and follow-up with me as needed.  However, if things worsen at all he needs to let us know and be seen.  All questions and concerns were answered and addressed.

## 2021-04-02 ENCOUNTER — Ambulatory Visit: Payer: Medicare Other | Admitting: Internal Medicine

## 2021-04-08 DIAGNOSIS — C44329 Squamous cell carcinoma of skin of other parts of face: Secondary | ICD-10-CM | POA: Diagnosis not present

## 2021-04-08 DIAGNOSIS — D225 Melanocytic nevi of trunk: Secondary | ICD-10-CM | POA: Diagnosis not present

## 2021-04-08 DIAGNOSIS — L814 Other melanin hyperpigmentation: Secondary | ICD-10-CM | POA: Diagnosis not present

## 2021-04-08 DIAGNOSIS — L570 Actinic keratosis: Secondary | ICD-10-CM | POA: Diagnosis not present

## 2021-04-08 DIAGNOSIS — L578 Other skin changes due to chronic exposure to nonionizing radiation: Secondary | ICD-10-CM | POA: Diagnosis not present

## 2021-04-08 DIAGNOSIS — L821 Other seborrheic keratosis: Secondary | ICD-10-CM | POA: Diagnosis not present

## 2021-04-08 DIAGNOSIS — D485 Neoplasm of uncertain behavior of skin: Secondary | ICD-10-CM | POA: Diagnosis not present

## 2021-04-16 ENCOUNTER — Ambulatory Visit: Payer: Medicare Other | Attending: Adult Health | Admitting: Rehabilitative and Restorative Service Providers"

## 2021-04-16 ENCOUNTER — Encounter: Payer: Self-pay | Admitting: Rehabilitative and Restorative Service Providers"

## 2021-04-16 ENCOUNTER — Other Ambulatory Visit: Payer: Self-pay

## 2021-04-16 DIAGNOSIS — R2689 Other abnormalities of gait and mobility: Secondary | ICD-10-CM | POA: Diagnosis not present

## 2021-04-16 DIAGNOSIS — M6281 Muscle weakness (generalized): Secondary | ICD-10-CM | POA: Diagnosis not present

## 2021-04-16 DIAGNOSIS — M62830 Muscle spasm of back: Secondary | ICD-10-CM | POA: Insufficient documentation

## 2021-04-16 DIAGNOSIS — M5136 Other intervertebral disc degeneration, lumbar region: Secondary | ICD-10-CM | POA: Diagnosis not present

## 2021-04-16 DIAGNOSIS — M545 Low back pain, unspecified: Secondary | ICD-10-CM | POA: Diagnosis not present

## 2021-04-16 DIAGNOSIS — G8929 Other chronic pain: Secondary | ICD-10-CM | POA: Diagnosis not present

## 2021-04-16 DIAGNOSIS — R262 Difficulty in walking, not elsewhere classified: Secondary | ICD-10-CM

## 2021-04-16 NOTE — Patient Instructions (Signed)
Access Code: [email or web address] URL: [email or web address] Date: 04/16/2021 Prepared by: Juel Burrow  Exercises Standing March with Counter Support - 1 x daily - 7 x weekly - 2 sets - 10 reps Standing Hip Abduction with Counter Support - 1 x daily - 7 x weekly - 2 sets - 10 reps Standing Hip Extension with Counter Support - 1 x daily - 7 x weekly - 2 sets - 10 reps Mini Squat with Counter Support - 1 x daily - 7 x weekly - 2 sets - 10 reps

## 2021-04-16 NOTE — Therapy (Signed)
[place]. [place], [place], [address] Phone: [phone]   Fax:  [phone]  Physical Therapy Evaluation  Patient Details  Name: Stephen Holland MRN: [id number] Date of Birth: 18-Sep-1951 Referring Provider (PT): Liane Comber, NP   Encounter Date: 04/16/2021   PT End of Session - 04/16/21 0936    Visit Number 1    Date for PT Re-Evaluation 07/03/21    Authorization Type UHC Medicare    PT Start Time 4580    PT Stop Time 0924    PT Time Calculation (min) 40 min    Activity Tolerance Patient tolerated treatment well;Patient limited by fatigue    Behavior During Therapy [facility] for tasks assessed/performed           Past Medical History:  Diagnosis Date  . AKI (acute kidney injury) ([place]) 03/01/2021  . Colon polyps   . DJD (degenerative joint disease)   . ED (erectile dysfunction)   . Gout   . Hyperlipemia   . Hypertension   . Obstructive sleep apnea    08/2012, cpap setting of 14  . Prostate cancer ([place]) 01/2002   s/p prostatectomy  . Vitamin D deficiency     Past Surgical History:  Procedure Laterality Date  . CATARACT EXTRACTION, BILATERAL Bilateral 12/2020   Dr. Tommy Rainwater  . COLONOSCOPY WITH PROPOFOL N/A 09/06/2016   Procedure: COLONOSCOPY WITH PROPOFOL;  Surgeon: Garlan Fair, MD;  Location: [facility];  Service: Endoscopy;  Laterality: N/A;  . colonscopy     x 3 with polyp one time  . PROSTATECTOMY  2003  . SKIN CANCER EXCISION      There were no vitals filed for this visit.    Subjective Assessment - 04/16/21 0845    Subjective Pt states that he has been having back pain for approx 10 years.  He states that he was following a neurosurgeon and has had approx 3 spinal injections that did not help.  Patient declined to have back surgery due to being a poor candidate currently and he wanted to try PT first.  Pt has a diagnosis of DDD, he states approx 3 discs are involved.    Pertinent History  DDD    Limitations Walking    How long can you walk comfortably? 100 yards    Diagnostic tests MRIs    Patient Stated Goals Pt would like to be able to deer hunt more easily.    Currently in Pain? Yes    Pain Score 5     Pain Location Back    Pain Orientation Lower    Pain Descriptors / Indicators Aching    Pain Type Chronic pain    Pain Radiating Towards radiates to hips if he walks too long    Pain Onset More than a month ago    Pain Frequency Intermittent    Aggravating Factors  increased walking    Pain Relieving Factors sitting              [facility] PT Assessment - 04/16/21 0001      Assessment   Medical Diagnosis DDD    Referring Provider (PT) Liane Comber, NP    Hand Dominance Right    Next MD Visit 06/25/2021    Prior Therapy Yes, many years ago      Precautions   Precautions None      Restrictions   Weight Bearing Restrictions No      Balance Screen   Has  the patient fallen in the past 6 months No    Has the patient had a decrease in activity level because of a fear of falling?  No    Is the patient reluctant to leave their home because of a fear of falling?  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    Leisure deer hunting      Cognition   Overall Cognitive Status Within Functional Limits for tasks assessed      Observation/Other Assessments   Focus on Therapeutic Outcomes (FOTO)  51%, Risk Adjusted 48%      ROM / Strength   AROM / PROM / Strength AROM;Strength      AROM   Overall AROM  Within functional limits  for tasks performed      Strength   Overall Strength Deficits    Overall Strength Comments Bilat hip strength of 4+/5, back strength of 4/5      Palpation   Palpation comment increased muscle tightness noted over lumbar paraspinals      Transfers   Five time sit to stand comments  5.88      Standardized Balance Assessment   Standardized Balance Assessment Berg Balance Test      Berg Balance Test   Sit to Stand Able to stand without using hands and stabilize independently    Standing Unsupported Able to stand safely 2 minutes    Sitting with Back Unsupported but Feet Supported on Floor or Stool Able to sit safely and securely 2 minutes    Stand to Sit Sits safely with minimal use of hands    Transfers Able to transfer safely, minor use of hands    Standing Unsupported with Eyes Closed Able to stand 10 seconds safely    Standing Unsupported with Feet Together Able to place feet together independently and stand 1 minute safely    From Standing, Reach Forward with Outstretched Arm Can reach forward >12 cm safely (5")    From Standing Position, Pick up Object from Floor Able to pick up shoe safely and easily    From Standing Position, Turn to Look Behind Over each Shoulder Looks behind from both sides and weight shifts well    Turn 360 Degrees Able to turn 360 degrees safely in 4 seconds or less    Standing Unsupported, Alternately Place Feet on Step/Stool Able to stand independently and safely and complete 8 steps in 20 seconds    Standing Unsupported, One Foot in Front Able to take small step independently and hold 30 seconds    Standing on One Leg Tries to lift leg/unable to hold 3 seconds but remains standing independently    Total Score 50                      Objective measurements completed on examination: See above findings.       [facility] Adult PT Treatment/Exercise - 04/16/21 0001      Exercises  Exercises Lumbar      Lumbar Exercises: Standing   Functional  Squats 10 reps    Other Standing Lumbar Exercises BLE x10 reps each:  high marching, hip abduction, hip extension                  PT Education - 04/16/21 0917    Person(s) Educated Patient    Methods Explanation;Demonstration;Handout    Comprehension Verbalized understanding;Returned demonstration            PT Short Term Goals - 04/16/21 1046      PT SHORT TERM GOAL #1   Title Patient will be independent with initial HEP.    Time 2    Status New             PT Long Term Goals - 04/16/21 1046      PT LONG TERM GOAL #1   Title Patient will be independent with advanced HEP.    Time 12    Period Weeks    Status New      PT LONG TERM GOAL #2   Title Patient will be able to walk for 20 minutes with pain no greater than 2/10 to allow him to go hunting.    Time 12    Period Weeks    Status New      PT LONG TERM GOAL #3   Title Patient will increase back and hip strength to at least 5/5 to allow him to perform yard work.    Time 12    Period Weeks    Status New      PT LONG TERM GOAL #4   Title Patient will be able to perform SLS on either leg for at least 30 seconds to decrease his risk of falling.    Time 12    Period Weeks    Status New                  Plan - 04/16/21 1035    Clinical Impression Statement Patient presents with a referral from Liane Comber, NP secondary to DDD and low back pain.  He has approx a 10 year Hx of back pain with treatment previously, but was not very successful.  He states that he does not want surgery at this time due to his age and the recovery process and is hoping to have some relief with PT.  Patient presents with increased pain, muscle spasms/tightness, decreased core stability/weakness, and decreased balance.  He has most difficulty with single leg stance and tandem on BERG.  He would benefit from skilled PT to address his functional impairments to allow him to be safer and more independent and to be able to walk  to the deer stand easier.    Personal Factors and Comorbidities Age    Examination-Activity Limitations Squat;Lift;Stairs;Locomotion Level    Examination-Participation Restrictions Community Activity;Yard Work    Merchant navy officer Evolving/Moderate complexity    Clinical Decision Making Moderate    Rehab Potential Good    PT Frequency 2x / week    PT Duration 12 weeks    PT Treatment/Interventions ADLs/Self Care Home Management;Cryotherapy;Electrical Stimulation;Iontophoresis 4mg /ml Dexamethasone;Moist Heat;Traction;Ultrasound;Gait training;Stair training;Functional mobility training;Therapeutic activities;Therapeutic exercise;Balance training;Neuromuscular re-education;Patient/family education;Manual techniques;Dry needling;Spinal Manipulations;Joint Manipulations    PT Next Visit Plan strengthening, conditioning, balance    PT Home Exercise Plan Access Code: [email or web address]    Consulted and Agree with Plan of Care Patient           Patient will  benefit from skilled therapeutic intervention in order to improve the following deficits and impairments:  Decreased balance,Increased muscle spasms,Difficulty walking,Decreased activity tolerance,Decreased strength,Postural dysfunction,Pain  Visit Diagnosis: DDD (degenerative disc disease), lumbar - Plan: PT plan of care cert/re-cert  Chronic bilateral low back pain without sciatica - Plan: PT plan of care cert/re-cert  Balance problem - Plan: PT plan of care cert/re-cert  Difficulty in walking, not elsewhere classified - Plan: PT plan of care cert/re-cert  Muscle weakness (generalized) - Plan: PT plan of care cert/re-cert  Muscle spasm of back - Plan: PT plan of care cert/re-cert     Problem List Patient Active Problem List   Diagnosis Date Noted  . CKD (chronic kidney disease), stage III ([place]) 03/03/2021  . Septic olecranon bursitis of left elbow   . Cellulitis of left upper extremity 03/01/2021  . Chronic intermittent  hypoxia with obstructive sleep apnea 02/10/2021  . PLMD (periodic limb movement disorder) 01/08/2021  . CPAP (continuous positive airway pressure) dependence 01/08/2021  . Neuropathy 01/08/2021  . Coronary artery calcification seen on CT scan 03/19/2020  . Persistent proteinuria 02/22/2020  . Aortic atherosclerosis ([place]) by Chest CT on 12.18/2020 06/30/2017  . COPD (chronic obstructive pulmonary disease) ([place]) 06/13/2017  . Morbid obesity ([place]) 08/26/2014  . Former smoker (45+ pack year history, quit 02/2021) 08/26/2014  . Essential hypertension   . Hyperlipidemia, mixed   . Abnormal glucose   . OSA and COPD overlap syndrome ([place])   . Vitamin D deficiency   . DJD (degenerative joint disease)   . Primary gout   . ED (erectile dysfunction)     Juel Burrow, PT, DPT 04/16/2021, 10:52 AM  [place]. [place], [place], [address] Phone: [phone]   Fax:  [phone]  Name: Stephen Holland MRN: [id number] Date of Birth: 16-Mar-1951

## 2021-04-22 ENCOUNTER — Ambulatory Visit
Admission: RE | Admit: 2021-04-22 | Discharge: 2021-04-22 | Disposition: A | Discharge status: Home / Self Care | Payer: Medicare Other | Source: Ambulatory Visit | Attending: Adult Health | Admitting: Adult Health

## 2021-04-22 ENCOUNTER — Other Ambulatory Visit: Payer: Self-pay

## 2021-04-22 DIAGNOSIS — F17210 Nicotine dependence, cigarettes, uncomplicated: Secondary | ICD-10-CM

## 2021-04-22 DIAGNOSIS — Z87891 Personal history of nicotine dependence: Secondary | ICD-10-CM | POA: Diagnosis not present

## 2021-04-23 DIAGNOSIS — G4733 Obstructive sleep apnea (adult) (pediatric): Secondary | ICD-10-CM | POA: Diagnosis not present

## 2021-04-24 ENCOUNTER — Encounter: Payer: Self-pay | Admitting: Physical Therapy

## 2021-04-24 ENCOUNTER — Other Ambulatory Visit: Payer: Self-pay

## 2021-04-24 ENCOUNTER — Encounter: Payer: Self-pay | Admitting: Adult Health

## 2021-04-24 ENCOUNTER — Ambulatory Visit: Payer: Medicare Other | Admitting: Physical Therapy

## 2021-04-24 ENCOUNTER — Other Ambulatory Visit: Payer: Self-pay | Admitting: Adult Health

## 2021-04-24 DIAGNOSIS — R932 Abnormal findings on diagnostic imaging of liver and biliary tract: Secondary | ICD-10-CM

## 2021-04-24 DIAGNOSIS — M62830 Muscle spasm of back: Secondary | ICD-10-CM | POA: Diagnosis not present

## 2021-04-24 DIAGNOSIS — M5136 Other intervertebral disc degeneration, lumbar region: Secondary | ICD-10-CM | POA: Diagnosis not present

## 2021-04-24 DIAGNOSIS — R262 Difficulty in walking, not elsewhere classified: Secondary | ICD-10-CM

## 2021-04-24 DIAGNOSIS — G8929 Other chronic pain: Secondary | ICD-10-CM | POA: Diagnosis not present

## 2021-04-24 DIAGNOSIS — R2689 Other abnormalities of gait and mobility: Secondary | ICD-10-CM

## 2021-04-24 DIAGNOSIS — M6281 Muscle weakness (generalized): Secondary | ICD-10-CM

## 2021-04-24 DIAGNOSIS — M545 Low back pain, unspecified: Secondary | ICD-10-CM | POA: Diagnosis not present

## 2021-04-24 NOTE — Therapy (Signed)
[place]. [place], [place], [address] Phone: [phone]   Fax:  [phone]  Physical Therapy Treatment  Patient Details  Name: Stephen Holland MRN: [id number] Date of Birth: 1951-06-05 Referring Provider (PT): Liane Comber, NP   Encounter Date: 04/24/2021   PT End of Session - 04/24/21 1052    Visit Number 2    Date for PT Re-Evaluation 07/03/21    PT Start Time 1010    PT Stop Time 1052    PT Time Calculation (min) 42 min    Activity Tolerance Patient tolerated treatment well;Patient limited by fatigue    Behavior During Therapy [facility] for tasks assessed/performed           Past Medical History:  Diagnosis Date  . AKI (acute kidney injury) ([place]) 03/01/2021  . Colon polyps   . DJD (degenerative joint disease)   . ED (erectile dysfunction)   . Gout   . Hyperlipemia   . Hypertension   . Obstructive sleep apnea    08/2012, cpap setting of 14  . Prostate cancer ([place]) 01/2002   s/p prostatectomy  . Vitamin D deficiency     Past Surgical History:  Procedure Laterality Date  . CATARACT EXTRACTION, BILATERAL Bilateral 12/2020   Dr. Tommy Rainwater  . COLONOSCOPY WITH PROPOFOL N/A 09/06/2016   Procedure: COLONOSCOPY WITH PROPOFOL;  Surgeon: Garlan Fair, MD;  [facility];  Service: Endoscopy;  Laterality: N/A;  . colonscopy     x 3 with polyp one time  . PROSTATECTOMY  2003  . SKIN CANCER EXCISION      There were no vitals filed for this visit.   Subjective Assessment - 04/24/21 1007    Subjective Reports taht Monday he had a lot of back pain. Worse with standing and walking    Currently in Pain? Yes    Pain Score 6     Pain Location Back    Pain Orientation Lower    Pain Descriptors / Indicators Aching;Sore    Effect of Pain on Daily Activities had to sit down and go to bed on Monday due to pain                             [facility] Adult PT Treatment/Exercise - 04/24/21 0001       Lumbar Exercises: Stretches   Passive Hamstring Stretch Right;Left;4 reps;20 seconds    Lower Trunk Rotation 10 seconds;4 reps    Piriformis Stretch 4 reps;20 seconds      Lumbar Exercises: Aerobic   Nustep level 5 x 5 minutes      Lumbar Exercises: Machines for Strengthening   Other Lumbar Machine Exercise 20# rows and lats, 15# AR press    Other Lumbar Machine Exercise 10# straight arm pulls 2x10      Lumbar Exercises: Supine   Bridge with clamshell 20 reps;1 second    Other Supine Lumbar Exercises feet on ball K2C, trunk rotation, small bridges and isometric abs                    PT Short Term Goals - 04/24/21 1053      PT SHORT TERM GOAL #1   Title Patient will be independent with initial HEP.    Status Partially Met             PT Long Term Goals - 04/16/21 1046  PT LONG TERM GOAL #1   Title Patient will be independent with advanced HEP.    Time 12    Period Weeks    Status New      PT LONG TERM GOAL #2   Title Patient will be able to walk for 20 minutes with pain no greater than 2/10 to allow him to go hunting.    Time 12    Period Weeks    Status New      PT LONG TERM GOAL #3   Title Patient will increase back and hip strength to at least 5/5 to allow him to perform yard work.    Time 12    Period Weeks    Status New      PT LONG TERM GOAL #4   Title Patient will be able to perform SLS on either leg for at least 30 seconds to decrease his risk of falling.    Time 12    Period Weeks    Status New                 Plan - 04/24/21 1052    Clinical Impression Statement initiated core stability and LE felxibility to help decrease stress ont he low back, he tolerated the activities, some shortness of breath, very tight HS, cues for form on the exercises are needed    PT Next Visit Plan strengthening, conditioning, balance    Consulted and Agree with Plan of Care Patient           Patient will benefit from skilled  therapeutic intervention in order to improve the following deficits and impairments:  Decreased balance,Increased muscle spasms,Difficulty walking,Decreased activity tolerance,Decreased strength,Postural dysfunction,Pain  Visit Diagnosis: DDD (degenerative disc disease), lumbar  Chronic bilateral low back pain without sciatica  Balance problem  Difficulty in walking, not elsewhere classified  Muscle weakness (generalized)  Muscle spasm of back     Problem List Patient Active Problem List   Diagnosis Date Noted  . Abnormal CT of liver 04/24/2021  . CKD (chronic kidney disease), stage III ([place]) 03/03/2021  . Septic olecranon bursitis of left elbow   . Chronic intermittent hypoxia with obstructive sleep apnea 02/10/2021  . PLMD (periodic limb movement disorder) 01/08/2021  . CPAP (continuous positive airway pressure) dependence 01/08/2021  . Neuropathy 01/08/2021  . Coronary artery calcification seen on CT scan 03/19/2020  . Persistent proteinuria 02/22/2020  . Aortic atherosclerosis ([place]) by Chest CT on 12.18/2020 06/30/2017  . COPD (chronic obstructive pulmonary disease) ([place]) 06/13/2017  . Morbid obesity ([place]) 08/26/2014  . Former smoker (45+ pack year history, quit 02/2021) 08/26/2014  . Essential hypertension   . Hyperlipidemia, mixed   . Abnormal glucose   . OSA and COPD overlap syndrome ([place])   . Vitamin D deficiency   . DJD (degenerative joint disease)   . Primary gout   . ED (erectile dysfunction)     Sumner Boast., PT 04/24/2021, 10:53 AM  [place]. [place], [place], [address] Phone: [phone]   Fax:  [phone]  Name: Stephen Holland MRN: [id number] Date of Birth: 1951-05-11

## 2021-05-01 ENCOUNTER — Ambulatory Visit: Payer: Medicare Other | Attending: Adult Health | Admitting: Rehabilitative and Restorative Service Providers"

## 2021-05-01 ENCOUNTER — Encounter: Payer: Self-pay | Admitting: Rehabilitative and Restorative Service Providers"

## 2021-05-01 ENCOUNTER — Other Ambulatory Visit: Payer: Self-pay

## 2021-05-01 DIAGNOSIS — R262 Difficulty in walking, not elsewhere classified: Secondary | ICD-10-CM | POA: Diagnosis not present

## 2021-05-01 DIAGNOSIS — M545 Low back pain, unspecified: Secondary | ICD-10-CM | POA: Diagnosis not present

## 2021-05-01 DIAGNOSIS — R2689 Other abnormalities of gait and mobility: Secondary | ICD-10-CM | POA: Diagnosis not present

## 2021-05-01 DIAGNOSIS — G8929 Other chronic pain: Secondary | ICD-10-CM | POA: Insufficient documentation

## 2021-05-01 DIAGNOSIS — M62830 Muscle spasm of back: Secondary | ICD-10-CM | POA: Insufficient documentation

## 2021-05-01 DIAGNOSIS — M5136 Other intervertebral disc degeneration, lumbar region: Secondary | ICD-10-CM | POA: Insufficient documentation

## 2021-05-01 DIAGNOSIS — M6281 Muscle weakness (generalized): Secondary | ICD-10-CM

## 2021-05-01 NOTE — Therapy (Signed)
[place]. [place], [place], [address] Phone: [phone]   Fax:  [phone]  Physical Therapy Treatment  Patient Details  Name: Stephen Holland MRN: [id number] Date of Birth: 07/22/1951 Referring Provider (PT): Liane Comber, NP   Encounter Date: 05/01/2021   PT End of Session - 05/01/21 0803    Visit Number 3    Date for PT Re-Evaluation 07/03/21    Authorization Type UHC Medicare    PT Start Time 2536    PT Stop Time 0835    PT Time Calculation (min) 40 min    Activity Tolerance Patient tolerated treatment well;Patient limited by fatigue    Behavior During Therapy [facility] for tasks assessed/performed           Past Medical History:  Diagnosis Date  . AKI (acute kidney injury) ([place]) 03/01/2021  . Colon polyps   . DJD (degenerative joint disease)   . ED (erectile dysfunction)   . Gout   . Hyperlipemia   . Hypertension   . Obstructive sleep apnea    08/2012, cpap setting of 14  . Prostate cancer ([place]) 01/2002   s/p prostatectomy  . Vitamin D deficiency     Past Surgical History:  Procedure Laterality Date  . CATARACT EXTRACTION, BILATERAL Bilateral 12/2020   Dr. Tommy Rainwater  . COLONOSCOPY WITH PROPOFOL N/A 09/06/2016   Procedure: COLONOSCOPY WITH PROPOFOL;  Surgeon: Garlan Fair, MD;  [facility];  Service: Endoscopy;  Laterality: N/A;  . colonscopy     x 3 with polyp one time  . PROSTATECTOMY  2003  . SKIN CANCER EXCISION      There were no vitals filed for this visit.   Subjective Assessment - 05/01/21 0759    Subjective I am doing okay today, but on Wednesday I was in a lot of pain.  The weed eater liked to have killed me.    Currently in Pain? Yes    Pain Score 5     Pain Location Back    Pain Orientation Lower    Pain Descriptors / Indicators Aching;Sore                             [facility] Adult PT Treatment/Exercise - 05/01/21 0001      Lumbar Exercises:  Stretches   Active Hamstring Stretch Right;Left;3 reps;20 seconds    Active Hamstring Stretch Limitations 90/90 supine position    Lower Trunk Rotation 5 reps;10 seconds    Piriformis Stretch Right;Left;2 reps;20 seconds      Lumbar Exercises: Aerobic   Nustep level 5 x 6 minutes      Lumbar Exercises: Machines for Strengthening   Other Lumbar Machine Exercise 20# rows and lats, 15# AR press.  2x10 each    Other Lumbar Machine Exercise 10# straight arm pulls 2x10      Lumbar Exercises: Standing   Other Standing Lumbar Exercises Resisted walking, 30# 4 way x4 each      Lumbar Exercises: Supine   Bridge with clamshell 20 reps;1 second    Bridge with [employer] Limitations red Tband    Other Supine Lumbar Exercises feet on ball x10 each: K2C, trunk rotation, small bridges and isometric abs      Manual Therapy   Manual Therapy Soft tissue mobilization;Myofascial release    Soft tissue mobilization to lumbar region, specifically L side    Myofascial Release trigger point release to  L paraspinals                    PT Short Term Goals - 05/01/21 0846      PT SHORT TERM GOAL #1   Title Patient will be independent with initial HEP.    Status Partially Met             PT Long Term Goals - 05/01/21 0846      PT LONG TERM GOAL #1   Title Patient will be independent with advanced HEP.    Status On-going      PT LONG TERM GOAL #2   Title Patient will be able to walk for 20 minutes with pain no greater than 2/10 to allow him to go hunting.    Status On-going      PT LONG TERM GOAL #3   Title Patient will increase back and hip strength to at least 5/5 to allow him to perform yard work.    Status On-going      PT LONG TERM GOAL #4   Title Patient will be able to perform SLS on either leg for at least 30 seconds to decrease his risk of falling.    Status On-going                 Plan - 05/01/21 0842    Clinical Impression Statement Patient continues to  progress towards goal related activities.  He continues to be limited with core stability and ROM.  He did report some hip soreness following resisted walking, but it went away quickly when activity was stopped.  He continues to have dyspnea with exercises, but is able to complete all activities asked of him.    PT Treatment/Interventions ADLs/Self Care Home Management;Cryotherapy;Electrical Stimulation;Iontophoresis 4mg /ml Dexamethasone;Moist Heat;Traction;Ultrasound;Gait training;Stair training;Functional mobility training;Therapeutic activities;Therapeutic exercise;Balance training;Neuromuscular re-education;Patient/family education;Manual techniques;Dry needling;Spinal Manipulations;Joint Manipulations    PT Next Visit Plan strengthening, conditioning, balance    Consulted and Agree with Plan of Care Patient           Patient will benefit from skilled therapeutic intervention in order to improve the following deficits and impairments:  Decreased balance,Increased muscle spasms,Difficulty walking,Decreased activity tolerance,Decreased strength,Postural dysfunction,Pain  Visit Diagnosis: DDD (degenerative disc disease), lumbar  Chronic bilateral low back pain without sciatica  Balance problem  Difficulty in walking, not elsewhere classified  Muscle weakness (generalized)  Muscle spasm of back     Problem List Patient Active Problem List   Diagnosis Date Noted  . Abnormal CT of liver 04/24/2021  . CKD (chronic kidney disease), stage III ([place]) 03/03/2021  . Septic olecranon bursitis of left elbow   . Chronic intermittent hypoxia with obstructive sleep apnea 02/10/2021  . PLMD (periodic limb movement disorder) 01/08/2021  . CPAP (continuous positive airway pressure) dependence 01/08/2021  . Neuropathy 01/08/2021  . Coronary artery calcification seen on CT scan 03/19/2020  . Persistent proteinuria 02/22/2020  . Aortic atherosclerosis ([place]) by Chest CT on 12.18/2020 06/30/2017  .  COPD (chronic obstructive pulmonary disease) ([place]) 06/13/2017  . Morbid obesity ([place]) 08/26/2014  . Former smoker (45+ pack year history, quit 02/2021) 08/26/2014  . Essential hypertension   . Hyperlipidemia, mixed   . Abnormal glucose   . OSA and COPD overlap syndrome ([place])   . Vitamin D deficiency   . DJD (degenerative joint disease)   . Primary gout   . ED (erectile dysfunction)     Juel Burrow, PT, DPT 05/01/2021, 8:48 AM  [facility]  [place]. [place], [place], [address] Phone: [phone]   Fax:  [phone]  Name: Stephen Holland MRN: [id number] Date of Birth: [birth year]

## 2021-05-06 ENCOUNTER — Other Ambulatory Visit: Payer: Self-pay

## 2021-05-06 ENCOUNTER — Ambulatory Visit: Payer: Medicare Other | Admitting: Rehabilitative and Restorative Service Providers"

## 2021-05-06 ENCOUNTER — Encounter: Payer: Self-pay | Admitting: Rehabilitative and Restorative Service Providers"

## 2021-05-06 DIAGNOSIS — M6281 Muscle weakness (generalized): Secondary | ICD-10-CM | POA: Diagnosis not present

## 2021-05-06 DIAGNOSIS — M5136 Other intervertebral disc degeneration, lumbar region: Secondary | ICD-10-CM | POA: Diagnosis not present

## 2021-05-06 DIAGNOSIS — M545 Low back pain, unspecified: Secondary | ICD-10-CM

## 2021-05-06 DIAGNOSIS — R2689 Other abnormalities of gait and mobility: Secondary | ICD-10-CM | POA: Diagnosis not present

## 2021-05-06 DIAGNOSIS — M62830 Muscle spasm of back: Secondary | ICD-10-CM

## 2021-05-06 DIAGNOSIS — G8929 Other chronic pain: Secondary | ICD-10-CM

## 2021-05-06 DIAGNOSIS — R262 Difficulty in walking, not elsewhere classified: Secondary | ICD-10-CM | POA: Diagnosis not present

## 2021-05-06 NOTE — Therapy (Signed)
[facility] [place]. [place], [place], [address] Phone: [phone]   Fax:  [phone]  Physical Therapy Treatment  Patient Details  Name: Stephen Holland MRN: [id number] Date of Birth: 05-26-1951 Referring Provider (PT): Liane Comber, NP   Encounter Date: 05/06/2021   PT End of Session - 05/06/21 1102    Visit Number 4    Date for PT Re-Evaluation 07/03/21    Authorization Type [employer] Medicare    PT Start Time 1100    PT Stop Time 1142    PT Time Calculation (min) 42 min    Activity Tolerance Patient tolerated treatment well    Behavior During Therapy [facility] for tasks assessed/performed           Past Medical History:  Diagnosis Date  . AKI (acute kidney injury) ([place]) 03/01/2021  . Colon polyps   . DJD (degenerative joint disease)   . ED (erectile dysfunction)   . Gout   . Hyperlipemia   . Hypertension   . Obstructive sleep apnea    08/2012, cpap setting of 14  . Prostate cancer ([place]) 01/2002   s/p prostatectomy  . Vitamin D deficiency     Past Surgical History:  Procedure Laterality Date  . CATARACT EXTRACTION, BILATERAL Bilateral 12/2020   Dr. Tommy Rainwater  . COLONOSCOPY WITH PROPOFOL N/A 09/06/2016   Procedure: COLONOSCOPY WITH PROPOFOL;  Surgeon: Garlan Fair, MD;  Location: [facility];  Service: Endoscopy;  Laterality: N/A;  . colonscopy     x 3 with polyp one time  . PROSTATECTOMY  2003  . SKIN CANCER EXCISION      There were no vitals filed for this visit.   Subjective Assessment - 05/06/21 1101    Subjective I am feeling better. I got a good workout on Saturday, I was helping my daughter with her privacy fence.  I also started using my inversion table.    Pertinent History DDD    Patient Stated Goals Pt would like to be able to deer hunt more easily.    Currently in Pain? Yes    Pain Score 1     Pain Location Back    Pain Orientation Lower    Pain Descriptors / Indicators Aching                              [facility] Adult PT Treatment/Exercise - 05/06/21 0001      Lumbar Exercises: Stretches   Active Hamstring Stretch Right;Left;3 reps;20 seconds    Active Hamstring Stretch Limitations 90/90 supine position    Lower Trunk Rotation 5 reps;10 seconds      Lumbar Exercises: Aerobic   Nustep level 5 x 6 minutes      Lumbar Exercises: Machines for Strengthening   Leg Press 30# 2x10    Other Lumbar Machine Exercise 20# rows and lats, 15# AR press.  2x10 each    Other Lumbar Machine Exercise 10# straight arm pulls 2x10      Lumbar Exercises: Standing   Other Standing Lumbar Exercises Resisted walking, 30# 4 way x4 each      Lumbar Exercises: Seated   Sit to Stand 20 reps   x10 with chest press with yellow ball, x10 with overhead press with yellow ball     Lumbar Exercises: Supine   Bridge with clamshell 20 reps;4 seconds    Bridge with [employer] Limitations red Tband  Other Supine Lumbar Exercises feet on ball x10 each: K2C, trunk rotation, small bridges and isometric abs                    PT Short Term Goals - 05/06/21 1154      PT SHORT TERM GOAL #1   Title Patient will be independent with initial HEP.    Status Achieved             PT Long Term Goals - 05/06/21 1154      PT LONG TERM GOAL #1   Title Patient will be independent with advanced HEP.    Status On-going      PT LONG TERM GOAL #2   Title Patient will be able to walk for 20 minutes with pain no greater than 2/10 to allow him to go hunting.    Status Partially Met      PT LONG TERM GOAL #3   Title Patient will increase back and hip strength to at least 5/5 to allow him to perform yard work.    Status Partially Met      PT LONG TERM GOAL #4   Title Patient will be able to perform SLS on either leg for at least 30 seconds to decrease his risk of falling.    Status On-going                 Plan - 05/06/21 1151    Clinical Impression Statement Patient  is progressing with goal related activities.  He continues to improve with strength and core stability.  He was able to tolerate increased exercises today and the addition of the sit to stand with chest press and overhead press with weighted ball.  He had less dyspnea noted today during session.  He states that he will see how he is feeling after next session and decide if he believes that he continue HEP or continue with skiled PT due to his copay.    PT Treatment/Interventions ADLs/Self Care Home Management;Cryotherapy;Electrical Stimulation;Iontophoresis 4mg /ml Dexamethasone;Moist Heat;Traction;Ultrasound;Gait training;Stair training;Functional mobility training;Therapeutic activities;Therapeutic exercise;Balance training;Neuromuscular re-education;Patient/family education;Manual techniques;Dry needling;Spinal Manipulations;Joint Manipulations    PT Next Visit Plan strengthening, conditioning, balance    Consulted and Agree with Plan of Care Patient           Patient will benefit from skilled therapeutic intervention in order to improve the following deficits and impairments:  Decreased balance,Increased muscle spasms,Difficulty walking,Decreased activity tolerance,Decreased strength,Postural dysfunction,Pain  Visit Diagnosis: DDD (degenerative disc disease), lumbar  Chronic bilateral low back pain without sciatica  Balance problem  Difficulty in walking, not elsewhere classified  Muscle weakness (generalized)  Muscle spasm of back     Problem List Patient Active Problem List   Diagnosis Date Noted  . Abnormal CT of liver 04/24/2021  . CKD (chronic kidney disease), stage III ([place]) 03/03/2021  . Septic olecranon bursitis of left elbow   . Chronic intermittent hypoxia with obstructive sleep apnea 02/10/2021  . PLMD (periodic limb movement disorder) 01/08/2021  . CPAP (continuous positive airway pressure) dependence 01/08/2021  . Neuropathy 01/08/2021  . Coronary artery  calcification seen on CT scan 03/19/2020  . Persistent proteinuria 02/22/2020  . Aortic atherosclerosis ([place]) by Chest CT on 12.18/2020 06/30/2017  . COPD (chronic obstructive pulmonary disease) ([place]) 06/13/2017  . Morbid obesity ([place]) 08/26/2014  . Former smoker (45+ pack year history, quit 02/2021) 08/26/2014  . Essential hypertension   . Hyperlipidemia, mixed   . Abnormal glucose   . OSA  and COPD overlap syndrome ([place])   . Vitamin D deficiency   . DJD (degenerative joint disease)   . Primary gout   . ED (erectile dysfunction)     Juel Burrow, PT, DPT 05/06/2021, 11:56 AM  [place]. [place], [place], [address] Phone: [phone]   Fax:  [phone]  Name: Stephen Holland MRN: [id number] Date of Birth: 1951/03/21

## 2021-05-10 ENCOUNTER — Other Ambulatory Visit: Payer: Self-pay | Admitting: Internal Medicine

## 2021-05-10 ENCOUNTER — Other Ambulatory Visit: Payer: Self-pay | Admitting: Adult Health

## 2021-05-13 ENCOUNTER — Ambulatory Visit: Payer: Medicare Other | Admitting: Rehabilitative and Restorative Service Providers"

## 2021-05-13 DIAGNOSIS — L905 Scar conditions and fibrosis of skin: Secondary | ICD-10-CM | POA: Diagnosis not present

## 2021-05-13 DIAGNOSIS — Z85828 Personal history of other malignant neoplasm of skin: Secondary | ICD-10-CM | POA: Diagnosis not present

## 2021-05-13 DIAGNOSIS — C44329 Squamous cell carcinoma of skin of other parts of face: Secondary | ICD-10-CM | POA: Diagnosis not present

## 2021-05-15 ENCOUNTER — Ambulatory Visit: Payer: Medicare Other | Admitting: Rehabilitative and Restorative Service Providers"

## 2021-05-22 ENCOUNTER — Telehealth: Payer: Self-pay | Admitting: Neurology

## 2021-05-22 NOTE — Telephone Encounter (Signed)
Pt called stating that he was informed that he would need to push out his cpap f/u to the beginning of August due to getting his cpap machine recently. I could not offer him an early August appointment and pt would like to know if RN can put him in around 10-10:30am time. Please advise.

## 2021-05-24 DIAGNOSIS — G4733 Obstructive sleep apnea (adult) (pediatric): Secondary | ICD-10-CM | POA: Diagnosis not present

## 2021-05-25 NOTE — Telephone Encounter (Signed)
Received the following message back from [place]: "He was setup on 05/26 and is using a [facility] machine. I have tagged [place] in [place]."

## 2021-05-25 NOTE — Telephone Encounter (Signed)
Called pt back. Advised since he started on his new machine 04/23/21 and his follow up is 06/04/21 at 10:30am, he is ok to keep current appt. He needs 31-90 days of data on machine for compliance/insurance purposes. 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 He will call beginning of August to get 90 day report to support for DOT physical. Nothing further needed at this time.

## 2021-05-25 NOTE — Telephone Encounter (Signed)
Sent community message to [facility] to get pt tagged into [facility]. Also asked for start date on new machine to be able to schedule follow up accordingly. Waiting on response.

## 2021-05-29 ENCOUNTER — Ambulatory Visit: Payer: Medicare Other | Admitting: Cardiovascular Disease

## 2021-05-29 ENCOUNTER — Other Ambulatory Visit: Payer: Self-pay

## 2021-05-29 ENCOUNTER — Encounter: Payer: Self-pay | Admitting: Cardiovascular Disease

## 2021-05-29 DIAGNOSIS — G4733 Obstructive sleep apnea (adult) (pediatric): Secondary | ICD-10-CM | POA: Diagnosis not present

## 2021-05-29 DIAGNOSIS — J449 Chronic obstructive pulmonary disease, unspecified: Secondary | ICD-10-CM | POA: Diagnosis not present

## 2021-05-29 DIAGNOSIS — E782 Mixed hyperlipidemia: Secondary | ICD-10-CM

## 2021-05-29 DIAGNOSIS — I10 Essential (primary) hypertension: Secondary | ICD-10-CM

## 2021-05-29 DIAGNOSIS — Z87891 Personal history of nicotine dependence: Secondary | ICD-10-CM

## 2021-05-29 DIAGNOSIS — I2510 Atherosclerotic heart disease of native coronary artery without angina pectoris: Secondary | ICD-10-CM | POA: Diagnosis not present

## 2021-05-29 NOTE — Progress Notes (Signed)
05/29/2021 Stephen Holland   [birth year]  [id number]  Primary Physician Stephen Pinto, MD Primary Cardiologist: Stephen Harp MD Stephen Holland, [place], [place]  HPI:  Stephen Holland is a 70 y.o.  morbidly overweight married Caucasian male father of 2, grandfather of 2 grandchildren referred by Dr. Melford Holland because of progressive dyspnea and lower extremity edema.  I last saw him in the office 04/16/2020.  His cardiac risk factor profile is notable for 50-75-pack-year tobacco abuse currently smoking a pack and a half a day.  He also drinks 2-3 hard drinks a night (bourbon).  He has treated hypertension hyperlipidemia.  There is no family history for heart disease.  Is never had a heart attack or stroke.  Just denies chest pain but has had progressive dyspnea over the 3 months prior to his last office visit as well as some edema.  Perform 2D echocardiography which is essentially normal except for grade 1 diastolic dysfunction.  Coronary CTA showed no evidence of obstructive disease although his coronary calcium score is measured at 302.  Does continue to smoke 1 pack/day.  He wears CPAP for obstructive sleep apnea.  He was hospitalized for several days for a left upper extremity infection.   Current Meds  Medication Sig   bisoprolol (ZEBETA) 10 MG tablet Take  1 tablet  Daily  for BP (Patient taking differently: Take 10 mg by mouth every morning. for BP)   Cholecalciferol (VITAMIN D3) 125 MCG (5000 UT) CAPS Take 10,000 Units by mouth every morning.   clonazePAM (KLONOPIN) 1 MG tablet Take  1/2 - 1 tablet  at Bedtime  ONLY  if needed forSleep &  limit to 5 days /week to avoid Addiction & Dementia (Patient taking differently: Take 1 mg by mouth at bedtime as needed (restless legs). limit to 5 days /week to avoid Addiction & Dementia)   gabapentin (NEURONTIN) 600 MG tablet TAKE 1/2 TO 1 TABLET 2 TO 3 TIMES DAILY AS NEEDED FOR NEUROPATHY PAIN (Patient taking differently: Take 600 mg by mouth 2 (two) times  daily. FOR NEUROPATHY PAIN)   Krill Oil 350 MG CAPS Take 350 mg by mouth every morning.   Magnesium 400 MG CAPS Take 800 mg by mouth every morning.   meloxicam (MOBIC) 15 MG tablet TAKE 1 TABLET BY MOUTH DAILY AS NEEDED WITH FOOD FOR PAIN & INFLAMMATION (Patient taking differently: Take 15 mg by mouth every morning. FOR PAIN & INFLAMMATION)   PRESCRIPTION MEDICATION Inhale into the lungs at bedtime. CPAP   rosuvastatin (CRESTOR) 40 MG tablet TAKE 1 TABLET BY MOUTH EVERY DAY FOR CHOLESTEROL (Patient taking differently: Take 40 mg by mouth every morning. FOR CHOLESTEROL)   telmisartan (MICARDIS) 80 MG tablet TAKE 1 TABLET DAILY FOR BLOOD PRESSURE (Patient taking differently: Take 80 mg by mouth every morning. FOR BLOOD PRESSURE)   [DISCONTINUED] doxycycline (VIBRA-TABS) 100 MG tablet Take 1 tablet (100 mg total) by mouth 2 (two) times daily.     No Known Allergies  Social History   Socioeconomic History   Marital status: Married    Spouse name: Stephen Holland   Number of children: 2   Years of education: 12   Highest education level: Not on file  Occupational History    Comment: truck driver  Tobacco Use   Smoking status: Former    Packs/day: 1.00    Years: 45.00    Pack years: 45.00    Types: Cigarettes    Quit date: 03/01/2021  Years since quitting: 0.2   Smokeless tobacco: Never  Vaping Use   Vaping Use: Never used  Substance and Sexual Activity   Alcohol use: Yes    Alcohol/week: 15.0 standard drinks    Types: 15 Standard drinks or equivalent per week    Comment: 03/31/20  2-3 mixed drinks per day   Drug use: No   Sexual activity: Not on file  Other Topics Concern   Not on file  Social History Narrative   Lives home with wife. Retired.  Education HS.  Children 2     Caffeine 4 cups daily and one coke.   Social Determinants of Health   Financial Resource Strain: Not on file  Food Insecurity: Not on file  Transportation Needs: Not on file  Physical Activity: Not on file   Stress: Not on file  Social Connections: Not on file  Intimate Partner Violence: Not on file     Review of Systems: General: negative for chills, fever, night sweats or weight changes.  Cardiovascular: negative for chest pain, dyspnea on exertion, edema, orthopnea, palpitations, paroxysmal nocturnal dyspnea or shortness of breath Dermatological: negative for rash Respiratory: negative for cough or wheezing Urologic: negative for hematuria Abdominal: negative for nausea, vomiting, diarrhea, bright red blood per rectum, melena, or hematemesis Neurologic: negative for visual changes, syncope, or dizziness All other systems reviewed and are otherwise negative except as noted above.    Blood pressure 130/77, pulse (!) 56, height 5\' 7"  (1.702 m), weight 205 lb 9.6 oz (93.3 kg), SpO2 94 %.  General appearance: alert and no distress Neck: no adenopathy, no carotid bruit, no JVD, supple, symmetrical, trachea midline, and thyroid not enlarged, symmetric, no tenderness/mass/nodules Lungs: clear to auscultation bilaterally Heart: regular rate and rhythm, S1, S2 normal, no murmur, click, rub or gallop Extremities: extremities normal, atraumatic, no cyanosis or edema Pulses: 2+ and symmetric Skin: Skin color, texture, turgor normal. No rashes or lesions Neurologic: Grossly normal  EKG not performed today  ASSESSMENT AND PLAN:   Essential hypertension History of essential hypertension a blood pressure measured today at 130/77.  He is on [place] and [place].  Hyperlipidemia, mixed History of hyperlipidemia on statin therapy with lipid profile profile performed 03/25/2021 revealing total cholesterol 111, LDL 46 and HDL 38.  OSA and COPD overlap syndrome ([place]) History of obstructive sleep apnea on CPAP  Former smoker (45+ pack year history, quit 02/2021) Ongoing tobacco abuse of 1 pack/day recalcitrant to risk factor modification.  Coronary artery calcification seen on CT scan History of  elevated coronary calcium score of 302 with CTA FFR performed 05/10/2020 revealing no evidence of obstructive disease.  He denies chest pain.     Stephen Harp MD [place], [facility] 05/29/2021 9:49 AM

## 2021-05-29 NOTE — Patient Instructions (Signed)

## 2021-05-29 NOTE — Assessment & Plan Note (Signed)
History of obstructive sleep apnea on CPAP. 

## 2021-05-29 NOTE — Assessment & Plan Note (Signed)
History of elevated coronary calcium score of 302 with CTA FFR performed 05/10/2020 revealing no evidence of obstructive disease.  He denies chest pain.

## 2021-05-29 NOTE — Assessment & Plan Note (Signed)
Ongoing tobacco abuse of 1 pack/day recalcitrant to risk factor modification.

## 2021-05-29 NOTE — Assessment & Plan Note (Signed)
History of hyperlipidemia on statin therapy with lipid profile profile performed 03/25/2021 revealing total cholesterol 111, LDL 46 and HDL 38.

## 2021-05-29 NOTE — Assessment & Plan Note (Signed)
History of essential hypertension a blood pressure measured today at 130/77.  He is on [place] and [place].

## 2021-06-04 ENCOUNTER — Ambulatory Visit: Payer: Medicare Other | Admitting: Adult Health

## 2021-06-04 ENCOUNTER — Encounter: Payer: Self-pay | Admitting: Adult Health

## 2021-06-04 ENCOUNTER — Other Ambulatory Visit: Payer: Self-pay

## 2021-06-04 VITALS — BP 173/80 | HR 64 | Ht 67.0 in | Wt 204.0 lb

## 2021-06-04 DIAGNOSIS — G4733 Obstructive sleep apnea (adult) (pediatric): Secondary | ICD-10-CM

## 2021-06-04 DIAGNOSIS — J449 Chronic obstructive pulmonary disease, unspecified: Secondary | ICD-10-CM | POA: Diagnosis not present

## 2021-06-04 NOTE — Patient Instructions (Signed)
Continue using CPAP nightly and greater than 4 hours each night °If your symptoms worsen or you develop new symptoms please let us know.  ° °

## 2021-06-04 NOTE — Progress Notes (Signed)
PATIENT: Stephen Holland DOB: January 14, 1951  REASON FOR VISIT: follow up HISTORY FROM: patient PRIMARY NEUROLOGIST: Dr. Brett Fairy  HISTORY OF PRESENT ILLNESS: Today 06/04/21:  Stephen Holland is a 70 year old male with a history of obstructive sleep apnea on CPAP.  He has one of the new [employer].  His download indicates that he uses machine nightly for compliance and 100%.  He uses machine greater than 4 hours each night.  On average he uses his machine 8 hours and 58 minutes.  His residual AHI is 0.4 on 8 to 16 cm of water.  He reports that he is still getting used to the machine but denies any new issues.  He joins me today for follow-up.  HISTORY (Copied from Dr.Dohmeier's note) Stephen Holland is a 26 - year- old Caucasian male patient seen here upon  referral by PCP, on 01/08/2021 for a new sleep evaluation.  he is an active pateint of Dr. Leta Baptist.  Chief concern according to patient :  " The machine is old, can't adjust nothing, all water is gone in AM. "   I have the pleasure of seeing Stephen Holland today, a right-handed White or Caucasian male with a OSA and COPD overlap disorder.  he  has a past medical history of Colon polyps, DJD (degenerative joint disease), ED (erectile dysfunction), Gouty arthritis, nasal obstruction, congestion,  Hyperlipemia ( cholesterol and triglyceride) , Hypertension, active tobacco user, chronic dysphonia, Obstructive sleep apnea, Prostate cancer ([place]) (01/2002), Neuropathy on Gabapentin- exam for  DOT, and Vitamin D deficiency..   The patient had the first sleep study in the year 2012 and the study records are not available here.  Sleep relevant medical history:  hyperkalemia, snoring, hearing loss.    Family medical /sleep history: No  other family member on CPAP with OSA, insomnia, sleep walkers.    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  He lives with spouse and one dog. The patient  currently works 2 days a week.    Tobacco use; 50 pack year, wife and he relapsed in November after quitting for 8 month.  ETOH use ;  Bourbon, Vodka , Caffeine intake in form of Coffee( 6-8 cups ) Soda( cola - 2-3 week) Tea ( 2 glasses at dinner) or energy drinks. Regular exercise in form of working- no program. .       Sleep habits are as follows: The patient's dinner time is between 5 PM. The patient goes to bed at 8.30 PM and falls asleep by 9-10 Pm continues to sleep for intervals of 3-5 hours, wakes for one bathroom breaks. The preferred sleep position is sideways and prone  , with the support of  pillows.  Dreams are reportedly infrequent.  6  AM is the usual rise time. The patient wakes up spontaneously. He reports not feeling refreshed or restored in AM if he has not used  CPAP , CPAP dependent -      REVIEW OF SYSTEMS: Out of a complete 14 system review of symptoms, the patient complains only of the following symptoms, and all other reviewed systems are negative.   ESS 2  ALLERGIES: No Known Allergies  HOME MEDICATIONS: Outpatient Medications Prior to Visit  Medication Sig Dispense Refill   bisoprolol (ZEBETA) 10 MG tablet Take  1 tablet  Daily  for BP (Patient taking differently: Take 10 mg by mouth every morning. for BP) 90 tablet 1  Cholecalciferol (VITAMIN D3) 125 MCG (5000 UT) CAPS Take 10,000 Units by mouth every morning.     clonazePAM (KLONOPIN) 1 MG tablet Take  1/2 - 1 tablet  at Bedtime  ONLY  if needed forSleep &  limit to 5 days /week to avoid Addiction & Dementia (Patient taking differently: Take 1 mg by mouth at bedtime as needed (restless legs). limit to 5 days /week to avoid Addiction & Dementia) 90 tablet 0   gabapentin (NEURONTIN) 600 MG tablet TAKE 1/2 TO 1 TABLET 2 TO 3 TIMES DAILY AS NEEDED FOR NEUROPATHY PAIN (Patient taking differently: Take 600 mg by mouth 2 (two) times daily. FOR NEUROPATHY PAIN) 270 tablet 3   Krill Oil 350 MG CAPS Take 350 mg by  mouth every morning.     Magnesium 400 MG CAPS Take 800 mg by mouth every morning.     meloxicam (MOBIC) 15 MG tablet TAKE 1 TABLET BY MOUTH DAILY AS NEEDED WITH FOOD FOR PAIN & INFLAMMATION (Patient taking differently: Take 15 mg by mouth every morning. FOR PAIN & INFLAMMATION) 90 tablet 3   PRESCRIPTION MEDICATION Inhale into the lungs at bedtime. CPAP     rosuvastatin (CRESTOR) 40 MG tablet TAKE 1 TABLET BY MOUTH EVERY DAY FOR CHOLESTEROL (Patient taking differently: Take 40 mg by mouth every morning. FOR CHOLESTEROL) 90 tablet 3   telmisartan (MICARDIS) 80 MG tablet TAKE 1 TABLET DAILY FOR BLOOD PRESSURE (Patient taking differently: Take 80 mg by mouth every morning. FOR BLOOD PRESSURE) 90 tablet 3   No facility-administered medications prior to visit.    PAST MEDICAL HISTORY: Past Medical History:  Diagnosis Date   AKI (acute kidney injury) ([place]) 03/01/2021   Colon polyps    DJD (degenerative joint disease)    ED (erectile dysfunction)    Gout    Hyperlipemia    Hypertension    Obstructive sleep apnea    08/2012, cpap setting of 14   Prostate cancer ([place]) 01/2002   s/p prostatectomy   Vitamin D deficiency     PAST SURGICAL HISTORY: Past Surgical History:  Procedure Laterality Date   CATARACT EXTRACTION, BILATERAL Bilateral 12/2020   Dr. Tommy Rainwater   COLONOSCOPY WITH PROPOFOL N/A 09/06/2016   Procedure: COLONOSCOPY WITH PROPOFOL;  Surgeon: Garlan Fair, MD;  Location: [facility];  Service: Endoscopy;  Laterality: N/A;   colonscopy     x 3 with polyp one time   PROSTATECTOMY  2003   SKIN CANCER EXCISION      FAMILY HISTORY: Family History  Problem Relation Age of Onset   Hypertension Mother    Pulmonary fibrosis Mother    Stroke Father    Heart attack Maternal Grandmother    Stroke Maternal Grandfather    Stroke Paternal Grandmother    Colon cancer Paternal Grandfather     SOCIAL HISTORY: Social History   Socioeconomic History   Marital status: Married     Spouse name: [name]   Number of children: 2   Years of education: 12   Highest education level: Not on file  Occupational History    Comment: truck driver  Tobacco Use   Smoking status: Former    Packs/day: 1.00    Years: 45.00    Pack years: 45.00    Types: Cigarettes    Quit date: 03/01/2021    Years since quitting: 0.2   Smokeless tobacco: Never  Vaping Use   Vaping Use: Never used  Substance and Sexual Activity   Alcohol use: Yes  Alcohol/week: 15.0 standard drinks    Types: 15 Standard drinks or equivalent per week    Comment: 03/31/20  2-3 mixed drinks per day   Drug use: No   Sexual activity: Not on file  Other Topics Concern   Not on file  Social History Narrative   Lives home with wife. Retired.  Education HS.  Children 2     Caffeine 4 cups daily and one coke.   Social Determinants of Health   Financial Resource Strain: Not on file  Food Insecurity: Not on file  Transportation Needs: Not on file  Physical Activity: Not on file  Stress: Not on file  Social Connections: Not on file  Intimate Partner Violence: Not on file      PHYSICAL EXAM  Vitals:   06/04/21 1012  BP: (!) 173/80  Pulse: 64  Weight: 204 lb (92.5 kg)  Height: 5\' 7"  (1.702 m)   Body mass index is 31.95 kg/m.  Generalized: Well developed, in no acute distress  Chest: Lungs clear to auscultation bilaterally  Neurological examination  Mentation: Alert oriented to time, place, history taking. Follows all commands speech and language fluent Cranial nerve II-XII: Extraocular movements were full, visual field were full on confrontational test Head turning and shoulder shrug  were normal and symmetric. Motor: The motor testing reveals 5 over 5 strength of all 4 extremities. Good symmetric motor tone is noted throughout.  Sensory: Sensory testing is intact to soft touch on all 4 extremities. No evidence of extinction is noted.  Gait and station: Gait is normal.    DIAGNOSTIC DATA (LABS,  IMAGING, TESTING) - I reviewed patient records, labs, notes, testing and imaging myself where available.  Lab Results  Component Value Date   WBC 12.0 (H) 03/25/2021   HGB 13.5 03/25/2021   HCT 39.7 03/25/2021   MCV 92.1 03/25/2021   PLT 124 (L) 03/25/2021      Component Value Date/Time   NA 139 03/25/2021 1123   NA 139 05/08/2020 0922   K 4.4 03/25/2021 1123   CL 104 03/25/2021 1123   CO2 26 03/25/2021 1123   GLUCOSE 75 03/25/2021 1123   BUN 40 (H) 03/25/2021 1123   BUN 29 (H) 05/08/2020 0922   CREATININE 1.56 (H) 03/25/2021 1123   CALCIUM 10.1 03/25/2021 1123   PROT 7.1 03/25/2021 1123   ALBUMIN 4.1 06/13/2017 1004   AST 19 03/25/2021 1123   ALT 23 03/25/2021 1123   ALKPHOS 58 06/13/2017 1004   BILITOT 0.6 03/25/2021 1123   GFRNONAA 44 (L) 03/25/2021 1123   GFRAA 51 (L) 03/25/2021 1123   Lab Results  Component Value Date   CHOL 111 03/25/2021   HDL 38 (L) 03/25/2021   LDLCALC 46 03/25/2021   TRIG 200 (H) 03/25/2021   CHOLHDL 2.9 03/25/2021   Lab Results  Component Value Date   HGBA1C 6.1 (H) 03/04/2021   Lab Results  Component Value Date   VITAMINB12 517 08/21/2020   Lab Results  Component Value Date   TSH 1.17 03/25/2021      ASSESSMENT AND PLAN 70 y.o. year old male  has a past medical history of AKI (acute kidney injury) ([place]) (03/01/2021), Colon polyps, DJD (degenerative joint disease), ED (erectile dysfunction), Gout, Hyperlipemia, Hypertension, Obstructive sleep apnea, Prostate cancer ([place]) (01/2002), and Vitamin D deficiency. here with:  OSA on CPAP  - CPAP compliance excellent - Good treatment of AHI  - Encourage patient to use CPAP nightly and > 4 hours each night -  F/U in 1 year or sooner if needed    Ward Givens, MSN, NP-C 06/04/2021, 10:06 AM [facility] [address] [place], [place] [address] [phone]

## 2021-06-04 NOTE — Progress Notes (Signed)
Primary neurologist is Dr Penumalli/  I agree with the assessment and plan as directed by NP on this visit . I was available for consultation.   Larey Seat, MD

## 2021-06-05 ENCOUNTER — Ambulatory Visit: Payer: Medicare Other | Admitting: Cardiovascular Disease

## 2021-06-05 DIAGNOSIS — G4733 Obstructive sleep apnea (adult) (pediatric): Secondary | ICD-10-CM | POA: Diagnosis not present

## 2021-06-17 DIAGNOSIS — G4733 Obstructive sleep apnea (adult) (pediatric): Secondary | ICD-10-CM | POA: Diagnosis not present

## 2021-06-23 DIAGNOSIS — G4733 Obstructive sleep apnea (adult) (pediatric): Secondary | ICD-10-CM | POA: Diagnosis not present

## 2021-06-24 NOTE — Patient Instructions (Signed)

## 2021-06-24 NOTE — Progress Notes (Signed)
Future Appointments  Date Time Provider [place]  06/25/2021 10:30 AM [name], MD [facility] None  09/08/2021 11:00 AM Garnet Sierras, NP [facility] None  12/21/2021  2:00 PM [name], MD [facility] None  03/25/2022 10:30 AM Liane Comber, NP [facility] None  06/09/2022 11:00 AM Ward Givens, NP [facility] None    History of Present Illness:       This very nice 70 y.o.  MWM presents for 6 month follow up with HTN, HLD, Pre-Diabetes and Vitamin D Deficiency. Patient has COPD (67 pk yr smoker) and is on CPAP for OSA .  Patient also relates having diarrhea 5-6 x /day for the last 10 +/- days . No fever, chills, rash or blood in BM's.        Patient is treated for HTN (1980's) & BP has been controlled at home. Patient relates that he failed to take his BP meds this morning & today's BP is elevated at 146/82. Patient has hx/o non bstructive CAD.  Patient has had no complaints of any cardiac type chest pain, palpitations, dyspnea / orthopnea / PND, dizziness, claudication, or dependent edema.       Hyperlipidemia is controlled with diet & Rosuvastatin. Patient denies myalgias or other med SE's. Last Lipids were at goal except elevated Trig's:  Lab Results  Component Value Date   CHOL 111 03/25/2021   HDL 38 (L) 03/25/2021   LDLCALC 46 03/25/2021   TRIG 200 (H) 03/25/2021   CHOLHDL 2.9 03/25/2021     Also, the patient has moderate Obesity (BMI 32+) and history of PreDiabetes  (A1c 5.7% /2011) and has had no symptoms of reactive hypoglycemia, diabetic polys, paresthesias or visual blurring.  Last A1c was not at goal:  Lab Results  Component Value Date   HGBA1C 6.1 (H) 03/04/2021                                                         Further, the patient also has history of Vitamin D Deficiency ("12" /2009) and supplements vitamin D without any suspected side-effects. Last vitamin D was at goal:  Lab Results  Component Value Date   VD25OH 76  12/03/2020     Current Outpatient Medications on File Prior to Visit  Medication Sig   bisoprolol  10 MG tablet Take  1 tablet  Daily  for BP   \ITAMIN D 5000 u Take 10,000 Units  every morning.   clonazePAM 1 MG tablet Take  1/2 - 1 tablet  at Bedtime  ONLY  if needed    gabapentin 600 MG tablet TAKE 1/2 TO 1 TABLET 2 TO 3 TIMES DAILY AS NEEDED    Krill Oil 350 MG CAPS Take  every morning.   Magnesium 400 MG CAPS Take 800 mg very morning.   meloxicam  15 MG tablet TAKE 1 TABLET  DAILY AS NEEDED    rosuvastatin  40 MG tablet TAKE 1 TABLET EVERY DAY    telmisartan (MICARDIS) 80 MG tablet TAKE 1 TABLET DAILY PRESSURE)    No Known Allergies   PMHx:   Past Medical History:  Diagnosis Date   AKI (acute kidney injury) ([place]) 03/01/2021   Colon polyps    DJD (degenerative joint disease)    ED (erectile dysfunction)    Gout  Hyperlipemia    Hypertension    Obstructive sleep apnea    08/2012, cpap setting of 14   Prostate cancer ([place]) 01/2002   s/p prostatectomy   Vitamin D deficiency      Immunization History  Administered Date(s) Administered   Fluad Quad(high Dose 65+) 10/03/2020   Influenza Split 08/26/2014   Influenza, High Dose Seasonal PF 08/12/2016, 10/17/2018, 09/05/2019   Influenza, Seasonal, Injecte, Preservative Fre 10/27/2015   PFIZER(Purple Top)SARS-COV-2 Vaccination 01/05/2020, 01/26/2020, 09/27/2020   PPD Test 05/23/2014, 07/23/2015   Pneumococcal Conjugate-13 08/26/2014   Pneumococcal Polysaccharide-23 05/05/2010, 01/30/2016   Td 11/30/2003   Tdap 08/12/2010, 08/21/2020, 03/01/2021   Zoster, Live 04/29/2013     Past Surgical History:  Procedure Laterality Date   CATARACT EXTRACTION, BILATERAL Bilateral 12/2020   Dr. Tommy Rainwater   COLONOSCOPY WITH PROPOFOL N/A 09/06/2016   Procedure: COLONOSCOPY WITH PROPOFOL;  Surgeon: Garlan Fair, MD;  Location: [facility];  Service: Endoscopy;  Laterality: N/A;   colonscopy     x 3 with polyp one time    PROSTATECTOMY  2003   SKIN CANCER EXCISION      FHx:    Reviewed / unchanged  SHx:    Reviewed / unchanged   Systems Review:  Constitutional: Denies fever, chills, wt changes, headaches, insomnia, fatigue, night sweats, change in appetite. Eyes: Denies redness, blurred vision, diplopia, discharge, itchy, watery eyes.  ENT: Denies discharge, congestion, post nasal drip, epistaxis, sore throat, earache, hearing loss, dental pain, tinnitus, vertigo, sinus pain, snoring.  CV: Denies chest pain, palpitations, irregular heartbeat, syncope, dyspnea, diaphoresis, orthopnea, PND, claudication or edema. Respiratory: denies cough, dyspnea, DOE, pleurisy, hoarseness, laryngitis, wheezing.  Gastrointestinal: Denies dysphagia, odynophagia, heartburn, reflux, water brash, abdominal pain or cramps, nausea, vomiting, bloating, diarrhea, constipation, hematemesis, melena, hematochezia  or hemorrhoids. Genitourinary: Denies dysuria, frequency, urgency, nocturia, hesitancy, discharge, hematuria or flank pain. Musculoskeletal: Denies arthralgias, myalgias, stiffness, jt. swelling, pain, limping or strain/sprain.  Skin: Denies pruritus, rash, hives, warts, acne, eczema or change in skin lesion(s). Neuro: No weakness, tremor, incoordination, spasms, paresthesia or pain. Psychiatric: Denies confusion, memory loss or sensory loss. Endo: Denies change in weight, skin or hair change.  Heme/Lymph: No excessive bleeding, bruising or enlarged lymph nodes.  Physical Exam  BP (!) 146/82   Pulse 65   Temp (!) 97.5 F (36.4 C)   Resp 16   Ht '5\' 7"'$  (1.702 m)   Wt 203 lb (92.1 kg)   SpO2 95%   BMI 31.79 kg/m   Appears  over nourished, well groomed  and in no distress.  Eyes: PERRLA, EOMs, conjunctiva no swelling or erythema. Sinuses: No frontal/maxillary tenderness ENT/Mouth: EAC's clear, TM's nl w/o erythema, bulging. Nares clear w/o erythema, swelling, exudates. Oropharynx clear without erythema or exudates.  Oral hygiene is good. Tongue normal, non obstructing. Hearing intact.  Neck: Supple. Thyroid not palpable. Car 2+/2+ without bruits, nodes or JVD. Chest: Respirations nl with BS clear & equal w/o rales, rhonchi, wheezing or stridor.  Cor: Heart sounds normal w/ regular rate and rhythm without sig. murmurs, gallops, clicks or rubs. Peripheral pulses normal and equal  without edema.  Abdomen: Soft & bowel sounds normal. Non-tender w/o guarding, rebound, hernias, masses or organomegaly.  Lymphatics: Unremarkable.  Musculoskeletal: Full ROM all peripheral extremities, joint stability, 5/5 strength and normal gait.  Skin: Warm, dry without exposed rashes, lesions or ecchymosis apparent.  Neuro: Cranial nerves intact, reflexes equal bilaterally. Sensory-motor testing grossly intact. Tendon reflexes grossly intact.  Pysch: Alert &  oriented x 3.  Insight and judgement nl & appropriate. No ideations.  Assessment and Plan:   1. Essential hypertension  - Reminded to take his BP meds EVERY day and recommended that he take his Bisoprolol in the am an Telmisartan in the pm.   - monitor blood pressure at home.  - Continue DASH diet.  Reminder to go to the ER if any CP,  SOB, nausea, dizziness, severe HA, changes vision/speech.    - CBC with Differential/Platelet - COMPLETE METABOLIC PANEL WITH GFR - Magnesium - TSH  2. Hyperlipidemia, mixed  - Continue diet/meds, exercise,& lifestyle modifications.  - Continue monitor periodic cholesterol/liver & renal functions    - Lipid panel - TSH  3. Abnormal glucose  - Continue diet, exercise  - Lifestyle modifications.  - Monitor appropriate labs. - Continue supplementation. - Hemoglobin A1c - Insulin, random  4. Vitamin D deficiency  - VITAMIN D 25 Hydroxy   5. Medication management  - CBC with Differential/Platelet - COMPLETE METABOLIC PANEL WITH GFR - Magnesium - Lipid panel - TSH - Hemoglobin A1c - Insulin, random - VITAMIN D 25  Hydroxy          Discussed  regular exercise, BP monitoring, weight control to achieve/maintain BMI less than 25 and discussed med and SE's.  Gave patient Rx for Cipro Flagyl & advised if Diarrhea persists to return for GI panel.  Recommended labs to assess and monitor clinical status with further disposition pending results of labs.  I discussed the assessment and treatment plan with the patient. The patient was provided an opportunity to ask questions and all were answered. The patient agreed with the plan and demonstrated an understanding of the instructions.  I provided over 30 minutes of exam, counseling, chart review and  complex critical decision making.        The patient was advised to call back or seek an in-person evaluation if the symptoms worsen or if the condition fails to improve as anticipated.   Kirtland Bouchard, MD

## 2021-06-25 ENCOUNTER — Other Ambulatory Visit: Payer: Self-pay

## 2021-06-25 ENCOUNTER — Ambulatory Visit (INDEPENDENT_AMBULATORY_CARE_PROVIDER_SITE_OTHER): Payer: Medicare Other | Admitting: Internal Medicine

## 2021-06-25 ENCOUNTER — Encounter: Payer: Self-pay | Admitting: Internal Medicine

## 2021-06-25 VITALS — BP 146/82 | HR 65 | Temp 97.5°F | Resp 16 | Ht 67.0 in | Wt 203.0 lb

## 2021-06-25 DIAGNOSIS — R7309 Other abnormal glucose: Secondary | ICD-10-CM | POA: Diagnosis not present

## 2021-06-25 DIAGNOSIS — E559 Vitamin D deficiency, unspecified: Secondary | ICD-10-CM | POA: Diagnosis not present

## 2021-06-25 DIAGNOSIS — Z79899 Other long term (current) drug therapy: Secondary | ICD-10-CM

## 2021-06-25 DIAGNOSIS — E782 Mixed hyperlipidemia: Secondary | ICD-10-CM

## 2021-06-25 DIAGNOSIS — I10 Essential (primary) hypertension: Secondary | ICD-10-CM | POA: Diagnosis not present

## 2021-06-25 MED ORDER — METRONIDAZOLE 500 MG PO TABS: ORAL_TABLET | ORAL | 0 refills | Status: DC

## 2021-06-25 MED ORDER — DEXAMETHASONE 4 MG PO TABS: ORAL_TABLET | ORAL | 0 refills | Status: DC

## 2021-06-25 MED ORDER — CIPROFLOXACIN HCL 500 MG PO TABS: ORAL_TABLET | ORAL | 0 refills | Status: DC

## 2021-06-26 LAB — CBC WITH DIFFERENTIAL/PLATELET
Absolute Monocytes: 554 cells/uL (ref 200–950)
Basophils Absolute: 58 cells/uL (ref 0–200)
Basophils Relative: 0.8 %
Eosinophils Absolute: 151 cells/uL (ref 15–500)
Eosinophils Relative: 2.1 %
HCT: 44.9 % (ref 38.5–50.0)
Hemoglobin: 15.1 g/dL (ref 13.2–17.1)
Lymphs Abs: 2534 cells/uL (ref 850–3900)
MCH: 32.1 pg (ref 27.0–33.0)
MCHC: 33.6 g/dL (ref 32.0–36.0)
MCV: 95.5 fL (ref 80.0–100.0)
MPV: 10.9 fL (ref 7.5–12.5)
Monocytes Relative: 7.7 %
Neutro Abs: 3902 cells/uL (ref 1500–7800)
Neutrophils Relative %: 54.2 %
Platelets: 170 10*3/uL (ref 140–400)
RBC: 4.7 10*6/uL (ref 4.20–5.80)
RDW: 12.5 % (ref 11.0–15.0)
Total Lymphocyte: 35.2 %
WBC: 7.2 10*3/uL (ref 3.8–10.8)

## 2021-06-26 LAB — TSH: TSH: 0.69 mIU/L (ref 0.40–4.50)

## 2021-06-26 LAB — COMPLETE METABOLIC PANEL WITH GFR
AG Ratio: 1.5 (calc) (ref 1.0–2.5)
ALT: 19 U/L (ref 9–46)
AST: 22 U/L (ref 10–35)
Albumin: 4.1 g/dL (ref 3.6–5.1)
Alkaline phosphatase (APISO): 56 U/L (ref 35–144)
BUN/Creatinine Ratio: 20 (calc) (ref 6–22)
BUN: 26 mg/dL — ABNORMAL HIGH (ref 7–25)
CO2: 26 mmol/L (ref 20–32)
Calcium: 9.7 mg/dL (ref 8.6–10.3)
Chloride: 106 mmol/L (ref 98–110)
Creat: 1.28 mg/dL (ref 0.70–1.28)
Globulin: 2.7 g/dL (calc) (ref 1.9–3.7)
Glucose, Bld: 85 mg/dL (ref 65–99)
Potassium: 5.8 mmol/L — ABNORMAL HIGH (ref 3.5–5.3)
Sodium: 142 mmol/L (ref 135–146)
Total Bilirubin: 0.4 mg/dL (ref 0.2–1.2)
Total Protein: 6.8 g/dL (ref 6.1–8.1)
eGFR: 60 mL/min/{1.73_m2} (ref >=60)

## 2021-06-26 LAB — INSULIN, RANDOM: Insulin: 14.1 u[IU]/mL

## 2021-06-26 LAB — LIPID PANEL
Cholesterol: 122 mg/dL (ref <200)
HDL: 43 mg/dL (ref >=40)
LDL Cholesterol (Calc): 60 mg/dL (calc)
Non-HDL Cholesterol (Calc): 79 mg/dL (calc) (ref <130)
Total CHOL/HDL Ratio: 2.8 (calc) (ref <5.0)
Triglycerides: 100 mg/dL (ref <150)

## 2021-06-26 LAB — MAGNESIUM: Magnesium: 1.9 mg/dL (ref 1.5–2.5)

## 2021-06-26 LAB — HEMOGLOBIN A1C
Hgb A1c MFr Bld: 5.2 % of total Hgb (ref <5.7)
Mean Plasma Glucose: 103 mg/dL
eAG (mmol/L): 5.7 mmol/L

## 2021-06-26 LAB — VITAMIN D 25 HYDROXY (VIT D DEFICIENCY, FRACTURES): Vit D, 25-Hydroxy: 88 ng/mL (ref 30–100)

## 2021-06-26 NOTE — Progress Notes (Signed)
============================================================ -   Test results slightly outside the reference range are not unusual. If there is anything important, I will review this with you,  otherwise it is considered normal test values.  If you have further questions,  please do not hesitate to contact me at the office or via My Chart.  ============================================================ ============================================================  -  Total Chol = 128     &     LDl Chol = 60    - Both   Excellent   - Very low risk for Heart Attack  / Stroke ============================================================ ============================================================  -  A1c down from 6.1% to now 5.2% - Excellent ============================================================ ============================================================  -  Vitamin D = 88 - Excellent  ============================================================ ============================================================  - All Else - CBC - Kidneys - Electrolytes - Liver - Magnesium & Thyroid    - all  Normal / OK ============================================================ ============================================================

## 2021-06-29 ENCOUNTER — Other Ambulatory Visit: Payer: Self-pay | Admitting: Internal Medicine

## 2021-07-01 ENCOUNTER — Other Ambulatory Visit: Payer: Self-pay | Admitting: Gastroenterology

## 2021-07-01 DIAGNOSIS — K7460 Unspecified cirrhosis of liver: Secondary | ICD-10-CM

## 2021-07-15 ENCOUNTER — Other Ambulatory Visit: Payer: Self-pay

## 2021-07-15 ENCOUNTER — Ambulatory Visit
Admission: RE | Admit: 2021-07-15 | Discharge: 2021-07-15 | Disposition: A | Discharge status: Home / Self Care | Payer: Medicare Other | Source: Ambulatory Visit | Attending: Gastroenterology | Admitting: Gastroenterology

## 2021-07-15 DIAGNOSIS — K7460 Unspecified cirrhosis of liver: Secondary | ICD-10-CM | POA: Diagnosis not present

## 2021-07-15 DIAGNOSIS — K7689 Other specified diseases of liver: Secondary | ICD-10-CM | POA: Diagnosis not present

## 2021-07-17 ENCOUNTER — Other Ambulatory Visit: Payer: Self-pay | Admitting: Gastroenterology

## 2021-07-17 DIAGNOSIS — K7460 Unspecified cirrhosis of liver: Secondary | ICD-10-CM

## 2021-07-17 DIAGNOSIS — R9389 Abnormal findings on diagnostic imaging of other specified body structures: Secondary | ICD-10-CM

## 2021-07-24 DIAGNOSIS — G4733 Obstructive sleep apnea (adult) (pediatric): Secondary | ICD-10-CM | POA: Diagnosis not present

## 2021-07-25 ENCOUNTER — Ambulatory Visit
Admission: RE | Admit: 2021-07-25 | Discharge: 2021-07-25 | Disposition: A | Discharge status: Home / Self Care | Payer: Medicare Other | Source: Ambulatory Visit | Attending: Gastroenterology | Admitting: Gastroenterology

## 2021-07-25 ENCOUNTER — Other Ambulatory Visit: Payer: Self-pay

## 2021-07-25 DIAGNOSIS — K7460 Unspecified cirrhosis of liver: Secondary | ICD-10-CM

## 2021-07-25 DIAGNOSIS — R9389 Abnormal findings on diagnostic imaging of other specified body structures: Secondary | ICD-10-CM

## 2021-07-25 DIAGNOSIS — R188 Other ascites: Secondary | ICD-10-CM | POA: Diagnosis not present

## 2021-07-25 MED ORDER — GADOBENATE DIMEGLUMINE 529 MG/ML IV SOLN
19.0000 mL | Freq: Once | INTRAVENOUS | Status: AC | PRN
  Administered 2021-07-25: 19 mL via INTRAVENOUS

## 2021-08-06 ENCOUNTER — Other Ambulatory Visit: Payer: Medicare Other

## 2021-08-17 ENCOUNTER — Other Ambulatory Visit: Payer: Self-pay | Admitting: Internal Medicine

## 2021-08-18 ENCOUNTER — Ambulatory Visit: Payer: Medicare HMO | Admitting: Adult Health Nurse Practitioner

## 2021-08-24 DIAGNOSIS — G4733 Obstructive sleep apnea (adult) (pediatric): Secondary | ICD-10-CM | POA: Diagnosis not present

## 2021-09-02 DIAGNOSIS — G4733 Obstructive sleep apnea (adult) (pediatric): Secondary | ICD-10-CM | POA: Diagnosis not present

## 2021-09-08 ENCOUNTER — Ambulatory Visit: Payer: Medicare HMO | Admitting: Adult Health Nurse Practitioner

## 2021-09-23 DIAGNOSIS — G4733 Obstructive sleep apnea (adult) (pediatric): Secondary | ICD-10-CM | POA: Diagnosis not present

## 2021-09-25 ENCOUNTER — Ambulatory Visit: Payer: Medicare Other | Admitting: Nurse Practitioner

## 2021-09-29 NOTE — Progress Notes (Signed)
FOLLOW UP  Assessment and Plan:   Atherosclerosis of aorta Per numerous CTs Control blood pressure, cholesterol, glucose, increase exercise.   Hypertension Continue current medications; consider restarting HCTZ  Monitor blood pressure at home; patient to call if consistently greater than 130/80 Continue DASH diet.   Reminder to go to the ER if any CP, SOB, nausea, dizziness, severe HA, changes vision/speech, left arm numbness and tingling and jaw pain.  Cholesterol LDL at goal; continue statin; triglycerides severely elevated at last visit - discussed at length, dietary information provided Discussed possible fenofibrate should trigs continue to remain severely elevated  Continue low cholesterol diet and exercise.  Check lipid panel.   Other abnormal glucose Recent A1Cs fairly controlled -  Continue diet and exercise.  Perform daily foot/skin check, notify office of any concerning changes.  Defer A1C; check CMP/GFR  Obesity with co morbidities Long discussion about weight loss, diet, and exercise Recommended diet heavy in fruits and veggies and low in animal meats, cheeses, and dairy products, appropriate calorie intake Discussed ideal weight for height Patient will work on continuing portion control, increasing fruits and vegetables, intentional exercise Will follow up in 3 months  COPD/smoking Advised to stop smoking - risks discussed. Declines medications but plans to quit cold [place] soon. Continue Ct lung cancer screening. Consider inhalers if cardiology workup doesn't explain exertional dyspnea.   Vitamin D Def At goal at last visit; continue supplementation to maintain goal of 60-100 Defer Vit D level  Gout Continue allopurinol Diet discussed Check uric acid as needed  Flu Vaccine Need High dose flu vaccine given  Continue diet and meds as discussed. Further disposition pending results of labs. Discussed med's effects and SE's.   Over 30 minutes of exam,  counseling, chart review, and critical decision making was performed.   Future Appointments  Date Time Provider [place]  12/21/2021  2:00 PM [name], MD [facility] None  03/25/2022 10:30 AM Liane Comber, NP [facility] None  06/09/2022 11:00 AM Ward Givens, NP [facility] None    ----------------------------------------------------------------------------------------------------------------------  HPI 70 y.o. male  presents for 3 month follow up on hypertension, cholesterol, glucose management (hx of prediabetes), obesity, COPD and vitamin D deficiency.   he has a diagnosis of COPD but currently continues to smoke 1 pack a day, quit in April but restarted, discussed risks associated with smoking, patient is thinking about quitting, but wife also smokes and needs to quit at the same time. Declines medications.     He has OSA - wears CPAP, wears "99% of the time" with restorative sleep.   Klonopin 1 mg, takes in the evening 2-3 days per week for sleep. Also takes gabapentin for neuropathic pain and sleep but he reports can't tell if this helps. Has neck/back pain with radicular symptoms, follows with Dr. Rita Ohara, has done injections without benefit, was recommended surgery but patient preferred to defer.   He reports very low water intake, drinks 3-4 cups of coffee and 2 glasses of tea, 1 coke daily, reports minimal water. He reports doesn't eat out, but doesn't particularly watch sodium. Does add salt with salt shaker.   BMI is Body mass index is 31.26 kg/m., he has been working on diet- has been working on cutting back on portions. He does not currently exercise intentionally.  Wt Readings from Last 3 Encounters:  09/30/21 199 lb 9.6 oz (90.5 kg)  06/25/21 203 lb (92.1 kg)  06/04/21 204 lb (92.5 kg)   His has BP cuff, hasn't been  checking as typically fair control, today their BP is BP: 136/84 He is currently taking Zebeta 10 mg, telmisartan 80 mg.   He does not  workout. He denies chest pain, shortness of breath, dizziness.   He is on cholesterol medication (rosuvastatin 40 mg daily) and denies myalgias. His LDL cholesterol is at goal; triglycerides are not at goal. He reports he drinks about 4 ounces of burbon or scotch most days, poorly motivated to cut back. The cholesterol last visit was:   Lab Results  Component Value Date   CHOL 122 06/25/2021   HDL 43 06/25/2021   LDLCALC 60 06/25/2021   TRIG 100 06/25/2021   CHOLHDL 2.8 06/25/2021    He has been working on diet for glucose management, and denies increased appetite, nausea, paresthesia of the feet, polydipsia, polyuria, visual disturbances, vomiting and weight loss. Last A1C in the office was:  Lab Results  Component Value Date   HGBA1C 5.2 06/25/2021   Patient is on Vitamin D supplement and near goal at most recent check:    Lab Results  Component Value Date   VD25OH 88 06/25/2021     Patient is on allopurinol for gout and does not report a recent flare.  Lab Results  Component Value Date   LABURIC 3.2 (L) 03/10/2021         Current Medications:  Current Outpatient Medications on File Prior to Visit  Medication Sig   bisoprolol (ZEBETA) 10 MG tablet TAKE 1 TABLET DAILY FOR BLOOD PRESSURE   Cholecalciferol (VITAMIN D3) 125 MCG (5000 UT) CAPS Take 10,000 Units by mouth every morning.   clonazePAM (KLONOPIN) 1 MG tablet TAKE 1/2 - 1 TABLET AT BEDTIME ONLY IF NEEDED FORSLEEP & LIMIT TO 5 DAYS /WEEK TO AVOID ADDICTION & DEMENTIA   gabapentin (NEURONTIN) 600 MG tablet TAKE 1/2 TO 1 TABLET 2 TO 3 TIMES DAILY AS NEEDED FOR NEUROPATHY PAIN (Patient taking differently: Take 600 mg by mouth 2 (two) times daily. FOR NEUROPATHY PAIN)   Krill Oil 350 MG CAPS Take 350 mg by mouth every morning.   Magnesium 400 MG CAPS Take 800 mg by mouth every morning.   meloxicam (MOBIC) 15 MG tablet TAKE 1 TABLET BY MOUTH DAILY AS NEEDED WITH FOOD FOR PAIN & INFLAMMATION (Patient taking differently: Take  15 mg by mouth every morning. FOR PAIN & INFLAMMATION)   PRESCRIPTION MEDICATION Inhale into the lungs at bedtime. CPAP   rosuvastatin (CRESTOR) 40 MG tablet TAKE 1 TABLET BY MOUTH EVERY DAY FOR CHOLESTEROL (Patient taking differently: Take 40 mg by mouth every morning. FOR CHOLESTEROL)   telmisartan (MICARDIS) 80 MG tablet TAKE 1 TABLET DAILY FOR BLOOD PRESSURE (Patient taking differently: Take 80 mg by mouth every morning. FOR BLOOD PRESSURE)   ciprofloxacin (CIPRO) 500 MG tablet Take 1 tablet 2 x /day with Meals for Infection (Patient not taking: Reported on 09/30/2021)   dexamethasone (DECADRON) 4 MG tablet Take 1 tab 3 x day - 3 days, then 2 x day - 3 days, then 1 tab daily (Patient not taking: Reported on 09/30/2021)   metroNIDAZOLE (FLAGYL) 500 MG tablet Take 1 tablet 3 x / day after meals for Infection (Patient not taking: Reported on 09/30/2021)   No current facility-administered medications on file prior to visit.     Allergies:  No Known Allergies   Medical History:  Past Medical History:  Diagnosis Date   AKI (acute kidney injury) ([place]) 03/01/2021   Colon polyps    DJD (degenerative joint  disease)    ED (erectile dysfunction)    Gout    Hyperlipemia    Hypertension    Obstructive sleep apnea    08/2012, cpap setting of 14   Prostate cancer ([place]) 01/2002   s/p prostatectomy   Vitamin D deficiency    Family history- Reviewed and unchanged Social history- Reviewed and unchanged   Review of Systems:  Review of Systems  Constitutional:  Negative for malaise/fatigue and weight loss.  HENT:  Negative for congestion, hearing loss, nosebleeds, sore throat and tinnitus.   Eyes:  Negative for blurred vision and double vision.  Respiratory:  Positive for shortness of breath (exertional). Negative for cough and wheezing.   Cardiovascular:  Positive for leg swelling (bil x 1 month). Negative for chest pain, palpitations, orthopnea and claudication.  Gastrointestinal:  Negative for  abdominal pain, blood in stool, constipation, diarrhea, heartburn, melena, nausea and vomiting.  Genitourinary: Negative.   Musculoskeletal:  Negative for joint pain and myalgias.  Skin:  Negative for rash.  Neurological:  Negative for dizziness, tingling, sensory change, weakness and headaches.  Endo/Heme/Allergies:  Negative for environmental allergies and polydipsia.  Psychiatric/Behavioral: Negative.    All other systems reviewed and are negative.   Physical Exam: BP 136/84   Pulse (!) 54   Temp 97.7 F (36.5 C)   Wt 199 lb 9.6 oz (90.5 kg)   SpO2 97%   BMI 31.26 kg/m  Wt Readings from Last 3 Encounters:  09/30/21 199 lb 9.6 oz (90.5 kg)  06/25/21 203 lb (92.1 kg)  06/04/21 204 lb (92.5 kg)   General Appearance: Well nourished, in no apparent distress. Eyes: PERRLA, EOMs, conjunctiva no swelling or erythema Sinuses: No Frontal/maxillary tenderness ENT/Mouth: Ext aud canals clear, TMs without erythema, bulging. No erythema, swelling, or exudate on post pharynx.  Tonsils not swollen or erythematous. Hearing normal.  Neck: Supple, thyroid normal.  Respiratory: Respiratory effort normal, BS equal bilaterally no rales, rhonchi, fine wheezing or stridor.  Cardio: RRR with no MRGs. Intact peripheral pulses with bil pitting edema 1-2+, L >R Abdomen: Soft, obese/rounded, non-distended + BS.  Non tender, no guarding, rebound, hernias, masses. Lymphatics: Non tender without lymphadenopathy.  Musculoskeletal: Full ROM, 5/5 strength, Normal gait Skin: Warm, dry without rashes, lesions, ecchymosis.  Neuro: Cranial nerves intact. No cerebellar symptoms.  Psych: Awake and oriented X 3, normal affect, Insight and Judgment appropriate.    Magda Bernheim, NP 10:39 AM [facility]

## 2021-09-30 ENCOUNTER — Other Ambulatory Visit: Payer: Self-pay

## 2021-09-30 ENCOUNTER — Encounter: Payer: Self-pay | Admitting: Nurse Practitioner

## 2021-09-30 ENCOUNTER — Ambulatory Visit (INDEPENDENT_AMBULATORY_CARE_PROVIDER_SITE_OTHER): Payer: Medicare Other | Admitting: Nurse Practitioner

## 2021-09-30 VITALS — BP 136/84 | HR 54 | Temp 97.7°F | Wt 199.6 lb

## 2021-09-30 DIAGNOSIS — Z23 Encounter for immunization: Secondary | ICD-10-CM

## 2021-09-30 DIAGNOSIS — G4733 Obstructive sleep apnea (adult) (pediatric): Secondary | ICD-10-CM | POA: Diagnosis not present

## 2021-09-30 DIAGNOSIS — J449 Chronic obstructive pulmonary disease, unspecified: Secondary | ICD-10-CM

## 2021-09-30 DIAGNOSIS — Z79899 Other long term (current) drug therapy: Secondary | ICD-10-CM | POA: Diagnosis not present

## 2021-09-30 DIAGNOSIS — D696 Thrombocytopenia, unspecified: Secondary | ICD-10-CM

## 2021-09-30 DIAGNOSIS — E782 Mixed hyperlipidemia: Secondary | ICD-10-CM

## 2021-09-30 DIAGNOSIS — R7309 Other abnormal glucose: Secondary | ICD-10-CM

## 2021-09-30 DIAGNOSIS — I700 Atherosclerosis of aorta: Secondary | ICD-10-CM

## 2021-09-30 DIAGNOSIS — M1000 Idiopathic gout, unspecified site: Secondary | ICD-10-CM

## 2021-09-30 DIAGNOSIS — I10 Essential (primary) hypertension: Secondary | ICD-10-CM | POA: Diagnosis not present

## 2021-09-30 DIAGNOSIS — E559 Vitamin D deficiency, unspecified: Secondary | ICD-10-CM | POA: Diagnosis not present

## 2021-09-30 LAB — CBC WITH DIFFERENTIAL/PLATELET
Absolute Monocytes: 735 cells/uL (ref 200–950)
Basophils Absolute: 47 cells/uL (ref 0–200)
Basophils Relative: 0.5 %
Eosinophils Absolute: 121 cells/uL (ref 15–500)
Eosinophils Relative: 1.3 %
HCT: 48.9 % (ref 38.5–50.0)
Hemoglobin: 16.5 g/dL (ref 13.2–17.1)
Lymphs Abs: 2604 cells/uL (ref 850–3900)
MCH: 32 pg (ref 27.0–33.0)
MCHC: 33.7 g/dL (ref 32.0–36.0)
MCV: 95 fL (ref 80.0–100.0)
MPV: 11.1 fL (ref 7.5–12.5)
Monocytes Relative: 7.9 %
Neutro Abs: 5794 cells/uL (ref 1500–7800)
Neutrophils Relative %: 62.3 %
Platelets: 147 10*3/uL (ref 140–400)
RBC: 5.15 10*6/uL (ref 4.20–5.80)
RDW: 13.3 % (ref 11.0–15.0)
Total Lymphocyte: 28 %
WBC: 9.3 10*3/uL (ref 3.8–10.8)

## 2021-09-30 LAB — COMPLETE METABOLIC PANEL WITH GFR
AG Ratio: 1.6 (calc) (ref 1.0–2.5)
ALT: 18 U/L (ref 9–46)
AST: 16 U/L (ref 10–35)
Albumin: 4.4 g/dL (ref 3.6–5.1)
Alkaline phosphatase (APISO): 51 U/L (ref 35–144)
BUN/Creatinine Ratio: 26 (calc) — ABNORMAL HIGH (ref 6–22)
BUN: 34 mg/dL — ABNORMAL HIGH (ref 7–25)
CO2: 27 mmol/L (ref 20–32)
Calcium: 9.9 mg/dL (ref 8.6–10.3)
Chloride: 104 mmol/L (ref 98–110)
Creat: 1.32 mg/dL — ABNORMAL HIGH (ref 0.70–1.28)
Globulin: 2.8 g/dL (calc) (ref 1.9–3.7)
Glucose, Bld: 89 mg/dL (ref 65–99)
Potassium: 5.2 mmol/L (ref 3.5–5.3)
Sodium: 140 mmol/L (ref 135–146)
Total Bilirubin: 0.6 mg/dL (ref 0.2–1.2)
Total Protein: 7.2 g/dL (ref 6.1–8.1)
eGFR: 58 mL/min/{1.73_m2} — ABNORMAL LOW (ref >=60)

## 2021-09-30 LAB — LIPID PANEL
Cholesterol: 128 mg/dL (ref <200)
HDL: 35 mg/dL — ABNORMAL LOW (ref >=40)
LDL Cholesterol (Calc): 67 mg/dL (calc)
Non-HDL Cholesterol (Calc): 93 mg/dL (calc) (ref <130)
Total CHOL/HDL Ratio: 3.7 (calc) (ref <5.0)
Triglycerides: 187 mg/dL — ABNORMAL HIGH (ref <150)

## 2021-09-30 LAB — TSH: TSH: 0.91 mIU/L (ref 0.40–4.50)

## 2021-09-30 NOTE — Patient Instructions (Signed)

## 2021-10-06 ENCOUNTER — Telehealth: Payer: Self-pay | Admitting: Internal Medicine

## 2021-10-06 NOTE — Chronic Care Management (AMB) (Signed)
  Chronic Care Management   Note  10/06/2021 Name: Stephen Holland MRN: [id number] DOB: February 24, 1951  Stephen Holland is a 70 y.o. year old male who is a primary care patient of [name], MD. I reached out to Alfonso Ellis by phone today in response to a referral sent by [name] PCP, [name], MD.   [name] was given information about Chronic Care Management services today including:  CCM service includes personalized support from designated clinical staff supervised by his physician, including individualized plan of care and coordination with other care providers 24/7 contact phone numbers for assistance for urgent and routine care needs. Service will only be billed when office clinical staff spend 20 minutes or more in a month to coordinate care. Only one practitioner may furnish and bill the service in a calendar month. The patient may stop CCM services at any time (effective at the end of the month) by phone call to the office staff.   Patient agreed to services and verbal consent obtained.   Follow up plan:   [place] Secretary/administrator

## 2021-10-22 ENCOUNTER — Encounter: Payer: Self-pay | Admitting: Internal Medicine

## 2021-10-24 DIAGNOSIS — G4733 Obstructive sleep apnea (adult) (pediatric): Secondary | ICD-10-CM | POA: Diagnosis not present

## 2021-11-03 ENCOUNTER — Other Ambulatory Visit: Payer: Self-pay | Admitting: Nurse Practitioner

## 2021-11-03 DIAGNOSIS — G2581 Restless legs syndrome: Secondary | ICD-10-CM

## 2021-11-03 MED ORDER — CLONAZEPAM 1 MG PO TABS: 0.5000 mg | ORAL_TABLET | Freq: Every evening | ORAL | 0 refills | Status: DC | PRN

## 2021-11-04 ENCOUNTER — Ambulatory Visit: Payer: Medicare Other | Admitting: Pharmacist

## 2021-11-04 ENCOUNTER — Other Ambulatory Visit: Payer: Self-pay

## 2021-11-04 DIAGNOSIS — N1831 Chronic kidney disease, stage 3a: Secondary | ICD-10-CM

## 2021-11-04 DIAGNOSIS — M1000 Idiopathic gout, unspecified site: Secondary | ICD-10-CM

## 2021-11-04 DIAGNOSIS — E782 Mixed hyperlipidemia: Secondary | ICD-10-CM

## 2021-11-04 DIAGNOSIS — R7309 Other abnormal glucose: Secondary | ICD-10-CM

## 2021-11-04 DIAGNOSIS — J449 Chronic obstructive pulmonary disease, unspecified: Secondary | ICD-10-CM

## 2021-11-04 DIAGNOSIS — G629 Polyneuropathy, unspecified: Secondary | ICD-10-CM

## 2021-11-04 DIAGNOSIS — I10 Essential (primary) hypertension: Secondary | ICD-10-CM

## 2021-11-10 NOTE — Progress Notes (Signed)
[facility] - Patient Visit with Chart Prep (v 2.6.0)  Stephen Holland, Stephen Holland [id number] 36 years, Male  DOB: 1951-09-15  M: [phone] Care Team: Stephen Holland, Stephen Holland    __________________________________________________ Summary: Stephen Holland is a pleasant 70 year old male who I completed a CCM initial telephone visit with. He complains of coming down with something, fever, feeling achy today. Pt came back from deer hunting last week and feeling bad. He also is having some diarrhea and is trying pepto bismol for this  Recommendations/Changes made from today's visit: Consider Pt for Remote Patient Monitoring BP when available   Patient scheduled for CCM visit with the clinical pharmacist.  Patient is referred for CCM by their PCP and CPP is under general PCP supervision.: At least 2 of these conditions are expected to last 12 months or longer and patient is at significant risk for acute exacerbations and/or functional decline.  Patient has consented to participation in [place] program. Visit Type: Phone Call Date of Upcoming Visit: 11/04/2021  Chronic Conditions Patient's Chronic Conditions: Hypertension (HTN), Chronic Obstructive Pulmonary Disease (COPD), Other, Chronic Kidney Disease (CKD), Hyperlipidemia/Dyslipidemia (HLD), Gout, Cardiovascular Disease (CVD), Diabetes (DM) List Other Conditions (separated by comma): OSA, Neuropathy, DJD, Vitamin D deficiency, ED. Proteinuria, PLMD  Doctor and Hospital Visits Were there PCP Visits in last 6 months?: Yes Visit #1: 09/30/2021- Dr. Melford Holland- Patient was seen for a follow up. Stopped Ciprofloxacin Hcl 500mg , Dexamethasone 4mg , and Metronidazole 500mg .  Visit #2: 06/25/2021- Dr. Melford Holland- Patient was seen for a  follow up. Started Ciprofloxacin Hcl 500mg , Dexamethasone 4mg , and Metronidazole 500mg .  Were there Specialist Visits in last 6 months?: Yes Visit #1: 06/04/2021- Stephen Givens, NP- Patient was seen for OSA and COPD overlap syndrome. No  medication changes.  Was there a Hospital Visit in last 30 days?: No Were there other Hospital Visits in last 6 months?: No  Medication Information Are there any Medication discrepancies?: No Are there any Medication adherence gaps (beyond 5 days past due)?: No Medication adherence rates for the STAR rating drugs: telmisartan (MICARDIS) 80 MG tablet 90 tablet 3 01/15/2021   Sig: TAKE 1 TABLET DAILY FOR BLOOD PRESSURE   rosuvastatin (CRESTOR) 40 MG tablet 90 tablet 3 12/10/2020   Sig: TAKE 1 TABLET BY MOUTH EVERY DAY FOR CHOLESTEROL    List Patient's current Care Gaps: No current Care Gaps identified  Pre-Call Questions (HC) Are you able to connect with Patient: No Were you able to leave a message?: Yes Relevant details: left detailed vm  Disease Assessments  Subjective Information Current BP: 136/84 Current HR: 54 taken on: 10/20/2021 Previous BP: 146/82 Previous HR: 65 taken on: 06/24/2021 Weight: 199 BMI: 31.79 Last GFR: 58 taken on: 09/29/2021 Why did the patient present?: CCM initial visit Marital status?: Married Details: Stephen Holland 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  No strict daily routine Who does the patient spend their time with and what do they do?: Lives with wife Lifestyle habits such as diet and exercise?: Diet: unrestricted. Pt does eat meat, fish poultry. Usually eats 3 meals per day. Drinks 6-8 cups of coffee. Water: 2 bottle of water daily every once in a while will drink diet soda, drink unsweet tea has cut back a lot on these  Exercise Alcohol, tobacco, and illicit drug usage?: Tobacco: Former smoker Alcohol: Yes usually 15-20 drinks per week mostly drinks bourbon Illicit drug use:  None What is the patient's sleep pattern?: No sleep issues How many hours per night does patient typically sleep?: 9 hours Patient pleased with health care they are receiving?:  Yes Family, occupational, and living circumstances relevant to overall health?: Mother (Deceased at age 52) -Hypertension, Pulmonary fibrosis  Father (Deceased at age 58) - Stroke  Maternal Grandmother - Heart attack  Maternal Grandfather - Stroke  Paternal Grandmother - Stroke Paternal Grandfather - Colon cancer Name and location of Current pharmacy: [id number] [place], [place] [address] (Ph: [phone]) Current Rx insurance plan: [employer] Are meds synced by current pharmacy?: Yes Are meds delivered by current pharmacy?: No - delivery available but patient prefers to not use Would patient benefit from direct intervention of clinical lead in dispensing process to optimize clinical outcomes?: Yes Are [facility] pharmacy services available where patient lives?: Yes Is patient disadvantaged to use [facility]?: No [facility] services reviewed with patient and patient wishes to change pharmacy?: No Select reason patient declined to change pharmacies: Patient preference Does patient experience delays in picking up medications due to transportation concerns (getting to pharmacy)?: No Any additional demeanor/Holland notes?: Stephen Holland is a pleasant 70 year old male who I completed a CCM initial telephone visit with. He complains of coming down with something, fever, feeling achy today. Pt came back from deer hunting last week and feeling bad. He also is having some diarrhea and is trying pepto bismol for this  Hypertension (HTN) Assess this condition today?: Yes Is patient able to obtain BP reading today?: Yes BP today is: 144/91 Goal: <130/80 mmHG Hypertension Stage: Stage 2 (SBP >140 or DBP > 90) Is Patient checking BP at home?: Yes Pt. home BP readings are ranging: 130s/78-80s How often does patient miss taking their blood pressure medications?: Rarely forgets Has patient experienced hypotension, dizziness, falls or bradycardia?: No Check present secondary causes  (below) for HTN: Sleep Apnea, Obesity BP RPM device: Does patient qualify?: Yes We discussed: Reducing the amount of salt intake to 1500mg /per day., Targeting 150 minutes of aerobic activity per week, Proper Home BP Measurement, DASH diet:  following a diet emphasizing fruits and vegetables and low-fat dairy products along with whole grains, fish, poultry, and nuts. Reducing red meats and sugars., Limiting the amount of alcohol to 1 or 2 drinks per day., Recommend using a salt substitute to replace your salt if you need flavor. Assessment:: Uncontrolled Drug: Bisoprolol 5mg  QD Assessment: Appropriate, Query Effectiveness Drug: Telmisartan 80mg  QD Assessment: Appropriate, Query Effectiveness HC Follow up: January BP assessment Pharmacist Follow up: Continue to follow BP, CMP  Hyperlipidemia/Dyslipidemia (HLD) Last Lipid panel on: 09/29/2021 TC (Goal<200): 128 LDL: 67 HDL (Goal>40): 35 TG (Goal<150): 187 ASCVD 10-year risk?is:: N/A due to lab values outside the range Assess this condition today?: Yes LDL Goal: <70 Has patient tried and failed any HLD Medications?: No Check present secondary causes (below) that can lead to increased cholesterol levels (multi-choice optional): Beta blockers, CKD We discussed: How to reduce cholesterol through diet/weight management and physical activity., How a diet high in plant sterols (fruits/vegetables/nuts/whole grains/legumes) may reduce your cholesterol., Encouraged increasing fiber to a daily intake of 10-25g/day Assessment:: Uncontrolled Drug: Rosuvastatin 40mg  QD Assessment: Appropriate, Effective, Safe, Accessible HC Follow up: N/A Pharmacist Follow up: Lipid panel, LFTs, S/s rhabdo  Diabetes (DM) Current A1C: 5.2% taken on: 06/24/2021 Previous A1C: 6.1% taken on: 03/03/2021 Type: Pre-Diabetes/Impaired Fasting Glucose Assess this condition today?: Yes Goal A1C: < 6.5 % Type: Pre-Diabetes/Impaired Fasting Glucose We  discussed: Modifying  lifestyle, including to participate in moderate physical activity (e.g., walking) at least 150 minutes per week., Low carbohydrate eating plan with an emphasis on whole grains, legumes, nuts, fruits, and vegetables and minimal refined and processed foods., Other Details: Congratulated patient on controlling blood sugar Assessment:: Controlled Drug: None Assessment: Query need HC Follow up: N/A Pharmacist Follow up: Continue to assess blood sugar and A1c  Chronic Obstructive Pulmonary Disease (COPD) Current Eosinophils: 121 taken on: 09/29/2021 Assess this condition today?: Yes Gold group: A (low sx, < 2 exacerbations / yr) Exacerbations in past year without hospitalization: No Is patient currently Smoking or Vaping?: No Did patient smoke/vape before?: No Home oxygen therapy: No Frequency of SABA/SAMA use: Never Influenza vaccine: Yes Prevnar vaccine: Yes Pneumovax vaccine: Yes Assessment:: Controlled Drug: None Additional Info: Pt doing well, continue to monitor for any exacerbations, wheezing HC Follow up: N/A Pharmacist Follow up: Continue to assess breathing at the following visits  Gout Most recent uric acid: 3.2 taken on: 03/09/2021 Assess this condition today?: Yes When was your last Gout attack?: Serveral years What diet changes have you made to reduce the frequency of your gout attacks?: Limit sodas / fruit juices Have these changes been successful in reducing the frequency of the patient's gout attacks?: Yes We discussed: Limiting consumption of red meat and organ meat (liver, heart, kidneys), Limiting consumption of shellfish, anchovies, sardines and trout, Limiting / avoiding alcohol intake, Maintaining a healthy weight and managing other comorbidities commonly associated with gout Assessment:: Controlled Drug: None Assessment: Query need HC Follow up: N/A Pharmacist Follow up: Continue to monitor UA levels,   Chronic kidney disease (CKD) Previous GFR: 58 taken  on: 09/29/2021 The current microalbumin ratio is: 378 tested on: 12/03/2020 Assess this condition today?: Yes CKD Stage: Stage 3a (GFR 45-60 mL/min) Albuminuria Stage: A3 (>300) Contributing factors for developing CKD: HTN, Proteinuria Is Patient taking statin medication: Yes Is patient taking ACEi / ARB?: Yes Renal dose adjustments recommended?: No We discussed: Limiting dietary sodium intake to less than 2000 mg / day, Maintaining blood pressure control, Maintaining blood glucose control, Avoidance of nephrotoxic drugs (NSAIDs), Other Details: Encouraged patient to try and increase water consumption by 8 ounces per day Assessment:: Controlled Drug: Telmisartan 80mg  QD Assessment: Appropriate, Effective, Safe, Accessible HC Follow up: N/A Pharmacist Follow up: Continue to assess GFR, SCr, adjust meds as necessary  Exercise, Diet and Non-Drug Coordination Needs Additional exercise counseling points. We discussed: targeting at least 150 minutes per week of moderate-intensity aerobic exercise., decreasing sedentary behavior Additional diet counseling points. We discussed: key components of the DASH diet, limiting caffeine intake, limiting alcohol intake, Other Details: Aim to increase water consumption by 1 ounce per day Discussed Non-Drug Care Coordination Needs: Yes Does Patient have Medication financial barriers?: No  Accountable Health Communities Health-Related Social Needs Screening Tool -  SDOH  ([email or web address]) What is your living situation today? (ref #1): I have a steady place to live Think about the place you live. Do you have problems with any of the following? (ref #2): None of the above Within the past 12 months, you worried that your food would run out before you got money to buy more (ref #3): Never true Within the past 12 months, the food you bought just didn't last and you didn't have money to get more (ref #4):  Never true In the past 12 months, has lack of reliable transportation kept you from medical appointments, meetings, work or from getting things needed  for daily living? (ref #5): No In the past 12 months, has the electric, gas, oil, or water company threatened to shut off services in your home? (ref #6): No How often does anyone, including family and friends, physically hurt you? (ref #7): Never (1) How often does anyone, including family and friends, insult or talk down to you? (ref #8): Never (1) How often does anyone, including friends and family, threaten you with harm? (ref #9): Never (1) How often does anyone, including family and friends, scream or curse at you? (ref #10): Never (1)  Engagement Notes Stephen Holland on 11/04/2021 02:33 PM CPP Chart Review: 30 min  CPP Office Visit: 38 min  CPP Office Visit Documentation: 20 min  CPP Coordination of Care:  CPP Care Plan Completion: 22 min CPP Care Plan Review: 6 min Cost Analysis: 67mins HC Chart Prep: 60 mins  Engagement Notes Stephen Holland on 11/04/2021 02:34 PM HC F/u:  Catalina Antigua: HTN assessment   CPP F/u:  12/21/21: Ok [facility] 03/25/22: Ok [facility] Did not reschedule follow up as pt is as of now not availiable on Tuesdays. Will reach out to patient at a later time in May to assess availability   Care Gaps:  [facility]    Patient Name:? Stephen Holland DOB:?? Feb 04, 1951 ?  Last Care Plan Update:?  COMPREHENSIVE CARE PLAN AND GOALS?    HYPERTENSION   MOST RECENT BLOOD PRESSURE:      144/91 (11/05/2021)  MY GOAL BLOOD PRESSURE:   <130/80 CURRENT MEDICATION AND DOSING:   Bisoprolol 5mg  daily, Telmisartan 80mg  daily THE GOALS WE HAVE CHOSEN ARE:     -Maintain an at goal blood pressure   BARRIERS TO ACHIEVING GOALS:   -Medications, lifestyle PLAN TO WORK ON THESE GOALS:   -Take medications regularly    -Check BP at home daily and keep a log of your blood  pressure -Limit Alcohol 1-2 drinks per day   -Reduce salt intake (< 1500mg / day)   -Diet: DASH diet (Choose fruits, vegetables, and low-fat dairy products. Increase whole grains, fish, poultry, nuts. Reduce red meats and sugars)   -Weight: 1 kg = ~1 mmHg reduction   -Exercise    CHOLESTEROL   MOST RECENT LABS:      -TOTAL CHOLESTEROL: 128  -TRIGLYCERIDES: 187  -HDL: 35  -LDL: 67  CURRENT MEDICATION AND DOSING:   Rosuvastatin 40mg  daily THE GOALS WE HAVE CHOSEN ARE:     -Total Cholesterol goal under 200, Triglycerides goal under 150, HDL goal above 40, LDL goal under 100   BARRIERS TO ACHIEVING GOALS:   -Diet PLAN TO WORK ON THESE GOALS:   -Take medication regularly   -Diet: high in plant sterols (fruits/ vegetables/ nuts/ whole grains/ legumes). Increase fiber intake (10-25g/day). Avoid foods high in cholesterol (red meat, egg yolks, dairy, oils/ butter). Choose low-fat options.   -Also, be mindful of foods high in triglycerides such as high-fat dairy food (whole milk, butter), fried foods, and cakes, cookies, sugary drinks -Exercise   -Weight Management     Prediabetes/ Impaired Fasting Glucose   MOST RECENT A1C:         5.2% (06/24/2021) Recent fasting glucose:  89 (09/30/2021)    CURRENT REGIMEN AND DOSING:   -None   THE GOALS WE HAVE CHOSEN ARE:   -Lower A1C/ Fasting Glucose   PLAN TO WORK ON THESE GOALS:     -Great work keeping your blood  sugars under control -Focus on managing/monitoring CVD risk factors, including keeping blood pressure and lipids under goal.    -Modify lifestyle, including participating in moderate physical activity (e.g., walking) at least 150 minutes per week.    -Consider a Mediterranean eating plan with an emphasis on whole grains, legumes, nuts, fruits, and vegetables and minimal refined and processed foods.      ?COPD   CURRENT CLASSIFICATION AS:     GOLD Group A  EOSINOPHIL COUNT:      -EOS ABSOLUTE: 121 (09/29/2021)   CURRENT MEDICATION AND  DOSING:   None  THE GOALS WE HAVE CHOSEN ARE:   -Reduce shortness of breath and reduce hospitalizations due to COPD exacerbations   BARRIERS TO ACHIEVING GOALS:   -None, controlled PLAN TO WORK ON THESE GOALS:     -Educated on ways to prevent exacerbation of COPD   -Stay up to date on influenza and pneumococcal immunizations     -Call CPP or MD immediately if: more coughing, wheezing, SOB than usual; changes in color, thickness or amount of mucus; feeling tired for more than one day; swelling of legs or ankles; trouble sleeping; feeling the need to increase oxygen (low oxygen levels on pulse ox)   -Call 911: severe shortness of breath or chest pain; blue color in lips, fingers; confusion, disorientation, difficulty speaking in full sentences     GOUT   URIC ACID/DATE:    3.2 (03/09/2021) CURRENT MEDICATION AND DOSING:  None THE GOALS WE HAVE CHOSEN ARE:     -Maintain appropriate uric acid level and reduce gout flares   BARRIERS TO ACHIEVING GOALS:   -None, controlled PLAN TO WORK ON THESE GOALS:   -Avoid foods that worsen gout symptoms such as red meat and organ meat (liver, heart, kidneys), shellfish, anchovies, sardines and trout, Limiting / avoiding alcohol intake   Chronic Kidney Disease   Labs:   Previous GFR: 58 taken on: 09/29/2021 The current microalbumin ratio is: 378 tested on: 12/03/2020  PLAN TO WORK ON THESE GOALS:     -Maintaining normal BP and BG   -Diet: Decrease salt and fatty foods. Increase fruit and vegetables   ACTIVE MEDICATION LIST   Your current medication list has been updated. To view, log in to your patient portal.   Call if any changes need to be made.      MEDICATION REVIEW   MEDICATION REVIEW CONDUCTED:   Yes    DATE:    11/05/2021  BEST POSSIBLE MEDICATION HISTORY  SOURCE:   Medical Records  ??    HOW DO I? - WHEN DO I?     GET AHOLD OF MY DOCTOR?    DURING BUSINESS HOURS WHEN THE OFFICE IS OPEN    PHONE: [phone]  AFTER HOURS  [facility] NURSE WHEN THE OFFICE IS CLOSED   PHONE: [phone]   TALK TO [facility] CARE COORDINATOR NAME: Stephen Holland   PHONE: [phone]  EMAIL:  [email or web address]  CARE COORDINATOR STAFF   NAME: Rosary Lively, Marlene Bast, Sandria Bales   PHONE: [phone]    NOTE SECTION   Thank you for participating in the Chronic Care Management (CCM) program with Dr. Newton Holland     This program takes a proactive approach to your health and my team will serve as a resource for you throughout the year. Please follow up at [phone] if you have any questions or experience changes to your overall health. Your next CCM appointment will be conducted with Stephen Holland, PharmD  as follows:      No scheduled appointment. We will reach out in May to schedule a follow up appointment  Rachelle Hora. Jeannett Senior, PharmD  Clinical Pharmacist  [email or web address]  [phone]) [phone] ?

## 2021-11-23 DIAGNOSIS — G4733 Obstructive sleep apnea (adult) (pediatric): Secondary | ICD-10-CM | POA: Diagnosis not present

## 2021-11-26 DIAGNOSIS — M1000 Idiopathic gout, unspecified site: Secondary | ICD-10-CM | POA: Diagnosis not present

## 2021-11-26 DIAGNOSIS — N1831 Chronic kidney disease, stage 3a: Secondary | ICD-10-CM | POA: Diagnosis not present

## 2021-11-26 DIAGNOSIS — E782 Mixed hyperlipidemia: Secondary | ICD-10-CM | POA: Diagnosis not present

## 2021-11-26 DIAGNOSIS — I10 Essential (primary) hypertension: Secondary | ICD-10-CM | POA: Diagnosis not present

## 2021-12-11 DIAGNOSIS — G4733 Obstructive sleep apnea (adult) (pediatric): Secondary | ICD-10-CM | POA: Diagnosis not present

## 2021-12-20 ENCOUNTER — Encounter: Payer: Self-pay | Admitting: Internal Medicine

## 2021-12-20 NOTE — Progress Notes (Signed)
Annual  Screening/Preventative Visit  & Comprehensive Evaluation & Examination  Future Appointments  Date Time Provider Department  12/21/2021  2:00 PM [name], MD [facility]  03/25/2022 10:30 AM Liane Comber, NP [facility]  06/09/2022 11:00 AM Ward Givens, NP [facility]  12/23/2022  2:00 PM [name], MD [facility]            This very nice 71 y.o. MWM presents for a Screening /Preventative Visit & comprehensive evaluation and management of multiple medical co-morbidities.  Patient has been followed for HTN, HLD, Prediabetes and Vitamin D Deficiency.   Patient has hx of Gout controlled on meds. 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 Patient reports improved sleep hygiene.       HTN predates circa 1980's. Patient's BP has been controlled at home.  Today's BP: 140/84.  Patient has CKD3a  (GFR 58) . Chest CT scan on 16 Nov 2019 showed Coronary Artery & Aortic Atherosclerosis .  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 Patient denies any cardiac symptoms as chest pain, palpitations, shortness of breath, dizziness or ankle swelling.       Patient's hyperlipidemia is controlled with diet and Rosuvastatin.  Patient denies myalgias or other medication SE's. Last lipids were at goal with sl elevated Trig's :  Lab Results  Component Value Date   CHOL 128 09/30/2021   HDL 35 (L) 09/30/2021   LDLCALC 67 09/30/2021   TRIG 187 (H) 09/30/2021   CHOLHDL 3.7 09/30/2021         Patient has moderate obesity  (BMI 32+) and is followed expectantly for glucose intolerance (A1c 5.7% /2011) and patient denies reactive hypoglycemic symptoms, visual blurring, diabetic polys or paresthesias. Last A1c was at goal :   Lab Results  Component Value Date   HGBA1C 5.2 06/25/2021          Finally, patient has history of Vitamin D Deficiency ("12" /2009) and last vitamin D was at goal :   Lab Results  Component Value Date   VD25OH 88 06/25/2021     Current  Outpatient Medications on File Prior to Visit  Medication Sig   acetaminophen (TYLENOL) 650 MG CR tablet Take 650 mg  every 8   hours as needed    bisoprolol (ZEBETA) 10 MG tablet TAKE 1 TABLET DAILY    CVITAMIN D 5000 u Take 10,000 Units  every morning.   clonazePAM (KLONOPIN) 1 MG tablet Take 0.5-1 tablets  at bedtime as needed    gabapentin (NEURONTIN) 600 MG tablet TAKE 1 TABLET 2 TIMES DAILY    Krill Oil 350 MG CAPS Take every morning.   Magnesium 400 MG CAPS Take 800 mg every morning.   meloxicam (MOBIC) 15 MG tablet TAKE 1 TABLET DAILY    CPAP Use  at bedtime.    rosuvastatin (CRESTOR) 40 MG tablet TAKE 1 TABLET  EVERY DAY    telmisartan (MICARDIS) 80 MG tablet TAKE 1 TABLET DAILY     No Known Allergies   Past Medical History:  Diagnosis Date   AKI (acute kidney injury) ([place]) 03/01/2021   Colon polyps    DJD (degenerative joint disease)    ED (erectile dysfunction)    Gout    Hyperlipemia    Hypertension    Obstructive sleep apnea    08/2012, cpap setting of 14   Prostate cancer ([place]) 01/2002   s/p prostatectomy   Vitamin D deficiency      Health Maintenance  Topic Date  Due   Zoster Vaccines- Shingrix (1 of 2) Never done   COVID-19 Vaccine (4 - Booster for Pfizer series) 11/22/2020   COLONOSCOPY  09/06/2026   TETANUS/TDAP  03/02/2031   Pneumonia Vaccine 49+ Years old  Completed   INFLUENZA VACCINE  Completed   Hepatitis C Screening  Completed   HPV VACCINES  Aged Out   Fecal DNA (Cologuard)  Discontinued     Immunization History  Administered Date(s) Administered   Fluad Quad(high Dose) 10/03/2020   Influenza Split 08/26/2014   Influenza, High Dose  08/12/2016, 10/17/2018, 09/05/2019, 09/30/2021   Influenza, Seasonal 10/27/2015   PFIZER SARS-COV-2 Vacc  01/05/2020, 01/26/2020, 09/27/2020   PPD Test 05/23/2014, 07/23/2015   Pneumococcal -13 08/26/2014   Pneumococcal -23 05/05/2010, 01/30/2016   Td 11/30/2003   Tdap 08/12/2010, 08/21/2020, 03/01/2021    Zoster, Live 04/29/2013    Last Colon - 09/06/2016 - Dr Mellody Memos - recc 10 yr f/u  - due Oct 2027.   Past Surgical History:  Procedure Laterality Date   CATARACT EXTRACTION, BILATERAL Bilateral 12/2020   Dr. Tommy Rainwater   COLONOSCOPY WITH PROPOFOL N/A 09/06/2016   Procedure: COLONOSCOPY WITH PROPOFOL; Garlan Fair, MD   colonscopy     x 3 with polyp one time   PROSTATECTOMY  2003   SKIN CANCER EXCISION       Family History  Problem Relation Age of Onset   Hypertension Mother    Pulmonary fibrosis Mother    Stroke Father    Heart attack Maternal Grandmother    Stroke Maternal Grandfather    Stroke Paternal Grandmother    Colon cancer Paternal Grandfather      Social History   Tobacco Use   Smoking status: Former    Packs/day: 1.00    Years: 45.00    Pack years: 45.00    Types: Cigarettes    Quit date: 03/01/2021    Years since quitting: 0.8   Smokeless tobacco: Never  Vaping Use   Vaping Use: Never used  Substance Use Topics   Alcohol use: Yes    Alcohol/week: 15.0 standard drinks    Types: 15 Standard drinks or equivalent per week    Comment: 03/31/20  2-3 mixed drinks per day   Drug use: No      ROS Constitutional: Denies fever, chills, weight loss/gain, headaches, insomnia,  night sweats or change in appetite. Does c/o fatigue. Eyes: Denies redness, blurred vision, diplopia, discharge, itchy or watery eyes.  ENT: Denies discharge, congestion, post nasal drip, epistaxis, sore throat, earache, hearing loss, dental pain, Tinnitus, Vertigo, Sinus pain or snoring.  Cardio: Denies chest pain, palpitations, irregular heartbeat, syncope, dyspnea, diaphoresis, orthopnea, PND, claudication or edema Respiratory: denies cough, dyspnea, DOE, pleurisy, hoarseness, laryngitis or wheezing.  Gastrointestinal: Denies dysphagia, heartburn, reflux, water brash, pain, cramps, nausea, vomiting, bloating, diarrhea, constipation, hematemesis, melena, hematochezia, jaundice or  hemorrhoids Genitourinary: Denies dysuria, frequency, discharge, hematuria or flank pain. Has urgency, nocturia x 2-3 & occasional hesitancy. Musculoskeletal: Denies arthralgia, myalgia, stiffness, Jt. Swelling, pain, limp or strain/sprain. Denies Falls. Skin: Denies puritis, rash, hives, warts, acne, eczema or change in skin lesion Neuro: No weakness, tremor, incoordination, spasms, paresthesia or pain Psychiatric: Denies confusion, memory loss or sensory loss. Denies Depression. Endocrine: Denies change in weight, skin, hair change, nocturia, and paresthesia, diabetic polys, visual blurring or hyper / hypo glycemic episodes.  Heme/Lymph: No excessive bleeding, bruising or enlarged lymph nodes.   Physical Exam  BP 140/84    Pulse 63  Temp 97.9 F (36.6 C)    Resp 16    Ht 5\' 7"  (1.702 m)    Wt 202 lb 3.2 oz (91.7 kg)    SpO2 95%    BMI 31.67 kg/m   General Appearance: over nourished and well groomed and in no apparent distress.  Eyes: PERRLA, EOMs, conjunctiva no swelling or erythema, normal fundi and vessels. Sinuses: No frontal/maxillary tenderness ENT/Mouth: EACs patent / TMs  nl. Nares clear without erythema, swelling, mucoid exudates. Oral hygiene is good. No erythema, swelling, or exudate. Tongue normal, non-obstructing. Tonsils not swollen or erythematous. Hearing normal.  Neck: Supple, thyroid not palpable. No bruits, nodes or JVD. Respiratory: Respiratory effort normal.  BS equal and clear bilateral without rales, rhonci, wheezing or stridor. Cardio: Heart sounds are normal with regular rate and rhythm and no murmurs, rubs or gallops. Peripheral pulses are normal and equal bilaterally without edema. No aortic or femoral bruits. Chest: symmetric with normal excursions and percussion.  Abdomen: Soft, with Nl bowel sounds. Nontender, no guarding, rebound, hernias, masses, or organomegaly.  Lymphatics: Non tender without lymphadenopathy.  Musculoskeletal: Full ROM all peripheral  extremities, joint stability, 5/5 strength, and normal gait. Skin: Warm and dry without rashes, lesions, cyanosis, clubbing or  ecchymosis.  Neuro: Cranial nerves intact, reflexes equal bilaterally. Normal muscle tone, no cerebellar symptoms. Sensation intact.  Pysch: Alert and oriented X 3 with normal affect, insight and judgment appropriate.   Assessment and Plan  1. Annual Preventative/Screening Exam    2. Essential hypertension  - EKG 12-Lead - [place], RETROPERITNL ABD,  LTD - Urinalysis, Routine w reflex microscopic - Microalbumin / creatinine urine ratio - CBC with Differential/Platelet - COMPLETE METABOLIC PANEL WITH GFR - Magnesium - TSH  3. Hyperlipidemia, mixed  - EKG 12-Lead - [place], RETROPERITNL ABD,  LTD - Lipid panel - TSH  4. Abnormal glucose  - EKG 12-Lead - [place], RETROPERITNL ABD,  LTD - Hemoglobin A1c - Insulin, random  5. Vitamin D deficiency  - VITAMIN D 25 Hydroxy   6. Coronary artery calcification seen on CT scan  - EKG 12-Lead - Lipid panel  7. Aortic atherosclerosis ([place]) by Chest CT on 12.18/2020  - EKG 12-Lead - [place], RETROPERITNL ABD,  LTD - Lipid panel  8. 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 BPH with obstruction/lower urinary tract symptoms  - PSA  11. Chronic obstructive pulmonary disease ([place])   12. Screening for colorectal cancer  - POC Hemoccult Bld/Stl   13. Prostate cancer screening  - PSA  14. Screening for heart disease  - EKG 12-Lead  15. Screening for AAA (aortic abdominal aneurysm)  - [place], RETROPERITNL ABD,  LTD  16. FHx: heart disease  - EKG 12-Lead - [place], RETROPERITNL ABD,  LTD  17. Former smoker  - EKG 12-Lead - [place], RETROPERITNL ABD,  LTD  18. Medication management  - Urinalysis, Routine w reflex microscopic - Microalbumin / creatinine urine ratio - Uric acid - CBC with Differential/Platelet - COMPLETE METABOLIC PANEL WITH GFR - Magnesium - Lipid  panel - TSH - Hemoglobin A1c - Insulin, random - VITAMIN D 25 Hydroxy           Patient was counseled in prudent diet, weight control to achieve/maintain BMI less than 25, BP monitoring, regular exercise and medications as discussed.  Discussed med effects and SE's. Routine screening labs and tests as requested with regular follow-up as recommended. Over 40 minutes of  exam, counseling, chart review and high complex critical decision making was performed   Kirtland Bouchard, MD

## 2021-12-20 NOTE — Patient Instructions (Signed)

## 2021-12-21 ENCOUNTER — Ambulatory Visit (INDEPENDENT_AMBULATORY_CARE_PROVIDER_SITE_OTHER): Payer: Medicare Other | Admitting: Internal Medicine

## 2021-12-21 ENCOUNTER — Encounter: Payer: Self-pay | Admitting: Internal Medicine

## 2021-12-21 ENCOUNTER — Other Ambulatory Visit: Payer: Self-pay

## 2021-12-21 VITALS — BP 140/84 | HR 63 | Temp 97.9°F | Resp 16 | Ht 67.0 in | Wt 202.2 lb

## 2021-12-21 DIAGNOSIS — Z136 Encounter for screening for cardiovascular disorders: Secondary | ICD-10-CM | POA: Diagnosis not present

## 2021-12-21 DIAGNOSIS — Z1211 Encounter for screening for malignant neoplasm of colon: Secondary | ICD-10-CM

## 2021-12-21 DIAGNOSIS — E559 Vitamin D deficiency, unspecified: Secondary | ICD-10-CM

## 2021-12-21 DIAGNOSIS — E782 Mixed hyperlipidemia: Secondary | ICD-10-CM

## 2021-12-21 DIAGNOSIS — N401 Enlarged prostate with lower urinary tract symptoms: Secondary | ICD-10-CM

## 2021-12-21 DIAGNOSIS — Z8249 Family history of ischemic heart disease and other diseases of the circulatory system: Secondary | ICD-10-CM | POA: Diagnosis not present

## 2021-12-21 DIAGNOSIS — Z0000 Encounter for general adult medical examination without abnormal findings: Secondary | ICD-10-CM

## 2021-12-21 DIAGNOSIS — R7309 Other abnormal glucose: Secondary | ICD-10-CM | POA: Diagnosis not present

## 2021-12-21 DIAGNOSIS — Z87891 Personal history of nicotine dependence: Secondary | ICD-10-CM | POA: Diagnosis not present

## 2021-12-21 DIAGNOSIS — J449 Chronic obstructive pulmonary disease, unspecified: Secondary | ICD-10-CM

## 2021-12-21 DIAGNOSIS — M1000 Idiopathic gout, unspecified site: Secondary | ICD-10-CM

## 2021-12-21 DIAGNOSIS — I2510 Atherosclerotic heart disease of native coronary artery without angina pectoris: Secondary | ICD-10-CM

## 2021-12-21 DIAGNOSIS — Z0001 Encounter for general adult medical examination with abnormal findings: Secondary | ICD-10-CM

## 2021-12-21 DIAGNOSIS — I10 Essential (primary) hypertension: Secondary | ICD-10-CM | POA: Diagnosis not present

## 2021-12-21 DIAGNOSIS — Z125 Encounter for screening for malignant neoplasm of prostate: Secondary | ICD-10-CM

## 2021-12-21 DIAGNOSIS — G4733 Obstructive sleep apnea (adult) (pediatric): Secondary | ICD-10-CM

## 2021-12-21 DIAGNOSIS — I700 Atherosclerosis of aorta: Secondary | ICD-10-CM

## 2021-12-21 DIAGNOSIS — Z79899 Other long term (current) drug therapy: Secondary | ICD-10-CM

## 2021-12-22 LAB — MICROALBUMIN / CREATININE URINE RATIO
Creatinine, Urine: 104 mg/dL (ref 20–320)
Microalb Creat Ratio: 305 mcg/mg creat — ABNORMAL HIGH (ref <30)
Microalb, Ur: 31.7 mg/dL

## 2021-12-22 LAB — URINALYSIS, ROUTINE W REFLEX MICROSCOPIC
Bacteria, UA: NONE SEEN /HPF
Bilirubin Urine: NEGATIVE
Glucose, UA: NEGATIVE
Hgb urine dipstick: NEGATIVE
Hyaline Cast: NONE SEEN /LPF
Ketones, ur: NEGATIVE
Leukocytes,Ua: NEGATIVE
Nitrite: NEGATIVE
RBC / HPF: NONE SEEN /HPF (ref 0–2)
Specific Gravity, Urine: 1.02 (ref 1.001–1.035)
Squamous Epithelial / HPF: NONE SEEN /HPF (ref <=5)
WBC, UA: NONE SEEN /HPF (ref 0–5)
pH: 5.5 (ref 5.0–8.0)

## 2021-12-22 LAB — CBC WITH DIFFERENTIAL/PLATELET
Absolute Monocytes: 708 cells/uL (ref 200–950)
Basophils Absolute: 49 cells/uL (ref 0–200)
Basophils Relative: 0.5 %
Eosinophils Absolute: 136 cells/uL (ref 15–500)
Eosinophils Relative: 1.4 %
HCT: 47.2 % (ref 38.5–50.0)
Hemoglobin: 15.9 g/dL (ref 13.2–17.1)
Lymphs Abs: 2910 cells/uL (ref 850–3900)
MCH: 31.7 pg (ref 27.0–33.0)
MCHC: 33.7 g/dL (ref 32.0–36.0)
MCV: 94.2 fL (ref 80.0–100.0)
MPV: 11.4 fL (ref 7.5–12.5)
Monocytes Relative: 7.3 %
Neutro Abs: 5898 cells/uL (ref 1500–7800)
Neutrophils Relative %: 60.8 %
Platelets: 157 10*3/uL (ref 140–400)
RBC: 5.01 10*6/uL (ref 4.20–5.80)
RDW: 13.3 % (ref 11.0–15.0)
Total Lymphocyte: 30 %
WBC: 9.7 10*3/uL (ref 3.8–10.8)

## 2021-12-22 LAB — COMPLETE METABOLIC PANEL WITH GFR
AG Ratio: 1.6 (calc) (ref 1.0–2.5)
ALT: 18 U/L (ref 9–46)
AST: 20 U/L (ref 10–35)
Albumin: 4.3 g/dL (ref 3.6–5.1)
Alkaline phosphatase (APISO): 49 U/L (ref 35–144)
BUN/Creatinine Ratio: 21 (calc) (ref 6–22)
BUN: 33 mg/dL — ABNORMAL HIGH (ref 7–25)
CO2: 28 mmol/L (ref 20–32)
Calcium: 9.8 mg/dL (ref 8.6–10.3)
Chloride: 107 mmol/L (ref 98–110)
Creat: 1.54 mg/dL — ABNORMAL HIGH (ref 0.70–1.28)
Globulin: 2.7 g/dL (calc) (ref 1.9–3.7)
Glucose, Bld: 85 mg/dL (ref 65–99)
Potassium: 4.7 mmol/L (ref 3.5–5.3)
Sodium: 141 mmol/L (ref 135–146)
Total Bilirubin: 0.5 mg/dL (ref 0.2–1.2)
Total Protein: 7 g/dL (ref 6.1–8.1)
eGFR: 48 mL/min/{1.73_m2} — ABNORMAL LOW (ref >=60)

## 2021-12-22 LAB — PSA: PSA: 0.28 ng/mL (ref <=4.00)

## 2021-12-22 LAB — VITAMIN D 25 HYDROXY (VIT D DEFICIENCY, FRACTURES): Vit D, 25-Hydroxy: 88 ng/mL (ref 30–100)

## 2021-12-22 LAB — INSULIN, RANDOM: Insulin: 28 u[IU]/mL — ABNORMAL HIGH

## 2021-12-22 LAB — LIPID PANEL
Cholesterol: 132 mg/dL (ref <200)
HDL: 40 mg/dL (ref >=40)
LDL Cholesterol (Calc): 62 mg/dL (calc)
Non-HDL Cholesterol (Calc): 92 mg/dL (calc) (ref <130)
Total CHOL/HDL Ratio: 3.3 (calc) (ref <5.0)
Triglycerides: 258 mg/dL — ABNORMAL HIGH (ref <150)

## 2021-12-22 LAB — URIC ACID: Uric Acid, Serum: 7.7 mg/dL (ref 4.0–8.0)

## 2021-12-22 LAB — MAGNESIUM: Magnesium: 1.9 mg/dL (ref 1.5–2.5)

## 2021-12-22 LAB — HEMOGLOBIN A1C
Hgb A1c MFr Bld: 5.4 % of total Hgb (ref <5.7)
Mean Plasma Glucose: 108 mg/dL
eAG (mmol/L): 6 mmol/L

## 2021-12-22 LAB — TSH: TSH: 0.94 mIU/L (ref 0.40–4.50)

## 2021-12-30 ENCOUNTER — Other Ambulatory Visit: Payer: Self-pay | Admitting: Pharmacist

## 2021-12-30 ENCOUNTER — Telehealth: Payer: Self-pay

## 2021-12-30 NOTE — Telephone Encounter (Signed)
Hypertension Review (HC)  Chart Review  Is the patient enrolled in RPM with BP Monitor?: No BP #1 reading (last): 140/84 on: 12/21/2021 BP #2 reading: 136/84 on: 09/30/2021 BP #3 reading: 146/82 on: 06/25/2021 Any of the last 3 BP > 140/90 mmHg?: Yes What recent interventions/DTPs have been made by any provider to improve the patient's conditions in the last 3 months?: 12/21/21-Dr. Melford Aase (PCP)- Pt. presented for annual screening/preventative exam. No medication changes were made. Any recent hospitalizations or ED visits since last visit with CPP?: No  Adherence Review  Adherence rates for STAR metric medications: Bisoprolol 10mg - 12/06/21 90DS Telmisartan 80mg -09/29/21 90DS Rosuvastatin 40mg -12/06/21 90DS Adherence rates for medications indicated for disease state being reviewed: Bisoprolol 10mg - 12/06/21 90DS Telmisartan 80mg -57/32/20 90DS Does the patient have >5 day gap between last estimated fill dates for any of the above medications?: No  Disease State Questions  Able to connect with the Patient?: Yes Is the patient monitoring his/her BP?: Yes How often are you checking your BP?: occasionally Home BP Reading #1 (most recent): 127/66 Home BP Reading #2: 127/72 Home BP Reading #3: 139/72 Is the patient having any low BP Readings <90/60?: No Is the patient having any BP readings above >180/100?: No Is the patient's average BP>140/90?: Yes What is your blood pressure goal?: <140/80 Educate patient to inform proper points on checking BP at home: (Multiselect): When taking resting blood pressure: sit quietly for 5 minutes, not within 30 min. of exercising, no talking., Sit with feet flat on the floor, arm at heart level., Do not drink caffeine or smoke a cigarette at least 30 min. prior to checking., Make sure using the right size cuff, the length of the cuffs bladder should be at least equal to 75% of the circumference of the upper arm. What diet changes  have you made to improve your Blood Pressure Control? (Multiselect): limiting / monitoring salt intake, limiting processed foods What exercise are you doing to improve your Blood Pressure Control? (Multiselect): other Details: Pt. does chores around the house, but no formal exercise d/t back pain  Pharmacist Review (CL)  Adherence gaps identified?: No Drug Therapy Problems identified?: No Assessment: Controlled Other notes: Home BP reading are controlled. Pt should continue to take medications as prescribed. Pt should be due for Telmisartan soon and will assess adherence at next visit.  Pharmacist total time: 23 Minutes HC total time: 29 Minutes

## 2022-01-15 ENCOUNTER — Telehealth: Payer: Self-pay

## 2022-01-15 NOTE — Telephone Encounter (Signed)
01/15/22-Called patient to schedule f/u visit in May w/ CPP. Pt. Is requesting an appointment on Wednesday, Thursday's, or Friday's. Explained to patient that the CPP is only seeing patients on tuesdays at this time. But I will send CPP a message to see if we can schedule him on one of the following days. Told patient I would call him back. Pt. Agreed.  Total time spent: 2 Minutes Vanetta Shawl, [facility]

## 2022-01-17 ENCOUNTER — Other Ambulatory Visit: Payer: Self-pay | Admitting: Adult Health

## 2022-01-17 ENCOUNTER — Other Ambulatory Visit: Payer: Self-pay | Admitting: Nurse Practitioner

## 2022-01-22 ENCOUNTER — Encounter: Payer: Self-pay | Admitting: Orthopaedic Surgery

## 2022-01-26 DIAGNOSIS — M1000 Idiopathic gout, unspecified site: Secondary | ICD-10-CM | POA: Diagnosis not present

## 2022-01-26 DIAGNOSIS — M1831 Unilateral post-traumatic osteoarthritis of first carpometacarpal joint, right hand: Secondary | ICD-10-CM | POA: Diagnosis not present

## 2022-01-26 DIAGNOSIS — I10 Essential (primary) hypertension: Secondary | ICD-10-CM | POA: Diagnosis not present

## 2022-01-26 DIAGNOSIS — E782 Mixed hyperlipidemia: Secondary | ICD-10-CM | POA: Diagnosis not present

## 2022-02-02 NOTE — Telephone Encounter (Signed)
02/02/22-Called patient and confirmed visit w/ cpp and went over precvall questions. ? ?Total time spent: 4 Minutes ?Vanetta Shawl, [facility] ?

## 2022-02-03 ENCOUNTER — Encounter: Payer: Self-pay | Admitting: Orthopaedic Surgery

## 2022-02-03 ENCOUNTER — Other Ambulatory Visit: Payer: Self-pay

## 2022-02-03 ENCOUNTER — Ambulatory Visit: Payer: Medicare Other | Admitting: Orthopaedic Surgery

## 2022-02-03 DIAGNOSIS — R200 Anesthesia of skin: Secondary | ICD-10-CM | POA: Diagnosis not present

## 2022-02-03 NOTE — Progress Notes (Signed)
HPI: Stephen Holland 71 year old male known to Dr. Trevor Mace service comes in with new complaint of bilateral hand numbness tingling.  States has been ongoing for years.  States years ago he was told by the neurosurgeon that it is coming from his neck.  But states that his primary care physician he states feels it is carpal tunnel syndrome.  Denies any radicular symptoms down either arm.  Has difficulty grasping small objects due to numbness in his fingers.  States the decreased sensation is greater in the right than the left hand.  States numbness tingling in his hands keeps him from going to sleep at night.  He did have 1 injection on the right wrist for carpal tunnel 8 years ago that gave him no relief.  He is taking Neurontin and this does help some.  He is nondiabetic. ? ?Review of systems: See HPI otherwise negative or noncontributory. ? ?Physical exam: General well-developed well-nourished pleasant male in no acute distress mood and affect appropriate. ? ?Psych: Alert and oriented x3 ? ?Bilateral hand:No abnormal warmth erythema rashes skin lesions ulcerations.  Subjective decrease sensation over the median nerve distribution of both hands.  Positive Tinel's over the median nerve at the wrist bilaterally.  Negative Phalen's bilaterally.  Full motor bilateral hands.Radial pulse 2+ equal symmetric. ? ?Impression: Bilateral hand numbness ? ?Plan: We will send him for EMG nerve conduction studies to rule out bilateral carpal tunnel syndrome.  Any follow-up after the studies to go over the results and discuss further treatment.  Questions were encouraged and answered at length. ? ? ? ? ?

## 2022-02-04 ENCOUNTER — Ambulatory Visit: Payer: Medicare Other | Admitting: Pharmacist

## 2022-02-04 ENCOUNTER — Other Ambulatory Visit: Payer: Self-pay

## 2022-02-04 VITALS — BP 121/61 | HR 69

## 2022-02-04 DIAGNOSIS — M1000 Idiopathic gout, unspecified site: Secondary | ICD-10-CM

## 2022-02-04 DIAGNOSIS — E782 Mixed hyperlipidemia: Secondary | ICD-10-CM

## 2022-02-04 DIAGNOSIS — N1831 Chronic kidney disease, stage 3a: Secondary | ICD-10-CM

## 2022-02-04 DIAGNOSIS — I10 Essential (primary) hypertension: Secondary | ICD-10-CM

## 2022-02-05 NOTE — Progress Notes (Signed)
Follow Up Pharmacist Visit  Stephen, [name] 89 years, Male  DOB: [birth year]  M: [phone] Care Team: Rhys Martini, Jeremiah Tarpley  __________________________________________________ Clinical Summary Patient Risk: Moderate Next CCM Follow Up: Unable to schedule, Pt unavailable on Mondays and Tuesdays Next AWV: 03/25/2022 Summary for PCP: Stephen Holland is a friendly 71 year old male presenting for CCM follow up visit over the phone. Patient is doing well today and blood pressures are exceptionally controlled. Discussed that uric acid level has increased and patient was instructed to restart allopurinol. Pt had not restarted due to developing a rash in the past and was d/c'd. Encouraged pt to avoid foods that can increase uric acid and limit alcohol consumption. Discussed importance of increasing water intake. Continue to monitor levels, if UA remains elevate, consider Fexobustat for gout prophylaxis Tx ($47 copay through pt insurance)   Chronic Conditions Patient's Chronic Conditions: Hypertension (HTN), Chronic Obstructive Pulmonary Disease (COPD), Chronic Kidney Disease (CKD), Hyperlipidemia/Dyslipidemia (HLD), Gout, Vitamin D deficiency, Prediabetes, Aortic atherosclerosis, CADE, OSA & COPD overlap syndrome, ED, Morbid Obesity, Neuropathy, DJD, Persistent proteinuria  Disease Assessments Current BP: 140/84 Current HR: 63 taken on: 12/21/2021 Weight: 199 BMI: 31.26 Last GFR: 48 taken on: 12/21/2021 Why did the patient present?: CCM follow up visit Is Patient using [facility] pharmacy?: No Name and location of Current pharmacy: [id number] - [place], [place] [address] Current Rx insurance plan: UHC Would patient benefit from direct intervention of clinical lead in dispensing process to optimize clinical outcomes?: Yes Are [facility] pharmacy services available where patient lives?: Yes Is patient disadvantaged to use [facility]?: No [facility]  Pharmacy services reviewed with patient and patient wishes to change pharmacy?: No Select reason patient declined to change pharmacies: Patient preference Does patient experience delays in picking up medications due to transportation concerns (getting to pharmacy)?: No  Hypertension (HTN) Assess this condition today?: Yes Is patient able to obtain BP reading today?: Yes BP today is: 121/61  Heart Rate is: 69  Goal: <130/80 mmHG Hypertension Stage: Elevated (SBP: 120-129 and DBP < 80) Is Patient checking BP at home?: Yes Patient home BP readings are ranging: 127/63 114/63 112/63 139/73 129/66, Pulse 64 65 78 57 62 62 59 66 59 How often does patient miss taking their blood pressure medications?: Sometimes if there is a change in patients routine he will forget. forgot this past weekend due to going out hunting  Has patient experienced hypotension, dizziness, falls or bradycardia?: No Check present secondary causes (below) for HTN: CKD, Sleep Apnea, Obesity We discussed: Reducing the amount of salt intake to '1500mg'$ /per day., Targeting 150 minutes of aerobic activity per week Assessment:: Controlled Drug: telmisartan (MICARDIS) 80 MG tablet TAKE 1 TABLET BY MOUTH EVERY DAY FOR BLOOD PRESSURE Assessment: Appropriate, Effective, Safe, Accessible Drug: bisoprolol (ZEBETA) 10 MG tablet TAKE 1 TABLET BY MOUTH EVERY DAY FOR BLOOD PRESSURE Assessment: Appropriate, Effective, Safe, Accessible Pharmacist Follow up: Continue to monitor home BP reading  Hyperlipidemia/Dyslipidemia (HLD) Last Lipid panel on: 12/21/2021 TC (Goal<200): 132 LDL: 62 HDL (Goal>40): 40 TG (Goal<150): 258 ASCVD 10-year risk?is:: Intermediate (7.5%-20%) ASCVD Risk Score: 14.1% Assess this condition today?: Yes LDL Goal: <70 Has patient tried and failed any HLD Medications?: No Check present secondary causes (below) that can lead to increased cholesterol levels (multi-choice optional): Alcohol use, CKD, Beta blockers We  discussed: How a diet high in plant sterols (fruits/vegetables/nuts/whole grains/legumes) may reduce your cholesterol., Other (provide details below) Details: BE mindful of foods  high in tryglycerides Assessment:: Uncontrolled Drug: Rosuvastatin 40mg  QD Assessment: Appropriate, Query Effectiveness Pharmacist Follow up: Assess LFTs, lipid panel s/s rhabdo  Gout Most recent uric acid: 7.7 taken on: 12/21/2021 Assess this condition today?: Yes When was your last Gout attack?: years ago What diet changes have you made to reduce the frequency of your gout attacks?: Limit sodas / fruit juices Have these changes been successful in reducing the frequency of the patient's gout attacks?: Yes We discussed: Limiting consumption of red meat and organ meat (liver, heart, kidneys), Limiting consumption of shellfish, anchovies, sardines and trout, Limiting / avoiding alcohol intake, Limiting / avoiding drinks high in sugar like fruit juices and sodas, Maintaining a healthy weight and managing other comorbidities commonly associated with gout Assessment:: Uncontrolled Additional Info: Pt stated he had a Rash about 1 year ago due to allopurinol and was D/c'd off of med UA goal < 6 Pharmacist Follow up: Continue to monitor UA level, would recommend Uloric if UA remains elevated  Chronic kidney disease (CKD) Previous GFR: 48 taken on: 12/21/2021 The current microalbumin ratio is: 305 tested on: 12/21/2021 Assess this condition today?: Yes CKD Stage: Stage 3a (GFR 45-60 mL/min) Albuminuria Stage: A3 (>300) Contributing factors for developing CKD: Diabetes, HTN Is Patient taking statin medication: Yes Is patient taking ACEi / ARB?: Yes Renal dose adjustments recommended?: No We discussed: Limiting dietary sodium intake to less than 2000 mg / day, Maintaining blood pressure control, Maintaining blood glucose control, Avoidance of nephrotoxic drugs (NSAIDs), Other Details: Use meloxicam as needed to prevent  further kidney damage Assessment:: Controlled Pharmacist Follow up: Assess eGFR, SCr, adjust meds as needed  Exercise, Diet and Non-Drug Coordination Needs Additional exercise counseling points. We discussed: incorporating flexibility, balance, and strength training exercises Additional diet counseling points. We discussed: aiming to consume at least 8 cups of water day Discussed Non-Drug Care Coordination Needs: Yes Does Patient have Medication financial barriers?: No  Accountable Health Communities Health-Related Social Needs Screening Tool -  SDOH  ([email or web address]) What is your living situation today? (ref #1): I have a steady place to live Think about the place you live. Do you have problems with any of the following? (ref #2): None of the above Within the past 12 months, you worried that your food would run out before you got money to buy more (ref #3): Never true Within the past 12 months, the food you bought just didn't last and you didn't have money to get more (ref #4): Never true In the past 12 months, has lack of reliable transportation kept you from medical appointments, meetings, work or from getting things needed for daily living? (ref #5): No In the past 12 months, has the electric, gas, oil, or water company threatened to shut off services in your home? (ref #6): No How often does anyone, including family and friends, physically hurt you? (ref #7): Never (1) How often does anyone, including family and friends, insult or talk down to you? (ref #8): Never (1) How often does anyone, including friends and family, threaten you with harm? (ref #9): Never (1) How often does anyone, including family and friends, scream or curse at you? (ref #10): Never (1)  Engagement Notes Newton Pigg on 02/04/2022 02:50 PM CPP Chart Review: 20 min CPP Office Visit: 20 min CPP Office Visit Documentation: 32 min CPP Coordination of  Care: [facility] Care Plan Completion: 16 Minutes [place] min  Engagement Notes Newton Pigg on 02/04/2022 02:54 PM Care Gaps: Shingrix  and COVID - Pt uninterested at this time        Rachelle Hora. Jeannett Senior, PharmD  Clinical Pharmacist  [email or web address]  [phone]

## 2022-02-19 ENCOUNTER — Encounter: Payer: Self-pay | Admitting: Physical Medicine and Rehabilitation

## 2022-02-19 ENCOUNTER — Ambulatory Visit (INDEPENDENT_AMBULATORY_CARE_PROVIDER_SITE_OTHER): Payer: Medicare Other | Admitting: Physical Medicine and Rehabilitation

## 2022-02-19 ENCOUNTER — Other Ambulatory Visit: Payer: Self-pay

## 2022-02-19 DIAGNOSIS — R202 Paresthesia of skin: Secondary | ICD-10-CM | POA: Diagnosis not present

## 2022-02-19 NOTE — Progress Notes (Signed)
Pt state he has pain in both hands that travels to his thumb, ring and middle finger. Pt state he has an hard time holding items in his hand. Pt state he has pain in his wrist and most pain comes at night when sleeping. Pt state he takes over the counter pain meds to help ease his pain. Pt state he right handed. ? ?Numeric Pain Rating Scale and Functional Assessment ?Average Pain 2 ? ? ?In the last MONTH (on 0-10 scale) has pain interfered with the following? ? ?1. General activity like being  able to carry out your everyday physical activities such as walking, climbing stairs, carrying groceries, or moving a chair?  ?Rating(6) ? ? ? ? ?

## 2022-02-22 NOTE — Procedures (Signed)
EMG & NCV Findings: ?Evaluation of the left median motor and the right median motor nerves showed prolonged distal onset latency (L9.5, R4.8 ms), reduced amplitude (L3.6, R0.1 mV), and decreased conduction velocity (Elbow-Wrist, L37, R20 m/s).  The left ulnar motor and the right ulnar motor nerves showed decreased conduction velocity (B Elbow-Wrist, L42, R51 m/s).  The left median (across palm) sensory nerve showed prolonged distal peak latency (Wrist, 7.8 ms), reduced amplitude (6.7 ?V), and prolonged distal peak latency (Palm, 5.5 ms).  The right median (across palm) sensory nerve showed no response (Wrist) and no response (Palm).  The right ulnar sensory nerve showed prolonged distal peak latency (4.2 ms) and decreased conduction velocity (Wrist-5th Digit, 33 m/s).  All remaining nerves (as indicated in the following tables) were within normal limits.  Left vs. Right side comparison data for the median motor nerve indicates abnormal L-R latency difference (4.7 ms), abnormal L-R amplitude difference (97.2 %), and abnormal L-R velocity difference (Elbow-Wrist, 17 m/s).  The ulnar sensory nerve indicates abnormal L-R latency difference (0.5 ms).  All remaining left vs. right side differences were within normal limits.   ? ?Needle evaluation of the right abductor pollicis brevis muscle showed decreased insertional activity, widespread spontaneous activity, and diminished recruitment.  All remaining muscles (as indicated in the following table) showed no evidence of electrical instability.   ? 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  The right median nerve is very severe and the lesion is characterized by sensory and motor demyelination with evidence of significant axonal injury.  There is likely some level of permanent damage even with treatment.  The  left median nerve likely would have some good return of function. ? ? ?There is no significant electrodiagnostic evidence of any other focal nerve entrapment, brachial plexopathy or cervical radiculopathy.  ? ?Recommendations: ?1.  Follow-up with referring physician. ?2.  Continue current management of symptoms. ?3.  Suggest surgical evaluation. ? ?___________________________ ?Laurence Spates FAAPMR ?Board Certified, Tax adviser of Physical Medicine and Rehabilitation ? ? ? ?Nerve Conduction Studies ?Anti Sensory Summary Table ? ? Stim Site NR Peak (ms) Norm Peak (ms) P-T Amp (?V) Norm P-T Amp Site1 Site2 Delta-P (ms) Dist (cm) Vel (m/s) Norm Vel (m/s)  ?Left Median Acr Palm Anti Sensory (2nd Digit)  30?C  ?Wrist    *7.8 <3.6 *6.7 >10 Wrist Palm 2.3 0.0    ?Palm    *5.5 <2.0 2.5         ?Right Median Acr Palm Anti Sensory (2nd Digit)  ?Wrist *NR  <3.6  >10 Wrist Palm  0.0    ?Palm *NR  <2.0          ?Left Radial Anti Sensory (Base 1st Digit)  29.9?C  ?Wrist    2.4 <3.1 19.5  Wrist Base 1st Digit 2.4 0.0    ?Right Radial Anti Sensory (Base 1st Digit)  ?Wrist    2.4 <3.1 17.8  Wrist Base 1st Digit 2.4 0.0    ?Left Ulnar Anti Sensory (5th Digit)  30.2?C  ?Wrist    3.7 <3.7 33.7 >15.0 Wrist 5th Digit 3.7 14.0 38 >38  ?Right Ulnar Anti Sensory (5th Digit)  ?Wrist    *4.2 <3.7 25.0 >15.0 Wrist 5th Digit 4.2 14.0 *33 >38  ? ?Motor Summary Table ? ? Stim Site NR Onset (ms) Norm Onset (ms) O-P Amp (mV) Norm O-P Amp  Site1 Site2 Delta-0 (ms) Dist (cm) Vel (m/s) Norm Vel (m/s)  ?Left Median Motor (Abd Poll Brev)  30?C  ?Wrist    *9.5 <4.2 *3.6 >5 Elbow Wrist 5.4 20.0 *37 >50  ?Elbow    14.9  2.8         ?Right Median Motor (Abd Poll Brev)  ?Wrist    *4.8 <4.2 *0.1 >5 Elbow Wrist 10.1 20.0 *20 >50  ?Elbow    14.9  0.0         ?Left Ulnar Motor (Abd Dig Min)  30.2?C  ?Wrist    3.5 <4.2 7.9 >3 B Elbow Wrist 4.5 19.0 *42 >53  ?B Elbow    8.0  5.1  A Elbow B Elbow 1.5 10.0 67 >53  ?A Elbow    9.5  6.6         ?Right Ulnar Motor (Abd  Dig Min)  ?Wrist    3.7 <4.2 8.4 >3 B Elbow Wrist 3.9 20.0 *51 >53  ?B Elbow    7.6  7.4  A Elbow B Elbow 1.2 10.0 83 >53  ?A Elbow    8.8  7.3         ? ?EMG ? ? Side Muscle Nerve Root Ins Act Fibs Psw Amp Dur Poly Recrt Int Fraser Din Comment  ?Right Abd Poll Brev Median C8-T1 *Decr Nml *4+ Nml Nml 0 *Reduced Nml faint background MUAP  ?Right 1stDorInt Ulnar C8-T1 Nml Nml Nml Nml Nml 0 Nml Nml   ?Right PronatorTeres Median C6-7 Nml Nml Nml Nml Nml 0 Nml Nml   ?Right Biceps Musculocut C5-6 Nml Nml Nml Nml Nml 0 Nml Nml   ?Right Deltoid Axillary C5-6 Nml Nml Nml Nml Nml 0 Nml Nml   ? ? ?Nerve Conduction Studies ?Anti Sensory Left/Right Comparison ? ? Stim Site L Lat (ms) R Lat (ms) L-R Lat (ms) L Amp (?V) R Amp (?V) L-R Amp (%) Site1 Site2 L Vel (m/s) R Vel (m/s) L-R Vel (m/s)  ?Median Acr Palm Anti Sensory (2nd Digit)  30?C  ?Wrist *7.8   *6.7   Wrist Palm     ?Palm *5.5   2.5         ?Radial Anti Sensory (Base 1st Digit)  29.9?C  ?Wrist 2.4 2.4 0.0 19.5 17.8 8.7 Wrist Base 1st Digit     ?Ulnar Anti Sensory (5th Digit)  30.2?C  ?Wrist 3.7 *4.2 *0.5 33.7 25.0 25.8 Wrist 5th Digit 38 *33 5  ? ?Motor Left/Right Comparison ? ? Stim Site L Lat (ms) R Lat (ms) L-R Lat (ms) L Amp (mV) R Amp (mV) L-R Amp (%) Site1 Site2 L Vel (m/s) R Vel (m/s) L-R Vel (m/s)  ?Median Motor (Abd Poll Brev)  30?C  ?Wrist *9.5 *4.8 *4.7 *3.6 *0.1 *97.2 Elbow Wrist *37 *20 *17  ?Elbow 14.9 14.9 0.0 2.8 0.0 100.0       ?Ulnar Motor (Abd Dig Min)  30.2?C  ?Wrist 3.5 3.7 0.2 7.9 8.4 6.0 B Elbow Wrist *42 *51 9  ?B Elbow 8.0 7.6 0.4 5.1 7.4 31.1 A Elbow B Elbow 67 83 16  ?A Elbow 9.5 8.8 0.7 6.6 7.3 9.6       ? ? ? ?Waveforms: ?    ? ?    ? ?    ? ?  ? ? ?

## 2022-02-22 NOTE — Progress Notes (Signed)
? ?Stephen CARILLO - 71 y.o. male MRN [id number]  Date of birth: November 14, 1951 ? ?Office Visit Note: ?Visit Date: 02/19/2022 ?PCP: [name], MD ?Referred by: Mcarthur Rossetti* ? ?Subjective: ?Chief Complaint  ?Patient presents with  ? Right Hand - Pain  ? Left Hand - Pain  ? Right Wrist - Pain  ? Left Wrist - Pain  ? ?HPI:  Stephen Holland is a 71 y.o. male who comes in today at the request of Dr. Jean Rosenthal for electrodiagnostic study of the Bilateral upper extremities.  Patient is Right hand dominant.  He reports many years of his hands falling asleep particularly at nighttime and with use.  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  Most of his symptoms are in the radial 3 digits and thumbs on both hands.  He really has a hard time manipulating small objects and is getting weaker in his hands.  He has not had any prior electrodiagnostic study.  He denies any frank radicular symptoms.  He is not diabetic. ? ? ?ROS Otherwise per HPI. ? ?Assessment & Plan: ?Visit Diagnoses:  ?  ICD-10-CM   ?1. Paresthesia of skin  R20.2 NCV with EMG (electromyography)  ?  ?  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  The right median nerve is very severe and the lesion is characterized by sensory and motor demyelination with evidence of significant axonal injury.  There is likely some level of permanent damage even with treatment.  The left median nerve likely would have some good return of function. ? ? ?There is no significant electrodiagnostic evidence of any other focal nerve entrapment, brachial plexopathy or cervical radiculopathy.  ? ?Recommendations: ?1.  Follow-up with referring physician. ?2.  Continue current management of symptoms. ?3.  Suggest surgical evaluation. ? ?Meds & Orders: No orders of the defined types  were placed in this encounter. ?  ?Orders Placed This Encounter  ?Procedures  ? NCV with EMG (electromyography)  ?  ?Follow-up: Return in about 2 weeks (around 03/05/2022) for Jean Rosenthal, MD.  ? ?Procedures: ?No procedures performed  ?EMG & NCV Findings: ?Evaluation of the left median motor and the right median motor nerves showed prolonged distal onset latency (L9.5, R4.8 ms), reduced amplitude (L3.6, R0.1 mV), and decreased conduction velocity (Elbow-Wrist, L37, R20 m/s).  The left ulnar motor and the right ulnar motor nerves showed decreased conduction velocity (B Elbow-Wrist, L42, R51 m/s).  The left median (across palm) sensory nerve showed prolonged distal peak latency (Wrist, 7.8 ms), reduced amplitude (6.7 ?V), and prolonged distal peak latency (Palm, 5.5 ms).  The right median (across palm) sensory nerve showed no response (Wrist) and no response (Palm).  The right ulnar sensory nerve showed prolonged distal peak latency (4.2 ms) and decreased conduction velocity (Wrist-5th Digit, 33 m/s).  All remaining nerves (as indicated in the following tables) were within normal limits.  Left vs. Right side comparison data for the median motor nerve indicates abnormal L-R latency difference (4.7 ms), abnormal L-R amplitude difference (97.2 %), and abnormal L-R velocity difference (Elbow-Wrist, 17 m/s).  The ulnar sensory nerve indicates abnormal L-R latency difference (0.5 ms).  All remaining left vs. right side differences were within normal limits.   ? ?Needle evaluation of the right abductor pollicis brevis muscle showed decreased insertional activity, widespread spontaneous activity,  and diminished recruitment.  All remaining muscles (as indicated in the following table) showed no evidence of electrical instability.   ? 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  The right median nerve is very severe and the lesion is characterized by sensory and motor demyelination with evidence of significant axonal injury.  There is likely some level of permanent damage even with treatment.  The left median nerve likely would have some good return of function. ? ? ?There is no significant electrodiagnostic evidence of any other focal nerve entrapment, brachial plexopathy or cervical radiculopathy.  ? ?Recommendations: ?1.  Follow-up with referring physician. ?2.  Continue current management of symptoms. ?3.  Suggest surgical evaluation. ? ?___________________________ ?Laurence Spates FAAPMR ?Board Certified, Tax adviser of Physical Medicine and Rehabilitation ? ? ? ?Nerve Conduction Studies ?Anti Sensory Summary Table ? ? Stim Site NR Peak (ms) Norm Peak (ms) P-T Amp (?V) Norm P-T Amp Site1 Site2 Delta-P (ms) Dist (cm) Vel (m/s) Norm Vel (m/s)  ?Left Median Acr Palm Anti Sensory (2nd Digit)  30?C  ?Wrist    *7.8 <3.6 *6.7 >10 Wrist Palm 2.3 0.0    ?Palm    *5.5 <2.0 2.5         ?Right Median Acr Palm Anti Sensory (2nd Digit)  ?Wrist *NR  <3.6  >10 Wrist Palm  0.0    ?Palm *NR  <2.0          ?Left Radial Anti Sensory (Base 1st Digit)  29.9?C  ?Wrist    2.4 <3.1 19.5  Wrist Base 1st Digit 2.4 0.0    ?Right Radial Anti Sensory (Base 1st Digit)  ?Wrist    2.4 <3.1 17.8  Wrist Base 1st Digit 2.4 0.0    ?Left Ulnar Anti Sensory (5th Digit)  30.2?C  ?Wrist    3.7 <3.7 33.7 >15.0 Wrist 5th Digit 3.7 14.0 38 >38  ?Right Ulnar Anti Sensory (5th Digit)  ?Wrist    *4.2 <3.7 25.0 >15.0 Wrist 5th Digit 4.2 14.0 *33 >38  ? ?Motor Summary Table ? ? Stim Site NR Onset (ms) Norm Onset (ms) O-P Amp (mV) Norm O-P Amp Site1 Site2 Delta-0 (ms) Dist (cm) Vel (m/s) Norm Vel (m/s)  ?Left Median Motor (Abd Poll Brev)  30?C  ?Wrist    *9.5 <4.2 *3.6 >5 Elbow Wrist 5.4 20.0 *37 >50  ?Elbow    14.9  2.8         ?Right Median Motor (Abd Poll Brev)  ?Wrist    *4.8 <4.2 *0.1 >5 Elbow Wrist 10.1 20.0  *20 >50  ?Elbow    14.9  0.0         ?Left Ulnar Motor (Abd Dig Min)  30.2?C  ?Wrist    3.5 <4.2 7.9 >3 B Elbow Wrist 4.5 19.0 *42 >53  ?B Elbow    8.0  5.1  A Elbow B Elbow 1.5 10.0 67 >53  ?A Elbow    9.5  6.6         ?Right Ulnar Motor (Abd Dig Min)  ?Wrist    3.7 <4.2 8.4 >3 B Elbow Wrist 3.9 20.0 *51 >53  ?B Elbow    7.6  7.4  A Elbow B Elbow 1.2 10.0 83 >53  ?A Elbow    8.8  7.3         ? ?EMG ? ? Side Muscle Nerve  Root Ins Act Fibs Psw Amp Dur Poly Recrt Int Fraser Din Comment  ?Right Abd Poll Brev Median C8-T1 *Decr Nml *4+ Nml Nml 0 *Reduced Nml faint background MUAP  ?Right 1stDorInt Ulnar C8-T1 Nml Nml Nml Nml Nml 0 Nml Nml   ?Right PronatorTeres Median C6-7 Nml Nml Nml Nml Nml 0 Nml Nml   ?Right Biceps Musculocut C5-6 Nml Nml Nml Nml Nml 0 Nml Nml   ?Right Deltoid Axillary C5-6 Nml Nml Nml Nml Nml 0 Nml Nml   ? ? ?Nerve Conduction Studies ?Anti Sensory Left/Right Comparison ? ? Stim Site L Lat (ms) R Lat (ms) L-R Lat (ms) L Amp (?V) R Amp (?V) L-R Amp (%) Site1 Site2 L Vel (m/s) R Vel (m/s) L-R Vel (m/s)  ?Median Acr Palm Anti Sensory (2nd Digit)  30?C  ?Wrist *7.8   *6.7   Wrist Palm     ?Palm *5.5   2.5         ?Radial Anti Sensory (Base 1st Digit)  29.9?C  ?Wrist 2.4 2.4 0.0 19.5 17.8 8.7 Wrist Base 1st Digit     ?Ulnar Anti Sensory (5th Digit)  30.2?C  ?Wrist 3.7 *4.2 *0.5 33.7 25.0 25.8 Wrist 5th Digit 38 *33 5  ? ?Motor Left/Right Comparison ? ? Stim Site L Lat (ms) R Lat (ms) L-R Lat (ms) L Amp (mV) R Amp (mV) L-R Amp (%) Site1 Site2 L Vel (m/s) R Vel (m/s) L-R Vel (m/s)  ?Median Motor (Abd Poll Brev)  30?C  ?Wrist *9.5 *4.8 *4.7 *3.6 *0.1 *97.2 Elbow Wrist *37 *20 *17  ?Elbow 14.9 14.9 0.0 2.8 0.0 100.0       ?Ulnar Motor (Abd Dig Min)  30.2?C  ?Wrist 3.5 3.7 0.2 7.9 8.4 6.0 B Elbow Wrist *42 *51 9  ?B Elbow 8.0 7.6 0.4 5.1 7.4 31.1 A Elbow B Elbow 67 83 16  ?A Elbow 9.5 8.8 0.7 6.6 7.3 9.6       ? ? ? ?Waveforms: ?    ? ?    ? ?    ? ?  ? ?  ? ?Clinical History: ?MRI cervical spine (without)   ?TECHNIQUE: MRI of the cervical spine was obtained utilizing 3 mm sagittal slices from the posterior fossa down to the T3-4 level with T1, T2 and inversion recovery views. In addition 4 mm axial slices from O1-6 d

## 2022-02-24 ENCOUNTER — Other Ambulatory Visit: Payer: Self-pay

## 2022-02-24 ENCOUNTER — Ambulatory Visit: Payer: Medicare Other | Admitting: Orthopaedic Surgery

## 2022-02-24 DIAGNOSIS — L570 Actinic keratosis: Secondary | ICD-10-CM | POA: Diagnosis not present

## 2022-02-24 DIAGNOSIS — Z1211 Encounter for screening for malignant neoplasm of colon: Secondary | ICD-10-CM

## 2022-02-24 DIAGNOSIS — G5601 Carpal tunnel syndrome, right upper limb: Secondary | ICD-10-CM | POA: Diagnosis not present

## 2022-02-24 DIAGNOSIS — M1A9XX1 Chronic gout, unspecified, with tophus (tophi): Secondary | ICD-10-CM | POA: Diagnosis not present

## 2022-02-24 LAB — POC HEMOCCULT BLD/STL (HOME/3-CARD/SCREEN)
Card #2 Fecal Occult Blod, POC: NEGATIVE
Card #3 Fecal Occult Blood, POC: NEGATIVE
Fecal Occult Blood, POC: NEGATIVE

## 2022-02-24 NOTE — Progress Notes (Signed)
The patient is here for follow-up after having bilateral upper extremity nerve conduction studies.  He is 71 years old.  He has significant numbness and tingling in both of his hands.  His right seems to be worse than his left and he is right-hand dominant. ? ?The nerve conduction studies do show severe carpal tunnel syndrome with the level of disease at the transverse carpal ligament bilaterally. ? ?He does have positive Phalen's and Tinel signs bilaterally.  There is no muscle atrophy.  There is only slight weak strength. ? ?I talked about the anatomy of the transverse carpal ligament and described what surgery involves in detail.  We have recommended an open carpal tunnel release.  We would proceed with the right side first.  He agrees with this.  All questions and concerns were answered and addressed.  We will work on scheduling surgery and then we will see him back in 2 weeks postoperative. ?

## 2022-02-25 ENCOUNTER — Other Ambulatory Visit: Payer: Self-pay | Admitting: Adult Health

## 2022-02-25 DIAGNOSIS — Z1211 Encounter for screening for malignant neoplasm of colon: Secondary | ICD-10-CM | POA: Diagnosis not present

## 2022-02-25 DIAGNOSIS — Z1212 Encounter for screening for malignant neoplasm of rectum: Secondary | ICD-10-CM | POA: Diagnosis not present

## 2022-02-26 DIAGNOSIS — E782 Mixed hyperlipidemia: Secondary | ICD-10-CM | POA: Diagnosis not present

## 2022-02-26 DIAGNOSIS — N1831 Chronic kidney disease, stage 3a: Secondary | ICD-10-CM | POA: Diagnosis not present

## 2022-02-26 DIAGNOSIS — M1000 Idiopathic gout, unspecified site: Secondary | ICD-10-CM | POA: Diagnosis not present

## 2022-02-26 DIAGNOSIS — I10 Essential (primary) hypertension: Secondary | ICD-10-CM | POA: Diagnosis not present

## 2022-03-03 ENCOUNTER — Encounter: Payer: Self-pay | Admitting: Internal Medicine

## 2022-03-04 ENCOUNTER — Ambulatory Visit (INDEPENDENT_AMBULATORY_CARE_PROVIDER_SITE_OTHER): Payer: Medicare Other | Admitting: Internal Medicine

## 2022-03-04 ENCOUNTER — Encounter: Payer: Self-pay | Admitting: Internal Medicine

## 2022-03-04 VITALS — BP 148/80 | HR 65 | Temp 97.8°F | Resp 17 | Ht 67.0 in | Wt 201.8 lb

## 2022-03-04 DIAGNOSIS — J0140 Acute pansinusitis, unspecified: Secondary | ICD-10-CM | POA: Diagnosis not present

## 2022-03-04 DIAGNOSIS — J029 Acute pharyngitis, unspecified: Secondary | ICD-10-CM | POA: Diagnosis not present

## 2022-03-04 MED ORDER — DEXAMETHASONE 4 MG PO TABS: ORAL_TABLET | ORAL | 0 refills | Status: DC

## 2022-03-04 MED ORDER — AZITHROMYCIN 250 MG PO TABS: ORAL_TABLET | ORAL | 1 refills | Status: DC

## 2022-03-04 NOTE — Progress Notes (Signed)
? ? ?Future Appointments  ?Date Time Provider Department  ?03/25/2022 10:30 AM Liane Comber, NP [facility]  ?06/09/2022 11:00 AM Ward Givens, NP [facility]  ?06/23/2022 10:30 AM [name], MD [facility]  ?12/23/2022  2:00 PM [name], MD [facility]  ? ? ?History of Present Illness: ? ?    Patient is a very nice 71 yo MWM with 5 day prodrome of sinus congestion , PND & S/T. Occasionally has had some blood tinged sinus drainage . Also has had occasional productive cough / sputum. ? ? ?Medications ? ?  bisoprolol (ZEBETA) 10 MG tablet, TAKE 1 TABLET BY MOUTH EVERY DAY FOR BLOOD PRESSURE ?  rosuvastatin (CRESTOR) 40 MG tablet, TAKE 1 TABLET BY MOUTH EVERY DAY FOR CHOLESTEROL ?  telmisartan (MICARDIS) 80 MG tablet, TAKE 1 TABLET BY MOUTH EVERY DAY FOR BLOOD PRESSURE ?  acetaminophen (TYLENOL) 650 MG CR tablet, Take 650 mg by mouth every 8 (eight) hours as needed for pain. ?  meloxicam (MOBIC) 15 MG tablet, TAKE 1 TABLET BY MOUTH DAILY AS NEEDED WITH FOOD FOR PAIN & INFLAMMATION ?  Cholecalciferol (VITAMIN D3) 125 MCG (5000 UT) CAPS, Take 10,000 Units by mouth every morning. ?  clonazePAM (KLONOPIN) 1 MG tablet, Take 0.5-1 tablets (0.5-1 mg total) by mouth at bedtime as needed (restless leg syndrome). ?  gabapentin (NEURONTIN) 600 MG tablet, TAKE 1/2 TO 1 TABLET 2 TO 3 TIMES DAILY AS NEEDED FOR NEUROPATHY PAIN (Patient taking differently: Take 600 mg by mouth 2 (two) times daily. FOR NEUROPATHY PAIN) ?  Krill Oil 350 MG CAPS, Take 350 mg by mouth every morning. ?  Magnesium 400 MG CAPS, Take 800 mg by mouth every morning. ?  PRESCRIPTION MEDICATION, Inhale into the lungs at bedtime. CPAP ?  TURMERIC PO, Take 2,600 mg by mouth in the morning and at bedtime. ? ?Problem list ?He has Essential hypertension; Hyperlipidemia, mixed; Abnormal glucose; OSA and COPD overlap syndrome ([place]); Vitamin D deficiency; DJD (degenerative joint disease); Primary gout; ED (erectile dysfunction); Morbid obesity ([place]); Former  smoker (45+ pack year history, quit 02/2021); COPD (chronic obstructive pulmonary disease) ([place]); Aortic atherosclerosis ([facility]) by Chest CT on 12.18/2020; Persistent proteinuria; Coronary artery calcification seen on CT scan; PLMD (periodic limb movement disorder); CPAP (continuous positive airway pressure) dependence; Neuropathy; Chronic intermittent hypoxia with obstructive sleep apnea; CKD (chronic kidney disease), stage III ([place]); Septic olecranon bursitis of left elbow; Abnormal CT of liver; and Carpal tunnel syndrome, right upper limb on their problem list. ?  ?Observations/Objective: ? ?BP (!) 148/80   Pulse 65   Temp 97.8 ?F (36.6 ?C)   Resp 17   Ht '5\' 7"'$  (1.702 m)   Wt 201 lb 12.8 oz (91.5 kg)   SpO2 96%   BMI 31.61 kg/m?  ? ?HEENT -  EACs / TMs- Nl. (+) tender fronto-maxillary sinuses.  ?                 - O/P  2(+) injected w/o exudates.  ?Neck - supple. O sig cx LNs.  ?Chest - Clear equal BS. ?Cor - Nl HS. RRR w/o sig MGR. PP 1(+). No edema. ?MS- FROM w/o deformities.  Gait Nl. ?Neuro -  Nl w/o focal abnormalities. ? ? ?Assessment and Plan: ? ?1. Pharyngitis ? ?2. Acute pansinusitis ? ?- dexamethasone  4 MG tablet;  ?Take 1 tab 3 x /day for 2 days, then 2 x /day for 2  Days,  then 1 tab daily   ?Dispense: 13 tablet; Refill: 0 ?-  azithromycin (ZITHROMAX) 250 MG tablet; Take 2 tablets with Food on  Day 1, then 1 tablet Daily with Food for Sinusitis / Bronchitis  Dispense: 6 each; Refill: 1 ? ?Follow Up Instructions: ? ?  ?    I discussed the assessment and treatment plan with the patient. The patient was provided an opportunity to ask questions and all were answered. The patient agreed with the plan and demonstrated an understanding of the instructions. ?  ?    The patient was advised to call back or seek an in-person evaluation if the symptoms worsen or if the condition fails to improve as anticipated. ? ? ? ?Kirtland Bouchard, MD ? ?

## 2022-03-12 ENCOUNTER — Encounter: Payer: Self-pay | Admitting: Internal Medicine

## 2022-03-12 ENCOUNTER — Other Ambulatory Visit: Payer: Self-pay | Admitting: Internal Medicine

## 2022-03-12 DIAGNOSIS — G4733 Obstructive sleep apnea (adult) (pediatric): Secondary | ICD-10-CM | POA: Diagnosis not present

## 2022-03-12 MED ORDER — DEXAMETHASONE 4 MG PO TABS: ORAL_TABLET | ORAL | 0 refills | Status: DC

## 2022-03-18 ENCOUNTER — Other Ambulatory Visit: Payer: Self-pay | Admitting: Orthopaedic Surgery

## 2022-03-18 DIAGNOSIS — G5601 Carpal tunnel syndrome, right upper limb: Secondary | ICD-10-CM | POA: Diagnosis not present

## 2022-03-18 MED ORDER — HYDROCODONE-ACETAMINOPHEN 5-325 MG PO TABS: 1.0000 | ORAL_TABLET | Freq: Four times a day (QID) | ORAL | 0 refills | Status: DC | PRN

## 2022-03-19 ENCOUNTER — Telehealth: Payer: Self-pay

## 2022-03-19 ENCOUNTER — Other Ambulatory Visit: Payer: Self-pay | Admitting: Orthopaedic Surgery

## 2022-03-19 MED ORDER — HYDROCODONE-ACETAMINOPHEN 5-325 MG PO TABS: 1.0000 | ORAL_TABLET | Freq: Four times a day (QID) | ORAL | 0 refills | Status: DC | PRN

## 2022-03-19 NOTE — Telephone Encounter (Signed)
Called pt and advised. He stated understanding  °

## 2022-03-19 NOTE — Telephone Encounter (Signed)
Can you send hydrocodone to [place]? ?[facility] is on back order ?

## 2022-03-25 ENCOUNTER — Ambulatory Visit (INDEPENDENT_AMBULATORY_CARE_PROVIDER_SITE_OTHER): Payer: Medicare Other | Admitting: Nurse Practitioner

## 2022-03-25 ENCOUNTER — Encounter: Payer: Self-pay | Admitting: Nurse Practitioner

## 2022-03-25 VITALS — BP 110/68 | HR 65 | Temp 97.9°F | Resp 16 | Ht 67.0 in | Wt 205.2 lb

## 2022-03-25 DIAGNOSIS — E782 Mixed hyperlipidemia: Secondary | ICD-10-CM

## 2022-03-25 DIAGNOSIS — N1831 Chronic kidney disease, stage 3a: Secondary | ICD-10-CM

## 2022-03-25 DIAGNOSIS — Z0000 Encounter for general adult medical examination without abnormal findings: Secondary | ICD-10-CM | POA: Diagnosis not present

## 2022-03-25 DIAGNOSIS — I10 Essential (primary) hypertension: Secondary | ICD-10-CM | POA: Diagnosis not present

## 2022-03-25 DIAGNOSIS — M1000 Idiopathic gout, unspecified site: Secondary | ICD-10-CM

## 2022-03-25 DIAGNOSIS — G4733 Obstructive sleep apnea (adult) (pediatric): Secondary | ICD-10-CM

## 2022-03-25 DIAGNOSIS — G5601 Carpal tunnel syndrome, right upper limb: Secondary | ICD-10-CM

## 2022-03-25 DIAGNOSIS — E559 Vitamin D deficiency, unspecified: Secondary | ICD-10-CM

## 2022-03-25 DIAGNOSIS — I700 Atherosclerosis of aorta: Secondary | ICD-10-CM

## 2022-03-25 DIAGNOSIS — M1990 Unspecified osteoarthritis, unspecified site: Secondary | ICD-10-CM

## 2022-03-25 NOTE — Progress Notes (Signed)
MEDICARE WELLNESS ?Assessment:  ? ?1. Medicare annual wellness visit, subsequent ?Due Yearly ? ?2. Aortic atherosclerosis ([place]) by Chest CT on 12.18/2020 ?Continue statin. ?Lifestyle modifications. ?Weight loss, dietary modifications. ? ?3. OSA and COPD overlap syndrome ([place]) ?Continue CPAP ? ?4. Stage 3a chronic kidney disease ([place]) ?Avoid nephrotoxic agens ? ?5. Morbid obesity ([place]) ?Weight loss discussed. ?Lifestyle modifications. ? ?- COMPLETE METABOLIC PANEL WITH GFR ? ?6. Essential hypertension ?Continue medications ?Lifestyle modifications ?DASH Diet. ? ?- CBC with Differential/Platelet ?- COMPLETE METABOLIC PANEL WITH GFR ? ?7. Carpal tunnel syndrome, right upper limb ?Continue current pain management ?Gabapentin, Norco, Meloxicam ? ?8. Osteoarthritis, unspecified osteoarthritis type, unspecified site ?Weight loss advised. ?Continue current pain management  ?Gabapentin,  Norco, Meloxicam ? ?9. Hyperlipidemia, mixed ?Weight loss ?Lifestyle modifications ?Omega 3 Fish Oil  ? ?- Lipid panel ? ?10. Vitamin D deficiency ?Continue Cholecalciferol ?Continue to monitor.  ? ?11. Idiopathic gout, unspecified chronicity, unspecified site ?Discussed low purine diet. ?Possible restart of Allopurinol if uric acid levels elevated. ?Decadron ? ?- Uric acid ? ? ?Over 30 minutes of exam, counseling, chart review, and critical decision making was performed ?Future Appointments  ?Date Time Provider [place]  ?03/31/2022  9:15 AM Mcarthur Rossetti, MD [facility] None  ?06/09/2022 11:00 AM Ward Givens, NP [facility] None  ?06/30/2022  9:30 AM [name], MD [facility] None  ?12/23/2022  2:00 PM [name], MD [facility] None  ?03/28/2023 11:00 AM Darrol Jump, NP [facility] None  ? ? ?Plan:  ? ?During the course of the visit the patient was educated and counseled about appropriate screening and preventive services including:  ? ?Pneumococcal vaccine  ?Influenza vaccine ?Prevnar 13 ?Td vaccine ?Screening  electrocardiogram ?Colorectal cancer screening ?Diabetes screening ?Glaucoma screening ?Nutrition counseling  ? ? ?Subjective:  ?Stephen Holland is a 71 y.o. male who presents for Medicare Annual Wellness Visit and 3 month follow up for HTN, hyperlipidemia, prediabetes, and vitamin D Def.  ? ?Reports decline in hearing, however, he is not interested  in getting ears checked  ? ?Reports a gout attack 2 days ago.  He is no longer taking Allopurinol.  Takes meloxicam and Norco PRN.  Trying to monitor diet.  Has history of back pain/neck pain with DDD, has been getting injections, not helping, suggested surgery but the patient is not interested.  ? ?Still working 2 days a week trucking.  ? ?Patient is smoker. He does smoke, 50+ pack  Year smoking history, chantix did not help, him and his wife are interested in quitting.  Denies vision changes, speech changes, dizziness.  ? ?He has COPD, not on meds at this time. He has OSA.  ? ?HE was having DOE, saw cardiology, had CT coronary 04/2020, showed non obstructive CAD. ? ?He was having proteinuria, had normal protein electrophoresis urine and normal work up, he is on ARB.  ? ?Right carapel tunnel, follows up on 03/30/22. ? ? ?His blood pressure has been controlled at home, today their BP is BP: 110/68 ? He does not workout. He denies chest pain, shortness of breath, dizziness. ?BMI is Body mass index is 32.14 kg/m?Marland Kitchen He admits to eating too much.  ? ?Wt Readings from Last 3 Encounters:  ?03/25/22 205 lb 3.2 oz (93.1 kg)  ?03/04/22 201 lb 12.8 oz (91.5 kg)  ?12/21/21 202 lb 3.2 oz (91.7 kg)  ? ?Takes klonopin for sleep but will take every other day.  ? He is on cholesterol medication and denies myalgias. His cholesterol is at goal  however triglycerides are elevated. The cholesterol last visit was:   ?Lab Results  ?Component Value Date  ? CHOL 132 12/21/2021  ? HDL 40 12/21/2021  ? [place] 62 12/21/2021  ? TRIG 258 (H) 12/21/2021  ? CHOLHDL 3.3 12/21/2021  ? ? He has been working  on diet and exercise for prediabetes, and denies paresthesia of the feet, polydipsia, polyuria and visual disturbances.  ?Last A1C in the office was:  ?Lab Results  ?Component Value Date  ? HGBA1C 5.4 12/21/2021  ? ?Lab Results  ?Component Value Date  ? GFRNONAA 44 (L) 03/25/2021  ? ?Patient is on Vitamin D supplement.   ?Lab Results  ?Component Value Date  ? VD25OH 88 12/21/2021  ?   ?Patient is on allopurinol for gout and  reports a recent flare.  ?Lab Results  ?Component Value Date  ? LABURIC 7.7 12/21/2021  ? ?Medication Review: ? ?Current Outpatient Medications (Endocrine & Metabolic):  ?  dexamethasone (DECADRON) 4 MG tablet, Take 1 tab 3 x day - 3 days, then 2 x day - 3 days, then 1 tab daily (Patient not taking: Reported on 03/25/2022) ? ?Current Outpatient Medications (Cardiovascular):  ?  bisoprolol (ZEBETA) 10 MG tablet, TAKE 1 TABLET BY MOUTH EVERY DAY FOR BLOOD PRESSURE ?  rosuvastatin (CRESTOR) 40 MG tablet, TAKE 1 TABLET BY MOUTH EVERY DAY FOR CHOLESTEROL ?  telmisartan (MICARDIS) 80 MG tablet, TAKE 1 TABLET BY MOUTH EVERY DAY FOR BLOOD PRESSURE ? ? ?Current Outpatient Medications (Analgesics):  ?  acetaminophen (TYLENOL) 650 MG CR tablet, Take 650 mg by mouth every 8 (eight) hours as needed for pain. ?  HYDROcodone-acetaminophen (NORCO/VICODIN) 5-325 MG tablet, Take 1-2 tablets by mouth every 6 (six) hours as needed for moderate pain. ?  meloxicam (MOBIC) 15 MG tablet, TAKE 1 TABLET BY MOUTH DAILY AS NEEDED WITH FOOD FOR PAIN & INFLAMMATION ? ? ?Current Outpatient Medications (Other):  ?  Cholecalciferol (VITAMIN D3) 125 MCG (5000 UT) CAPS, Take 10,000 Units by mouth every morning. ?  clonazePAM (KLONOPIN) 1 MG tablet, Take 0.5-1 tablets (0.5-1 mg total) by mouth at bedtime as needed (restless leg syndrome). ?  gabapentin (NEURONTIN) 600 MG tablet, TAKE 1/2 TO 1 TABLET 2 TO 3 TIMES DAILY AS NEEDED FOR NEUROPATHY PAIN (Patient taking differently: Take 600 mg by mouth 2 (two) times daily. FOR NEUROPATHY  PAIN) ?  Krill Oil 350 MG CAPS, Take 350 mg by mouth every morning. ?  Magnesium 400 MG CAPS, Take 800 mg by mouth every morning. ?  PRESCRIPTION MEDICATION, Inhale into the lungs at bedtime. CPAP ?  TURMERIC PO, Take 2,600 mg by mouth in the morning and at bedtime. ?  azithromycin (ZITHROMAX) 250 MG tablet, Take 2 tablets with Food on  Day 1, then 1 tablet Daily with Food for Sinusitis / Bronchitis (Patient not taking: Reported on 03/25/2022) ? ?Current Problems (verified) ?Patient Active Problem List  ? Diagnosis Date Noted  ? Carpal tunnel syndrome, right upper limb 02/24/2022  ? Abnormal CT of liver 04/24/2021  ? CKD (chronic kidney disease), stage III ([place]) 03/03/2021  ? Septic olecranon bursitis of left elbow   ? Chronic intermittent hypoxia with obstructive sleep apnea 02/10/2021  ? PLMD (periodic limb movement disorder) 01/08/2021  ? CPAP (continuous positive airway pressure) dependence 01/08/2021  ? Neuropathy 01/08/2021  ? Coronary artery calcification seen on CT scan 03/19/2020  ? Persistent proteinuria 02/22/2020  ? Aortic atherosclerosis ([place]) by Chest CT on 12.18/2020 06/30/2017  ? COPD (chronic obstructive  pulmonary disease) ([place]) 06/13/2017  ? Morbid obesity ([place]) 08/26/2014  ? Former smoker (45+ pack year history, quit 02/2021) 08/26/2014  ? Essential hypertension   ? Hyperlipidemia, mixed   ? Abnormal glucose   ? OSA and COPD overlap syndrome ([place])   ? Vitamin D deficiency   ? DJD (degenerative joint disease)   ? Primary gout   ? ED (erectile dysfunction)   ? ? ?Screening Tests ?Immunization History  ?Administered Date(s) Administered  ? Fluad Quad(high Dose 65+) 10/03/2020  ? Influenza Split 08/26/2014  ? Influenza, High Dose Seasonal PF 08/12/2016, 10/17/2018, 09/05/2019, 09/30/2021  ? Influenza, Seasonal, Injecte, Preservative Fre 10/27/2015  ? PFIZER(Purple Top)SARS-COV-2 Vaccination 01/05/2020, 01/26/2020, 09/27/2020  ? PPD Test 05/23/2014, 07/23/2015  ? Pneumococcal Conjugate-13 08/26/2014  ?  Pneumococcal Polysaccharide-23 05/05/2010, 01/30/2016  ? Td 11/30/2003  ? Tdap 08/12/2010, 08/21/2020, 03/01/2021  ? Zoster, Live 04/29/2013  ? ?Health Maintenance  ?Topic Date Due  ? Zoster Vaccines- Vernard Gambles

## 2022-03-26 LAB — CBC WITH DIFFERENTIAL/PLATELET
Absolute Monocytes: 994 cells/uL — ABNORMAL HIGH (ref 200–950)
Basophils Absolute: 14 cells/uL (ref 0–200)
Basophils Relative: 0.1 %
Eosinophils Absolute: 166 cells/uL (ref 15–500)
Eosinophils Relative: 1.2 %
HCT: 49.1 % (ref 38.5–50.0)
Hemoglobin: 16.5 g/dL (ref 13.2–17.1)
Lymphs Abs: 1960 cells/uL (ref 850–3900)
MCH: 32.2 pg (ref 27.0–33.0)
MCHC: 33.6 g/dL (ref 32.0–36.0)
MCV: 95.7 fL (ref 80.0–100.0)
MPV: 11.3 fL (ref 7.5–12.5)
Monocytes Relative: 7.2 %
Neutro Abs: 10667 cells/uL — ABNORMAL HIGH (ref 1500–7800)
Neutrophils Relative %: 77.3 %
Platelets: 92 10*3/uL — ABNORMAL LOW (ref 140–400)
RBC: 5.13 10*6/uL (ref 4.20–5.80)
RDW: 13.3 % (ref 11.0–15.0)
Total Lymphocyte: 14.2 %
WBC: 13.8 10*3/uL — ABNORMAL HIGH (ref 3.8–10.8)

## 2022-03-26 LAB — COMPLETE METABOLIC PANEL WITH GFR
AG Ratio: 1.7 (calc) (ref 1.0–2.5)
ALT: 35 U/L (ref 9–46)
AST: 17 U/L (ref 10–35)
Albumin: 3.6 g/dL (ref 3.6–5.1)
Alkaline phosphatase (APISO): 49 U/L (ref 35–144)
BUN/Creatinine Ratio: 25 (calc) — ABNORMAL HIGH (ref 6–22)
BUN: 40 mg/dL — ABNORMAL HIGH (ref 7–25)
CO2: 31 mmol/L (ref 20–32)
Calcium: 9.4 mg/dL (ref 8.6–10.3)
Chloride: 105 mmol/L (ref 98–110)
Creat: 1.58 mg/dL — ABNORMAL HIGH (ref 0.70–1.28)
Globulin: 2.1 g/dL (calc) (ref 1.9–3.7)
Glucose, Bld: 91 mg/dL (ref 65–99)
Potassium: 5.6 mmol/L — ABNORMAL HIGH (ref 3.5–5.3)
Sodium: 142 mmol/L (ref 135–146)
Total Bilirubin: 0.8 mg/dL (ref 0.2–1.2)
Total Protein: 5.7 g/dL — ABNORMAL LOW (ref 6.1–8.1)
eGFR: 46 mL/min/{1.73_m2} — ABNORMAL LOW (ref >=60)

## 2022-03-26 LAB — LIPID PANEL
Cholesterol: 142 mg/dL (ref <200)
HDL: 61 mg/dL (ref >=40)
LDL Cholesterol (Calc): 58 mg/dL (calc)
Non-HDL Cholesterol (Calc): 81 mg/dL (calc) (ref <130)
Total CHOL/HDL Ratio: 2.3 (calc) (ref <5.0)
Triglycerides: 157 mg/dL — ABNORMAL HIGH (ref <150)

## 2022-03-26 LAB — URIC ACID: Uric Acid, Serum: 6.4 mg/dL (ref 4.0–8.0)

## 2022-03-29 ENCOUNTER — Other Ambulatory Visit: Payer: Self-pay | Admitting: Nurse Practitioner

## 2022-03-29 DIAGNOSIS — D72828 Other elevated white blood cell count: Secondary | ICD-10-CM

## 2022-03-29 DIAGNOSIS — E875 Hyperkalemia: Secondary | ICD-10-CM

## 2022-03-31 ENCOUNTER — Ambulatory Visit (INDEPENDENT_AMBULATORY_CARE_PROVIDER_SITE_OTHER): Payer: Medicare Other | Admitting: Orthopaedic Surgery

## 2022-03-31 ENCOUNTER — Encounter: Payer: Self-pay | Admitting: Orthopaedic Surgery

## 2022-03-31 ENCOUNTER — Ambulatory Visit: Payer: Medicare Other

## 2022-03-31 DIAGNOSIS — E875 Hyperkalemia: Secondary | ICD-10-CM

## 2022-03-31 DIAGNOSIS — D72828 Other elevated white blood cell count: Secondary | ICD-10-CM

## 2022-03-31 DIAGNOSIS — Z9889 Other specified postprocedural states: Secondary | ICD-10-CM

## 2022-03-31 NOTE — Progress Notes (Signed)
? ?  HPI: Mr. Disney returns today status post right carpal tunnel release 03/18/2022.  He states his fingers are still numb and tingly but he is not having the severe paresthesias he was having prior to surgery.  Again he had severe carpal tunnel syndrome by EMG nerve conduction studies.  He states he had very little pain. ? ?Physical exam: ?Right hand stitches well approximated the incision however the incision does not appear completely healed at this point time there is no drainage.  No signs of gross infection.  Sensation grossly intact throughout the right hand to light touch.  He has significant bruising over the volar aspect of the wrist and hand.  Full range of motion of all fingers. ? ?Impression: Status post right carpal tunnel release 03/18/2022 ? ?Plan: He will continue work on range of motion of the hand.  He will wash the hand with antibacterial soap.  Otherwise keep the hand clean he will avoid heavy lifting.  Follow-up with [place] in 1 week for suture removal.  Questions encouraged and answered at length. ? ? ? ? ? ? ? ? ? ? ? ? ? ? ? ? ? ? ? ? ? ? ? ? ? ? ? ? ? ? ? ? ? ? ? ? ? ? ? ? ? ? ? ? ? ? ? ? ? ? ? ? ? ? ? ? ? ? ? ? ? ? ? ? ? ? ? ? ? ? ? ? ? ? ? ? ? ? ? ? ? ? ? ? ? ? ? ? ? ? ? ? ? ? ? ? ? ? ? ? ? ? ? ? ? ? ? ? ? ? ? ? ? ? ? ? ? ? ? ? ? ? ? ? ? ? ? ? ? ? ? ? ? ? ? ? ? ? ? ? ? ? ? ? ? ? ? ? ? ? ? ? ? ? ? ? ? ? ? ? ? ? ? ? ? ? ? ? ? ? ? ? ? ? ? ? ? ? ? ? ? ? ? ? ? ? ? ? ? ? ? ? ? ? ? ? ? ? ? ? ? ? ? ? ? ? ? ? ? ? ? ? ? ? ? ? ? ? ? ? ? ? ? ? ? ? ? ? ? ? ? ? ? ? ? ? ? ? ? ? ? ? ? ? ? ? ? ? ? ? ? ? ? ? ? ? ? ? ? ? ? ? ? ? ? ? ? ? ? ? ? ? ?

## 2022-03-31 NOTE — Progress Notes (Signed)
The patient returns today for repeat blood work. The patient lists no complaints today.  ?

## 2022-04-01 LAB — CBC WITH DIFFERENTIAL/PLATELET
Absolute Monocytes: 1066 cells/uL — ABNORMAL HIGH (ref 200–950)
Basophils Absolute: 49 cells/uL (ref 0–200)
Basophils Relative: 0.6 %
Eosinophils Absolute: 254 cells/uL (ref 15–500)
Eosinophils Relative: 3.1 %
HCT: 45 % (ref 38.5–50.0)
Hemoglobin: 15.5 g/dL (ref 13.2–17.1)
Lymphs Abs: 1656 cells/uL (ref 850–3900)
MCH: 32.2 pg (ref 27.0–33.0)
MCHC: 34.4 g/dL (ref 32.0–36.0)
MCV: 93.6 fL (ref 80.0–100.0)
MPV: 10.3 fL (ref 7.5–12.5)
Monocytes Relative: 13 %
Neutro Abs: 5174 cells/uL (ref 1500–7800)
Neutrophils Relative %: 63.1 %
Platelets: 131 10*3/uL — ABNORMAL LOW (ref 140–400)
RBC: 4.81 10*6/uL (ref 4.20–5.80)
RDW: 13.1 % (ref 11.0–15.0)
Total Lymphocyte: 20.2 %
WBC: 8.2 10*3/uL (ref 3.8–10.8)

## 2022-04-01 LAB — COMPLETE METABOLIC PANEL WITH GFR
AG Ratio: 1.1 (calc) (ref 1.0–2.5)
ALT: 59 U/L — ABNORMAL HIGH (ref 9–46)
AST: 48 U/L — ABNORMAL HIGH (ref 10–35)
Albumin: 3.4 g/dL — ABNORMAL LOW (ref 3.6–5.1)
Alkaline phosphatase (APISO): 77 U/L (ref 35–144)
BUN: 22 mg/dL (ref 7–25)
CO2: 23 mmol/L (ref 20–32)
Calcium: 9.1 mg/dL (ref 8.6–10.3)
Chloride: 105 mmol/L (ref 98–110)
Creat: 1.11 mg/dL (ref 0.70–1.28)
Globulin: 3.1 g/dL (calc) (ref 1.9–3.7)
Glucose, Bld: 89 mg/dL (ref 65–99)
Potassium: 4.3 mmol/L (ref 3.5–5.3)
Sodium: 139 mmol/L (ref 135–146)
Total Bilirubin: 0.6 mg/dL (ref 0.2–1.2)
Total Protein: 6.5 g/dL (ref 6.1–8.1)
eGFR: 71 mL/min/{1.73_m2} (ref >=60)

## 2022-04-06 ENCOUNTER — Telehealth: Payer: Self-pay

## 2022-04-06 NOTE — Telephone Encounter (Signed)
LM-04/06/22-Calling pt. To schedule f/u w/ CP for July 5th. Spoke with pt. And scheduled f/u w/ CP for 7/5 at 9am. ? ?Total time spent: 5 min. ?

## 2022-04-07 ENCOUNTER — Encounter: Payer: Self-pay | Admitting: Orthopaedic Surgery

## 2022-04-07 ENCOUNTER — Ambulatory Visit (INDEPENDENT_AMBULATORY_CARE_PROVIDER_SITE_OTHER): Payer: Medicare Other | Admitting: Orthopaedic Surgery

## 2022-04-07 DIAGNOSIS — Z9889 Other specified postprocedural states: Secondary | ICD-10-CM

## 2022-04-07 MED ORDER — DOXYCYCLINE HYCLATE 100 MG PO TABS: 100.0000 mg | ORAL_TABLET | Freq: Two times a day (BID) | ORAL | 0 refills | Status: DC

## 2022-04-07 NOTE — Progress Notes (Signed)
The patient is following up status post a right open carpal tunnel release.  The sutures have been kept in a week since they did not like ready to come out last week.  On exam today, there is some redness about the palm and I removed all sutures.  There is some slight wound dehiscence and there is a small amount of purulence.  He has been afebrile and denies any pain. ? ?I am and have him soak his hand daily and antibacterial soapy water for 15 minutes and then place Bactroban ointment only incision.  I will send in some doxycycline as well.  He will take that twice a day.  I will see him back in 1 week for a wound evaluation.  If things worsen he knows to let us know. ?

## 2022-04-15 ENCOUNTER — Encounter: Payer: Self-pay | Admitting: Orthopaedic Surgery

## 2022-04-15 ENCOUNTER — Ambulatory Visit (INDEPENDENT_AMBULATORY_CARE_PROVIDER_SITE_OTHER): Payer: Medicare Other | Admitting: Orthopaedic Surgery

## 2022-04-15 DIAGNOSIS — Z9889 Other specified postprocedural states: Secondary | ICD-10-CM

## 2022-04-15 NOTE — Progress Notes (Signed)
The patient is about 3 weeks status post a right carpal tunnel release.  I remove the sutures last week and I was worried about potential for infection.  We started him on doxycycline and him soaking it daily.  He is here for a wound recheck today.  He said he is doing much better overall.  On exam his right hand incision does look better overall.  There is no gross purulence and the redness has dissipated.  He does have still numbness to be expected due to the severity of his carpal tunnel syndrome.  He has known left carpal tunnel syndrome as well.  We will see him back in 6 weeks to see how his right hand is doing and to start talking about when to proceed with a left open carpal tunnel release.  All questions and concerns were answered and addressed.

## 2022-04-19 ENCOUNTER — Other Ambulatory Visit: Payer: Self-pay | Admitting: Nurse Practitioner

## 2022-04-19 DIAGNOSIS — G2581 Restless legs syndrome: Secondary | ICD-10-CM

## 2022-05-26 DIAGNOSIS — G4733 Obstructive sleep apnea (adult) (pediatric): Secondary | ICD-10-CM | POA: Diagnosis not present

## 2022-05-27 ENCOUNTER — Encounter: Payer: Self-pay | Admitting: Orthopaedic Surgery

## 2022-05-27 ENCOUNTER — Ambulatory Visit (INDEPENDENT_AMBULATORY_CARE_PROVIDER_SITE_OTHER): Payer: Medicare Other | Admitting: Orthopaedic Surgery

## 2022-05-27 DIAGNOSIS — Z9889 Other specified postprocedural states: Secondary | ICD-10-CM

## 2022-05-27 NOTE — Progress Notes (Signed)
The patient is following up about 9 weeks after right open carpal tunnel release.  He has severe carpal tunnel syndrome bilaterally.  He is 71 years old.  He is still having some discomfort and decree sensation which is to be expected given the severity of his carpal tunnel syndrome.  On exam his incision looks much better.  There is only scar tissue that can be massage.  He still lacks full grip strength and full pinch strength and is very weak in that hand.  He lacks full extension and flexion of his fingers as well.  We talked about getting a firm ball that he can work on squeezing and getting his fingers bending and moving.  Offered outpatient physical therapy but we both feel that a lot of this can be done on his own.  We will see him back in 3 months to see how he is doing overall.  We will hold off on his left open carpal tunnel release until he gets better recovered from the right side.

## 2022-05-28 ENCOUNTER — Telehealth: Payer: Self-pay

## 2022-05-28 NOTE — Telephone Encounter (Signed)
LM-05/28/22-Calling pt. To confirm CP phone visit scheduled for 7/5 at 9:00AM.  Total time spent: 2 min

## 2022-05-31 NOTE — Telephone Encounter (Signed)
LM-05/31/22-Called pt. And confirmed CP f/u phone visit for 7/5 at 9:00AM. Pt. Stated he may have to work but at this time confirmed visit. Provided pt. W/ contact number [phone] to call if he needs to r/s. Pt. Verbalized understanding and agreed. Precall questions completed. Updated chart prep.  Total time spent: 12 min.

## 2022-06-02 ENCOUNTER — Telehealth: Payer: Self-pay

## 2022-06-02 ENCOUNTER — Ambulatory Visit: Payer: Medicare Other | Admitting: Pharmacy Technician

## 2022-06-02 DIAGNOSIS — N1831 Chronic kidney disease, stage 3a: Secondary | ICD-10-CM

## 2022-06-02 DIAGNOSIS — I10 Essential (primary) hypertension: Secondary | ICD-10-CM

## 2022-06-02 NOTE — Telephone Encounter (Signed)
LM-06/02/22-Uploaded CP visit notes to pts. Documents. Called pt. To schedule CP focused outreach. Pt. Stated he was driving and requested I call back on Friday to schedule visit. Put a note on schedule for potential F/u visit for 10/4 9:00AM Wednesday-pt. Is unavailable on Tuesday's. Will reach out to pt. on 7/7 to confirm/schedule focused outreach.    Total time spent: 10 min.

## 2022-06-02 NOTE — Telephone Encounter (Signed)
LM-06/02/22-Uploaded CP visit notes to pts. Documents. Called pt. To schedule CP focused outreach. Pt. Stated he was driving and requested I call back on Friday to schedule visit. Put a note on schedule for potential F/u visit for 10/4 9:00AM Wednesday-pt. Is unavailable on Tuesday's. Will reach out to pt. on 7/7 to confirm/schedule focused outreach.       Total time spent: 10 min.             May 31, 2022 Me      05/31/22 12:19 PM Note LM-05/31/22-Called pt. And confirmed CP f/u phone visit for 7/5 at 9:00AM. Pt. Stated he may have to work but at this time confirmed visit. Provided pt. W/ contact number [phone] to call if he needs to r/s. Pt. Verbalized understanding and agreed. Precall questions completed. Updated chart prep.   Total time spent: 12 min.

## 2022-06-07 NOTE — Telephone Encounter (Signed)
LM-06/07/22-Called pt. And scheduled focused outreach w/ CP for 10/4 at 9:00AM via phone call.  Total time spent:5 min.

## 2022-06-08 NOTE — Progress Notes (Unsigned)
PATIENT: Stephen Holland DOB: [birth year]  REASON FOR VISIT: follow up HISTORY FROM: patient PRIMARY NEUROLOGIST: Dr. Vickey Huger  Chief Complaint  Patient presents with   Follow-up    Rm 18 alone here for yearly f/u on      HISTORY OF PRESENT ILLNESS: Today 06/09/22:  Mr. Stephen Holland is a 71 year old male with a history of OSA on CPAP. He returns today for follow-up.  Reports that his CPAP is working well.  He denies any new issues.  His download is below    06/04/21: Mr. [name] is a 71 year old male with a history of obstructive sleep apnea on CPAP.  He has one of the new [employer].  His download indicates that he uses machine nightly for compliance and 100%.  He uses machine greater than 4 hours each night.  On average he uses his machine 8 hours and 58 minutes.  His residual AHI is 0.4 on 8 to 16 cm of water.  He reports that he is still getting used to the machine but denies any new issues.  He joins me today for follow-up.  HISTORY (Copied from Dr.Dohmeier's note) CASETON Holland is a 5 - year- old Caucasian male patient seen here upon  referral by PCP, on 01/08/2021 for a new sleep evaluation.  he is an active pateint of Dr. Marjory Lies.  Chief concern according to patient :  " The machine is old, can't adjust nothing, all water is gone in AM. "   I have the pleasure of seeing Stephen Holland today, a right-handed White or Caucasian male with a OSA and COPD overlap disorder.  he  has a past medical history of Colon polyps, DJD (degenerative joint disease), ED (erectile dysfunction), Gouty arthritis, nasal obstruction, congestion,  Hyperlipemia ( cholesterol and triglyceride) , Hypertension, active tobacco user, chronic dysphonia, Obstructive sleep apnea, Prostate cancer (HCC) (01/2002), Neuropathy on Gabapentin- exam for  DOT, and Vitamin D deficiency..   The patient had the first sleep study in the year 2012 and the study records are not available here.  Sleep relevant medical history:   hyperkalemia, snoring, hearing loss.    Family medical /sleep history: No  other family member on CPAP with OSA, insomnia, sleep walkers.    Social history:  Patient is still part time working as a retiree from Engineer, maintenance works.  He lives with spouse and one dog. The patient currently works 2 days a week.    Tobacco use; 50 pack year, wife and he relapsed in November after quitting for 8 month.  ETOH use ;  Bourbon, Vodka , Caffeine intake in form of Coffee( 6-8 cups ) Soda( cola - 2-3 week) Tea ( 2 glasses at dinner) or energy drinks. Regular exercise in form of working- no program. .       Sleep habits are as follows: The patient's dinner time is between 5 PM. The patient goes to bed at 8.30 PM and falls asleep by 9-10 Pm continues to sleep for intervals of 3-5 hours, wakes for one bathroom breaks. The preferred sleep position is sideways and prone  , with the support of  pillows.  Dreams are reportedly infrequent.  6  AM is the usual rise time. The patient wakes up spontaneously. He reports not feeling refreshed or restored in AM if he has not used  CPAP , CPAP dependent -      REVIEW OF SYSTEMS: Out of a complete 14 system review of symptoms,  the patient complains only of the following symptoms, and all other reviewed systems are negative.   ESS 1  ALLERGIES: Allergies  Allergen Reactions   Allopurinol Rash    HOME MEDICATIONS: Outpatient Medications Prior to Visit  Medication Sig Dispense Refill   acetaminophen (TYLENOL) 650 MG CR tablet Take 650 mg by mouth every 8 (eight) hours as needed for pain.     bisoprolol (ZEBETA) 10 MG tablet TAKE 1 TABLET BY MOUTH EVERY DAY FOR BLOOD PRESSURE 90 tablet 1   Cholecalciferol (VITAMIN D3) 125 MCG (5000 UT) CAPS Take 10,000 Units by mouth every morning.     clonazePAM (KLONOPIN) 1 MG tablet TAKE 0.5-1 TABLETS (0.5-1 MG TOTAL) BY MOUTH AT BEDTIME AS NEEDED (RESTLESS LEG SYNDROME). 90 tablet 0   gabapentin (NEURONTIN) 600 MG  tablet TAKE 1/2 TO 1 TABLET 2 TO 3 TIMES DAILY AS NEEDED FOR NEUROPATHY PAIN (Patient taking differently: Take 600 mg by mouth 2 (two) times daily. FOR NEUROPATHY PAIN) 270 tablet 3   Krill Oil 350 MG CAPS Take 350 mg by mouth every morning.     Magnesium 400 MG CAPS Take 800 mg by mouth every morning.     meloxicam (MOBIC) 15 MG tablet TAKE 1 TABLET BY MOUTH DAILY AS NEEDED WITH FOOD FOR PAIN & INFLAMMATION 90 tablet 3   PRESCRIPTION MEDICATION Inhale into the lungs at bedtime. CPAP     rosuvastatin (CRESTOR) 40 MG tablet TAKE 1 TABLET BY MOUTH EVERY DAY FOR CHOLESTEROL 90 tablet 3   telmisartan (MICARDIS) 80 MG tablet TAKE 1 TABLET BY MOUTH EVERY DAY FOR BLOOD PRESSURE 90 tablet 3   TURMERIC PO Take 2,600 mg by mouth in the morning and at bedtime.     No facility-administered medications prior to visit.    PAST MEDICAL HISTORY: Past Medical History:  Diagnosis Date   AKI (acute kidney injury) (HCC) 03/01/2021   Colon polyps    DJD (degenerative joint disease)    ED (erectile dysfunction)    Gout    Hyperlipemia    Hypertension    Obstructive sleep apnea    08/2012, cpap setting of 14   Prostate cancer (HCC) 01/2002   s/p prostatectomy   Vitamin D deficiency     PAST SURGICAL HISTORY: Past Surgical History:  Procedure Laterality Date   CATARACT EXTRACTION, BILATERAL Bilateral 12/2020   Dr. Darel Hong   COLONOSCOPY WITH PROPOFOL N/A 09/06/2016   Procedure: COLONOSCOPY WITH PROPOFOL;  Surgeon: Charolett Bumpers, MD;  Location: [facility];  Service: Endoscopy;  Laterality: N/A;   colonscopy     x 3 with polyp one time   PROSTATECTOMY  2003   SKIN CANCER EXCISION      FAMILY HISTORY: Family History  Problem Relation Age of Onset   Hypertension Mother    Pulmonary fibrosis Mother    Stroke Father    Heart attack Maternal Grandmother    Stroke Maternal Grandfather    Stroke Paternal Grandmother    Colon cancer Paternal Grandfather     SOCIAL HISTORY: Social History    Socioeconomic History   Marital status: Married    Spouse name: [name]   Number of children: 2   Years of education: 12   Highest education level: Not on file  Occupational History    Comment: truck driver  Tobacco Use   Smoking status: Former    Packs/day: 1.00    Years: 45.00    Total pack years: 45.00    Types: Cigarettes  Quit date: 03/01/2021    Years since quitting: 1.2   Smokeless tobacco: Never  Vaping Use   Vaping Use: Never used  Substance and Sexual Activity   Alcohol use: Yes    Alcohol/week: 15.0 standard drinks of alcohol    Types: 15 Standard drinks or equivalent per week    Comment: 03/31/20  2-3 mixed drinks per day   Drug use: No   Sexual activity: Not on file  Other Topics Concern   Not on file  Social History Narrative   Lives home with wife. Retired.  Education HS.  Children 2     Caffeine 6 cups coffee daily, 2-3 glasses of tea and one coke per day   Social Determinants of Health   Financial Resource Strain: Not on file  Food Insecurity: No Food Insecurity (02/04/2022)   Hunger Vital Sign    Worried About Running Out of Food in the Last Year: Never true    Ran Out of Food in the Last Year: Never true  Transportation Needs: No Transportation Needs (11/10/2021)   PRAPARE - Administrator, Civil Service (Medical): No    Lack of Transportation (Non-Medical): No  Physical Activity: Not on file  Stress: Not on file  Social Connections: Not on file  Intimate Partner Violence: Not on file      PHYSICAL EXAM  Vitals:   06/09/22 1055  BP: (!) 141/78  Pulse: (!) 59  Weight: 197 lb (89.4 kg)  Height: 5\' 7"  (1.702 m)   Body mass index is 30.85 kg/m.  Generalized: Well developed, in no acute distress  Chest: Lungs clear to auscultation bilaterally  Neurological examination  Mentation: Alert oriented to time, place, history taking. Follows all commands speech and language fluent Cranial nerve II-XII: Extraocular movements were  full, visual field were full on confrontational test Head turning and shoulder shrug  were normal and symmetric. Motor: The motor testing reveals 5 over 5 strength of all 4 extremities. Good symmetric motor tone is noted throughout.  Sensory: Sensory testing is intact to soft touch on all 4 extremities. No evidence of extinction is noted.  Gait and station: Gait is normal.    DIAGNOSTIC DATA (LABS, IMAGING, TESTING) - I reviewed patient records, labs, notes, testing and imaging myself where available.  Lab Results  Component Value Date   WBC 8.2 03/31/2022   HGB 15.5 03/31/2022   HCT 45.0 03/31/2022   MCV 93.6 03/31/2022   PLT 131 (L) 03/31/2022      Component Value Date/Time   NA 139 03/31/2022 0950   NA 139 05/08/2020 0922   K 4.3 03/31/2022 0950   CL 105 03/31/2022 0950   CO2 23 03/31/2022 0950   GLUCOSE 89 03/31/2022 0950   BUN 22 03/31/2022 0950   BUN 29 (H) 05/08/2020 0922   CREATININE 1.11 03/31/2022 0950   CALCIUM 9.1 03/31/2022 0950   PROT 6.5 03/31/2022 0950   ALBUMIN 4.1 06/13/2017 1004   AST 48 (H) 03/31/2022 0950   ALT 59 (H) 03/31/2022 0950   ALKPHOS 58 06/13/2017 1004   BILITOT 0.6 03/31/2022 0950   GFRNONAA 44 (L) 03/25/2021 1123   GFRAA 51 (L) 03/25/2021 1123   Lab Results  Component Value Date   CHOL 142 03/25/2022   HDL 61 03/25/2022   LDLCALC 58 03/25/2022   TRIG 157 (H) 03/25/2022   CHOLHDL 2.3 03/25/2022   Lab Results  Component Value Date   HGBA1C 5.4 12/21/2021   Lab Results  Component Value Date   VITAMINB12 517 08/21/2020   Lab Results  Component Value Date   TSH 0.94 12/21/2021      ASSESSMENT AND PLAN 71 y.o. year old male  has a past medical history of AKI (acute kidney injury) (HCC) (03/01/2021), Colon polyps, DJD (degenerative joint disease), ED (erectile dysfunction), Gout, Hyperlipemia, Hypertension, Obstructive sleep apnea, Prostate cancer (HCC) (01/2002), and Vitamin D deficiency. here with:  OSA on CPAP  - CPAP  compliance excellent - Good treatment of AHI  - Encourage patient to use CPAP nightly and > 4 hours each night - F/U in 1 year or sooner if needed    Butch Penny, MSN, NP-C 06/09/2022, 11:06 AM [facility] [address] [place], [place] [address] [phone]

## 2022-06-08 NOTE — Progress Notes (Signed)
Pharmacist Visit   Stephen Holland, Stephen Holland 71 years, Male  DOB: 12/03/1950  M: [phone]  __________________________________________________ Chronic Conditions Patient's Chronic Conditions: Hypertension (HTN), Cardiovascular Disease (CVD), Chronic Obstructive Pulmonary Disease (COPD), Chronic Kidney Disease (CKD), Hyperlipidemia/Dyslipidemia (HLD), Gout, Diabetes (DM), Other List Other Conditions (separated by comma): Prediabetes, OSA and COPD overlap syndrome, Chronic intermittent hypoxia w/ OSA, Neuropathy, Carpal tunnel syndrome of right upper limb, DJD, Septic olecranon bursitis of left elbow, Vitamin D deficiency, Persistent proteinuria, PLMD  Summary for PCP:  1. Patient currently smoking 1 pack per day. PCP please add current smoker in diagnosis codes. Would like to quit but wants to do it on his own. Gave resources through [email or web address]. 2. PCP please address Meloxicam use and fluctuating GFR and kidney function. Patient is taking daily and was counseled on staying hydrated to protect kidneys. 3. Consider redrawing Microalb/SCR. 4. Follow uric acid levels. 5. Patient has diagnosis of COPD but no history of PFTs. Cannot formally diagnose without that information.  Disease Assessments Current BP: 110/68 Current HR: 65 taken on: 03/25/2022 Weight: 205 BMI: 32.14 Last GFR: 71 taken on: 03/31/2022 Visit Completed on: 06/02/2022 Why did the patient present?: CCM Follow up What does the patient do during the day?: Still works 2 days per week driving trucks Lifestyle habits such as diet and exercise?: No exercise. Works in yard. No formal diet Alcohol, tobacco, and illicit drug usage?: Current smoker. 1 PPD What is the patient's sleep pattern?: Trouble falling asleep How many hours per night does patient typically sleep?: 9 Patient pleased with health care they are receiving?: Yes Factors that may affect medication adherence?: Pill burden Is Patient using [facility] pharmacy?: No Name and  [facility] of [facility] Current Rx insurance plan: UHC Are meds synced by current pharmacy?: No Are meds delivered by current pharmacy?: No - delivery not available Would patient benefit from direct intervention of clinical lead in dispensing process to optimize clinical outcomes?: No Are [facility] pharmacy services available where patient lives?: Yes Is patient disadvantaged to use [facility]?: No [facility] services reviewed with patient and patient wishes to change pharmacy?: No Select reason patient declined to change pharmacies: Ability to purchase non-pharmacy items at the same time Does patient experience delays in picking up medications due to transportation concerns (getting to pharmacy)?: No Any additional demeanor/mood notes?: Sweet gentleman presenting via phone for CCM follow up. No complaints today other than a few low blood pressure readings a few days ago. Recovering from his carpal tunnel surgery well.  SDOH: Accountable Health Communities Health-Related Social Needs Screening Tool (BloggerBowl.es) SDOH questions were documented and reviewed (EMR or [employer]) within the past 3 months?: No What is your living situation today? (ref #1): I have a steady place to live Think about the place you live. Do you have problems with any of the following? (ref #2): None of the above Within the past 12 months, you worried that your food would run out before you got money to buy more (ref #3): Never true Within the past 12 months, the food you bought just didn't last and you didn't have money to get more (ref #4): Never true In the past 12 months, has lack of reliable transportation kept you from medical appointments, meetings, work or from getting things needed for daily living? (ref #5): No In the past 12 months, has the electric, gas, oil, or water company threatened to shut off services in your  home? (ref #6): No How often does anyone,  including family and friends, physically hurt you? (ref #7): Never (1) How often does anyone, including family and friends, insult or talk down to you? (ref #8): Never (1) How often does anyone, including friends and family, threaten you with harm? (ref #9): Never (1) How often does anyone, including family and friends, scream or curse at you? (ref #10): Never (1)  Hypertension (HTN) Discussed with patient today?: Yes Is patient able to obtain BP reading today?: Yes BP today is: 122/60 Heart Rate is: 62 Goal: <130/80 mmHG Hypertension Stage: Elevated (SBP: 120-129 and DBP < 80) Is Patient checking BP at home?: Yes Patient home BP readings are ranging: 103-126/40s-60s How often does patient miss taking their blood pressure medications?: None Has patient experienced hypotension, dizziness, falls or bradycardia?: No Check present secondary causes (below) for HTN: Obesity, Sleep Apnea, CKD Does Patient use RPM device?: No BP RPM device: Does patient qualify?: No We discussed: DASH diet:  following a diet emphasizing fruits and vegetables and low-fat dairy products along with whole grains, fish, poultry, and nuts. Reducing red meats and sugars., Smoking cessation, Targeting 150 minutes of aerobic activity per week, Recommend using a salt substitute to replace your salt if you need flavor., Getting enough potassium in your diet equaling 3500-'5000mg'$ /day.  This helps to regulate BP by balancing out the effects of salt., Weight reduction- We discussed losing 5-10% of body weight., Proper Home BP Measurement, Hypertension pathophysiology, complications, treatment goals, and management with patient for 10-15 minutes, Increasing movement, Increasing exercise (walking, biking, swimming) to a goal of 30 minutes per day, as able based on current activity level and health or as directed by your healthcare provider., Contacting PCP office for signs and symptoms of high or  low blood pressure (hypotension, dizziness, falls, headaches, edema) Assessment:: Controlled Drug: Bisoprolol '10mg'$  daily Assessment: Appropriate, Effective, Safe, Accessible Drug: Telmisartan '80mg'$  daily Assessment: Appropriate, Effective, Safe, Accessible Additional Info: Patient had 1-2 days of low BPs ~80s/40s but after some questioning seems to be related to dehydration and heat without proper fluid intake. BPs have been back to normal. Patient was not symptomatic at these times. Plan to Review: BP log Plan to Counsel: Increasing proper fluid intake, weight loss, and moderate intensity exercise.  HC Follow up: Patient outreach in 2 months Pharmacist Follow up: Will schedule in 3+ months  Chronic kidney disease (CKD) Previous GFR: 71 taken on: 03/31/2022 The current microalbumin ratio is: 305 tested on: 12/21/2021 Discussed with patient today?: Yes CKD Stage: Stage 2 (GFR 61-90 ml/min) Albuminuria Stage: A3 (>300) Contributing factors for developing CKD: Diabetes, HTN, Proteinuria, Smoking, Chronic NSAID use Is Patient taking statin medication: Yes Is patient taking ACEi / ARB?: Yes Renal dose adjustments recommended?: No We discussed: Limiting dietary sodium intake to less than 2000 mg / day, Maintaining blood pressure control, Maintaining blood glucose control, Smoking cessation, Avoidance of nephrotoxic drugs (NSAIDs) Assessment:: Uncontrolled Plan to Start: Smoking Cessation Plan Plan to Modify: PCP consider alternative to Meloxicam Plan to Order: PCP consider updated Microalb/SCR Plan to Review: SCR, GFR, Microalb/SCR Plan to Counsel: Proper fluid intake, limiting Meloxicam HC Follow up: 2 months Pharmacist Follow up: 3+ months  Tobacco Use Disorder Discussed with patient today?: Yes Has there been change in patients smoking/vaping habit since last visit with pharmacist?: Yes What changes has the patient made?: Patient current smoker 1 PPD Is patient currently Smoking or  Vaping?: Yes How soon after you wake up do you smoke your first cigarette?: Within 6-30 minutes (2 points) Do  you find it difficult to refrain from smoking in places where it is forbidden (church, Art therapist, Archivist, restaurants)?: No (0 points) Which cigarette would you hate most to give up?: When craving (0 points) How many cigarettes per day do you smoke?: 11 to 20 (1 point) Do you smoke more during the morning hours than during the rest of the day?: No (0 points) Faegerstrom score:: 3 What health problems has your smoking contributed to?: Breathing, heart How motivated are you to quit smoking in the next 30 days (scale 1-10)?: 4 How confident are you that you can quit smoking (scale 1-10)?: 7 Is patient ready to quit smoking?: No What do you like about smoking?: Habit We discussed: Annual Low-dose chest CT, Spent 10-15 minutes educating on risk of tobacco and nicotine use and discussing cessation methods Assessment:: Uncontrolled Plan to Start: Make quit plan on smokefree.gov HC Follow up: Smoking check in 2 months Pharmacist Follow up: 3+ months  Exercise, Diet and Non-Drug Coordination Needs Additional exercise counseling points. We discussed: incorporating flexibility, balance, and strength training exercises, decreasing sedentary behavior, aiming to lose 5 to 10% of body weight through lifestyle modifications, targeting at least 151 minutes per week of moderate-intensity aerobic exercise. Additional diet counseling points. We discussed: key components of the DASH diet, key components of a low-carb eating plan, aiming to consume at least 8 cups of water day Discussed Non-Drug Care Coordination Needs: Yes Does Patient have Medication financial barriers?: No  Engagement Notes CPP Prep: 56mn CPP OV: 389m CPP Doc: [id number] Care Plan: [id number] Summary Patient Risk: Moderate Next CCM Follow Up: Patient will schedule based on work  Next AWV: 03/28/23 Next PCP Visit:  06/30/22  AverMarda StalkerarmD Clinical Pharmacist AverNaida Sleightton'[email or web address]'$ .care [phone]

## 2022-06-09 ENCOUNTER — Ambulatory Visit: Payer: Medicare Other | Admitting: Adult Health

## 2022-06-09 VITALS — BP 141/78 | HR 59 | Ht 67.0 in | Wt 197.0 lb

## 2022-06-09 DIAGNOSIS — J449 Chronic obstructive pulmonary disease, unspecified: Secondary | ICD-10-CM

## 2022-06-09 DIAGNOSIS — G4733 Obstructive sleep apnea (adult) (pediatric): Secondary | ICD-10-CM | POA: Diagnosis not present

## 2022-06-09 NOTE — Patient Instructions (Signed)
Continue using CPAP nightly and greater than 4 hours each night °If your symptoms worsen or you develop new symptoms please let us know.  ° °

## 2022-06-23 ENCOUNTER — Ambulatory Visit: Payer: Medicare Other | Admitting: Internal Medicine

## 2022-06-26 ENCOUNTER — Other Ambulatory Visit: Payer: Self-pay | Admitting: Internal Medicine

## 2022-06-26 ENCOUNTER — Other Ambulatory Visit: Payer: Self-pay | Admitting: Orthopaedic Surgery

## 2022-06-26 DIAGNOSIS — G609 Hereditary and idiopathic neuropathy, unspecified: Secondary | ICD-10-CM

## 2022-06-28 DIAGNOSIS — I10 Essential (primary) hypertension: Secondary | ICD-10-CM | POA: Diagnosis not present

## 2022-06-28 DIAGNOSIS — N1831 Chronic kidney disease, stage 3a: Secondary | ICD-10-CM | POA: Diagnosis not present

## 2022-06-28 DIAGNOSIS — E782 Mixed hyperlipidemia: Secondary | ICD-10-CM | POA: Diagnosis not present

## 2022-06-28 DIAGNOSIS — M1000 Idiopathic gout, unspecified site: Secondary | ICD-10-CM | POA: Diagnosis not present

## 2022-06-29 ENCOUNTER — Encounter: Payer: Self-pay | Admitting: Internal Medicine

## 2022-06-29 NOTE — Progress Notes (Unsigned)
Future Appointments  Date Time Provider Department  06/30/2022           6 mo ov  9:30 AM [name], MD [facility]  09/01/2022  9:00 AM Carleene Mains, [facility]  09/01/2022  9:30 AM Mcarthur Rossetti, MD [facility]  12/23/2022         cpe  2:00 PM [name], MD [facility]  03/28/2023        wellness 11:00 AM Darrol Jump, NP [facility]  06/15/2023 10:30 AM Ward Givens, NP [facility]    History of Present Illness:       This very nice 71 y.o.  MWM presents for 6 month follow up with HTN, HLD, Pre-Diabetes and Vitamin D Deficiency. Patient has COPD (55 pk yr smoker) and is on CPAP for OSA  followed by Ward Givens, NP.   Chest CT scan on 16 Nov 2019 showed Coronary Artery & Aortic Atherosclerosis .  Patient has hx/o Gout Asx off meds.        Patient is treated for HTN (1980's) & BP has been controlled at home.  Today's BP is  at goal - 134/76 .  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  Patient does have CKD3a attributed to his HTN.  Patient has had no complaints of any cardiac type chest pain, palpitations, dyspnea / orthopnea / PND, dizziness, claudication, or dependent edema.       Hyperlipidemia is controlled with diet & Rosuvastatin. Patient denies myalgias or other med SE's. Last Lipids were at goal except elevated Trig's:  Lab Results  Component Value Date   CHOL 142 03/25/2022   HDL 61 03/25/2022   LDLCALC 58 03/25/2022   TRIG 157 (H) 03/25/2022   CHOLHDL 2.3 03/25/2022     Also, the patient has moderate Obesity (BMI 32+) and history of PreDiabetes  (A1c 5.7% /2011) and has had no symptoms of reactive hypoglycemia, diabetic polys, paresthesias or visual blurring.  Last A1c was not at goal:  Lab Results  Component Value Date   HGBA1C 5.4 12/21/2021                                                      Further, the patient also has history of Vitamin D Deficiency ("12" /2009) and supplements vitamin D without any suspected  side-effects. Last vitamin D was at goal:  Lab Results  Component Value Date   VD25OH 88 12/21/2021        Current Outpatient Medications:    acetaminophen (TYLENOL) 650 MG CR tablet, Take 650 mg by mouth every 8 (eight) hours as needed for pain., Disp: , Rfl:    bisoprolol (ZEBETA) 10 MG tablet, TAKE 1 TABLET BY MOUTH EVERY DAY FOR BLOOD PRESSURE, Disp: 90 tablet, Rfl: 1   Cholecalciferol (VITAMIN D3) 125 MCG (5000 UT) CAPS, Take 10,000 Units by mouth every morning., Disp: , Rfl:    clonazePAM (KLONOPIN) 1 MG tablet, TAKE 0.5-1 TABLETS (0.5-1 MG TOTAL) BY MOUTH AT BEDTIME AS NEEDED (RESTLESS LEG SYNDROME)., Disp: 90 tablet, Rfl: 0   gabapentin (NEURONTIN) 600 MG tablet, TAKE 1/2 TO 1 TABLET 2 TO 3 TIMES DAILY AS NEEDED FOR NEUROPATHY PAIN, Disp: 270 tablet, Rfl: 3   Krill Oil 350 MG CAPS, Take 350 mg by mouth every morning., Disp: , Rfl:  Magnesium 400 MG CAPS, Take 800 mg by mouth every morning., Disp: , Rfl:    meloxicam (MOBIC) 15 MG tablet, TAKE 1 TABLET BY MOUTH DAILY AS NEEDED WITH FOOD FOR PAIN & INFLAMMATION, Disp: 90 tablet, Rfl: 3   PRESCRIPTION MEDICATION, Inhale into the lungs at bedtime. CPAP, Disp: , Rfl:    rosuvastatin (CRESTOR) 40 MG tablet, TAKE 1 TABLET BY MOUTH EVERY DAY FOR CHOLESTEROL, Disp: 90 tablet, Rfl: 3   telmisartan (MICARDIS) 80 MG tablet, TAKE 1 TABLET BY MOUTH EVERY DAY FOR BLOOD PRESSURE, Disp: 90 tablet, Rfl: 3   TURMERIC PO, Take 2,600 mg by mouth in the morning and at bedtime., Disp: , Rfl:     Allergies  Allergen Reactions   Allopurinol Rash    PMHx:   Past Medical History:  Diagnosis Date   AKI (acute kidney injury) ([place]) 03/01/2021   Colon polyps    DJD (degenerative joint disease)    ED (erectile dysfunction)    Gout    Hyperlipemia    Hypertension    Obstructive sleep apnea    08/2012, cpap setting of 14   Prostate cancer ([place]) 01/2002   s/p prostatectomy   Vitamin D deficiency     Immunization History  Administered Date(s)  Administered   Fluad Quad, high Dose 10/03/2020   Influenza Split 08/26/2014   Influenza, High Dose  08/12/2016, 10/17/2018, 09/05/2019   Influenza 10/27/2015   PFIZER-SARS-COV-2 Vacc 01/05/2020, 01/26/2020, 09/27/2020   PPD Test 05/23/2014, 07/23/2015   Pneumococcal -13 08/26/2014   Pneumococcal -23 05/05/2010, 01/30/2016   Td 11/30/2003   Tdap 08/12/2010, 08/21/2020, 03/01/2021   Zoster, Live 04/29/2013     Past Surgical History:  Procedure Laterality Date   CATARACT EXTRACTION, BILATERAL Bilateral 12/2020   Dr. Tommy Rainwater   COLONOSCOPY WITH PROPOFOL N/A 09/06/2016   Procedure: COLONOSCOPY WITH PROPOFOL;  Surgeon: Garlan Fair, MD;  Location: [facility];  Service: Endoscopy;  Laterality: N/A;   colonscopy     x 3 with polyp one time   PROSTATECTOMY  2003   SKIN CANCER EXCISION      FHx:    Reviewed / unchanged  SHx:    Reviewed / unchanged   Systems Review:  Constitutional: Denies fever, chills, wt changes, headaches, insomnia, fatigue, night sweats, change in appetite. Eyes: Denies redness, blurred vision, diplopia, discharge, itchy, watery eyes.  ENT: Denies discharge, congestion, post nasal drip, epistaxis, sore throat, earache, hearing loss, dental pain, tinnitus, vertigo, sinus pain, snoring.  CV: Denies chest pain, palpitations, irregular heartbeat, syncope, dyspnea, diaphoresis, orthopnea, PND, claudication or edema. Respiratory: denies cough, dyspnea, DOE, pleurisy, hoarseness, laryngitis, wheezing.  Gastrointestinal: Denies dysphagia, odynophagia, heartburn, reflux, water brash, abdominal pain or cramps, nausea, vomiting, bloating, diarrhea, constipation, hematemesis, melena, hematochezia  or hemorrhoids. Genitourinary: Denies dysuria, frequency, urgency, nocturia, hesitancy, discharge, hematuria or flank pain. Musculoskeletal: Denies arthralgias, myalgias, stiffness, jt. swelling, pain, limping or strain/sprain.  Skin: Denies pruritus, rash, hives, warts, acne,  eczema or change in skin lesion(s). Neuro: No weakness, tremor, incoordination, spasms, paresthesia or pain. Psychiatric: Denies confusion, memory loss or sensory loss. Endo: Denies change in weight, skin or hair change.  Heme/Lymph: No excessive bleeding, bruising or enlarged lymph nodes.  Physical Exam  BP 134/76   Pulse (!) 55   Temp (!) 96.9 F (36.1 C)   Ht '5\' 7"'$  (1.702 m)   Wt 194 lb 9.6 oz (88.3 kg)   SpO2 95%   BMI 30.48 kg/m   Appears  over nourished, well  groomed  and in no distress.  Eyes: PERRLA, EOMs, conjunctiva no swelling or erythema. Sinuses: No frontal/maxillary tenderness ENT/Mouth: EAC's clear, TM's nl w/o erythema, bulging. Nares clear w/o erythema, swelling, exudates. Oropharynx clear without erythema or exudates. Oral hygiene is good. Tongue normal, non obstructing. Hearing intact.  Neck: Supple. Thyroid not palpable. Car 2+/2+ without bruits, nodes or JVD. Chest: Respirations nl with BS clear & equal w/o rales, rhonchi, wheezing or stridor.  Cor: Heart sounds normal w/ regular rate and rhythm without sig. murmurs, gallops, clicks or rubs. Peripheral pulses normal and equal  without edema.  Abdomen: Soft & bowel sounds normal. Non-tender w/o guarding, rebound, hernias, masses or organomegaly.  Lymphatics: Unremarkable.  Musculoskeletal: Full ROM all peripheral extremities, joint stability, 5/5 strength and normal gait.  Skin: Warm, dry without exposed rashes, lesions or ecchymosis apparent.  Neuro: Cranial nerves intact, reflexes equal bilaterally. Sensory-motor testing grossly intact. Tendon reflexes grossly intact.  Pysch: Alert & oriented x 3.  Insight and judgement nl & appropriate. No ideations.  Assessment and Plan:  1. Essential hypertension  - CBC with Differential/Platelet - COMPLETE METABOLIC PANEL WITH GFR - Magnesium - TSH  2. Hyperlipidemia, mixed  - Lipid panel - TSH  3. Abnormal glucose  - Hemoglobin A1c - Insulin, random  4.  Vitamin D deficiency  - VITAMIN D 25 Hydroxy   5. Idiopathic gout  - Uric acid  6. Aortic atherosclerosis ([place]) by Chest CT on 12.18/2020  - Lipid panel  7. Stage 3a chronic kidney disease (HCC)  - COMPLETE METABOLIC PANEL WITH GFR  8. Medication management  - CBC with Differential/Platelet - COMPLETE METABOLIC PANEL WITH GFR - Magnesium - Lipid panel - TSH - Hemoglobin A1c - Insulin, random - VITAMIN D 25 Hydroxy  - Uric acid         Discussed  regular exercise, BP monitoring, weight control to achieve/maintain BMI less than 25 and discussed med and SE's.  Recommended labs to assess and monitor clinical status with further disposition pending results of labs.  I discussed the assessment and treatment plan with the patient. The patient was provided an opportunity to ask questions and all were answered. The patient agreed with the plan and demonstrated an understanding of the instructions.  I provided over 30 minutes of exam, counseling, chart review and  complex critical decision making.        The patient was advised to call back or seek an in-person evaluation if the symptoms worsen or if the condition fails to improve as anticipated.   Kirtland Bouchard, MD

## 2022-06-29 NOTE — Patient Instructions (Signed)

## 2022-06-30 ENCOUNTER — Encounter: Payer: Self-pay | Admitting: Internal Medicine

## 2022-06-30 ENCOUNTER — Ambulatory Visit (INDEPENDENT_AMBULATORY_CARE_PROVIDER_SITE_OTHER): Payer: Medicare Other | Admitting: Internal Medicine

## 2022-06-30 VITALS — BP 134/76 | HR 55 | Temp 96.9°F | Ht 67.0 in | Wt 194.6 lb

## 2022-06-30 DIAGNOSIS — N1831 Chronic kidney disease, stage 3a: Secondary | ICD-10-CM

## 2022-06-30 DIAGNOSIS — E782 Mixed hyperlipidemia: Secondary | ICD-10-CM | POA: Diagnosis not present

## 2022-06-30 DIAGNOSIS — I10 Essential (primary) hypertension: Secondary | ICD-10-CM

## 2022-06-30 DIAGNOSIS — R7309 Other abnormal glucose: Secondary | ICD-10-CM | POA: Diagnosis not present

## 2022-06-30 DIAGNOSIS — I700 Atherosclerosis of aorta: Secondary | ICD-10-CM | POA: Diagnosis not present

## 2022-06-30 DIAGNOSIS — M1000 Idiopathic gout, unspecified site: Secondary | ICD-10-CM | POA: Diagnosis not present

## 2022-06-30 DIAGNOSIS — Z79899 Other long term (current) drug therapy: Secondary | ICD-10-CM | POA: Diagnosis not present

## 2022-06-30 DIAGNOSIS — E559 Vitamin D deficiency, unspecified: Secondary | ICD-10-CM | POA: Diagnosis not present

## 2022-07-01 LAB — HEMOGLOBIN A1C
Hgb A1c MFr Bld: 5.2 % of total Hgb (ref <5.7)
Mean Plasma Glucose: 103 mg/dL
eAG (mmol/L): 5.7 mmol/L

## 2022-07-01 LAB — COMPLETE METABOLIC PANEL WITH GFR
AG Ratio: 1.4 (calc) (ref 1.0–2.5)
ALT: 17 U/L (ref 9–46)
AST: 19 U/L (ref 10–35)
Albumin: 4.2 g/dL (ref 3.6–5.1)
Alkaline phosphatase (APISO): 74 U/L (ref 35–144)
BUN/Creatinine Ratio: 20 (calc) (ref 6–22)
BUN: 30 mg/dL — ABNORMAL HIGH (ref 7–25)
CO2: 26 mmol/L (ref 20–32)
Calcium: 10 mg/dL (ref 8.6–10.3)
Chloride: 106 mmol/L (ref 98–110)
Creat: 1.53 mg/dL — ABNORMAL HIGH (ref 0.70–1.28)
Globulin: 3.1 g/dL (calc) (ref 1.9–3.7)
Glucose, Bld: 85 mg/dL (ref 65–99)
Potassium: 5.1 mmol/L (ref 3.5–5.3)
Sodium: 139 mmol/L (ref 135–146)
Total Bilirubin: 0.5 mg/dL (ref 0.2–1.2)
Total Protein: 7.3 g/dL (ref 6.1–8.1)
eGFR: 48 mL/min/{1.73_m2} — ABNORMAL LOW (ref >=60)

## 2022-07-01 LAB — LIPID PANEL
Cholesterol: 111 mg/dL (ref <200)
HDL: 37 mg/dL — ABNORMAL LOW (ref >=40)
LDL Cholesterol (Calc): 52 mg/dL (calc)
Non-HDL Cholesterol (Calc): 74 mg/dL (calc) (ref <130)
Total CHOL/HDL Ratio: 3 (calc) (ref <5.0)
Triglycerides: 140 mg/dL (ref <150)

## 2022-07-01 LAB — CBC WITH DIFFERENTIAL/PLATELET
Absolute Monocytes: 684 cells/uL (ref 200–950)
Basophils Absolute: 68 cells/uL (ref 0–200)
Basophils Relative: 0.9 %
Eosinophils Absolute: 68 cells/uL (ref 15–500)
Eosinophils Relative: 0.9 %
HCT: 45.1 % (ref 38.5–50.0)
Hemoglobin: 15.2 g/dL (ref 13.2–17.1)
Lymphs Abs: 2706 cells/uL (ref 850–3900)
MCH: 32 pg (ref 27.0–33.0)
MCHC: 33.7 g/dL (ref 32.0–36.0)
MCV: 94.9 fL (ref 80.0–100.0)
MPV: 10.9 fL (ref 7.5–12.5)
Monocytes Relative: 9 %
Neutro Abs: 4074 cells/uL (ref 1500–7800)
Neutrophils Relative %: 53.6 %
Platelets: 182 10*3/uL (ref 140–400)
RBC: 4.75 10*6/uL (ref 4.20–5.80)
RDW: 12.7 % (ref 11.0–15.0)
Total Lymphocyte: 35.6 %
WBC: 7.6 10*3/uL (ref 3.8–10.8)

## 2022-07-01 LAB — MAGNESIUM: Magnesium: 1.9 mg/dL (ref 1.5–2.5)

## 2022-07-01 LAB — URIC ACID: Uric Acid, Serum: 6.3 mg/dL (ref 4.0–8.0)

## 2022-07-01 LAB — INSULIN, RANDOM: Insulin: 13.1 u[IU]/mL

## 2022-07-01 LAB — TSH: TSH: 0.65 mIU/L (ref 0.40–4.50)

## 2022-07-01 LAB — VITAMIN D 25 HYDROXY (VIT D DEFICIENCY, FRACTURES): Vit D, 25-Hydroxy: 95 ng/mL (ref 30–100)

## 2022-07-01 NOTE — Progress Notes (Signed)
<><><><><><><><><><><><><><><><><><><><><><><><><><><><><><><><><> <><><><><><><><><><><><><><><><><><><><><><><><><><><><><><><><><> -   Test results slightly outside the reference range are not unusual. If there is anything important, I will review this with you,  otherwise it is considered normal test values.  If you have further questions,  please do not hesitate to contact me at the office or via My Chart.  <><><><><><><><><><><><><><><><><><><><><><><><><><><><><><><><><> <><><><><><><><><><><><><><><><><><><><><><><><><><><><><><><><><>  -  Kidney functions Stable  -  Great  !  <><><><><><><><><><><><><><><><><><><><><><><><><><><><><><><><><>  - Total Chol = 111    &   LDL Chol = 52 - Both Wonderful  ! t   - Very low risk for Heart Attack  / Stroke <><><><><><><><><><><><><><><><><><><><><><><><><><><><><><><><><>  -  Uric Acid / Gout crystal returns Normal  - Great ! <><><><><><><><><><><><><><><><><><><><><><><><><><><><><><><><><>  - A1c - Normal - No Diabetes  -  Great ! <><><><><><><><><><><><><><><><><><><><><><><><><><><><><><><><><>  - Vitamin D = 95 - Excellent - Please keep dose Same  <><><><><><><><><><><><><><><><><><><><><><><><><><><><><><><><><>  - All Else - CBC - Kidneys - Electrolytes - Liver - Magnesium & Thyroid    - all  Normal / OK <><><><><><><><><><><><><><><><><><><><><><><><><><><><><><><><><> <><><><><><><><><><><><><><><><><><><><><><><><><><><><><><><><><>  - Keep up the Great work & may the rest of your life be the best of your life !   <><><><><><><><><><><><><><><><><><><><><><><><><><><><><><><><><> <><><><><><><><><><><><><><><><><><><><><><><><><><><><><><><><><>

## 2022-07-07 DIAGNOSIS — L570 Actinic keratosis: Secondary | ICD-10-CM | POA: Diagnosis not present

## 2022-07-30 ENCOUNTER — Other Ambulatory Visit: Payer: Self-pay | Admitting: Family Medicine

## 2022-07-30 ENCOUNTER — Other Ambulatory Visit: Payer: Self-pay | Admitting: Gastroenterology

## 2022-07-30 DIAGNOSIS — K7460 Unspecified cirrhosis of liver: Secondary | ICD-10-CM

## 2022-08-13 ENCOUNTER — Ambulatory Visit
Admission: RE | Admit: 2022-08-13 | Discharge: 2022-08-13 | Disposition: A | Discharge status: Home / Self Care | Payer: Medicare Other | Source: Ambulatory Visit | Attending: Gastroenterology | Admitting: Gastroenterology

## 2022-08-13 DIAGNOSIS — N289 Disorder of kidney and ureter, unspecified: Secondary | ICD-10-CM | POA: Diagnosis not present

## 2022-08-13 DIAGNOSIS — K7460 Unspecified cirrhosis of liver: Secondary | ICD-10-CM

## 2022-08-13 DIAGNOSIS — K5730 Diverticulosis of large intestine without perforation or abscess without bleeding: Secondary | ICD-10-CM | POA: Diagnosis not present

## 2022-08-13 MED ORDER — GADOBENATE DIMEGLUMINE 529 MG/ML IV SOLN
18.0000 mL | Freq: Once | INTRAVENOUS | Status: AC | PRN
  Administered 2022-08-13: 18 mL via INTRAVENOUS

## 2022-08-20 ENCOUNTER — Other Ambulatory Visit: Payer: Self-pay | Admitting: Nurse Practitioner

## 2022-08-20 DIAGNOSIS — G2581 Restless legs syndrome: Secondary | ICD-10-CM

## 2022-08-24 DIAGNOSIS — G4733 Obstructive sleep apnea (adult) (pediatric): Secondary | ICD-10-CM | POA: Diagnosis not present

## 2022-08-26 ENCOUNTER — Telehealth: Payer: Self-pay

## 2022-08-26 NOTE — Telephone Encounter (Signed)
LM-08/26/22-Returned pts. Call and completed Smoking cessation review call. Pt. Has sucessfully cut back from 1ppd to 1/2ppd and has been thinking about quitting. Pt. And wife smoke and are trying to quit together. Pt. Would like to quit d/t Pt. stated "Price is a major factor and I think mainly the social things. I mean there are many people we hang around that don't smoke and that's a smell you can't hide. Both my sisters have quit and other side of family quit, so that is probably the two major things right there." Pt. stated "People we hang around that don't smoke can't hide." Pt. Has no concerns about quitting. Pt. Stated he has quit a few times when he was hospitalized back in April and started back a few weeks later.   Pt. Find's that he smokes more when he is bored or driving. Pt. Plans to quit cold [place] with his wife. Pts. Motivation to quit is for better health, and less sinus trouble in spring and fall. . Educated pt. on how smoke from a cigarette enters the nose and airways which damages and burns the cilia causing mucus to clog up in the nasal canal which can make the body less effective at cleaning out mucus and other substances such as dust particles, from the airways. Informed pt. nicotine can be immunosuppressive causing greater risk of infections, and more severe and longer lasting illnesses compared to non-smokers. Pt. verbalized understanding and agreed.  Provided pt. resources for smokefree.gov when he is ready to quit, and informed pt. if he does plan to proceed w/ quitting to call and I can assist patient and come up with a game plan to make sure pt. can quit successfully. Pt. verbalized understanding and agreed.  Pt. Requested to cancel CP visit for 10/4 and needs to r/s to a Wednesday. Informed pt. I will discuss w/ CP and will call back to r/s. Pt. Verbalized understanding and agreed.  Total time spent: 40 min.

## 2022-08-26 NOTE — Telephone Encounter (Signed)
LM-08/26/22-Chart review started. Reviewing OV, Consults, Hospital visits, Labs and medication changes.  Chart reviewed. Called pt. To complete smoking cessation review call. Unable to reach pt. [place] X1.  Total time spent: 12 min.

## 2022-08-28 DIAGNOSIS — N1831 Chronic kidney disease, stage 3a: Secondary | ICD-10-CM | POA: Diagnosis not present

## 2022-08-28 DIAGNOSIS — E782 Mixed hyperlipidemia: Secondary | ICD-10-CM | POA: Diagnosis not present

## 2022-08-28 DIAGNOSIS — M1000 Idiopathic gout, unspecified site: Secondary | ICD-10-CM | POA: Diagnosis not present

## 2022-08-28 DIAGNOSIS — I10 Essential (primary) hypertension: Secondary | ICD-10-CM | POA: Diagnosis not present

## 2022-09-01 ENCOUNTER — Ambulatory Visit: Payer: Medicare Other | Admitting: Orthopaedic Surgery

## 2022-09-01 ENCOUNTER — Telehealth: Payer: Medicare Other | Admitting: Pharmacy Technician

## 2022-09-01 ENCOUNTER — Encounter: Payer: Self-pay | Admitting: Orthopaedic Surgery

## 2022-09-01 DIAGNOSIS — G5601 Carpal tunnel syndrome, right upper limb: Secondary | ICD-10-CM | POA: Diagnosis not present

## 2022-09-01 DIAGNOSIS — Z9889 Other specified postprocedural states: Secondary | ICD-10-CM

## 2022-09-01 DIAGNOSIS — G5602 Carpal tunnel syndrome, left upper limb: Secondary | ICD-10-CM | POA: Diagnosis not present

## 2022-09-01 NOTE — Progress Notes (Signed)
The patient is a 71 year old gentleman who is now 6 months out from a right open carpal tunnel release.  He has severe bilateral carpal tunnel syndrome.  He is still having numbness and tingling in his median nerve distribution on the right side but he says he is doing much better overall.  On my exam his incision is healed nicely on the right side.  He has good grip and strength strength and no muscle atrophy.  He does have numbness and tingling to be expected still given the severity of his carpal tunnel syndrome.  We talked about at some point proceeding with a left open carpal tunnel release.  He wants to wait until likely the first of the year.  We will see him in early January and go from there.  All questions and concerns were answered and addressed.

## 2022-09-22 ENCOUNTER — Other Ambulatory Visit: Payer: Self-pay | Admitting: Nurse Practitioner

## 2022-10-14 ENCOUNTER — Encounter: Payer: Self-pay | Admitting: Nurse Practitioner

## 2022-10-14 ENCOUNTER — Ambulatory Visit (INDEPENDENT_AMBULATORY_CARE_PROVIDER_SITE_OTHER): Payer: Medicare Other | Admitting: Nurse Practitioner

## 2022-10-14 VITALS — BP 132/86 | HR 53 | Temp 97.7°F | Ht 67.0 in | Wt 193.8 lb

## 2022-10-14 DIAGNOSIS — Z23 Encounter for immunization: Secondary | ICD-10-CM | POA: Diagnosis not present

## 2022-10-14 DIAGNOSIS — E559 Vitamin D deficiency, unspecified: Secondary | ICD-10-CM | POA: Diagnosis not present

## 2022-10-14 DIAGNOSIS — M1000 Idiopathic gout, unspecified site: Secondary | ICD-10-CM

## 2022-10-14 DIAGNOSIS — R519 Headache, unspecified: Secondary | ICD-10-CM | POA: Diagnosis not present

## 2022-10-14 DIAGNOSIS — I10 Essential (primary) hypertension: Secondary | ICD-10-CM | POA: Diagnosis not present

## 2022-10-14 DIAGNOSIS — I700 Atherosclerosis of aorta: Secondary | ICD-10-CM | POA: Diagnosis not present

## 2022-10-14 DIAGNOSIS — Z79899 Other long term (current) drug therapy: Secondary | ICD-10-CM

## 2022-10-14 DIAGNOSIS — E782 Mixed hyperlipidemia: Secondary | ICD-10-CM | POA: Diagnosis not present

## 2022-10-14 DIAGNOSIS — J449 Chronic obstructive pulmonary disease, unspecified: Secondary | ICD-10-CM

## 2022-10-14 DIAGNOSIS — N1831 Chronic kidney disease, stage 3a: Secondary | ICD-10-CM

## 2022-10-14 DIAGNOSIS — G4733 Obstructive sleep apnea (adult) (pediatric): Secondary | ICD-10-CM | POA: Diagnosis not present

## 2022-10-14 DIAGNOSIS — M1990 Unspecified osteoarthritis, unspecified site: Secondary | ICD-10-CM

## 2022-10-14 DIAGNOSIS — D696 Thrombocytopenia, unspecified: Secondary | ICD-10-CM

## 2022-10-14 DIAGNOSIS — F17200 Nicotine dependence, unspecified, uncomplicated: Secondary | ICD-10-CM

## 2022-10-14 MED ORDER — PREDNISONE 20 MG PO TABS: ORAL_TABLET | ORAL | 0 refills | Status: DC

## 2022-10-14 NOTE — Patient Instructions (Signed)
Temporal Arteritis  Temporal arteritis is a condition that causes arteries to become inflamed. It usually affects arteries in your head and face, but arteries in any part of the body can become inflamed. The condition is also called giant cell arteritis.  Temporal arteritis can cause serious problems such as blindness. Early treatment can help prevent these problems. What are the causes? The cause of this condition is not known. What increases the risk? The following factors may make you more likely to develop this condition: Being older than 50. Being a woman. Being Caucasian. Being of [place], [place], [place], [place] or [place] ancestry. Having a family history of the condition. Having a certain condition that causes muscle pain and stiffness (polymyalgia rheumatica, PMR). What are the signs or symptoms? Some people with temporal arteritis have just one symptom, while others have several symptoms. Most symptoms are related to the head and face. These may include: Headache. Hard, swollen, or tender temples. This is common. Your temples are the areas on either side of your forehead. Pain when combing your hair or when laying your head down. Pain in the jaw when chewing. Pain in the throat or tongue. Problems with your vision, such as sudden loss of vision in one eye, or seeing double. Other symptoms may include: Fever. Tiredness (fatigue). A dry cough. Pain in the hips and shoulders. Pain in the arms during exercise. Depression. Weight loss. How is this diagnosed? This condition may be diagnosed based on: Your symptoms. Your medical history. A physical exam. Tests, including: Blood tests. A test in which a tissue sample is removed from an artery so it can be examined (biopsy). Imaging tests, such as an ultrasound or MRI. How is this treated? This condition may be treated with: A type of medicine to reduce inflammation (corticosteroid). Medicines to weaken your immune  system (immunosuppressants). Other medicines to treat vision problems. You will need to see your health care provider while you are being treated. The medicines used to treat this condition can increase your risk of problems such as bone loss and diabetes. During follow-up visits, your health care provider will check for problems by: Doing blood tests and bone density tests. Checking your blood pressure and blood sugar. Follow these instructions at home: Medicines Take over-the-counter and prescription medicines only as told by your health care provider. Take any vitamins or supplements recommended by your health care provider. These may include vitamin D and calcium, which help keep your bones from becoming weak. Eating and drinking  Eat a heart-healthy diet. This may include: Eating high-fiber foods, such as fresh fruits and vegetables, whole grains, and beans. Eating heart-healthy fats (omega-3 fats), such as fish, flaxseed, and flaxseed oil. Limiting foods that are high in saturated fat and cholesterol, such as processed and fried foods, fatty meat, and full-fat dairy. Limiting how much salt (sodium) you eat. Include calcium and vitamin D in your diet. Good sources of calcium and vitamin D include: Low-fat dairy products such as milk, yogurt, and cheese. Certain fish, such as fresh or canned salmon, tuna, and sardines. Products that have calcium and vitamin D added to them (fortified products), such as fortified cereals or juice. General instructions Exercise. Talk with your health care provider about what exercises are okay for you. Exercises that increase your heart rate (aerobic exercise), such as walking, are often recommended. Aerobic exercise helps control your blood pressure and prevent bone loss. Stay up to date on all vaccines as directed by your health care provider. Keep  all follow-up visits as told by your health care provider. This is important. Contact a health care provider  if: Your symptoms get worse. You develop signs of infection, such as fever, swelling, redness, warmth, and tenderness. Get help right away if: You lose your vision. Your pain does not go away, even after you take medicine. You have chest pain. You have trouble breathing. One side of your face or body suddenly becomes weak or numb. These symptoms may represent a serious problem that is an emergency. Do not wait to see if the symptoms will go away. Get medical help right away. Call your local emergency services (911 in the [place] Do not drive yourself to the hospital. Summary Temporal arteritis is a condition that causes arteries to become inflamed. It usually affects arteries in your head and face. This condition can cause serious problems, such as blindness. Treatment can help prevent these problems. Symptoms may include hard or tender temples, pain in your jaw when chewing, problems with your vision, or pain in your hips and shoulders. Take over-the-counter and prescription medicines as told by your health care provider. This information is not intended to replace advice given to you by your health care provider. Make sure you discuss any questions you have with your health care provider. Document Revised: 01/27/2021 Document Reviewed: 01/27/2021 Elsevier Patient Education  [place].

## 2022-10-14 NOTE — Addendum Note (Signed)
Addended by: Chancy Hurter on: 10/14/2022 11:30 AM   Modules accepted: Orders

## 2022-10-14 NOTE — Progress Notes (Addendum)
FOLLOW UP Assessment:   Aortic atherosclerosis ([place]) by Chest CT on 11/16/2019 Continue statin. Lifestyle modifications. Weight loss, dietary modifications.  OSA and COPD overlap syndrome (HCC) Controlled Continue CPAP  Stage 3a chronic kidney disease ([place]) Discussed how what you eat and drink can aide in kidney protection. Stay well hydrated. Avoid high salt foods. Avoid NSAIDS. Keep BP and BG well controlled.   Take medications as prescribed. Remain active and exercise as tolerated daily. Maintain weight.  Continue to monitor. Check CMP/GFR/Microablumin   Morbid obesity ([place]) Discussed appropriate BMI Goal of losing 1 lb per month. Diet modification. Physical activity. Encouraged/praised to build confidence.  Essential hypertension Discussed DASH (Dietary Approaches to Stop Hypertension) DASH diet is lower in sodium than a typical American diet. Cut back on foods that are high in saturated fat, cholesterol, and trans fats. Eat more whole-grain foods, fish, poultry, and nuts Remain active and exercise as tolerated daily.  Monitor BP at home-Call if greater than 130/80.  Check CMP/CBC  Osteoarthritis, unspecified osteoarthritis type, unspecified site Weight loss advised. Continue current pain management  Gabapentin,  Norco, Meloxicam Continue to monitor  Hyperlipidemia, mixed Discussed lifestyle modifications. Recommended diet heavy in fruits and veggies, omega 3's. Decrease consumption of animal meats, cheeses, and dairy products. Remain active and exercise as tolerated. Continue to monitor. Check lipids/TSH   Vitamin D deficiency Continue Cholecalciferol Continue to monitor.   Idiopathic gout, unspecified chronicity, unspecified site Discussed low purine diet. Possible restart of Allopurinol if uric acid levels elevated. Decadron  Thrombocytopenia Continue to monitor CBC  COPD Continue inhalers as directed.  Medication management All  medications discussed and reviewed in full. All questions and concerns regarding medications addressed.    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 Start Prednisone taper Continue to monitor for increase in vision loss Contact office if noticed.   Continue Tylenol as needed as directed.  Smoker Smoking cessation instruction/counseling given:  counseled patient on the dangers of tobacco use, advised patient to stop smoking, and reviewed strategies to maximize success  Orders Placed This Encounter  Procedures   CBC with Differential/Platelet   COMPLETE METABOLIC PANEL WITH GFR   Lipid panel   Sedimentation rate   C-reactive protein   Outpatient Encounter Medications as of 10/14/2022  Medication Sig   acetaminophen (TYLENOL) 650 MG CR tablet Take 650 mg by mouth every 8 (eight) hours as needed for pain.   bisoprolol (ZEBETA) 10 MG tablet TAKE 1 TABLET BY MOUTH EVERY DAY FOR BLOOD PRESSURE   Cholecalciferol (VITAMIN D3) 125 MCG (5000 UT) CAPS Take 10,000 Units by mouth every morning.   clonazePAM (KLONOPIN) 1 MG tablet TAKE 0.5-1 TABLETS (0.5-1 MG TOTAL) BY MOUTH AT BEDTIME AS NEEDED (RESTLESS LEG SYNDROME).   gabapentin (NEURONTIN) 600 MG tablet TAKE 1/2 TO 1 TABLET 2 TO 3 TIMES DAILY AS NEEDED FOR NEUROPATHY PAIN   Krill Oil 350 MG CAPS Take 350 mg by mouth every morning.   Magnesium 400 MG CAPS Take 800 mg by mouth every morning.   meloxicam (MOBIC) 15 MG tablet TAKE 1 TABLET BY MOUTH DAILY AS NEEDED WITH FOOD FOR PAIN & INFLAMMATION   predniSONE (DELTASONE) 20 MG tablet 1 tab 3 x day for 2 days, then 1 tab 2 x day for 2 days, then 1 tab 1 x day for 3 days   PRESCRIPTION MEDICATION Inhale into the lungs at bedtime. CPAP   rosuvastatin (CRESTOR)  40 MG tablet TAKE 1 TABLET BY MOUTH EVERY DAY FOR CHOLESTEROL   telmisartan (MICARDIS) 80 MG tablet TAKE 1 TABLET BY MOUTH EVERY DAY FOR BLOOD PRESSURE    TURMERIC PO Take 2,600 mg by mouth in the morning and at bedtime.   No facility-administered encounter medications on file as of 10/14/2022.    Notify office for further evaluation and treatment, questions or concerns if any reported s/s fail to improve.   The patient was advised to call back or seek an in-person evaluation if any symptoms worsen or if the condition fails to improve as anticipated.   Further disposition pending results of labs. Discussed med's effects and SE's.    I discussed the assessment and treatment plan with the patient. The patient was provided an opportunity to ask questions and all were answered. The patient agreed with the plan and demonstrated an understanding of the instructions.  Discussed med's effects and SE's. Screening labs and tests as requested with regular follow-up as recommended.  I provided 25 minutes of face-to-face time during this encounter including counseling, chart review, and critical decision making was preformed.  Future Appointments  Date Time Provider [place]  10/14/2022 10:30 AM Darrol Jump, NP [facility] None  12/08/2022 10:30 AM Mcarthur Rossetti, MD [facility] None  12/24/2022 10:15 AM Lorretta Harp, MD [facility] None  02/02/2023 11:00 AM [name], MD [facility] None  05/11/2023  9:30 AM Darrol Jump, NP [facility] None  06/15/2023 10:30 AM Ward Givens, NP [facility] None     Subjective:  DEVARIOUS PAVEK is a 71 y.o. male who presents for a 3 month follow up for HTN, hyperlipidemia, prediabetes, and vitamin D Def.   Overall he reports feeling well.  He has been noticing tenderness of the left side of head and along his neck, concentrated in left temporal area most notably for the last three weeks but has been present longer.  Has ice pick HA to the left temporal area, along left ear.  Reports wife noticed that left temple appeared larger than normal.  Associated pain to left side of neck when swallowing.   Left sub-mandible lymph node swelling.  He is taking Tylenol without much relief.   Continues to be an every day smoker. He does smoke, 50+ pack  Year smoking history, chantix did not help, him and his wife are interested in quitting.  He is trying to quit.  He has not had any gout attacks in the past few months.    He has COPD, not on meds at this time. He has OSA.   HE was having DOE, saw cardiology, had CT coronary 04/2020, showed non obstructive CAD.  He was having proteinuria, had normal protein electrophoresis urine and normal work up, he is on ARB.   Right carapel tunnel, follows up on 03/30/22.  Still working 2 days a week trucking.   His blood pressure has been controlled at home, today their BP is    He does not workout. He denies chest pain, shortness of breath, dizziness. BMI is There is no height or weight on file to calculate BMI. He admits to eating too much.   Wt Readings from Last 3 Encounters:  06/30/22 194 lb 9.6 oz (88.3 kg)  06/09/22 197 lb (89.4 kg)  03/31/22 199 lb 9.6 oz (90.5 kg)   Takes klonopin for sleep but will take every other day.   He is on cholesterol medication and denies myalgias. His cholesterol is at goal however triglycerides  are elevated. The cholesterol last visit was:   Lab Results  Component Value Date   CHOL 111 06/30/2022   HDL 37 (L) 06/30/2022   LDLCALC 52 06/30/2022   TRIG 140 06/30/2022   CHOLHDL 3.0 06/30/2022    He has been working on diet and exercise for prediabetes, and denies paresthesia of the feet, polydipsia, polyuria and visual disturbances.  Last A1C in the office was:  Lab Results  Component Value Date   HGBA1C 5.2 06/30/2022   Lab Results  Component Value Date   GFRNONAA 44 (L) 03/25/2021   Patient is on Vitamin D supplement.   Lab Results  Component Value Date   VD25OH 95 06/30/2022     Medication Review:   Current Outpatient Medications (Cardiovascular):    bisoprolol (ZEBETA) 10 MG tablet, TAKE 1  TABLET BY MOUTH EVERY DAY FOR BLOOD PRESSURE   rosuvastatin (CRESTOR) 40 MG tablet, TAKE 1 TABLET BY MOUTH EVERY DAY FOR CHOLESTEROL   telmisartan (MICARDIS) 80 MG tablet, TAKE 1 TABLET BY MOUTH EVERY DAY FOR BLOOD PRESSURE   Current Outpatient Medications (Analgesics):    acetaminophen (TYLENOL) 650 MG CR tablet, Take 650 mg by mouth every 8 (eight) hours as needed for pain.   meloxicam (MOBIC) 15 MG tablet, TAKE 1 TABLET BY MOUTH DAILY AS NEEDED WITH FOOD FOR PAIN & INFLAMMATION   Current Outpatient Medications (Other):    Cholecalciferol (VITAMIN D3) 125 MCG (5000 UT) CAPS, Take 10,000 Units by mouth every morning.   clonazePAM (KLONOPIN) 1 MG tablet, TAKE 0.5-1 TABLETS (0.5-1 MG TOTAL) BY MOUTH AT BEDTIME AS NEEDED (RESTLESS LEG SYNDROME).   gabapentin (NEURONTIN) 600 MG tablet, TAKE 1/2 TO 1 TABLET 2 TO 3 TIMES DAILY AS NEEDED FOR NEUROPATHY PAIN   Krill Oil 350 MG CAPS, Take 350 mg by mouth every morning.   Magnesium 400 MG CAPS, Take 800 mg by mouth every morning.   PRESCRIPTION MEDICATION, Inhale into the lungs at bedtime. CPAP   TURMERIC PO, Take 2,600 mg by mouth in the morning and at bedtime.  Current Problems (verified) Patient Active Problem List   Diagnosis Date Noted   Carpal tunnel syndrome, right upper limb 02/24/2022   Abnormal CT of liver 04/24/2021   CKD (chronic kidney disease), stage III ([place]) 03/03/2021   Septic olecranon bursitis of left elbow    Chronic intermittent hypoxia with obstructive sleep apnea 02/10/2021   PLMD (periodic limb movement disorder) 01/08/2021   CPAP (continuous positive airway pressure) dependence 01/08/2021   Neuropathy 01/08/2021   Coronary artery calcification seen on CT scan 03/19/2020   Persistent proteinuria 02/22/2020   Aortic atherosclerosis ([place]) by Chest CT on 12.18/2020 06/30/2017   COPD (chronic obstructive pulmonary disease) ([place]) 06/13/2017   Morbid obesity ([place]) 08/26/2014   Former smoker (45+ pack year history, quit  02/2021) 08/26/2014   Essential hypertension    Hyperlipidemia, mixed    Abnormal glucose    OSA and COPD overlap syndrome ([place])    Vitamin D deficiency    DJD (degenerative joint disease)    Primary gout    ED (erectile dysfunction)     Screening Tests Immunization History  Administered Date(s) Administered   Fluad Quad(high Dose 65+) 10/03/2020   Influenza Split 08/26/2014   Influenza, High Dose Seasonal PF 08/12/2016, 10/17/2018, 09/05/2019, 09/30/2021   Influenza, Seasonal, Injecte, Preservative Fre 10/27/2015   PFIZER(Purple Top)SARS-COV-2 Vaccination 01/05/2020, 01/26/2020, 09/27/2020   PPD Test 05/23/2014, 07/23/2015   Pneumococcal Conjugate-13 08/26/2014   Pneumococcal Polysaccharide-23 05/05/2010, 01/30/2016  Td 11/30/2003   Tdap 08/12/2010, 08/21/2020, 03/01/2021   Zoster, Live 04/29/2013   Health Maintenance  Topic Date Due   Medicare Annual Wellness (AWV)  Never done   Zoster Vaccines- Shingrix (1 of 2) Never done   COVID-19 Vaccine (4 - Pfizer risk series) 11/22/2020   Lung Cancer Screening  04/22/2022   INFLUENZA VACCINE  06/29/2022   COLONOSCOPY (Pts 45-29yr Insurance coverage will need to be confirmed)  09/06/2026   TETANUS/TDAP  03/02/2031   Pneumonia Vaccine 71 Years old  Completed   Hepatitis C Screening  Completed   HPV VACCINES  Aged Out   Fecal DNA (Cologuard)  Discontinued    Allergies Allergies  Allergen Reactions   Allopurinol Rash    SURGICAL HISTORY He  has a past surgical history that includes Prostatectomy (2003); colonscopy; Colonoscopy with propofol (N/A, 09/06/2016); Skin cancer excision; and Cataract extraction, bilateral (Bilateral, 12/2020). FAMILY HISTORY His family history includes Colon cancer in his paternal grandfather; Heart attack in his maternal grandmother; Hypertension in his mother; Pulmonary fibrosis in his mother; Stroke in his father, maternal grandfather, and paternal grandmother. SOCIAL HISTORY He  reports that he  quit smoking about 19 months ago. His smoking use included cigarettes. He has a 45.00 pack-year smoking history. He has never used smokeless tobacco. He reports current alcohol use of about 15.0 standard drinks of alcohol per week. He reports that he does not use drugs.   General appearance: alert, no distress, WD/WN, male HEENT: Left temple with edematous temple, slightly tender to palpation.  Normocephalic, sclerae anicteric, TMs pearly, nares patent, no discharge or erythema, pharynx normal Oral cavity: MMM, no lesions Neck: Left neck with mild edema to left side.   Heart: RRR, normal S1, S2, no murmurs Lungs: CTA bilaterally, no wheezes, rhonchi, or rales Abdomen: +bs, soft, obese non tender, non distended, no masses, no hepatomegaly, no splenomegaly Musculoskeletal: nontender, no swelling, no obvious deformity Extremities: no edema, no cyanosis, no clubbing Pulses: 2+ symmetric, upper and lower extremities, normal cap refill Neurological: alert, oriented x 3, CN2-12 intact, strength normal upper extremities and lower extremities, sensation normal throughout, DTRs 2+ throughout, no cerebellar signs, gait normal Psychiatric: normal affect, behavior normal, pleasant  Lymph:  Left sub-mandible lymph node enlarged.  TDarrol Jump NP   10/14/2022

## 2022-10-15 ENCOUNTER — Telehealth: Payer: Self-pay

## 2022-10-15 LAB — COMPLETE METABOLIC PANEL WITH GFR
AG Ratio: 1.5 (calc) (ref 1.0–2.5)
ALT: 20 U/L (ref 9–46)
AST: 23 U/L (ref 10–35)
Albumin: 4.3 g/dL (ref 3.6–5.1)
Alkaline phosphatase (APISO): 66 U/L (ref 35–144)
BUN/Creatinine Ratio: 25 (calc) — ABNORMAL HIGH (ref 6–22)
BUN: 30 mg/dL — ABNORMAL HIGH (ref 7–25)
CO2: 27 mmol/L (ref 20–32)
Calcium: 9.8 mg/dL (ref 8.6–10.3)
Chloride: 105 mmol/L (ref 98–110)
Creat: 1.2 mg/dL (ref 0.70–1.28)
Globulin: 2.9 g/dL (calc) (ref 1.9–3.7)
Glucose, Bld: 82 mg/dL (ref 65–99)
Potassium: 5.3 mmol/L (ref 3.5–5.3)
Sodium: 141 mmol/L (ref 135–146)
Total Bilirubin: 0.6 mg/dL (ref 0.2–1.2)
Total Protein: 7.2 g/dL (ref 6.1–8.1)
eGFR: 65 mL/min/{1.73_m2} (ref >=60)

## 2022-10-15 LAB — CBC WITH DIFFERENTIAL/PLATELET
Absolute Monocytes: 702 cells/uL (ref 200–950)
Basophils Absolute: 47 cells/uL (ref 0–200)
Basophils Relative: 0.6 %
Eosinophils Absolute: 78 cells/uL (ref 15–500)
Eosinophils Relative: 1 %
HCT: 48.5 % (ref 38.5–50.0)
Hemoglobin: 16.7 g/dL (ref 13.2–17.1)
Lymphs Abs: 2332 cells/uL (ref 850–3900)
MCH: 32.2 pg (ref 27.0–33.0)
MCHC: 34.4 g/dL (ref 32.0–36.0)
MCV: 93.4 fL (ref 80.0–100.0)
MPV: 11.4 fL (ref 7.5–12.5)
Monocytes Relative: 9 %
Neutro Abs: 4641 cells/uL (ref 1500–7800)
Neutrophils Relative %: 59.5 %
Platelets: 159 10*3/uL (ref 140–400)
RBC: 5.19 10*6/uL (ref 4.20–5.80)
RDW: 13.3 % (ref 11.0–15.0)
Total Lymphocyte: 29.9 %
WBC: 7.8 10*3/uL (ref 3.8–10.8)

## 2022-10-15 LAB — C-REACTIVE PROTEIN: CRP: 3 mg/L (ref <8.0)

## 2022-10-15 LAB — LIPID PANEL
Cholesterol: 119 mg/dL (ref <200)
HDL: 36 mg/dL — ABNORMAL LOW (ref >=40)
LDL Cholesterol (Calc): 58 mg/dL (calc)
Non-HDL Cholesterol (Calc): 83 mg/dL (calc) (ref <130)
Total CHOL/HDL Ratio: 3.3 (calc) (ref <5.0)
Triglycerides: 170 mg/dL — ABNORMAL HIGH (ref <150)

## 2022-10-15 LAB — SEDIMENTATION RATE: Sed Rate: 2 mm/h (ref 0–20)

## 2022-10-15 NOTE — Telephone Encounter (Signed)
LM-10/15/22-Chart review started. Reviewing OV, Consults, Hospital visits, Labs and medication changes. Chart review for smoking cessation complete. Will call patient at another time.   Total time spent: 12 min.

## 2022-10-19 ENCOUNTER — Encounter: Payer: Self-pay | Admitting: Nurse Practitioner

## 2022-10-20 ENCOUNTER — Other Ambulatory Visit: Payer: Self-pay | Admitting: Nurse Practitioner

## 2022-10-20 DIAGNOSIS — R519 Headache, unspecified: Secondary | ICD-10-CM

## 2022-10-20 MED ORDER — PREDNISONE 20 MG PO TABS: ORAL_TABLET | ORAL | 0 refills | Status: DC

## 2022-10-25 NOTE — Telephone Encounter (Signed)
LM-10/25/22-Called pt. To complete smoking cessation review call. Unable to reach pt. [place] X1.  Total time spent:2 min.

## 2022-10-26 NOTE — Telephone Encounter (Signed)
LM-10/26/22-Called pt. To complete a Smoking Cessation Review call. Unable to reach pt. [place] X2.  Total time spent: 2 min.

## 2022-10-27 NOTE — Telephone Encounter (Signed)
LM-10/27/22-Called pt. To complete a Smoking Cessation Review call. Unable to reach pt. [place] X3.  Total time spent: 2 min.

## 2022-11-03 DIAGNOSIS — L570 Actinic keratosis: Secondary | ICD-10-CM | POA: Diagnosis not present

## 2022-11-05 ENCOUNTER — Encounter: Payer: Self-pay | Admitting: Nurse Practitioner

## 2022-11-05 DIAGNOSIS — G4452 New daily persistent headache (NDPH): Secondary | ICD-10-CM

## 2022-11-05 DIAGNOSIS — R519 Headache, unspecified: Secondary | ICD-10-CM

## 2022-11-05 DIAGNOSIS — I700 Atherosclerosis of aorta: Secondary | ICD-10-CM

## 2022-11-23 DIAGNOSIS — G4733 Obstructive sleep apnea (adult) (pediatric): Secondary | ICD-10-CM | POA: Diagnosis not present

## 2022-11-29 ENCOUNTER — Other Ambulatory Visit: Payer: Self-pay | Admitting: Orthopaedic Surgery

## 2022-12-01 NOTE — Progress Notes (Unsigned)
Assessment and Plan: Ancil was seen today for sore throat.  Diagnoses and all orders for this visit:  Essential hypertension - continue medications, DASH diet, exercise and monitor at home. Call if greater than 130/80.   Smoker Strongly encouraged to decrease and quit, not currently ready to quit  Sore throat If sore throat does not improve or symptoms worsen notify the office -     dexamethasone (DECADRON) 1 MG tablet; Take 3 tabs for 3 days, 2 tabs for 3 days 1 tab for 5 days. Take with food. -     Ambulatory referral to Gastroenterology  Gastroesophageal reflux disease with esophagitis without hemorrhage/dysphagia Avoid acidic and spicy foods Sleep up on 2 pillows Begin Protonix daily and refer to GI for further evaluation -     pantoprazole (PROTONIX) 40 MG tablet; Take 1 tablet (40 mg total) by mouth daily. -     dexamethasone (DECADRON) 1 MG tablet; Take 3 tabs for 3 days, 2 tabs for 3 days 1 tab for 5 days. Take with food. -     Ambulatory referral to Gastroenterology       Further disposition pending results of labs. Discussed med's effects and SE's.   Over 30 minutes of exam, counseling, chart review, and critical decision making was performed.   Future Appointments  Date Time Provider [place]  12/08/2022  8:40 AM [facility] 2 [facility] [facility]  12/24/2022 10:15 AM Lorretta Harp, MD [facility] None  02/02/2023 11:00 AM [name], MD [facility] None  05/11/2023  9:30 AM Darrol Jump, NP [facility] None  06/15/2023 10:30 AM Ward Givens, NP [facility] None    ------------------------------------------------------------------------------------------------------------------   HPI BP (!) 144/90   Pulse (!) 52   Temp 97.7 F (36.5 C)   Ht '5\' 7"'$  (1.702 m)   Wt 186 lb 6.4 oz (84.6 kg)   SpO2 96%   BMI 29.19 kg/m    72 y.o.male presents for sore throat x several months.  Has noticed some sinus congestion.  He is coughing up white/clear  mucus.  Denies nausea, vomiting and diarrhea   He is having more trouble swallowing solids x past couple of weeks. He denies history of GERD in the past. Does eat apicy and acidic foods.   He continues to smoke 1 PPD. Not interested in currently quitting   BP is currently well controlled on bisoprolol 10 mg QD and telmisartan 80 mg QD BP Readings from Last 3 Encounters:  12/02/22 (!) 144/90  10/14/22 132/86  06/30/22 134/76     BMI is Body mass index is 29.19 kg/m., he has been working on diet and exercise. Wt Readings from Last 3 Encounters:  12/02/22 186 lb 6.4 oz (84.6 kg)  10/14/22 193 lb 12.8 oz (87.9 kg)  06/30/22 194 lb 9.6 oz (88.3 kg)     Past Medical History:  Diagnosis Date   AKI (acute kidney injury) ([place]) 03/01/2021   Colon polyps    DJD (degenerative joint disease)    ED (erectile dysfunction)    Gout    Hyperlipemia    Hypertension    Obstructive sleep apnea    08/2012, cpap setting of 14   Prostate cancer ([place]) 01/2002   s/p prostatectomy   Vitamin D deficiency      Allergies  Allergen Reactions   Allopurinol Rash    Current Outpatient Medications on File Prior to Visit  Medication Sig   acetaminophen (TYLENOL) 650 MG CR tablet Take 650 mg by mouth  every 8 (eight) hours as needed for pain.   bisoprolol (ZEBETA) 10 MG tablet TAKE 1 TABLET BY MOUTH EVERY DAY FOR BLOOD PRESSURE   Cholecalciferol (VITAMIN D3) 125 MCG (5000 UT) CAPS Take 10,000 Units by mouth every morning.   clonazePAM (KLONOPIN) 1 MG tablet TAKE 0.5-1 TABLETS (0.5-1 MG TOTAL) BY MOUTH AT BEDTIME AS NEEDED (RESTLESS LEG SYNDROME).   gabapentin (NEURONTIN) 600 MG tablet TAKE 1/2 TO 1 TABLET 2 TO 3 TIMES DAILY AS NEEDED FOR NEUROPATHY PAIN   Krill Oil 350 MG CAPS Take 350 mg by mouth every morning.   Magnesium 400 MG CAPS Take 800 mg by mouth every morning.   meloxicam (MOBIC) 15 MG tablet TAKE 1 TABLET BY MOUTH DAILY AS NEEDED WITH FOOD FOR PAIN & INFLAMMATION   PRESCRIPTION MEDICATION  Inhale into the lungs at bedtime. CPAP   rosuvastatin (CRESTOR) 40 MG tablet TAKE 1 TABLET BY MOUTH EVERY DAY FOR CHOLESTEROL   telmisartan (MICARDIS) 80 MG tablet TAKE 1 TABLET BY MOUTH EVERY DAY FOR BLOOD PRESSURE   TURMERIC PO Take 2,600 mg by mouth in the morning and at bedtime.   No current facility-administered medications on file prior to visit.    ROS: all negative except above.   Physical Exam:  BP (!) 144/90   Pulse (!) 52   Temp 97.7 F (36.5 C)   Ht '5\' 7"'$  (1.702 m)   Wt 186 lb 6.4 oz (84.6 kg)   SpO2 96%   BMI 29.19 kg/m   General Appearance: Well nourished, in no apparent distress. Eyes: PERRLA, EOMs, conjunctiva no swelling or erythema Sinuses: No Frontal/maxillary tenderness ENT/Mouth: Ext aud canals clear, TMs without erythema, bulging. Mild erythema, but no swelling, or exudate on post pharynx.  Neck: Supple, thyroid normal.  Respiratory: Respiratory effort normal, BS equal bilaterally without rales, rhonchi, wheezing or stridor. Raspy voice Cardio: RRR with no MRGs. Brisk peripheral pulses without edema.  Abdomen: Soft, + BS.  Non tender, no guarding, rebound, hernias, masses. Lymphatics: Non tender without lymphadenopathy.  Musculoskeletal: Full ROM, 5/5 strength, normal gait.  Skin: Warm, dry without rashes, lesions, ecchymosis.  Neuro: Cranial nerves intact. Normal muscle tone, no cerebellar symptoms. Sensation intact.  Psych: Awake and oriented X 3, normal affect, Insight and Judgment appropriate.     Alycia Rossetti, NP 11:51 AM [facility]

## 2022-12-02 ENCOUNTER — Ambulatory Visit (INDEPENDENT_AMBULATORY_CARE_PROVIDER_SITE_OTHER): Payer: Medicare Other | Admitting: Nurse Practitioner

## 2022-12-02 ENCOUNTER — Encounter: Payer: Self-pay | Admitting: Nurse Practitioner

## 2022-12-02 VITALS — BP 144/90 | HR 52 | Temp 97.7°F | Ht 67.0 in | Wt 186.4 lb

## 2022-12-02 DIAGNOSIS — R1310 Dysphagia, unspecified: Secondary | ICD-10-CM

## 2022-12-02 DIAGNOSIS — J029 Acute pharyngitis, unspecified: Secondary | ICD-10-CM | POA: Diagnosis not present

## 2022-12-02 DIAGNOSIS — F17200 Nicotine dependence, unspecified, uncomplicated: Secondary | ICD-10-CM | POA: Diagnosis not present

## 2022-12-02 DIAGNOSIS — I10 Essential (primary) hypertension: Secondary | ICD-10-CM | POA: Diagnosis not present

## 2022-12-02 DIAGNOSIS — K2100 Gastro-esophageal reflux disease with esophagitis, without bleeding: Secondary | ICD-10-CM | POA: Diagnosis not present

## 2022-12-02 MED ORDER — PANTOPRAZOLE SODIUM 40 MG PO TBEC: 40.0000 mg | DELAYED_RELEASE_TABLET | Freq: Every day | ORAL | 1 refills | Status: DC

## 2022-12-02 MED ORDER — DEXAMETHASONE 1 MG PO TABS: ORAL_TABLET | ORAL | 0 refills | Status: DC

## 2022-12-02 NOTE — Patient Instructions (Signed)
Start Protonix daily Sleep up on pillows Refer to GI for further evaluation  Gastroesophageal Reflux Disease, Adult  Gastroesophageal reflux (GER) happens when acid from the stomach flows up into the tube that connects the mouth and the stomach (esophagus). Normally, food travels down the esophagus and stays in the stomach to be digested. With GER, food and stomach acid sometimes move back up into the esophagus. You may have a disease called gastroesophageal reflux disease (GERD) if the reflux: Happens often. Causes frequent or very bad symptoms. Causes problems such as damage to the esophagus. When this happens, the esophagus becomes sore and swollen. Over time, GERD can make small holes (ulcers) in the lining of the esophagus. What are the causes? This condition is caused by a problem with the muscle between the esophagus and the stomach. When this muscle is weak or not normal, it does not close properly to keep food and acid from coming back up from the stomach. The muscle can be weak because of: Tobacco use. Pregnancy. Having a certain type of hernia (hiatal hernia). Alcohol use. Certain foods and drinks, such as coffee, chocolate, onions, and peppermint. What increases the risk? Being overweight. Having a disease that affects your connective tissue. Taking NSAIDs, such a ibuprofen. What are the signs or symptoms? Heartburn. Difficult or painful swallowing. The feeling of having a lump in the throat. A bitter taste in the mouth. Bad breath. Having a lot of saliva. Having an upset or bloated stomach. Burping. Chest pain. Different conditions can cause chest pain. Make sure you see your doctor if you have chest pain. Shortness of breath or wheezing. A long-term cough or a cough at night. Wearing away of the surface of teeth (tooth enamel). Weight loss. How is this treated? Making changes to your diet. Taking medicine. Having surgery. Treatment will depend on how bad your  symptoms are. Follow these instructions at home: Eating and drinking  Follow a diet as told by your doctor. You may need to avoid foods and drinks such as: Coffee and tea, with or without caffeine. Drinks that contain alcohol. Energy drinks and sports drinks. Bubbly (carbonated) drinks or sodas. Chocolate and cocoa. Peppermint and mint flavorings. Garlic and onions. Horseradish. Spicy and acidic foods. These include peppers, chili powder, curry powder, vinegar, hot sauces, and BBQ sauce. Citrus fruit juices and citrus fruits, such as oranges, lemons, and limes. Tomato-based foods. These include red sauce, chili, salsa, and pizza with red sauce. Fried and fatty foods. These include donuts, french fries, potato chips, and high-fat dressings. High-fat meats. These include hot dogs, rib eye steak, sausage, ham, and bacon. High-fat dairy items, such as whole milk, butter, and cream cheese. Eat small meals often. Avoid eating large meals. Avoid drinking large amounts of liquid with your meals. Avoid eating meals during the 2-3 hours before bedtime. Avoid lying down right after you eat. Do not exercise right after you eat. Lifestyle  Do not smoke or use any products that contain nicotine or tobacco. If you need help quitting, ask your doctor. Try to lower your stress. If you need help doing this, ask your doctor. If you are overweight, lose an amount of weight that is healthy for you. Ask your doctor about a safe weight loss goal. General instructions Pay attention to any changes in your symptoms. Take over-the-counter and prescription medicines only as told by your doctor. Do not take aspirin, ibuprofen, or other NSAIDs unless your doctor says it is okay. Wear loose clothes. Do not  wear anything tight around your waist. Raise (elevate) the head of your bed about 6 inches (15 cm). You may need to use a wedge to do this. Avoid bending over if this makes your symptoms worse. Keep all  follow-up visits. Contact a doctor if: You have new symptoms. You lose weight and you do not know why. You have trouble swallowing or it hurts to swallow. You have wheezing or a cough that keeps happening. You have a hoarse voice. Your symptoms do not get better with treatment. Get help right away if: You have sudden pain in your arms, neck, jaw, teeth, or back. You suddenly feel sweaty, dizzy, or light-headed. You have chest pain or shortness of breath. You vomit and the vomit is green, yellow, or black, or it looks like blood or coffee grounds. You faint. Your poop (stool) is red, bloody, or black. You cannot swallow, drink, or eat. These symptoms may represent a serious problem that is an emergency. Do not wait to see if the symptoms will go away. Get medical help right away. Call your local emergency services (911 in the [place] Do not drive yourself to the hospital. Summary If a person has gastroesophageal reflux disease (GERD), food and stomach acid move back up into the esophagus and cause symptoms or problems such as damage to the esophagus. Treatment will depend on how bad your symptoms are. Follow a diet as told by your doctor. Take all medicines only as told by your doctor. This information is not intended to replace advice given to you by your health care provider. Make sure you discuss any questions you have with your health care provider. Document Revised: 05/26/2020 Document Reviewed: 05/26/2020 Elsevier Patient Education  [place].

## 2022-12-03 ENCOUNTER — Telehealth: Payer: Self-pay

## 2022-12-03 NOTE — Telephone Encounter (Signed)
LM-12/03/22-Called pt. To complete a smoking cessation review call. LVMTRC X1. (2 min.)

## 2022-12-07 NOTE — Telephone Encounter (Signed)
LM-12/07/22-Called pt. To complete a smoking cessation assessment. LVMTRC X2. (2 min.)

## 2022-12-08 ENCOUNTER — Ambulatory Visit
Admission: RE | Admit: 2022-12-08 | Discharge: 2022-12-08 | Disposition: A | Discharge status: Home / Self Care | Payer: Medicare Other | Source: Ambulatory Visit | Attending: Nurse Practitioner | Admitting: Nurse Practitioner

## 2022-12-08 ENCOUNTER — Other Ambulatory Visit: Payer: Self-pay | Admitting: Nurse Practitioner

## 2022-12-08 ENCOUNTER — Encounter: Payer: Self-pay | Admitting: Nurse Practitioner

## 2022-12-08 ENCOUNTER — Ambulatory Visit: Payer: Medicare Other | Admitting: Orthopaedic Surgery

## 2022-12-08 DIAGNOSIS — R519 Headache, unspecified: Secondary | ICD-10-CM

## 2022-12-08 DIAGNOSIS — I700 Atherosclerosis of aorta: Secondary | ICD-10-CM

## 2022-12-08 DIAGNOSIS — G4452 New daily persistent headache (NDPH): Secondary | ICD-10-CM

## 2022-12-08 NOTE — Telephone Encounter (Signed)
LM-12/08/22-Called pt. And completed a smoking cessation review call. Pt. Reported that he is still smoking 1PPD and has been thinking about quitting d/t the prices. Pt. Concerns quitting- he reported he has been smoking for over 55 years now and he just does not think it will benefit his health any to quit now. Pt. quit 2 or 3 times. Last april he quit for a month, when he got out the hospital he didn't have that strong of an urge, went for 3 more weeks, and picked them back up and he is unsure why. Pt. Is unsure what changes he can make to help him quit smoking. Pt. Feels that it is not important for him to quit at this time. Informed pt. about free resources from [email or web address] and advised him to reach out if he needs any assistance. Pt. verbalized understanding and agreed. (23 min.)

## 2022-12-09 ENCOUNTER — Other Ambulatory Visit: Payer: Self-pay | Admitting: Nurse Practitioner

## 2022-12-09 DIAGNOSIS — R519 Headache, unspecified: Secondary | ICD-10-CM

## 2022-12-09 DIAGNOSIS — G4452 New daily persistent headache (NDPH): Secondary | ICD-10-CM

## 2022-12-23 ENCOUNTER — Encounter: Payer: Medicare Other | Admitting: Internal Medicine

## 2022-12-24 ENCOUNTER — Ambulatory Visit: Payer: Medicare Other | Attending: Cardiovascular Disease | Admitting: Cardiovascular Disease

## 2022-12-24 ENCOUNTER — Encounter: Payer: Self-pay | Admitting: Cardiovascular Disease

## 2022-12-24 ENCOUNTER — Other Ambulatory Visit: Payer: Self-pay | Admitting: Nurse Practitioner

## 2022-12-24 VITALS — BP 132/64 | HR 53 | Ht 67.0 in | Wt 182.6 lb

## 2022-12-24 DIAGNOSIS — I10 Essential (primary) hypertension: Secondary | ICD-10-CM | POA: Diagnosis not present

## 2022-12-24 DIAGNOSIS — I2510 Atherosclerotic heart disease of native coronary artery without angina pectoris: Secondary | ICD-10-CM

## 2022-12-24 DIAGNOSIS — Z87891 Personal history of nicotine dependence: Secondary | ICD-10-CM

## 2022-12-24 DIAGNOSIS — K2100 Gastro-esophageal reflux disease with esophagitis, without bleeding: Secondary | ICD-10-CM

## 2022-12-24 DIAGNOSIS — E782 Mixed hyperlipidemia: Secondary | ICD-10-CM | POA: Diagnosis not present

## 2022-12-24 NOTE — Assessment & Plan Note (Signed)
History of hyperlipidemia on high-dose statin therapy lipid profile performed 10/14/2022 revealing total cholesterol 119, LDL 58 and HDL 36.

## 2022-12-24 NOTE — Assessment & Plan Note (Signed)
History of coronary calcification seen initially on chest CT.  Coronary calcium score revealed a coronary calcium of 302 on 03/1220.  Subsequent coronary CTA on 05/10/2020 showed no obstructive disease.  Patient denies chest pain or shortness of breath.

## 2022-12-24 NOTE — Progress Notes (Signed)
12/24/2022 Stephen Holland   11/10/1951  [id number]  Primary Physician Stephen Pinto, MD Primary Cardiologist: Stephen Harp MD Stephen Holland, [place], [place]  HPI:  Stephen Holland is a 72 y.o.   morbidly overweight married Caucasian male father of 2, grandfather of 2 grandchildren referred by Dr. Melford Holland because of progressive dyspnea and lower extremity edema.  I last saw him in the office 05/29/2021.  His cardiac risk factor profile is notable for 50-75-pack-year tobacco abuse currently smoking a pack and a half a day.  He also drinks 2-3 hard drinks a night (bourbon).  He has treated hypertension hyperlipidemia.  There is no family history for heart disease.  Is never had a heart attack or stroke.  Just denies chest pain but has had progressive dyspnea over the 3 months prior to his last office visit as well as some edema.  Perform 2D echocardiography which is essentially normal except for grade 1 diastolic dysfunction.  Coronary CTA showed no evidence of obstructive disease although his coronary calcium score is measured at 302.  Does continue to smoke 1 pack/day.  He wears CPAP for obstructive sleep apnea.  He was hospitalized for several days for a left upper extremity infection.  Since I saw him a year and a half ago he is remained stable.  He denies chest pain or shortness of breath.  He is being worked up for an esophageal issue.  He does however continue to smoke a pack a day.  He is has lost 20 pounds as a result of dietary modification.   Current Meds  Medication Sig   acetaminophen (TYLENOL) 650 MG CR tablet Take 650 mg by mouth every 8 (eight) hours as needed for pain.   bisoprolol (ZEBETA) 10 MG tablet TAKE 1 TABLET BY MOUTH EVERY DAY FOR BLOOD PRESSURE   Cholecalciferol (VITAMIN D3) 125 MCG (5000 UT) CAPS Take 10,000 Units by mouth every morning.   clonazePAM (KLONOPIN) 1 MG tablet TAKE 0.5-1 TABLETS (0.5-1 MG TOTAL) BY MOUTH AT BEDTIME AS NEEDED (RESTLESS LEG SYNDROME).    gabapentin (NEURONTIN) 600 MG tablet TAKE 1/2 TO 1 TABLET 2 TO 3 TIMES DAILY AS NEEDED FOR NEUROPATHY PAIN   Krill Oil 350 MG CAPS Take 350 mg by mouth every morning.   Magnesium 400 MG CAPS Take 800 mg by mouth every morning.   meloxicam (MOBIC) 15 MG tablet TAKE 1 TABLET BY MOUTH DAILY AS NEEDED WITH FOOD FOR PAIN & INFLAMMATION   pantoprazole (PROTONIX) 40 MG tablet TAKE 1 TABLET BY MOUTH EVERY DAY   rosuvastatin (CRESTOR) 40 MG tablet TAKE 1 TABLET BY MOUTH EVERY DAY FOR CHOLESTEROL   telmisartan (MICARDIS) 80 MG tablet TAKE 1 TABLET BY MOUTH EVERY DAY FOR BLOOD PRESSURE   TURMERIC PO Take 2,600 mg by mouth in the morning and at bedtime.     Allergies  Allergen Reactions   Allopurinol Rash    Social History   Socioeconomic History   Marital status: Married    Spouse name: Stephen Holland   Number of children: 2   Years of education: 12   Highest education level: Not on file  Occupational History    Comment: truck driver  Tobacco Use   Smoking status: Every Day    Packs/day: 1.00    Types: Cigarettes   Smokeless tobacco: Never  Vaping Use   Vaping Use: Never used  Substance and Sexual Activity   Alcohol use: Yes    Alcohol/week: 15.0 standard drinks of  alcohol    Types: 15 Standard drinks or equivalent per week    Comment: 03/31/20  2-3 mixed drinks per day   Drug use: No   Sexual activity: Not on file  Other Topics Concern   Not on file  Social History Narrative   Lives home with wife. Retired.  Education HS.  Children 2     Caffeine 6 cups coffee daily, 2-3 glasses of tea and one coke per day   Social Determinants of Health   Financial Resource Strain: Not on file  Food Insecurity: No Food Insecurity (02/04/2022)   Hunger Vital Sign    Worried About Running Out of Food in the Last Year: Never true    Ran Out of Food in the Last Year: Never true  Transportation Needs: No Transportation Needs (11/10/2021)   PRAPARE - Hydrologist (Medical): No     Lack of Transportation (Non-Medical): No  Physical Activity: Not on file  Stress: Not on file  Social Connections: Not on file  Intimate Partner Violence: Not on file     Review of Systems: General: negative for chills, fever, night sweats or weight changes.  Cardiovascular: negative for chest pain, dyspnea on exertion, edema, orthopnea, palpitations, paroxysmal nocturnal dyspnea or shortness of breath Dermatological: negative for rash Respiratory: negative for cough or wheezing Urologic: negative for hematuria Abdominal: negative for nausea, vomiting, diarrhea, bright red blood per rectum, melena, or hematemesis Neurologic: negative for visual changes, syncope, or dizziness All other systems reviewed and are otherwise negative except as noted above.    Blood pressure 132/64, pulse (!) 53, height '5\' 7"'$  (1.702 m), weight 182 lb 9.6 oz (82.8 kg), SpO2 99 %.  General appearance: alert and no distress Neck: no adenopathy, no carotid bruit, no JVD, supple, symmetrical, trachea midline, and thyroid not enlarged, symmetric, no tenderness/mass/nodules Lungs: clear to auscultation bilaterally Heart: regular rate and rhythm, S1, S2 normal, no murmur, click, rub or gallop Extremities: extremities normal, atraumatic, no cyanosis or edema Pulses: 2+ and symmetric Skin: Skin color, texture, turgor normal. No rashes or lesions Neurologic: Grossly normal  EKG sinus bradycardia 53 without ST or T wave changes.  Personally reviewed this EKG.  This is sinus several things from every 6 this Plavix nonreactivity so if it less than 208 is greater than 2 awaited 3+ nonreactive what is the History 19-30 okay 16 he had Stephen Holland short of breath imparts a really see significant whether this patient needs Stephen Holland history of.  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  Hyperlipidemia, mixed History of hyperlipidemia on high-dose statin therapy lipid profile performed 10/14/2022 revealing total cholesterol 119, LDL 58 and HDL 36.  Former smoker (45+ pack year history, quit 02/2021) History of ongoing tobacco abuse 1 pack/day recalcitrant to risk factor modification.  Coronary artery calcification seen on CT scan History of coronary calcification seen initially on chest CT.  Coronary calcium score revealed a coronary calcium of 302 on 03/1220.  Subsequent coronary CTA on 05/10/2020 showed no obstructive disease.  Patient denies chest pain or shortness of breath.     Stephen Harp MD FACP,FACC,FAHA, [facility] 12/24/2022 10:48 AM

## 2022-12-24 NOTE — Assessment & Plan Note (Signed)
History of ongoing tobacco abuse 1 pack/day recalcitrant to risk factor modification.

## 2022-12-24 NOTE — Assessment & Plan Note (Addendum)
History of essential hypertension blood pressure measured today at 132/64 he is on bisoprolol and Micardis.

## 2022-12-24 NOTE — Patient Instructions (Signed)
Medication Instructions:  Your physician recommends that you continue on your current medications as directed. Please refer to the Current Medication list given to you today.  *If you need a refill on your cardiac medications before your next appointment, please call your pharmacy*   Follow-Up: At [facility], you and your health needs are our priority.  As part of our continuing mission to provide you with exceptional heart care, we have created designated Provider Care Teams.  These Care Teams include your primary Cardiologist (physician) and Advanced Practice Providers (APPs -  Physician Assistants and Nurse Practitioners) who all work together to provide you with the care you need, when you need it.  We recommend signing up for the patient portal called "MyChart".  Sign up information is provided on this After Visit Summary.  MyChart is used to connect with patients for Virtual Visits (Telemedicine).  Patients are able to view lab/test results, encounter notes, upcoming appointments, etc.  Non-urgent messages can be sent to your provider as well.   To learn more about what you can do with MyChart, go to [email or web address].    Your next appointment:   12 month(s)  Provider:   Quay Burow, MD

## 2022-12-29 DIAGNOSIS — N1831 Chronic kidney disease, stage 3a: Secondary | ICD-10-CM

## 2022-12-29 DIAGNOSIS — I10 Essential (primary) hypertension: Secondary | ICD-10-CM

## 2023-01-05 DIAGNOSIS — R1312 Dysphagia, oropharyngeal phase: Secondary | ICD-10-CM | POA: Diagnosis not present

## 2023-01-05 DIAGNOSIS — K7460 Unspecified cirrhosis of liver: Secondary | ICD-10-CM | POA: Diagnosis not present

## 2023-01-07 ENCOUNTER — Telehealth (HOSPITAL_COMMUNITY): Payer: Self-pay | Admitting: *Deleted

## 2023-01-07 ENCOUNTER — Other Ambulatory Visit (HOSPITAL_COMMUNITY): Payer: Self-pay | Admitting: *Deleted

## 2023-01-07 DIAGNOSIS — R1312 Dysphagia, oropharyngeal phase: Secondary | ICD-10-CM | POA: Diagnosis not present

## 2023-01-07 DIAGNOSIS — K259 Gastric ulcer, unspecified as acute or chronic, without hemorrhage or perforation: Secondary | ICD-10-CM | POA: Diagnosis not present

## 2023-01-07 DIAGNOSIS — R1310 Dysphagia, unspecified: Secondary | ICD-10-CM

## 2023-01-07 DIAGNOSIS — K2930 Chronic superficial gastritis without bleeding: Secondary | ICD-10-CM | POA: Diagnosis not present

## 2023-01-07 DIAGNOSIS — I8510 Secondary esophageal varices without bleeding: Secondary | ICD-10-CM | POA: Diagnosis not present

## 2023-01-07 DIAGNOSIS — B9681 Helicobacter pylori [H. pylori] as the cause of diseases classified elsewhere: Secondary | ICD-10-CM | POA: Diagnosis not present

## 2023-01-07 DIAGNOSIS — K7460 Unspecified cirrhosis of liver: Secondary | ICD-10-CM | POA: Diagnosis not present

## 2023-01-07 NOTE — Telephone Encounter (Signed)
Attempted to contact patient to schedule OP MBS. Left VM. RKEEL 

## 2023-01-12 ENCOUNTER — Other Ambulatory Visit: Payer: Self-pay | Admitting: Orthopaedic Surgery

## 2023-01-12 ENCOUNTER — Other Ambulatory Visit: Payer: Self-pay | Admitting: Nurse Practitioner

## 2023-01-12 DIAGNOSIS — G2581 Restless legs syndrome: Secondary | ICD-10-CM

## 2023-01-13 ENCOUNTER — Ambulatory Visit (HOSPITAL_COMMUNITY)
Admission: RE | Admit: 2023-01-13 | Discharge: 2023-01-13 | Disposition: A | Discharge status: Home / Self Care | Payer: Medicare Other | Source: Ambulatory Visit | Attending: Internal Medicine | Admitting: Internal Medicine

## 2023-01-13 ENCOUNTER — Ambulatory Visit (HOSPITAL_COMMUNITY)
Admission: RE | Admit: 2023-01-13 | Discharge: 2023-01-13 | Disposition: A | Discharge status: Home / Self Care | Payer: Medicare Other | Source: Ambulatory Visit

## 2023-01-13 DIAGNOSIS — R1310 Dysphagia, unspecified: Secondary | ICD-10-CM | POA: Diagnosis not present

## 2023-01-13 NOTE — Therapy (Signed)
Modified Barium Swallow Study  Patient Details  Name: Stephen Holland MRN: [id number] Date of Birth: 1950-12-04  Today's Date: 01/13/2023  Modified Barium Swallow completed.  Full report located under Chart Review in the Imaging Section.  History of Present Illness Stephen Holland is a 72 y.o. male with PMH: tobacco use (1ppd), AKI, DJD, HL, OSA using CPAP, gout, prostate cancer. He has had about a 5 month h/o sore throat with associated difficulty swallowing which he said has been getting worse of late. He was referred to GI by PCP. EGD was completed by Stephen Holland and patient was started on PPI. SLP unable to view EGD results but patient stated that they found two ulcers in lower esophagus and a small polyp in pharynx with EGD note from 01/07/23 reporting "showed likely benign nodule in the posterior pharynx." with recommendation to consult ENT.   Clinical Impression Stephen Holland presents with a pharyngeal phase dysphagia as per this MBS. Penetration above the vocal cords occured with thin and nectar thick liquids but penetrate remained well above the vocal cords and no aspiration seen. During majority of swallows, patient's epiglottis did not invert and overall mobility of epiglottis was minimal. Swallow initiation was delayed at level of vallecular sinus with thin liquids but to pyriform sinus with nectar thick liquids. Arytenoids appeared with edema. Laryngeal vestibular closure was incomplete, leading to penetration but no aspiration with both thin and nectar thick liquids. He had trace to mild barium residuals remaining in pharynx in vallecular sinus, pyriform sinus and aryepiglottic folds with nectar thick and thin liquid barium consistencies. 1m barium tablet transited through pharynx and through esophagus without difficulty. SLP in agreement with recommendation from GI for ENT consult. In addition, if patient's dysphagia symptoms do not improve or if they worsen, recommendation is for OP  SLP. Factors that may increase risk of adverse event in presence of aspiration (Stephen Holland& Stephen Holland 2021):    Swallow Evaluation Recommendations Recommendations: PO diet PO Diet Recommendation: Dysphagia 3 (Mechanical soft);Thin liquids (Level 0) Liquid Administration via: Cup;Straw Medication Administration: Whole meds with liquid Supervision: Patient able to self-feed Postural changes: Stay upright 30-60 min after meals;Position pt fully upright for meals Recommended consults: Consider ENT consultation      Stephen Baller MA, CCC-SLP Speech Therapy

## 2023-01-14 DIAGNOSIS — B9681 Helicobacter pylori [H. pylori] as the cause of diseases classified elsewhere: Secondary | ICD-10-CM | POA: Diagnosis not present

## 2023-01-14 DIAGNOSIS — K2930 Chronic superficial gastritis without bleeding: Secondary | ICD-10-CM | POA: Diagnosis not present

## 2023-01-19 ENCOUNTER — Telehealth: Payer: Self-pay

## 2023-01-19 NOTE — Progress Notes (Signed)
8:38AM Patient: Stephen Holland DOB: 04/26/1951  HC calling pt to schedule FPO in April. Pt requested to have outreach on a Thursday. Pt scheduled 03/24/23.   Total Time Spent: 47mn

## 2023-01-24 ENCOUNTER — Encounter: Payer: Self-pay | Admitting: Nurse Practitioner

## 2023-01-24 ENCOUNTER — Other Ambulatory Visit: Payer: Self-pay

## 2023-01-24 ENCOUNTER — Ambulatory Visit (INDEPENDENT_AMBULATORY_CARE_PROVIDER_SITE_OTHER): Payer: Medicare Other | Admitting: Nurse Practitioner

## 2023-01-24 VITALS — BP 134/74 | HR 60 | Temp 97.6°F | Ht 67.0 in | Wt 176.8 lb

## 2023-01-24 DIAGNOSIS — R1310 Dysphagia, unspecified: Secondary | ICD-10-CM

## 2023-01-24 DIAGNOSIS — Z1152 Encounter for screening for COVID-19: Secondary | ICD-10-CM

## 2023-01-24 DIAGNOSIS — R519 Headache, unspecified: Secondary | ICD-10-CM

## 2023-01-24 DIAGNOSIS — R6889 Other general symptoms and signs: Secondary | ICD-10-CM

## 2023-01-24 LAB — POCT INFLUENZA A/B
Influenza A, POC: NEGATIVE
Influenza B, POC: NEGATIVE

## 2023-01-24 LAB — POC COVID19 BINAXNOW: SARS Coronavirus 2 Ag: NEGATIVE

## 2023-01-24 MED ORDER — HYOSCYAMINE SULFATE 0.125 MG PO TABS: 0.1250 mg | ORAL_TABLET | ORAL | 2 refills | Status: DC | PRN

## 2023-01-24 NOTE — Patient Instructions (Signed)
Dysphagia  Dysphagia is trouble swallowing. This condition occurs when solids and liquids stick in a person's throat on the way down to the stomach, or when food takes longer to get to the stomach than usual. You may have problems swallowing food, liquids, or both. You may also have pain while trying to swallow. It may take you more time and effort to swallow something. What are the causes? This condition may be caused by: Muscle problems. These may make it difficult for you to move food and liquids through the esophagus, which is the tube that connects your mouth to your stomach. Blockages. You may have ulcers, scar tissue, or inflammation that blocks the normal passage of food and liquids. Causes of these problems include: Acid reflux from your stomach into your esophagus (gastroesophageal reflux). Infections. Radiation treatment for cancer. Medicines taken without enough fluids to wash them down into your stomach. Stroke. This can affect the nerves and make it difficult to swallow. Nerve problems. These prevent signals from being sent to the muscles of your esophagus to squeeze (contract) and move what you swallow down to your stomach. Globus pharyngeus. This is a common problem that involves a feeling like something is stuck in your throat or a sense of trouble with swallowing, even though nothing is wrong with the swallowing passages. Certain conditions, such as cerebral palsy or Parkinson's disease. What are the signs or symptoms? Common symptoms of this condition include: A feeling that solids or liquids are stuck in your throat on the way down to the stomach. Pain while swallowing. Coughing or gagging while trying to swallow. Other symptoms include: Food moving back from your stomach to your mouth (regurgitation). Noises coming from your throat. Chest discomfort when swallowing. A feeling of fullness when swallowing. Drooling, especially when the throat is blocked. Heartburn. How  is this diagnosed? This condition may be diagnosed by: Barium swallow X-ray. In this test, you will swallow a white liquid that sticks to the inside of your esophagus. X-ray images are then taken. Endoscopy. In this test, a flexible telescope is inserted down your throat to look at your esophagus and your stomach. CT scans or an MRI. How is this treated? Treatment for dysphagia depends on the cause of this condition: If the dysphagia is caused by acid reflux or infection, medicines may be used. These may include antibiotics or heartburn medicines. If the dysphagia is caused by problems with the muscles, swallowing therapy may be used to help you strengthen your swallowing muscles. You may have to do specific exercises to strengthen the muscles or stretch them. If the dysphagia is caused by a blockage or mass, procedures to remove the blockage may be done. You may need surgery and a feeding tube. You may need to make diet changes. Ask your health care provider for specific instructions. Follow these instructions at home: Medicines Take over-the-counter and prescription medicines only as told by your health care provider. If you were prescribed an antibiotic medicine, take it as told by your health care provider. Do not stop taking the antibiotic even if you start to feel better. Eating and drinking  Make any diet changes as told by your health care provider. Work with a diet and nutrition specialist (dietitian) to create an eating plan that will help you get the nutrients you need in order to stay healthy. Eat soft foods that are easier to swallow. Cut your food into small pieces and eat slowly. Take small bites. Eat and drink only when you  are sitting upright. Do not drink alcohol or caffeine. If you need help quitting, ask your health care provider. General instructions Check your weight every day to make sure you are not losing weight. Do not use any products that contain nicotine or  tobacco. These products include cigarettes, chewing tobacco, and vaping devices, such as e-cigarettes. If you need help quitting, ask your health care provider. Keep all follow-up visits. This is important. Contact a health care provider if: You lose weight because you cannot swallow. You cough when you drink liquids. You cough up partially digested food. Get help right away if: You cannot swallow your saliva. You have shortness of breath, a fever, or both. Your voice is hoarse and you have trouble swallowing. These symptoms may represent a serious problem that is an emergency. Do not wait to see if the symptoms will go away. Get medical help right away. Call your local emergency services (911 in the [place] Do not drive yourself to the hospital. Summary Dysphagia is trouble swallowing. This condition occurs when solids and liquids stick in a person's throat on the way down to the stomach. You may cough or gag while trying to swallow. Dysphagia has many possible causes. Treatment for dysphagia depends on the cause of the condition. Keep all follow-up visits. This is important. This information is not intended to replace advice given to you by your health care provider. Make sure you discuss any questions you have with your health care provider. Document Revised: 07/05/2020 Document Reviewed: 07/05/2020 Elsevier Patient Education  [place].

## 2023-01-24 NOTE — Progress Notes (Signed)
Assessment and Plan:  Isaul was seen today for acute visit.  Diagnoses and all orders for this visit:  Flu-like symptoms -     POCT Influenza A/B- negative  Encounter for screening for COVID-19 -     POC COVID-19- negative  Dysphagia, unspecified type Do 2 protein shakes a day since unable to get very little food ingested Strongly encouraged to go to ER GI has pt on Amoxicillin and Clarithromycin, had normal barium swallow If gets worse go to the ER -     Ambulatory referral to ENT- URGENT -     hyoscyamine (LEVSIN) 0.125 MG tablet; Take 1 tablet (0.125 mg total) by mouth every 4 (four) hours as needed for cramping (diarrhea, nausea).  Headache disorder Continue Tylenol Keep neurology appt 02/03/23- normal CT scan If pain becomes severe, develops vision changes go to the ER    Further disposition pending results of labs. Discussed med's effects and SE's.   Over 30 minutes of exam, counseling, chart review, and critical decision making was performed.   Future Appointments  Date Time Provider [place]  02/02/2023 11:00 AM [name], MD [facility] None  02/03/2023  1:30 PM Penumalli, Earlean Polka, MD [facility] None  05/11/2023  9:30 AM Darrol Jump, NP [facility] None  06/15/2023 10:30 AM Ward Givens, NP [facility] None    ------------------------------------------------------------------------------------------------------------------   HPI BP 134/74   Pulse 60   Temp 97.6 F (36.4 C)   Ht '5\' 7"'$  (1.702 m)   Wt 176 lb 12.8 oz (80.2 kg)   SpO2 97%   BMI 27.69 kg/m   72 y.o.male presents for runny nose, sore throat, fatigue and headache since Oct 2023. States swollen gland on left side of throat. Headache on left side of head. CT scan was negative.  GI found polyp in throat. Did a barium swallow test 01/13/23. GI says it was not abnormal and suggest ENT.  When he tries to eat, after 6-8 bites his throat gets very sore and cannot eat any more and pain will go around  to the back of his head. Currently on Amoxicillin. He is able to drink a protein shake .  Small food like piece of rice, small piece of hamburger seems to get stuck more  He has a constant mild headache that can become severe. Has tried muscle relaxant with no relief. He has an appointment with neurology 02/03/23.   BMI is Body mass index is 27.69 kg/m., he has not been working on diet and exercise. Wt Readings from Last 3 Encounters:  01/24/23 176 lb 12.8 oz (80.2 kg)  12/24/22 182 lb 9.6 oz (82.8 kg)  12/02/22 186 lb 6.4 oz (84.6 kg)     Past Medical History:  Diagnosis Date   AKI (acute kidney injury) ([place]) 03/01/2021   Colon polyps    DJD (degenerative joint disease)    ED (erectile dysfunction)    Gout    Hyperlipemia    Hypertension    Obstructive sleep apnea    08/2012, cpap setting of 14   Prostate cancer ([place]) 01/2002   s/p prostatectomy   Vitamin D deficiency      Allergies  Allergen Reactions   Allopurinol Rash    Current Outpatient Medications on File Prior to Visit  Medication Sig   acetaminophen (TYLENOL) 650 MG CR tablet Take 650 mg by mouth every 8 (eight) hours as needed for pain.   amoxicillin (AMOXIL) 500 MG capsule 2 capsules Orally every 12 hrs for 14 days  bisoprolol (ZEBETA) 10 MG tablet TAKE 1 TABLET BY MOUTH EVERY DAY FOR BLOOD PRESSURE   Cholecalciferol (VITAMIN D3) 125 MCG (5000 UT) CAPS Take 10,000 Units by mouth every morning.   clarithromycin (BIAXIN) 500 MG tablet 1 tablet Orally every 12 hrs for 14 days   clonazePAM (KLONOPIN) 1 MG tablet TAKE 1/2 TO 1 TABLETS BY MOUTH AT BEDTIME AS NEEDED (RESTLESS LEG SYNDROME).   gabapentin (NEURONTIN) 600 MG tablet TAKE 1/2 TO 1 TABLET 2 TO 3 TIMES DAILY AS NEEDED FOR NEUROPATHY PAIN   Krill Oil 350 MG CAPS Take 350 mg by mouth every morning.   Magnesium 400 MG CAPS Take 800 mg by mouth every morning.   meloxicam (MOBIC) 15 MG tablet TAKE 1 TABLET BY MOUTH DAILY AS NEEDED WITH FOOD FOR PAIN & INFLAMMATION    pantoprazole (PROTONIX) 40 MG tablet TAKE 1 TABLET BY MOUTH EVERY DAY   rosuvastatin (CRESTOR) 40 MG tablet TAKE 1 TABLET BY MOUTH EVERY DAY FOR CHOLESTEROL   telmisartan (MICARDIS) 80 MG tablet TAKE 1 TABLET BY MOUTH EVERY DAY FOR BLOOD PRESSURE   TURMERIC PO Take 2,600 mg by mouth in the morning and at bedtime.   doxycycline (VIBRA-TABS) 100 MG tablet TAKE 1 TABLET BY MOUTH TWICE A DAY (Patient not taking: Reported on 01/24/2023)   No current facility-administered medications on file prior to visit.    ROS: all negative except above.   Physical Exam:  BP 134/74   Pulse 60   Temp 97.6 F (36.4 C)   Ht '5\' 7"'$  (1.702 m)   Wt 176 lb 12.8 oz (80.2 kg)   SpO2 97%   BMI 27.69 kg/m   General Appearance: Well nourished, in no apparent distress. Eyes: PERRLA, EOMs, conjunctiva no swelling or erythema Sinuses: No Frontal/maxillary tenderness ENT/Mouth: Ext aud canals clear, L TM bulging and dusky. No erythema, swelling, or exudate on post pharynx.   Hearing normal. Hoarse voice from smoking Neck: Supple, thyroid normal.  Respiratory: Respiratory effort normal, BS equal bilaterally without rales, rhonchi, wheezing or stridor.  Cardio: RRR with no MRGs. Brisk peripheral pulses without edema.  Abdomen: Soft, + BS.  Non tender, no guarding, rebound, hernias, masses. Lymphatics: Non tender without lymphadenopathy.  Musculoskeletal: Full ROM, 5/5 strength, normal gait.  Skin: Warm, dry without rashes, lesions, ecchymosis.  Neuro: Cranial nerves intact. Normal muscle tone, no cerebellar symptoms. Sensation intact.  Psych: Awake and oriented X 3, normal affect, Insight and Judgment appropriate.     Alycia Rossetti, NP 4:51 PM [facility]

## 2023-01-26 ENCOUNTER — Other Ambulatory Visit: Payer: Self-pay | Admitting: Nurse Practitioner

## 2023-01-27 ENCOUNTER — Encounter: Payer: Self-pay | Admitting: Nurse Practitioner

## 2023-01-27 NOTE — Telephone Encounter (Signed)
Will you tell him the referral process

## 2023-02-01 ENCOUNTER — Encounter: Payer: Self-pay | Admitting: Internal Medicine

## 2023-02-01 NOTE — Progress Notes (Unsigned)
Annual  Screening/Preventative Visit  & Comprehensive Evaluation & Examination  Future Appointments  Date Time Provider Department  02/02/2023                          cpe 11:00 AM [name], MD [facility]  02/03/2023  1:30 PM Leta Baptist, Earlean Polka, MD [facility]  05/11/2023                        wellness                         9:30 AM Darrol Jump, NP [facility]  06/15/2023 10:30 AM Ward Givens, NP [facility]  02/07/2024                                  cpe 11:00 AM [name], MD [facility]              This very nice 72 y.o. MWM presents for a Screening /Preventative Visit & comprehensive evaluation and management of multiple medical co-morbidities.  Patient has been followed for HTN, HLD, Prediabetes and Vitamin D Deficiency.   Patient has hx of Gout controlled on meds. 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 Patient reports improved sleep hygiene.       HTN predates since the  1980's. Patient's BP has been controlled at home.  Today's BP is                       .  Patient has CKD3a  (GFR 58) . Chest CT scan on 16 Nov 2019 showed Coronary Artery & Aortic Atherosclerosis .  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 Patient denies any cardiac symptoms as chest pain, palpitations, shortness of breath, dizziness or ankle swelling.       Patient's hyperlipidemia is controlled with diet and Rosuvastatin.  Patient denies myalgias or other medication SE's. Last lipids were at goal with sl elevated Trig's :  Lab Results  Component Value Date   CHOL 119 10/14/2022   HDL 36 (L) 10/14/2022   LDLCALC 58 10/14/2022   TRIG 170 (H) 10/14/2022   CHOLHDL 3.3 10/14/2022         Patient has moderate obesity  (BMI 32+) and is followed expectantly for glucose intolerance (A1c 5.7% /2011)  and patient denies reactive hypoglycemic symptoms, visual blurring, diabetic polys or paresthesias. Last A1c was at goal :   Lab Results  Component Value Date   HGBA1C 5.2  06/30/2022         Finally, patient has history of Vitamin D Deficiency ("12" /2009) and last vitamin D was at goal :  Lab Results  Component Value Date   VD25OH 95 06/30/2022       Current Outpatient Medications on File Prior to Visit  Medication Sig   acetaminophen (TYLENOL) 650 MG CR tablet Take 650 mg  every 8   hours as needed    bisoprolol (ZEBETA) 10 MG tablet TAKE 1 TABLET DAILY    CVITAMIN D 5000 u Take 10,000 Units  every morning.   clonazePAM (KLONOPIN) 1 MG tablet Take 0.5-1 tablets  at bedtime as needed    gabapentin (NEURONTIN) 600 MG tablet TAKE 1 TABLET 2 TIMES DAILY    Krill Oil 350 MG CAPS Take every morning.  Magnesium 400 MG CAPS Take 800 mg every morning.   meloxicam (MOBIC) 15 MG tablet TAKE 1 TABLET DAILY    CPAP Use  at bedtime.    rosuvastatin (CRESTOR) 40 MG tablet TAKE 1 TABLET  EVERY DAY    telmisartan (MICARDIS) 80 MG tablet TAKE 1 TABLET DAILY     Allergies  Allergen Reactions   Allopurinol Rash     Past Medical History:  Diagnosis Date   AKI (acute kidney injury) ([place]) 03/01/2021   Colon polyps    DJD (degenerative joint disease)    ED (erectile dysfunction)    Gout    Hyperlipemia    Hypertension    Obstructive sleep apnea    08/2012, cpap setting of 14   Prostate cancer ([place]) 01/2002   s/p prostatectomy   Vitamin D deficiency      Health Maintenance  Topic Date Due   Zoster Vaccines- Shingrix (1 of 2) Never done   COVID-19 Vaccine (4 - Booster for Pfizer series) 11/22/2020   COLONOSCOPY  09/06/2026   TETANUS/TDAP  03/02/2031   Pneumonia Vaccine 3+ Years old  Completed   INFLUENZA VACCINE  Completed   Hepatitis C Screening  Completed   HPV VACCINES  Aged Out   Fecal DNA (Cologuard)  Discontinued     Immunization History  Administered Date(s) Administered   Fluad Quad(high Dose) 10/03/2020   Influenza Split 08/26/2014   Influenza, High Dose  08/12/2016, 10/17/2018, 09/05/2019, 09/30/2021   Influenza, Seasonal  10/27/2015   PFIZER SARS-COV-2 Vacc  01/05/2020, 01/26/2020, 09/27/2020   PPD Test 05/23/2014, 07/23/2015   Pneumococcal -13 08/26/2014   Pneumococcal -23 05/05/2010, 01/30/2016   Td 11/30/2003   Tdap 08/12/2010, 08/21/2020, 03/01/2021   Zoster, Live 04/29/2013    Last Colon - 09/06/2016 - Dr Mellody Memos - recc 10 yr f/u  - due Oct 2027.   Past Surgical History:  Procedure Laterality Date   CATARACT EXTRACTION, BILATERAL Bilateral 12/2020   Dr. Tommy Rainwater   COLONOSCOPY WITH PROPOFOL N/A 09/06/2016   Procedure: COLONOSCOPY WITH PROPOFOL; Garlan Fair, MD   colonscopy     x 3 with polyp one time   PROSTATECTOMY  2003   SKIN CANCER EXCISION       Family History  Problem Relation Age of Onset   Hypertension Mother    Pulmonary fibrosis Mother    Stroke Father    Heart attack Maternal Grandmother    Stroke Maternal Grandfather    Stroke Paternal Grandmother    Colon cancer Paternal Grandfather      Social History   Tobacco Use   Smoking status: Former    Packs/day: 1.00    Years: 45.00    Pack years: 45.00    Types: Cigarettes    Quit date: 03/01/2021    Years since quitting: 0.8   Smokeless tobacco: Never  Vaping Use   Vaping Use: Never used  Substance Use Topics   Alcohol use: Yes    Alcohol/week: 15.0 standard drinks    Types: 15 Standard drinks or equivalent per week    Comment: 03/31/20  2-3 mixed drinks per day   Drug use: No      ROS Constitutional: Denies fever, chills, weight loss/gain, headaches, insomnia,  night sweats or change in appetite. Does c/o fatigue. Eyes: Denies redness, blurred vision, diplopia, discharge, itchy or watery eyes.  ENT: Denies discharge, congestion, post nasal drip, epistaxis, sore throat, earache, hearing loss, dental pain, Tinnitus, Vertigo, Sinus pain or snoring.  Cardio:  Denies chest pain, palpitations, irregular heartbeat, syncope, dyspnea, diaphoresis, orthopnea, PND, claudication or edema Respiratory: denies cough,  dyspnea, DOE, pleurisy, hoarseness, laryngitis or wheezing.  Gastrointestinal: Denies dysphagia, heartburn, reflux, water brash, pain, cramps, nausea, vomiting, bloating, diarrhea, constipation, hematemesis, melena, hematochezia, jaundice or hemorrhoids Genitourinary: Denies dysuria, frequency, discharge, hematuria or flank pain. Has urgency, nocturia x 2-3 & occasional hesitancy. Musculoskeletal: Denies arthralgia, myalgia, stiffness, Jt. Swelling, pain, limp or strain/sprain. Denies Falls. Skin: Denies puritis, rash, hives, warts, acne, eczema or change in skin lesion Neuro: No weakness, tremor, incoordination, spasms, paresthesia or pain Psychiatric: Denies confusion, memory loss or sensory loss. Denies Depression. Endocrine: Denies change in weight, skin, hair change, nocturia, and paresthesia, diabetic polys, visual blurring or hyper / hypo glycemic episodes.  Heme/Lymph: No excessive bleeding, bruising or enlarged lymph nodes.   Physical Exam  There were no vitals taken for this visit.  General Appearance: over nourished and well groomed and in no apparent distress.  Eyes: PERRLA, EOMs, conjunctiva no swelling or erythema, normal fundi and vessels. Sinuses: No frontal/maxillary tenderness ENT/Mouth: EACs patent / TMs  nl. Nares clear without erythema, swelling, mucoid exudates. Oral hygiene is good. No erythema, swelling, or exudate. Tongue normal, non-obstructing. Tonsils not swollen or erythematous. Hearing normal.  Neck: Supple, thyroid not palpable. No bruits, nodes or JVD. Respiratory: Respiratory effort normal.  BS equal and clear bilateral without rales, rhonci, wheezing or stridor. Cardio: Heart sounds are normal with regular rate and rhythm and no murmurs, rubs or gallops. Peripheral pulses are normal and equal bilaterally without edema. No aortic or femoral bruits. Chest: symmetric with normal excursions and percussion.  Abdomen: Soft, with Nl bowel sounds. Nontender, no  guarding, rebound, hernias, masses, or organomegaly.  Lymphatics: Non tender without lymphadenopathy.  Musculoskeletal: Full ROM all peripheral extremities, joint stability, 5/5 strength, and normal gait. Skin: Warm and dry without rashes, lesions, cyanosis, clubbing or  ecchymosis.  Neuro: Cranial nerves intact, reflexes equal bilaterally. Normal muscle tone, no cerebellar symptoms. Sensation intact.  Pysch: Alert and oriented X 3 with normal affect, insight and judgment appropriate.   Assessment and Plan  1. Annual Preventative/Screening Exam    2. Essential hypertension  - EKG 12-Lead - [place], RETROPERITNL ABD,  LTD - Urinalysis, Routine w reflex microscopic - Microalbumin / creatinine urine ratio - CBC with Differential/Platelet - COMPLETE METABOLIC PANEL WITH GFR - Magnesium - TSH  3. Hyperlipidemia, mixed  - EKG 12-Lead - [place], RETROPERITNL ABD,  LTD - Lipid panel - TSH  4. Abnormal glucose  - EKG 12-Lead - [place], RETROPERITNL ABD,  LTD - Hemoglobin A1c - Insulin, random  5. Vitamin D deficiency  - VITAMIN D 25 Hydroxy  6. Aortic atherosclerosis (HCC)  - EKG 12-Lead - [place], RETROPERITNL ABD,  LTD - Lipid panel  7. Morbid obesity ([facility]CC)  - TSH  8. Stage 3a chronic kidney disease (HCC)  - Urinalysis, Routine w reflex microscopic - Microalbumin / creatinine urine ratio - COMPLETE METABOLIC PANEL WITH GFR  9. Idiopathic gout  - Uric acid  10. BPH with obstruction/lower urinary tract symptoms  - PSA  11. Screening for colorectal cancer  - POC Hemoccult Bld/Stl  12. Prostate cancer screening  - PSA  13. Screening for heart disease  - EKG 12-Lead  14. FHx: heart disease  - EKG 12-Lead - [place], RETROPERITNL ABD,  LTD  15. Former smoker (45+ pack year history, quit 02/2021) - EKG 12-Lead - [place], RETROPERITNL ABD,  LTD  16. Screening for AAA (aortic  abdominal aneurysm)  - [place], RETROPERITNL ABD,  LTD  17. Medication management  - Urinalysis,  Routine w reflex microscopic - Microalbumin / creatinine urine ratio - CBC with Differential/Platelet - COMPLETE METABOLIC PANEL WITH GFR - Magnesium - Lipid panel - TSH - Hemoglobin A1c - Insulin, random - VITAMIN D 25 Hydroxy  - Uric acid           Patient was counseled in prudent diet, weight control to achieve/maintain BMI less than 25, BP monitoring, regular exercise and medications as discussed.  Discussed med effects and SE's. Routine screening labs and tests as requested with regular follow-up as recommended. Over 40 minutes of exam, counseling, chart review and high complex critical decision making was performed   Kirtland Bouchard, MD

## 2023-02-01 NOTE — Patient Instructions (Signed)

## 2023-02-02 ENCOUNTER — Ambulatory Visit (INDEPENDENT_AMBULATORY_CARE_PROVIDER_SITE_OTHER): Payer: Medicare Other | Admitting: Internal Medicine

## 2023-02-02 ENCOUNTER — Encounter: Payer: Self-pay | Admitting: Internal Medicine

## 2023-02-02 VITALS — BP 130/80 | HR 53 | Temp 97.6°F | Resp 16 | Ht 67.0 in | Wt 174.2 lb

## 2023-02-02 DIAGNOSIS — Z79899 Other long term (current) drug therapy: Secondary | ICD-10-CM | POA: Diagnosis not present

## 2023-02-02 DIAGNOSIS — Z136 Encounter for screening for cardiovascular disorders: Secondary | ICD-10-CM

## 2023-02-02 DIAGNOSIS — R7309 Other abnormal glucose: Secondary | ICD-10-CM

## 2023-02-02 DIAGNOSIS — M1000 Idiopathic gout, unspecified site: Secondary | ICD-10-CM

## 2023-02-02 DIAGNOSIS — E782 Mixed hyperlipidemia: Secondary | ICD-10-CM

## 2023-02-02 DIAGNOSIS — I10 Essential (primary) hypertension: Secondary | ICD-10-CM | POA: Diagnosis not present

## 2023-02-02 DIAGNOSIS — Z0001 Encounter for general adult medical examination with abnormal findings: Secondary | ICD-10-CM

## 2023-02-02 DIAGNOSIS — I700 Atherosclerosis of aorta: Secondary | ICD-10-CM

## 2023-02-02 DIAGNOSIS — N1831 Chronic kidney disease, stage 3a: Secondary | ICD-10-CM

## 2023-02-02 DIAGNOSIS — Z8249 Family history of ischemic heart disease and other diseases of the circulatory system: Secondary | ICD-10-CM

## 2023-02-02 DIAGNOSIS — N183 Chronic kidney disease, stage 3 unspecified: Secondary | ICD-10-CM

## 2023-02-02 DIAGNOSIS — Z1211 Encounter for screening for malignant neoplasm of colon: Secondary | ICD-10-CM

## 2023-02-02 DIAGNOSIS — N138 Other obstructive and reflux uropathy: Secondary | ICD-10-CM

## 2023-02-02 DIAGNOSIS — E559 Vitamin D deficiency, unspecified: Secondary | ICD-10-CM

## 2023-02-02 DIAGNOSIS — Z87891 Personal history of nicotine dependence: Secondary | ICD-10-CM

## 2023-02-02 DIAGNOSIS — Z125 Encounter for screening for malignant neoplasm of prostate: Secondary | ICD-10-CM

## 2023-02-02 DIAGNOSIS — Z0000 Encounter for general adult medical examination without abnormal findings: Secondary | ICD-10-CM | POA: Diagnosis not present

## 2023-02-02 HISTORY — DX: Chronic kidney disease, stage 3 unspecified: N18.30

## 2023-02-02 MED ORDER — DEXAMETHASONE 4 MG PO TABS: ORAL_TABLET | ORAL | 0 refills | Status: DC

## 2023-02-03 ENCOUNTER — Other Ambulatory Visit: Payer: Self-pay | Admitting: Internal Medicine

## 2023-02-03 ENCOUNTER — Ambulatory Visit: Payer: Medicare Other | Admitting: Diagnostic Neuroimaging

## 2023-02-03 ENCOUNTER — Telehealth: Payer: Self-pay | Admitting: Diagnostic Neuroimaging

## 2023-02-03 ENCOUNTER — Encounter: Payer: Self-pay | Admitting: Diagnostic Neuroimaging

## 2023-02-03 VITALS — BP 111/67 | HR 66 | Ht 67.0 in | Wt 173.0 lb

## 2023-02-03 DIAGNOSIS — R1310 Dysphagia, unspecified: Secondary | ICD-10-CM | POA: Diagnosis not present

## 2023-02-03 DIAGNOSIS — R519 Headache, unspecified: Secondary | ICD-10-CM

## 2023-02-03 DIAGNOSIS — K148 Other diseases of tongue: Secondary | ICD-10-CM | POA: Diagnosis not present

## 2023-02-03 LAB — MICROALBUMIN / CREATININE URINE RATIO
Creatinine, Urine: 130 mg/dL (ref 20–320)
Microalb Creat Ratio: 317 mcg/mg creat — ABNORMAL HIGH (ref <30)
Microalb, Ur: 41.2 mg/dL

## 2023-02-03 LAB — VITAMIN D 25 HYDROXY (VIT D DEFICIENCY, FRACTURES): Vit D, 25-Hydroxy: 110 ng/mL — ABNORMAL HIGH (ref 30–100)

## 2023-02-03 LAB — URINALYSIS, ROUTINE W REFLEX MICROSCOPIC
Bilirubin Urine: NEGATIVE
Glucose, UA: NEGATIVE
Hgb urine dipstick: NEGATIVE
Hyaline Cast: NONE SEEN /LPF
Ketones, ur: NEGATIVE
Leukocytes,Ua: NEGATIVE
Nitrite: NEGATIVE
RBC / HPF: NONE SEEN /HPF (ref 0–2)
Specific Gravity, Urine: 1.02 (ref 1.001–1.035)
pH: 6.5 (ref 5.0–8.0)

## 2023-02-03 LAB — COMPLETE METABOLIC PANEL WITH GFR
AG Ratio: 1.5 (calc) (ref 1.0–2.5)
ALT: 23 U/L (ref 9–46)
AST: 23 U/L (ref 10–35)
Albumin: 4.2 g/dL (ref 3.6–5.1)
Alkaline phosphatase (APISO): 54 U/L (ref 35–144)
BUN/Creatinine Ratio: 18 (calc) (ref 6–22)
BUN: 27 mg/dL — ABNORMAL HIGH (ref 7–25)
CO2: 25 mmol/L (ref 20–32)
Calcium: 10 mg/dL (ref 8.6–10.3)
Chloride: 105 mmol/L (ref 98–110)
Creat: 1.53 mg/dL — ABNORMAL HIGH (ref 0.70–1.28)
Globulin: 2.8 g/dL (calc) (ref 1.9–3.7)
Glucose, Bld: 90 mg/dL (ref 65–99)
Potassium: 4.8 mmol/L (ref 3.5–5.3)
Sodium: 138 mmol/L (ref 135–146)
Total Bilirubin: 0.8 mg/dL (ref 0.2–1.2)
Total Protein: 7 g/dL (ref 6.1–8.1)
eGFR: 48 mL/min/{1.73_m2} — ABNORMAL LOW (ref >=60)

## 2023-02-03 LAB — CBC WITH DIFFERENTIAL/PLATELET
Absolute Monocytes: 708 cells/uL (ref 200–950)
Basophils Absolute: 54 cells/uL (ref 0–200)
Basophils Relative: 0.7 %
Eosinophils Absolute: 154 cells/uL (ref 15–500)
Eosinophils Relative: 2 %
HCT: 44.4 % (ref 38.5–50.0)
Hemoglobin: 15.2 g/dL (ref 13.2–17.1)
Lymphs Abs: 2372 cells/uL (ref 850–3900)
MCH: 32.3 pg (ref 27.0–33.0)
MCHC: 34.2 g/dL (ref 32.0–36.0)
MCV: 94.3 fL (ref 80.0–100.0)
MPV: 11.1 fL (ref 7.5–12.5)
Monocytes Relative: 9.2 %
Neutro Abs: 4412 cells/uL (ref 1500–7800)
Neutrophils Relative %: 57.3 %
Platelets: 172 10*3/uL (ref 140–400)
RBC: 4.71 10*6/uL (ref 4.20–5.80)
RDW: 13 % (ref 11.0–15.0)
Total Lymphocyte: 30.8 %
WBC: 7.7 10*3/uL (ref 3.8–10.8)

## 2023-02-03 LAB — HEMOGLOBIN A1C
Hgb A1c MFr Bld: 5.9 % of total Hgb — ABNORMAL HIGH (ref <5.7)
Mean Plasma Glucose: 123 mg/dL
eAG (mmol/L): 6.8 mmol/L

## 2023-02-03 LAB — LIPID PANEL
Cholesterol: 103 mg/dL (ref <200)
HDL: 30 mg/dL — ABNORMAL LOW (ref >=40)
LDL Cholesterol (Calc): 50 mg/dL (calc)
Non-HDL Cholesterol (Calc): 73 mg/dL (calc) (ref <130)
Total CHOL/HDL Ratio: 3.4 (calc) (ref <5.0)
Triglycerides: 142 mg/dL (ref <150)

## 2023-02-03 LAB — PSA: PSA: 0.29 ng/mL (ref <=4.00)

## 2023-02-03 LAB — MAGNESIUM: Magnesium: 2 mg/dL (ref 1.5–2.5)

## 2023-02-03 LAB — TSH: TSH: 0.44 mIU/L (ref 0.40–4.50)

## 2023-02-03 LAB — INSULIN, RANDOM: Insulin: 8.4 u[IU]/mL

## 2023-02-03 LAB — URIC ACID: Uric Acid, Serum: 5.5 mg/dL (ref 4.0–8.0)

## 2023-02-03 LAB — MICROSCOPIC MESSAGE

## 2023-02-03 NOTE — Telephone Encounter (Signed)
[employer] medicare NPR sent to GI [phone]

## 2023-02-03 NOTE — Patient Instructions (Signed)
-   check MRI brain  - check CT soft tissue neck   - follow up ENT evaluation  - compete dexamethasone dose per PCP

## 2023-02-03 NOTE — Progress Notes (Signed)
[facility]  PATIENT: Stephen Holland DOB: 02/22/1951  REFERRING CLINICIAN: Darrol Jump, NP  HISTORY FROM: patient  REASON FOR VISIT: new consult    HISTORICAL  CHIEF COMPLAINT:  Chief Complaint  Patient presents with   Follow-up    Patient in room #7 and alone. Pt here today to discuss his headaches and has been choking lately.    HISTORY OF PRESENT ILLNESS:   UPDATE (02/03/23, VRP): Since last visit, was stable until Oct 2023. Then started having left submandibular pain, tenderness, left temporal headaches. This has progressed. Now pain is constant, but aggravated with eating and swallowing, and now triggers bilateral pain.   UPDATE (03/31/20, VRP): Since last visit, doing patient had MRI of the cervical and lumbar spine, followed up with spine surgery clinic.  He was empirically started on gabapentin titrated up to 600 mg 3 times a day.  Patient does not have any significant pain and gabapentin is not affected his symptoms.  He does not feel sleepy on gabapentin.  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  Patient returns to see if neuropathy affects his ability to drive.  Patient does report numbness in his hands and feet but he is able to feel the pedals and maintain good control of his vehicle.  No significant pain.  Patient drinks 2-3 alcohol drinks per night.  He smokes 1.5 packs of cigarettes per day.  He is following up with PCP and cardiology regarding dyspnea and lower extremity edema.  PRIOR HPI 12/25/18:  72 year old male here for evaluation of numbness and tingling.  Patient reports at least 8 to 10 years of numbness and tingling in his hands and feet (starting approximately 2010).  He also has neck pain, low back pain, possible right carpal tunnel syndrome (diagnosed clinically, but never had EMG testing).  He has been to orthopedic clinic in the past, had epidural  steroid injection in the low back a few years ago, without relief.  He has been to neurosurgery in the past for his cervical and lumbar degenerative spine disease, and was offered surgery but no guarantee of improvement, and therefore he declined to proceed.  Since that time symptoms have continued to progress.   REVIEW OF SYSTEMS: Full 14 system review of systems performed and negative with exception of: As per HPI.  ALLERGIES: Allergies  Allergen Reactions   Allopurinol Rash    HOME MEDICATIONS: Outpatient Medications Prior to Visit  Medication Sig Dispense Refill   acetaminophen (TYLENOL) 650 MG CR tablet Take 650 mg by mouth every 8 (eight) hours as needed for pain.     bisoprolol (ZEBETA) 10 MG tablet TAKE 1 TABLET BY MOUTH EVERY DAY FOR BLOOD PRESSURE 90 tablet 1   Cholecalciferol (VITAMIN D3) 125 MCG (5000 UT) CAPS Take 10,000 Units by mouth every morning.     clonazePAM (KLONOPIN) 1 MG tablet TAKE 1/2 TO 1 TABLETS BY MOUTH AT BEDTIME AS NEEDED (RESTLESS LEG SYNDROME). 90 tablet 0   dexamethasone (DECADRON) 4 MG tablet Take 1 tab 3 x day for 5  days, then 2 x day for 5 days, then 1 tab daily 30 tablet 0   gabapentin (NEURONTIN) 600 MG tablet TAKE 1/2 TO 1 TABLET 2 TO 3 TIMES DAILY AS NEEDED FOR NEUROPATHY PAIN 270 tablet 3   hyoscyamine (LEVSIN) 0.125 MG tablet Take 1 tablet (0.125 mg total) by mouth every 4 (four) hours as needed for cramping (  diarrhea, nausea). 50 tablet 2   Krill Oil 350 MG CAPS Take 350 mg by mouth every morning.     Magnesium 400 MG CAPS Take 800 mg by mouth every morning.     meloxicam (MOBIC) 15 MG tablet TAKE 1 TABLET BY MOUTH DAILY AS NEEDED WITH FOOD FOR PAIN & INFLAMMATION 90 tablet 3   pantoprazole (PROTONIX) 40 MG tablet TAKE 1 TABLET BY MOUTH EVERY DAY 90 tablet 1   rosuvastatin (CRESTOR) 40 MG tablet TAKE 1 TABLET BY MOUTH EVERY DAY FOR CHOLESTEROL 90 tablet 3   telmisartan (MICARDIS) 80 MG tablet TAKE 1 TABLET BY MOUTH EVERY DAY FOR BLOOD PRESSURE  90 tablet 3   TURMERIC PO Take 2,600 mg by mouth in the morning and at bedtime.     amoxicillin (AMOXIL) 500 MG capsule 2 capsules Orally every 12 hrs for 14 days (Patient not taking: Reported on 02/03/2023)     clarithromycin (BIAXIN) 500 MG tablet 1 tablet Orally every 12 hrs for 14 days (Patient not taking: Reported on 02/03/2023)     No facility-administered medications prior to visit.    PAST MEDICAL HISTORY: Past Medical History:  Diagnosis Date   AKI (acute kidney injury) ([place]) 03/01/2021   Colon polyps    DJD (degenerative joint disease)    ED (erectile dysfunction)    Gout    Hyperlipemia    Hypertension    Obstructive sleep apnea    08/2012, cpap setting of 14   Prostate cancer ([place] [facility]) 01/2002   s/p prostatectomy   Vitamin D deficiency     PAST SURGICAL HISTORY: Past Surgical History:  Procedure Laterality Date   CATARACT EXTRACTION, BILATERAL Bilateral 12/2020   Dr. Tommy Rainwater   COLONOSCOPY WITH PROPOFOL N/A 09/06/2016   Procedure: COLONOSCOPY WITH PROPOFOL;  Surgeon: Garlan Fair, MD;  Location: [facility];  Service: Endoscopy;  Laterality: N/A;   colonscopy     x 3 with polyp one time   PROSTATECTOMY  2003   SKIN CANCER EXCISION      FAMILY HISTORY: Family History  Problem Relation Age of Onset   Hypertension Mother    Pulmonary fibrosis Mother    Stroke Father    Heart attack Maternal Grandmother    Stroke Maternal Grandfather    Stroke Paternal Grandmother    Colon cancer Paternal Grandfather     SOCIAL HISTORY: Social History   Socioeconomic History   Marital status: Married    Spouse name: [name]   Number of children: 2   Years of education: 12   Highest education level: Not on file  Occupational History    Comment: truck driver  Tobacco Use   Smoking status: Every Day    Packs/day: 1.00    Types: Cigarettes   Smokeless tobacco: Never  Vaping Use   Vaping Use: Never used  Substance and Sexual Activity   Alcohol use: Yes    Alcohol/week:  15.0 standard drinks of alcohol    Types: 15 Standard drinks or equivalent per week    Comment: 03/31/20  2-3 mixed drinks per day   Drug use: No   Sexual activity: Not on file  Other Topics Concern   Not on file  Social History Narrative   Lives home with wife. Retired.  Education HS.  Children 2     Caffeine 6 cups coffee daily, 2-3 glasses of tea and one coke per day   Social Determinants of Health   Financial Resource Strain: Not on [employer]  Insecurity: No Food Insecurity (02/04/2022)   Hunger Vital Sign    Worried About Running Out of Food in the Last Year: Never true    Ran Out of Food in the Last Year: Never true  Transportation Needs: No Transportation Needs (11/10/2021)   PRAPARE - Hydrologist (Medical): No    Lack of Transportation (Non-Medical): No  Physical Activity: Not on file  Stress: Not on file  Social Connections: Not on file  Intimate Partner Violence: Not on file     PHYSICAL EXAM  GENERAL EXAM/CONSTITUTIONAL: Vitals:  Vitals:   02/03/23 1249  BP: 111/67  Pulse: 66  Weight: 173 lb (78.5 kg)  Height: '5\' 7"'$  (1.702 m)   Body mass index is 27.1 kg/m. Wt Readings from Last 3 Encounters:  02/03/23 173 lb (78.5 kg)  02/02/23 174 lb 3.2 oz (79 kg)  01/24/23 176 lb 12.8 oz (80.2 kg)   Patient is in no distress; well developed, nourished and groomed; neck is supple  CARDIOVASCULAR: Examination of carotid arteries is normal; no carotid bruits Regular rate and rhythm, no murmurs Examination of peripheral vascular system by observation and palpation is normal  EYES: Ophthalmoscopic exam of optic discs and posterior segments is normal; no papilledema or hemorrhages No results found.  MUSCULOSKELETAL: Gait, strength, tone, movements noted in Neurologic exam below  NEUROLOGIC: MENTAL STATUS:      No data to display         awake, alert, oriented to person, place and time recent and remote memory intact normal  attention and concentration language fluent, comprehension intact, naming intact fund of knowledge appropriate  CRANIAL NERVE:  2nd - no papilledema on fundoscopic exam 2nd, 3rd, 4th, 6th - pupils equal and reactive to light, visual fields full to confrontation, extraocular muscles intact, no nystagmus 5th - facial sensation symmetric 7th - facial strength symmetric 8th - hearing intact 9th - palate elevates symmetrically, uvula midline 11th - shoulder shrug symmetric 12th - tongue protrusion midline  MOTOR:  normal bulk and tone, full strength in the BUE, BLE EXCEPT RIGHT HAND APB WEAKNESS (SUBTLE ATROPHY)  SENSORY:  normal and symmetric to light touch, temperature, vibration; DECR PP IN FINGERS AND FEET  COORDINATION:  finger-nose-finger, fine finger movements normal  REFLEXES:  deep tendon reflexes present and symmetric; BUE 2; KNEES TRACE; ANKLES 0  GAIT/STATION:  narrow based gait     DIAGNOSTIC DATA (LABS, IMAGING, TESTING) - I reviewed patient records, labs, notes, testing and imaging myself where available.  Lab Results  Component Value Date   WBC 7.7 02/02/2023   HGB 15.2 02/02/2023   HCT 44.4 02/02/2023   MCV 94.3 02/02/2023   PLT 172 02/02/2023      Component Value Date/Time   NA 138 02/02/2023 1101   NA 139 05/08/2020 0922   K 4.8 02/02/2023 1101   CL 105 02/02/2023 1101   CO2 25 02/02/2023 1101   GLUCOSE 90 02/02/2023 1101   BUN 27 (H) 02/02/2023 1101   BUN 29 (H) 05/08/2020 0922   CREATININE 1.53 (H) 02/02/2023 1101   CALCIUM 10.0 02/02/2023 1101   PROT 7.0 02/02/2023 1101   ALBUMIN 4.1 06/13/2017 1004   AST 23 02/02/2023 1101   ALT 23 02/02/2023 1101   ALKPHOS 58 06/13/2017 1004   BILITOT 0.8 02/02/2023 1101   GFRNONAA 44 (L) 03/25/2021 1123   GFRAA 51 (L) 03/25/2021 1123   Lab Results  Component Value Date   CHOL 103 02/02/2023  HDL 30 (L) 02/02/2023   LDLCALC 50 02/02/2023   TRIG 142 02/02/2023   CHOLHDL 3.4 02/02/2023   Lab  Results  Component Value Date   HGBA1C 5.9 (H) 02/02/2023   Lab Results  Component Value Date   [id number] 08/21/2020   Lab Results  Component Value Date   TSH 0.44 02/02/2023    12/08/22 CT head - Unremarkable CT appearance of the brain for age.    Lab Results  Component Value Date   ESRSEDRATE 2 10/14/2022   Lab Results  Component Value Date   CRP 3.0 10/14/2022     ASSESSMENT AND PLAN   72 y.o. year old male here with:  Dx:  1. Left temporal headache   2. Dysphagia, unspecified type     PLAN:  NEW ONSET HEADACHES, DYSPHAGIA, LEFT SUBMANDIBULAR PAIN (since Oct 2023) - check MRI brain (rule out stroke, mass) - check CT soft tissue neck (rule out mass, infection) - follow up ENT evaluation - compete dexamethasone dose per PCP  Orders Placed This Encounter  Procedures   MR BRAIN [facility] [place]   Return for pending test results, pending if symptoms worsen or fail to improve.  I reviewed images, labs, notes, records myself. I summarized findings and reviewed with patient, for this high risk condition (severe headache, dysphagia) requiring high complexity decision making.    Penni Bombard, MD [id number], [id number] PM Certified in Neurology, Neurophysiology and Neuroimaging  [facility] [address] [place], [place] [address] [phone]

## 2023-02-03 NOTE — Progress Notes (Signed)
<><><><><><><><><><><><><><><><><><><><><><><><><><><><><><><><><> <><><><><><><><><><><><><><><><><><><><><><><><><><><><><><><><><> -   Test results slightly outside the reference range are not unusual. If there is anything important, I will review this with you,  otherwise it is considered normal test values.  If you have further questions,  please do not hesitate to contact me at the office or via My Chart.  <><><><><><><><><><><><><><><><><><><><><><><><><><><><><><><><><> <><><><><><><><><><><><><><><><><><><><><><><><><><><><><><><><><>  - Kidney functions stable  - Stage 3a  (GFR 48)  -                                                            &   stays about the same - Great  <><><><><><><><><><><><><><><><><><><><><><><><><><><><><><><><><>  -  Chol = 103   and LDL Chol = 50 - Wonderful  -   Excellent   - Very low risk for Heart Attack  / Stroke <><><><><><><><><><><><><><><><><><><><><><><><><><><><><><><><><>  -  A1c= 5.9%  Blood sugar and A1c are again elevated in the borderline and                                                                 early or pre-diabetes range which has the same   300% increased risk for heart attack, stroke, cancer and                                                 alzheimer- type vascular dementia as full blown diabetes.   But the good news is that diet, exercise with                                                           weight loss can cure the early diabetes at this point.  <><><><><><><><><><><><><><><><><><><><><><><><><><><><><><><><><>  -  Vitamin D = 110, borderline elevated   - Currently on Vit D  10,000 unoits /day   - Suggest cut back to 5,000 units 3 x/week on Mon Wed Fri   & keep 10,000 units the other 4 days on Tues  Thurs  Sat  Sun   <><><><><><><><><><><><><><><><><><><><><><><><><><><><><><><><><> <><><><><><><><><><><><><><><><><><><><><><><><><><><><><><><><><>  -  PSA - Very Low - No Prostate Cancer - Great  ! <><><><><><><><><><><><><><><><><><><><><><><><><><><><><><><><><>  -  All Else - CBC - Electrolytes - Liver - Magnesium & Thyroid    - all  Normal / OK <><><><><><><><><><><><><><><><><><><><><><><><><><><><><><><><><> <><><><><><><><><><><><><><><><><><><><><><><><><><><><><><><><><>

## 2023-02-04 ENCOUNTER — Other Ambulatory Visit: Payer: Self-pay | Admitting: Nurse Practitioner

## 2023-02-04 ENCOUNTER — Other Ambulatory Visit: Payer: Self-pay | Admitting: Orthopaedic Surgery

## 2023-02-07 ENCOUNTER — Other Ambulatory Visit: Payer: Self-pay | Admitting: Gastroenterology

## 2023-02-07 DIAGNOSIS — K7460 Unspecified cirrhosis of liver: Secondary | ICD-10-CM

## 2023-02-07 DIAGNOSIS — K769 Liver disease, unspecified: Secondary | ICD-10-CM

## 2023-02-17 ENCOUNTER — Encounter: Payer: Self-pay | Admitting: Internal Medicine

## 2023-02-21 DIAGNOSIS — G4733 Obstructive sleep apnea (adult) (pediatric): Secondary | ICD-10-CM | POA: Diagnosis not present

## 2023-03-01 ENCOUNTER — Ambulatory Visit
Admission: RE | Admit: 2023-03-01 | Discharge: 2023-03-01 | Disposition: A | Discharge status: Home / Self Care | Payer: Medicare Other | Source: Ambulatory Visit | Attending: Diagnostic Neuroimaging | Admitting: Diagnostic Neuroimaging

## 2023-03-01 DIAGNOSIS — R519 Headache, unspecified: Secondary | ICD-10-CM

## 2023-03-01 DIAGNOSIS — R6884 Jaw pain: Secondary | ICD-10-CM | POA: Diagnosis not present

## 2023-03-01 DIAGNOSIS — R1310 Dysphagia, unspecified: Secondary | ICD-10-CM

## 2023-03-01 DIAGNOSIS — I6523 Occlusion and stenosis of bilateral carotid arteries: Secondary | ICD-10-CM | POA: Diagnosis not present

## 2023-03-01 DIAGNOSIS — M47812 Spondylosis without myelopathy or radiculopathy, cervical region: Secondary | ICD-10-CM | POA: Diagnosis not present

## 2023-03-01 MED ORDER — GADOPICLENOL 0.5 MMOL/ML IV SOLN
7.5000 mL | Freq: Once | INTRAVENOUS | Status: AC | PRN
  Administered 2023-03-01: 7.5 mL via INTRAVENOUS

## 2023-03-01 MED ORDER — IOPAMIDOL (ISOVUE-300) INJECTION 61%
75.0000 mL | Freq: Once | INTRAVENOUS | Status: AC | PRN
  Administered 2023-03-01: 75 mL via INTRAVENOUS

## 2023-03-03 ENCOUNTER — Telehealth: Payer: Self-pay | Admitting: Diagnostic Neuroimaging

## 2023-03-03 ENCOUNTER — Encounter: Payer: Self-pay | Admitting: Neurology

## 2023-03-03 NOTE — Addendum Note (Signed)
Addended by: Andrey Spearman R on: 03/03/2023 04:21 PM   Modules accepted: Orders

## 2023-03-03 NOTE — Telephone Encounter (Signed)
Referral sent to [facility]([place]) [phone]

## 2023-03-04 ENCOUNTER — Encounter: Payer: Self-pay | Admitting: Diagnostic Neuroimaging

## 2023-03-05 ENCOUNTER — Ambulatory Visit
Admission: RE | Admit: 2023-03-05 | Discharge: 2023-03-05 | Disposition: A | Discharge status: Home / Self Care | Payer: Medicare Other | Source: Ambulatory Visit | Attending: Gastroenterology | Admitting: Gastroenterology

## 2023-03-05 DIAGNOSIS — K769 Liver disease, unspecified: Secondary | ICD-10-CM

## 2023-03-05 DIAGNOSIS — K7460 Unspecified cirrhosis of liver: Secondary | ICD-10-CM | POA: Diagnosis not present

## 2023-03-05 MED ORDER — GADOPICLENOL 0.5 MMOL/ML IV SOLN
8.0000 mL | Freq: Once | INTRAVENOUS | Status: AC | PRN
  Administered 2023-03-05: 8 mL via INTRAVENOUS

## 2023-03-07 ENCOUNTER — Telehealth (INDEPENDENT_AMBULATORY_CARE_PROVIDER_SITE_OTHER): Payer: Medicare Other | Admitting: Diagnostic Neuroimaging

## 2023-03-07 ENCOUNTER — Encounter: Payer: Self-pay | Admitting: Diagnostic Neuroimaging

## 2023-03-07 DIAGNOSIS — K148 Other diseases of tongue: Secondary | ICD-10-CM

## 2023-03-07 DIAGNOSIS — R519 Headache, unspecified: Secondary | ICD-10-CM

## 2023-03-07 MED ORDER — HYDROCODONE-ACETAMINOPHEN 5-325 MG PO TABS: 1.0000 | ORAL_TABLET | Freq: Three times a day (TID) | ORAL | 0 refills | Status: DC | PRN

## 2023-03-07 NOTE — Progress Notes (Signed)
[facility]  PATIENT: Stephen Holland DOB: 1951-11-26  REFERRING CLINICIAN: Lucky Cowboy, MD  HISTORY FROM: patient  REASON FOR VISIT: follow up   HISTORICAL  CHIEF COMPLAINT:  Chief Complaint  Patient presents with   Headache   throat pain    HISTORY OF PRESENT ILLNESS:   UPDATE (03/07/23, VRP): Since last visit, doing about the same. More pain, headache when trying to eat. 30lb weight loss. Had CT soft tissue neck showing possible tongue CA. Has ENT appt next Tuesday.   UPDATE (02/03/23, VRP): Since last visit, was stable until Oct 2023. Then started having left submandibular pain, tenderness, left temporal headaches. This has progressed. Now pain is constant, but aggravated with eating and swallowing, and now triggers bilateral pain.   UPDATE (03/31/20, VRP): Since last visit, doing patient had MRI of the cervical and lumbar spine, followed up with spine surgery clinic.  He was empirically started on gabapentin titrated up to 600 mg 3 times a day.  Patient does not have any significant pain and gabapentin is not affected his symptoms.  He does not feel sleepy on gabapentin.  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  Patient returns to see if neuropathy affects his ability to drive.  Patient does report numbness in his hands and feet but he is able to feel the pedals and maintain good control of his vehicle.  No significant pain.  Patient drinks 2-3 alcohol drinks per night.  He smokes 1.5 packs of cigarettes per day.  He is following up with PCP and cardiology regarding dyspnea and lower extremity edema.  PRIOR HPI 12/25/18:  72 year old male here for evaluation of numbness and tingling.  Patient reports at least 8 to 10 years of numbness and tingling in his hands and feet (starting approximately 2010).  He also has neck pain, low back pain, possible right carpal tunnel syndrome  (diagnosed clinically, but never had EMG testing).  He has been to orthopedic clinic in the past, had epidural steroid injection in the low back a few years ago, without relief.  He has been to neurosurgery in the past for his cervical and lumbar degenerative spine disease, and was offered surgery but no guarantee of improvement, and therefore he declined to proceed.  Since that time symptoms have continued to progress.   REVIEW OF SYSTEMS: Full 14 system review of systems performed and negative with exception of: As per HPI.  ALLERGIES: Allergies  Allergen Reactions   Allopurinol Rash    HOME MEDICATIONS: Outpatient Medications Prior to Visit  Medication Sig Dispense Refill   acetaminophen (TYLENOL) 650 MG CR tablet Take 650 mg by mouth every 8 (eight) hours as needed for pain.     bisoprolol (ZEBETA) 10 MG tablet TAKE 1 TABLET BY MOUTH EVERY DAY FOR BLOOD PRESSURE 90 tablet 1   Cholecalciferol (VITAMIN D3) 125 MCG (5000 UT) CAPS Take 10,000 Units by mouth every morning.     clonazePAM (KLONOPIN) 1 MG tablet TAKE 1/2 TO 1 TABLETS BY MOUTH AT BEDTIME AS NEEDED (RESTLESS LEG SYNDROME). 90 tablet 0   dexamethasone (DECADRON) 4 MG tablet Take 1 tab 3 x day for 5  days, then 2 x day for 5 days, then 1 tab daily 30 tablet 0   doxycycline (VIBRA-TABS) 100 MG tablet TAKE 1 TABLET BY MOUTH TWICE A DAY 30 tablet 0   gabapentin (NEURONTIN) 600 MG tablet TAKE 1/2 TO 1 TABLET  2 TO 3 TIMES DAILY AS NEEDED FOR NEUROPATHY PAIN 270 tablet 3   hyoscyamine (LEVSIN) 0.125 MG tablet Take 1 tablet (0.125 mg total) by mouth every 4 (four) hours as needed for cramping (diarrhea, nausea). 50 tablet 2   Krill Oil 350 MG CAPS Take 350 mg by mouth every morning.     Magnesium 400 MG CAPS Take 800 mg by mouth every morning.     meloxicam (MOBIC) 15 MG tablet TAKE 1 TABLET BY MOUTH DAILY AS NEEDED WITH FOOD FOR PAIN & INFLAMMATION 90 tablet 3   pantoprazole (PROTONIX) 40 MG tablet TAKE 1 TABLET BY MOUTH EVERY DAY 90  tablet 1   rosuvastatin (CRESTOR) 40 MG tablet TAKE 1 TABLET BY MOUTH EVERY DAY FOR CHOLESTEROL 90 tablet 3   telmisartan (MICARDIS) 80 MG tablet TAKE 1 TABLET BY MOUTH EVERY DAY FOR BLOOD PRESSURE 90 tablet 3   TURMERIC PO Take 2,600 mg by mouth in the morning and at bedtime.     No facility-administered medications prior to visit.    PAST MEDICAL HISTORY: Past Medical History:  Diagnosis Date   AKI (acute kidney injury) ([facility]) 03/01/2021   Colon polyps    DJD (degenerative joint disease)    ED (erectile dysfunction)    Gout    Hyperlipemia    Hypertension    Obstructive sleep apnea    08/2012, cpap setting of 14   Prostate cancer ([facility]) 01/2002   s/p prostatectomy   Vitamin D deficiency     PAST SURGICAL HISTORY: Past Surgical History:  Procedure Laterality Date   CATARACT EXTRACTION, BILATERAL Bilateral 12/2020   Dr. Darel Hong   COLONOSCOPY WITH PROPOFOL N/A 09/06/2016   Procedure: COLONOSCOPY WITH PROPOFOL;  Surgeon: Charolett Bumpers, MD;  Location: [facility];  Service: Endoscopy;  Laterality: N/A;   colonscopy     x 3 with polyp one time   PROSTATECTOMY  2003   SKIN CANCER EXCISION      FAMILY HISTORY: Family History  Problem Relation Age of Onset   Hypertension Mother    Pulmonary fibrosis Mother    Stroke Father    Heart attack Maternal Grandmother    Stroke Maternal Grandfather    Stroke Paternal Grandmother    Colon cancer Paternal Grandfather     SOCIAL HISTORY: Social History   Socioeconomic History   Marital status: Married    Spouse name: [name]   Number of children: 2   Years of education: 12   Highest education level: Not on file  Occupational History    Comment: truck driver  Tobacco Use   Smoking status: Every Day    Packs/day: 1    Types: Cigarettes   Smokeless tobacco: Never  Vaping Use   Vaping Use: Never used  Substance and Sexual Activity   Alcohol use: Yes    Alcohol/week: 15.0 standard drinks of alcohol    Types: 15 Standard  drinks or equivalent per week    Comment: 03/31/20  2-3 mixed drinks per day   Drug use: No   Sexual activity: Not on file  Other Topics Concern   Not on file  Social History Narrative   Lives home with wife. Retired.  Education HS.  Children 2     Caffeine 6 cups coffee daily, 2-3 glasses of tea and one coke per day   Social Determinants of Health   Financial Resource Strain: Not on file  Food Insecurity: No Food Insecurity (02/04/2022)   Hunger Vital Sign  Worried About Programme researcher, broadcasting/film/video in the Last Year: Never true    Ran Out of Food in the Last Year: Never true  Transportation Needs: No Transportation Needs (11/10/2021)   PRAPARE - Administrator, Civil Service (Medical): No    Lack of Transportation (Non-Medical): No  Physical Activity: Not on file  Stress: Not on file  Social Connections: Not on file  Intimate Partner Violence: Not on file     PHYSICAL EXAM Video visit   DIAGNOSTIC DATA (LABS, IMAGING, TESTING) - I reviewed patient records, labs, notes, testing and imaging myself where available.  Lab Results  Component Value Date   WBC 7.7 02/02/2023   HGB 15.2 02/02/2023   HCT 44.4 02/02/2023   MCV 94.3 02/02/2023   PLT 172 02/02/2023      Component Value Date/Time   NA 138 02/02/2023 1101   NA 139 05/08/2020 0922   K 4.8 02/02/2023 1101   CL 105 02/02/2023 1101   CO2 25 02/02/2023 1101   GLUCOSE 90 02/02/2023 1101   BUN 27 (H) 02/02/2023 1101   BUN 29 (H) 05/08/2020 0922   CREATININE 1.53 (H) 02/02/2023 1101   CALCIUM 10.0 02/02/2023 1101   PROT 7.0 02/02/2023 1101   ALBUMIN 4.1 06/13/2017 1004   AST 23 02/02/2023 1101   ALT 23 02/02/2023 1101   ALKPHOS 58 06/13/2017 1004   BILITOT 0.8 02/02/2023 1101   GFRNONAA 44 (L) 03/25/2021 1123   GFRAA 51 (L) 03/25/2021 1123   Lab Results  Component Value Date   CHOL 103 02/02/2023   HDL 30 (L) 02/02/2023   LDLCALC 50 02/02/2023   TRIG 142 02/02/2023   CHOLHDL 3.4 02/02/2023   Lab  Results  Component Value Date   HGBA1C 5.9 (H) 02/02/2023   Lab Results  Component Value Date   VITAMINB12 517 08/21/2020   Lab Results  Component Value Date   TSH 0.44 02/02/2023    12/08/22 CT head - Unremarkable CT appearance of the brain for age.    Lab Results  Component Value Date   ESRSEDRATE 2 10/14/2022   Lab Results  Component Value Date   CRP 3.0 10/14/2022    03/01/23 MRI brain - MRI scan of the brain with and without contrast showing only mild age-appropriate changes of chronic small vessel disease and generalized cerebral atrophy. No acute abnormalities are noted.   03/01/23 CT soft tissue neck 1. 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 Recommend direct visualization. 2. 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 Recommend ENT consultation. 3. No suspicious cervical lymphadenopathy. 4. Aortic Atherosclerosis (ICD10-I70.0) and Emphysema (ICD10-J43.9).     ASSESSMENT AND PLAN   72 y.o. year old male here with:  Dx:  1. Tongue mass   2. 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    Dispense:  30 tablet    Refill:  0   No follow-ups on file.  Virtual Visit via Video Note  I connected with Stephen Holland on 03/07/23 at  2:30 PM EDT by a video enabled telemedicine application and verified that I am speaking  with the correct person using two identifiers.   I discussed the limitations of evaluation and management by telemedicine and the availability of in person appointments. The patient expressed understanding and agreed to proceed.  Patient is at home and I am at the office.   I spent 15 minutes of face-to-face and non-face-to-face time with patient.  This included previsit chart review, lab review, study review, order entry, electronic health record documentation, patient education.     Suanne MarkerVIKRAM R. Erika Slaby, MD 03/07/2023, 2:47 PM Certified in Neurology, Neurophysiology and Neuroimaging  [facility] [address], [address] [place], [place] [address] [phone]

## 2023-03-15 DIAGNOSIS — C109 Malignant neoplasm of oropharynx, unspecified: Secondary | ICD-10-CM | POA: Diagnosis not present

## 2023-03-15 DIAGNOSIS — R634 Abnormal weight loss: Secondary | ICD-10-CM | POA: Diagnosis not present

## 2023-03-15 DIAGNOSIS — J387 Other diseases of larynx: Secondary | ICD-10-CM | POA: Diagnosis not present

## 2023-03-15 DIAGNOSIS — R1312 Dysphagia, oropharyngeal phase: Secondary | ICD-10-CM | POA: Diagnosis not present

## 2023-03-15 DIAGNOSIS — Z720 Tobacco use: Secondary | ICD-10-CM | POA: Diagnosis not present

## 2023-03-15 DIAGNOSIS — D490 Neoplasm of unspecified behavior of digestive system: Secondary | ICD-10-CM | POA: Diagnosis not present

## 2023-03-16 ENCOUNTER — Other Ambulatory Visit (HOSPITAL_COMMUNITY): Payer: Self-pay | Admitting: Otolaryngology

## 2023-03-16 DIAGNOSIS — D490 Neoplasm of unspecified behavior of digestive system: Secondary | ICD-10-CM

## 2023-03-17 ENCOUNTER — Other Ambulatory Visit: Payer: Self-pay | Admitting: Otolaryngology

## 2023-03-17 DIAGNOSIS — R1312 Dysphagia, oropharyngeal phase: Secondary | ICD-10-CM | POA: Diagnosis not present

## 2023-03-17 DIAGNOSIS — C109 Malignant neoplasm of oropharynx, unspecified: Secondary | ICD-10-CM | POA: Diagnosis not present

## 2023-03-17 NOTE — Progress Notes (Signed)
Oley Balm, MD  Leodis Rains D PROCEDURE / BIOPSY REVIEW Date: 03/16/23  Requested Biopsy site: R parotid Reason for request: nodule Imaging review: Best seen on CT 03/01/23 Im 49 Se 2   Decision: Approved Imaging modality to perform: Ultrasound Schedule with: Patient preference (Local vs Mod Sed) Schedule for: Any VIR  Additional comments:   Please contact me with questions, concerns, or if issue pertaining to this request arise.  Dayne Oley Balm, MD [facility]

## 2023-03-21 DIAGNOSIS — C07 Malignant neoplasm of parotid gland: Secondary | ICD-10-CM | POA: Diagnosis not present

## 2023-03-21 DIAGNOSIS — K148 Other diseases of tongue: Secondary | ICD-10-CM | POA: Diagnosis not present

## 2023-03-21 DIAGNOSIS — S2241XA Multiple fractures of ribs, right side, initial encounter for closed fracture: Secondary | ICD-10-CM | POA: Diagnosis not present

## 2023-03-21 DIAGNOSIS — Z79899 Other long term (current) drug therapy: Secondary | ICD-10-CM | POA: Diagnosis not present

## 2023-03-21 DIAGNOSIS — K118 Other diseases of salivary glands: Secondary | ICD-10-CM | POA: Diagnosis not present

## 2023-03-21 DIAGNOSIS — C109 Malignant neoplasm of oropharynx, unspecified: Secondary | ICD-10-CM | POA: Diagnosis not present

## 2023-03-22 ENCOUNTER — Telehealth: Payer: Self-pay | Admitting: Radiation Oncology

## 2023-03-22 NOTE — Telephone Encounter (Signed)
Patient wife stated the patient has decided to receive treatment at [facility]. Referral closed until further noticed.

## 2023-03-23 ENCOUNTER — Encounter (HOSPITAL_COMMUNITY): Payer: Self-pay | Admitting: Otolaryngology

## 2023-03-23 NOTE — Progress Notes (Signed)
Anesthesia Chart Review: Stephen Holland  Case: [id number] Date/Time: 03/24/23 1501   Procedure: DIRECT LARYNGOSCOPY WITH BIOPSIES   Anesthesia type: General   Pre-op diagnosis: Malignant neoplasm of oropharynx; Laryngeal nodule; Oropharyngeal dysphagia; Parotid tumor   [facility]: [facility] ROOM 09 / [facility]   Surgeons: Scarlette Ar, MD       DISCUSSION: Patient is a 72 year old male scheduled for the above procedure. Dx: Malignant neoplasm of oropharynx, laryngeal nodule, oropharyngeal dysphagia.  Other history includes smoking, HTN, HLD, CKD, exertional dyspnea, OSA (uses CPAP), GERD, prostate cancer (s/p prostatectomy 2003), gout.  Last visit with cardiologist Dr. 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 He was felt stable from CV standpoint. Denied CP, SOB. Had 20 lb intentional weight loss. +Smoker and was undergoing work-up for an "esophageal issue." 12 month follow-up planned.  Anesthesia team to evaluate on the day of surgery.    VS: Ht  (1.702 m)   Wt 82.8 kg   BMI 28.59 kg/m  BP Readings from Last 3 Encounters:  02/03/23 111/67  02/02/23 130/80  01/24/23 134/74   Pulse Readings from Last 3 Encounters:  02/03/23 66  02/02/23 (!) 53  01/24/23 60    PROVIDERS: Lucky Cowboy, MD is PCP  Nanetta Batty, MD is cardiologist Joycelyn Schmid, MD is neurologist Dorothyann Gibbs, MD is RAD-ONC Kathi Der, MD is GI   LABS: Most recent lab results in [facility] include: Lab Results  Component Value Date   WBC 7.7 02/02/2023   HGB 15.2 02/02/2023   HCT 44.4 02/02/2023   PLT 172 02/02/2023   GLUCOSE 90 02/02/2023   ALT 23 02/02/2023   AST 23 02/02/2023   NA 138 02/02/2023   K 4.8 02/02/2023   CL 105 02/02/2023   CREATININE 1.53 (H) 02/02/2023   BUN 27 (H) 02/02/2023   CO2 25 02/02/2023   TSH 0.44 02/02/2023   PSA 0.29 02/02/2023   HGBA1C 5.9 (H) 02/02/2023   MICROALBUR 41.2 02/02/2023   Creatinine ahs been ~ 1.1-1.60 since April 2023 in [facility].    IMAGES: PET Scan 03/21/23 ([facility]): - Bilateral base of tongue mass, compatible with the patient's known  or pharyngeal cancer.  - 1.7 cm soft tissue lesion in the right parotid gland, favoring a  benign or malignant primary parotid neoplasm such as Warthin's  tumor.  - 6 mm lesion in the left inferior parotid gland, favoring a small  intraparotid lymph node. Technically speaking, a small contralateral  parotid neoplasm is also possible.  - Healing fractures of the right anterior 4th, 5th, and 6th ribs.   MRI Abd 03/05/23: IMPRESSION: 1. Stable appearance of the minimal nodularity of liver contour. The 10 mm focus of subtle arterial phase hyperenhancement seen previously in segment VI is not visible on today's study. No other foci of arterial phase hyperenhancement on today's study. No new or suspicious hepatic lesion. 2. Stable bilateral simple and hemorrhagic/proteinaceous renal cysts requiring no further imaging follow-up.  MRI Brain 03/01/23: IMPRESSION: MRI scan of the brain with and without contrast showing only mild age-appropriate changes of chronic small vessel disease and generalized cerebral atrophy. No acute abnormalities are noted.   CT Soft tissue neck 03/01/23: IMPRESSION: 1. 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 Recommend direct visualization. 2. Enhancing mass of the right parotid gland measuring up to 1.8 cm, concerning for a primary parotid neoplasm such  as a Warthin's tumor or pleomorphic adenoma. Recommend ENT consultation. 3. No suspicious cervical lymphadenopathy. 4. Aortic Atherosclerosis (ICD10-I70.0) and Emphysema (ICD10-J43.9).   EKG: 02/02/2023: Normal sinus rhythm.  Poor R wave progression.   CV: CT Coronary 05/09/20: IMPRESSION: 1. Moderate to possibly severe, heavily calcified proximal  LAD stenosis with probably mild to moderate, but heavily calcified proximal to mid LCx disease, CADRADS = 3. 2. CT FFR will be performed and reported separately. 3. Coronary calcium score of 2703. This was 96th percentile for age and sex matched control. 4. Normal coronary origin with co-dominance. 5. Aortic atherosclerosis with aortic valve leaflet and annular calcification.  FFR 05/10/20:   1. Left Main:  No significant stenosis. FFR = 1.00 2. LAD: No significant stenosis. Proximal FFR = 0.99, Mid FFR = 0.92, Distal FFR = 0.85 3. LCX: No significant stenosis. Proximal FFR = 0.99, Distal FFR = 0.89 (modeled as occluded) 4. RCA: No significant stenosis. Proximal FFR = 0.99, Mid FFR = 0.94, Distal FFR = Not calculated IMPRESSION: 1. CT FFR analysis did not show any significant flow-limiting stenosis. The distal LCX was modeled as occluded, however, the L-PDA is patent with no flow limitation. 2. Aggressive medical therapy of calcified, non-obstructive CAD is recommended.   Echo 04/09/20: IMPRESSIONS   1. Left ventricular ejection fraction, by estimation, is 55 to 60%. The  left ventricle has normal function. The left ventricle has no regional  wall motion abnormalities. There is mild left ventricular hypertrophy.  Left ventricular diastolic parameters  are consistent with Grade I diastolic dysfunction (impaired relaxation).   2. Right ventricular systolic function is normal. The right ventricular  size is normal.   3. The mitral valve is abnormal. Trivial mitral valve regurgitation.   4. The aortic valve is tricuspid. Aortic valve regurgitation is not  visualized.   5. The inferior vena cava is normal in size with greater than 50%  respiratory variability, suggesting right atrial pressure of 3 mmHg.   6. Cannot exclude PFO by color doppler, consider limited bubble study.    Past Medical History:  Diagnosis Date   AKI (acute kidney injury) 03/01/2021   CKD (chronic kidney  disease) stage 3, GFR 30-59 ml/min 02/02/2023   stage 3a - GFR 48   Colon polyps    DJD (degenerative joint disease)    Dyspnea    with exertion   ED (erectile dysfunction)    GERD (gastroesophageal reflux disease)    Gout    Hyperlipemia    Hypertension    Obstructive sleep apnea    08/2012, cpap setting of 14   Prostate cancer 01/2002   s/p prostatectomy   Vitamin D deficiency     Past Surgical History:  Procedure Laterality Date   CATARACT EXTRACTION, BILATERAL Bilateral 12/2020   Dr. Darel Hong   COLONOSCOPY WITH PROPOFOL N/A 09/06/2016   Procedure: COLONOSCOPY WITH PROPOFOL;  Surgeon: Charolett Bumpers, MD;  Location: [facility];  Service: Endoscopy;  Laterality: N/A;   colonscopy     x 3 with polyp one time   PROSTATECTOMY  2003   SKIN CANCER EXCISION      MEDICATIONS: No current facility-administered medications for this encounter.    acetaminophen (TYLENOL) 650 MG CR tablet   bisoprolol (ZEBETA) 10 MG tablet   Cholecalciferol (VITAMIN D3) 125 MCG (5000 UT) CAPS   clonazePAM (KLONOPIN) 1 MG tablet   gabapentin (NEURONTIN) 600 MG tablet   HYDROcodone-acetaminophen (NORCO/VICODIN) 5-325 MG tablet   KRILL OIL PO   meloxicam (  MOBIC) 15 MG tablet   Multiple Vitamins-Minerals (MEGA 2000 PO)   pantoprazole (PROTONIX) 40 MG tablet   rosuvastatin (CRESTOR) 40 MG tablet   telmisartan (MICARDIS) 80 MG tablet   fluconazole (DIFLUCAN) 100 MG tablet   hyoscyamine (LEVSIN) 0.125 MG tablet   triamcinolone ointment (KENALOG) 0.1 %    Shonna Chock, PA-C Surgical Short Stay/Anesthesiology [facility] Phone [phone] [facility] Phone [phone] 03/23/2023 2:31 PM

## 2023-03-23 NOTE — Anesthesia Preprocedure Evaluation (Signed)
Anesthesia Evaluation    Airway        Dental   Pulmonary Current Smoker          Cardiovascular hypertension,      Neuro/Psych    GI/Hepatic   Endo/Other    Renal/GU      Musculoskeletal   Abdominal   Peds  Hematology   Anesthesia Other Findings   Reproductive/Obstetrics                             Anesthesia Physical Anesthesia Plan  ASA:   Anesthesia Plan:    Post-op Pain Management:    Induction:   PONV Risk Score and Plan:   Airway Management Planned:   Additional Equipment:   Intra-op Plan:   Post-operative Plan:   Informed Consent:   Plan Discussed with:   Anesthesia Plan Comments: (PAT note written 03/23/2023 by Allison Zelenak, PA-C.  )       Anesthesia Quick Evaluation    benefits and alternatives for the proposed anesthesia with the patient or authorized representative who has indicated his/her understanding and acceptance.     Dental advisory given  Plan Discussed with: CRNA  Anesthesia Plan Comments: (PAT note written 03/23/2023 by Shonna Chock, PA-C.  )        Anesthesia Quick Evaluation

## 2023-03-23 NOTE — Progress Notes (Signed)
PCP - Dr Lucky Cowboy Cardiologist - Dr Nanetta Batty Neurology - Dr Joycelyn Schmid  CT Chest x-ray - 04/22/21 EKG - 02/02/23 Stress Test - n/a ECHO - 04/09/20 Cardiac Cath - n/a  ICD Pacemaker/Loop - n/a  Sleep Study -  Yes, 08/2012 CPAP - uses CPAP, setting of 14  Diabetes - n/a  ERAS: Clear liquids til 12 noon DOS.  Anesthesia review: Yes  STOP now taking any Aspirin (unless otherwise instructed by your surgeon), Aleve, Naproxen, Ibuprofen, Motrin, Advil, Goody's, BC's, all herbal medications, fish oil, and all vitamins.   Coronavirus Screening Do you have any of the following symptoms:  Cough yes/no: No Fever (>100.58F)  yes/no: No Runny nose yes/no: No Sore throat Yes Difficulty breathing/shortness of breath  Yes  Have you traveled in the last 14 days and where? [place]  Patient verbalized understanding of instructions that were given via phone.

## 2023-03-24 ENCOUNTER — Ambulatory Visit (HOSPITAL_COMMUNITY)
Admission: RE | Admit: 2023-03-24 | Discharge: 2023-03-24 | Disposition: A | Discharge status: Home / Self Care | Payer: Medicare Other | Attending: Otolaryngology | Admitting: Otolaryngology

## 2023-03-24 ENCOUNTER — Ambulatory Visit (HOSPITAL_BASED_OUTPATIENT_CLINIC_OR_DEPARTMENT_OTHER): Payer: Medicare Other | Admitting: Vascular Surgery

## 2023-03-24 ENCOUNTER — Encounter (HOSPITAL_COMMUNITY)
Admission: RE | Disposition: A | Discharge status: Home / Self Care | Payer: Self-pay | Source: Home / Self Care | Attending: Otolaryngology

## 2023-03-24 ENCOUNTER — Encounter (HOSPITAL_COMMUNITY): Payer: Self-pay | Admitting: Otolaryngology

## 2023-03-24 ENCOUNTER — Other Ambulatory Visit: Payer: Self-pay

## 2023-03-24 ENCOUNTER — Ambulatory Visit (HOSPITAL_COMMUNITY): Payer: Medicare Other | Admitting: Vascular Surgery

## 2023-03-24 DIAGNOSIS — M109 Gout, unspecified: Secondary | ICD-10-CM | POA: Insufficient documentation

## 2023-03-24 DIAGNOSIS — I2510 Atherosclerotic heart disease of native coronary artery without angina pectoris: Secondary | ICD-10-CM | POA: Insufficient documentation

## 2023-03-24 DIAGNOSIS — I700 Atherosclerosis of aorta: Secondary | ICD-10-CM | POA: Insufficient documentation

## 2023-03-24 DIAGNOSIS — N183 Chronic kidney disease, stage 3 unspecified: Secondary | ICD-10-CM | POA: Diagnosis not present

## 2023-03-24 DIAGNOSIS — I129 Hypertensive chronic kidney disease with stage 1 through stage 4 chronic kidney disease, or unspecified chronic kidney disease: Secondary | ICD-10-CM | POA: Insufficient documentation

## 2023-03-24 DIAGNOSIS — J387 Other diseases of larynx: Secondary | ICD-10-CM | POA: Diagnosis not present

## 2023-03-24 DIAGNOSIS — F17210 Nicotine dependence, cigarettes, uncomplicated: Secondary | ICD-10-CM | POA: Diagnosis not present

## 2023-03-24 DIAGNOSIS — C01 Malignant neoplasm of base of tongue: Secondary | ICD-10-CM | POA: Diagnosis not present

## 2023-03-24 DIAGNOSIS — R1312 Dysphagia, oropharyngeal phase: Secondary | ICD-10-CM | POA: Insufficient documentation

## 2023-03-24 DIAGNOSIS — K148 Other diseases of tongue: Secondary | ICD-10-CM | POA: Diagnosis not present

## 2023-03-24 DIAGNOSIS — K219 Gastro-esophageal reflux disease without esophagitis: Secondary | ICD-10-CM | POA: Diagnosis not present

## 2023-03-24 DIAGNOSIS — C109 Malignant neoplasm of oropharynx, unspecified: Secondary | ICD-10-CM

## 2023-03-24 DIAGNOSIS — Z79899 Other long term (current) drug therapy: Secondary | ICD-10-CM | POA: Diagnosis not present

## 2023-03-24 DIAGNOSIS — Z8546 Personal history of malignant neoplasm of prostate: Secondary | ICD-10-CM | POA: Insufficient documentation

## 2023-03-24 DIAGNOSIS — J449 Chronic obstructive pulmonary disease, unspecified: Secondary | ICD-10-CM | POA: Diagnosis not present

## 2023-03-24 DIAGNOSIS — G4733 Obstructive sleep apnea (adult) (pediatric): Secondary | ICD-10-CM | POA: Insufficient documentation

## 2023-03-24 DIAGNOSIS — I10 Essential (primary) hypertension: Secondary | ICD-10-CM | POA: Diagnosis not present

## 2023-03-24 DIAGNOSIS — Z08 Encounter for follow-up examination after completed treatment for malignant neoplasm: Secondary | ICD-10-CM | POA: Insufficient documentation

## 2023-03-24 DIAGNOSIS — E785 Hyperlipidemia, unspecified: Secondary | ICD-10-CM | POA: Insufficient documentation

## 2023-03-24 DIAGNOSIS — D490 Neoplasm of unspecified behavior of digestive system: Secondary | ICD-10-CM | POA: Diagnosis not present

## 2023-03-24 DIAGNOSIS — J439 Emphysema, unspecified: Secondary | ICD-10-CM | POA: Insufficient documentation

## 2023-03-24 DIAGNOSIS — Z9079 Acquired absence of other genital organ(s): Secondary | ICD-10-CM | POA: Insufficient documentation

## 2023-03-24 DIAGNOSIS — Z800 Family history of malignant neoplasm of digestive organs: Secondary | ICD-10-CM | POA: Diagnosis not present

## 2023-03-24 DIAGNOSIS — F17200 Nicotine dependence, unspecified, uncomplicated: Secondary | ICD-10-CM | POA: Diagnosis not present

## 2023-03-24 HISTORY — PX: DIRECT LARYNGOSCOPY: SHX5326

## 2023-03-24 HISTORY — DX: Dyspnea, unspecified: R06.00

## 2023-03-24 HISTORY — DX: Gastro-esophageal reflux disease without esophagitis: K21.9

## 2023-03-24 LAB — CBC
HCT: 46.7 % (ref 39.0–52.0)
Hemoglobin: 15.4 g/dL (ref 13.0–17.0)
MCH: 33 pg (ref 26.0–34.0)
MCHC: 33 g/dL (ref 30.0–36.0)
MCV: 100.2 fL — ABNORMAL HIGH (ref 80.0–100.0)
Platelets: 172 10*3/uL (ref 150–400)
RBC: 4.66 MIL/uL (ref 4.22–5.81)
RDW: 13.7 % (ref 11.5–15.5)
WBC: 11.1 10*3/uL — ABNORMAL HIGH (ref 4.0–10.5)
nRBC: 0 % (ref 0.0–0.2)

## 2023-03-24 LAB — BASIC METABOLIC PANEL
Anion gap: 11 (ref 5–15)
BUN: 27 mg/dL — ABNORMAL HIGH (ref 8–23)
CO2: 24 mmol/L (ref 22–32)
Calcium: 9.3 mg/dL (ref 8.9–10.3)
Chloride: 104 mmol/L (ref 98–111)
Creatinine, Ser: 1.36 mg/dL — ABNORMAL HIGH (ref 0.61–1.24)
GFR, Estimated: 55 mL/min — ABNORMAL LOW (ref >60)
Glucose, Bld: 94 mg/dL (ref 70–99)
Potassium: 4.1 mmol/L (ref 3.5–5.1)
Sodium: 139 mmol/L (ref 135–145)

## 2023-03-24 SURGERY — LARYNGOSCOPY, DIRECT
Anesthesia: General

## 2023-03-24 MED ORDER — SUCCINYLCHOLINE CHLORIDE 200 MG/10ML IV SOSY
PREFILLED_SYRINGE | INTRAVENOUS | Status: DC | PRN
  Administered 2023-03-24: 120 mg via INTRAVENOUS

## 2023-03-24 MED ORDER — LIDOCAINE 2% (20 MG/ML) 5 ML SYRINGE
INTRAMUSCULAR | Status: AC
  Filled 2023-03-24: qty 5

## 2023-03-24 MED ORDER — DEXAMETHASONE SODIUM PHOSPHATE 10 MG/ML IJ SOLN
INTRAMUSCULAR | Status: AC
  Filled 2023-03-24: qty 2

## 2023-03-24 MED ORDER — PROPOFOL 10 MG/ML IV BOLUS
INTRAVENOUS | Status: DC | PRN
  Administered 2023-03-24: 30 mg via INTRAVENOUS
  Administered 2023-03-24: 110 mg via INTRAVENOUS

## 2023-03-24 MED ORDER — ORAL CARE MOUTH RINSE: 15.0000 mL | Freq: Once | OROMUCOSAL | Status: AC

## 2023-03-24 MED ORDER — CHLORHEXIDINE GLUCONATE 0.12 % MT SOLN: 15.0000 mL | Freq: Once | OROMUCOSAL | Status: AC

## 2023-03-24 MED ORDER — LACTATED RINGERS IV SOLN: INTRAVENOUS | Status: DC

## 2023-03-24 MED ORDER — ACETAMINOPHEN 500 MG PO TABS: 1000.0000 mg | ORAL_TABLET | Freq: Once | ORAL | Status: DC | PRN

## 2023-03-24 MED ORDER — ACETAMINOPHEN 10 MG/ML IV SOLN: 1000.0000 mg | Freq: Once | INTRAVENOUS | Status: DC | PRN

## 2023-03-24 MED ORDER — FENTANYL CITRATE (PF) 250 MCG/5ML IJ SOLN
INTRAMUSCULAR | Status: DC | PRN
  Administered 2023-03-24: 75 ug via INTRAVENOUS

## 2023-03-24 MED ORDER — LIDOCAINE-EPINEPHRINE 1 %-1:100000 IJ SOLN
INTRAMUSCULAR | Status: AC
  Filled 2023-03-24: qty 1

## 2023-03-24 MED ORDER — SUCCINYLCHOLINE CHLORIDE 200 MG/10ML IV SOSY
PREFILLED_SYRINGE | INTRAVENOUS | Status: AC
  Filled 2023-03-24: qty 20

## 2023-03-24 MED ORDER — FENTANYL CITRATE (PF) 250 MCG/5ML IJ SOLN
INTRAMUSCULAR | Status: AC
  Filled 2023-03-24: qty 5

## 2023-03-24 MED ORDER — 0.9 % SODIUM CHLORIDE (POUR BTL) OPTIME
TOPICAL | Status: DC | PRN
  Administered 2023-03-24: 1000 mL

## 2023-03-24 MED ORDER — ONDANSETRON HCL 4 MG/2ML IJ SOLN
INTRAMUSCULAR | Status: DC | PRN
  Administered 2023-03-24: 4 mg via INTRAVENOUS

## 2023-03-24 MED ORDER — EPHEDRINE 5 MG/ML INJ
INTRAVENOUS | Status: AC
  Filled 2023-03-24: qty 5

## 2023-03-24 MED ORDER — PHENYLEPHRINE 80 MCG/ML (10ML) SYRINGE FOR IV PUSH (FOR BLOOD PRESSURE SUPPORT)
PREFILLED_SYRINGE | INTRAVENOUS | Status: AC
  Filled 2023-03-24: qty 20

## 2023-03-24 MED ORDER — FENTANYL CITRATE (PF) 100 MCG/2ML IJ SOLN: 25.0000 ug | INTRAMUSCULAR | Status: DC | PRN

## 2023-03-24 MED ORDER — PROPOFOL 500 MG/50ML IV EMUL
INTRAVENOUS | Status: DC | PRN
  Administered 2023-03-24: 100 ug/kg/min via INTRAVENOUS

## 2023-03-24 MED ORDER — DEXAMETHASONE SODIUM PHOSPHATE 10 MG/ML IJ SOLN
INTRAMUSCULAR | Status: DC | PRN
  Administered 2023-03-24: 10 mg via INTRAVENOUS

## 2023-03-24 MED ORDER — SUGAMMADEX SODIUM 200 MG/2ML IV SOLN
INTRAVENOUS | Status: DC | PRN
  Administered 2023-03-24: 200 mg via INTRAVENOUS

## 2023-03-24 MED ORDER — ROCURONIUM BROMIDE 10 MG/ML (PF) SYRINGE
PREFILLED_SYRINGE | INTRAVENOUS | Status: AC
  Filled 2023-03-24: qty 20

## 2023-03-24 MED ORDER — ONDANSETRON HCL 4 MG/2ML IJ SOLN
INTRAMUSCULAR | Status: AC
  Filled 2023-03-24: qty 2

## 2023-03-24 MED ORDER — CHLORHEXIDINE GLUCONATE 0.12 % MT SOLN
OROMUCOSAL | Status: AC
  Administered 2023-03-24: 15 mL via OROMUCOSAL
  Filled 2023-03-24: qty 15

## 2023-03-24 MED ORDER — ROCURONIUM BROMIDE 10 MG/ML (PF) SYRINGE
PREFILLED_SYRINGE | INTRAVENOUS | Status: DC | PRN
  Administered 2023-03-24: 20 mg via INTRAVENOUS

## 2023-03-24 MED ORDER — LIDOCAINE 2% (20 MG/ML) 5 ML SYRINGE
INTRAMUSCULAR | Status: DC | PRN
  Administered 2023-03-24: 60 mg via INTRAVENOUS

## 2023-03-24 MED ORDER — ACETAMINOPHEN 160 MG/5ML PO SOLN: 1000.0000 mg | Freq: Once | ORAL | Status: DC | PRN

## 2023-03-24 SURGICAL SUPPLY — 13 items
BAG COUNTER SPONGE SURGICOUNT (BAG) ×1 IMPLANT
BAG SPNG CNTER NS LX DISP (BAG) ×1
CANISTER SUCT 3000ML PPV (MISCELLANEOUS) ×1 IMPLANT
COVER BACK TABLE 60X90IN (DRAPES) ×1 IMPLANT
COVER MAYO STAND STRL (DRAPES) ×1 IMPLANT
DRAPE HALF SHEET 40X57 (DRAPES) ×1 IMPLANT
GLOVE BIO SURGEON STRL SZ7.5 (GLOVE) ×1 IMPLANT
KIT BASIN OR (CUSTOM PROCEDURE TRAY) ×1 IMPLANT
KIT TURNOVER KIT B (KITS) ×1 IMPLANT
NS IRRIG 1000ML POUR BTL (IV SOLUTION) ×1 IMPLANT
PAD ARMBOARD 7.5X6 YLW CONV (MISCELLANEOUS) ×2 IMPLANT
TOWEL GREEN STERILE (TOWEL DISPOSABLE) ×1 IMPLANT
TUBE CONNECTING 12X1/4 (SUCTIONS) ×1 IMPLANT

## 2023-03-24 NOTE — Discharge Instructions (Signed)
[facility] POST OP INSTRUCTIONS: LARYNGOSCOPY  The Surgery Itself Laryngoscopy with biopsy or injection involves a brief general anesthesia, typically for  less than one hour. Patients may be sedated for several hours after surgery and may  remain sleepy for the better part of the day. Nausea and vomiting are occasionally seen,  and usually resolve by the evening of surgery - even without additional medications.  Almost all patients can go home the day of surgery.  After Surgery ? You will have a sore throat from the metal instruments used to allow a good view  of your voice box for 3-5 days after surgery. Some patients may have sores on  the tongue or in the mouth. This is normal and you will be given pain  medications for this. If you have sores in the mouth, avoid citrus or acidic  foods/drinks since they will burn.  ? Avoid coughing or frequent throat clearing. Drinking water can help alleviate the  urge to clear the throat. ? Avoid any heavy lifting (more than 20 lbs), straining, exercise, or sports activities  for 2 weeks after surgery. ? Avoid alcohol, tobacco products, spicy foods, or eating late at night as these may  cause heartburn or stomach reflux and may delay the healing process. ? Your voice may be hoarse after surgery from swelling of the vocal cords caused  by manipulation. ? You do not have to avoid talking unless your surgeon gives you specific  instructions. Use your normal voice since whispering is harder on your vocal  cords then talking at a regular volume. ? If your surgeon advises voice rest, you should do the following: o You are to have absolute voice rest for 7 days. You may want to purchase  a dry erase board to communicate during this time. After that, you may  begin to use your voice at a soft spoken level. o You should only speak loud enough for people to hear you that are within  an arm's length away. Do not yell or whisper. You may increase your   voice use by 5 minutes/hour each day after the first 7 days of rest.  Medications ? Pain medication can be used for pain as prescribed. Pain in the throat and the  tongue is normal. ? Some patients will be given steroids or acid reducing medications after surgery,  take these as directed. ? Take all of your routine medications as prescribed, unless told otherwise by your  surgeon. Any medications that thin the blood should be avoided unless approved  by your surgeon.  ? IT IS OK TO TAKE OVER THE COUNTER PAIN MEDICATION  (IBUPROFEN, NAPROXEN, or ACETAMINOPHEN) IN ADDITION TO  YOUR PRESCRIBED MEDICATIONS. DO NOT TAKE ASPIRIN UNLESS  CLEARED WITH YOUR SURGEON.  ? Limit Acetaminophen/Tylenol to less than 4,000mg/day  ? Limit Ibuprofen/Motrin to less than 3,600mg/day  Final Result ? Following vocal cord injection, the voice may seem "strangled" due to swelling for  a few days or weeks. This is normal.  ? If you have a biopsy in surgery, you will usually find out the pathology results  when you are seen in the office for follow up. ? Many patients benefit from voice therapy after surgery to improve the long term  result. Your surgeon will recommend this if you could benefit from it  

## 2023-03-24 NOTE — H&P (Signed)
Stephen Holland is an 72 y.o. male.    Chief Complaint:  Base of tongue malignancy   HPI: Patient presents today for planned elective procedure.  He/she denies any interval change in history since office visit on 03/15/23.  Past Medical History:  Diagnosis Date   AKI (acute kidney injury) 03/01/2021   CKD (chronic kidney disease) stage 3, GFR 30-59 ml/min 02/02/2023   stage 3a - GFR 48   Colon polyps    DJD (degenerative joint disease)    Dyspnea    with exertion   ED (erectile dysfunction)    GERD (gastroesophageal reflux disease)    Gout    Hyperlipemia    Hypertension    Obstructive sleep apnea    08/2012, cpap setting of 14   Prostate cancer 01/2002   s/p prostatectomy   Vitamin D deficiency     Past Surgical History:  Procedure Laterality Date   CATARACT EXTRACTION, BILATERAL Bilateral 12/2020   Dr. Darel Hong   COLONOSCOPY WITH PROPOFOL N/A 09/06/2016   Procedure: COLONOSCOPY WITH PROPOFOL;  Surgeon: Charolett Bumpers, MD;  Location: [facility];  Service: Endoscopy;  Laterality: N/A;   colonscopy     x 3 with polyp one time   PROSTATECTOMY  2003   SKIN CANCER EXCISION      Family History  Problem Relation Age of Onset   Hypertension Mother    Pulmonary fibrosis Mother    Stroke Father    Heart attack Maternal Grandmother    Stroke Maternal Grandfather    Stroke Paternal Grandmother    Colon cancer Paternal Grandfather     Social History:  reports that he has been smoking cigarettes. He has a 55.00 pack-year smoking history. He has never used smokeless tobacco. He reports that he does not currently use alcohol after a past usage of about 7.0 - 14.0 standard drinks of alcohol per week. He reports that he does not use drugs.  Allergies:  Allergies  Allergen Reactions   Allopurinol Rash    Medications Prior to Admission  Medication Sig Dispense Refill   acetaminophen (TYLENOL) 650 MG CR tablet Take 650 mg by mouth every 8 (eight) hours as needed for pain.      bisoprolol (ZEBETA) 10 MG tablet TAKE 1 TABLET BY MOUTH EVERY DAY FOR BLOOD PRESSURE 90 tablet 1   Cholecalciferol (VITAMIN D3) 125 MCG (5000 UT) CAPS Take 10,000 Units by mouth See admin instructions. Take tues., thurs., Sat., Sun.     clonazePAM (KLONOPIN) 1 MG tablet TAKE 1/2 TO 1 TABLETS BY MOUTH AT BEDTIME AS NEEDED (RESTLESS LEG SYNDROME). (Patient taking differently: Take 1 mg by mouth 4 (four) times a week.) 90 tablet 0   gabapentin (NEURONTIN) 600 MG tablet TAKE 1/2 TO 1 TABLET 2 TO 3 TIMES DAILY AS NEEDED FOR NEUROPATHY PAIN (Patient taking differently: Take 600 mg by mouth 2 (two) times daily.) 270 tablet 3   HYDROcodone-acetaminophen (NORCO/VICODIN) 5-325 MG tablet Take 1 tablet by mouth every 8 (eight) hours as needed for moderate pain (throat pain; ear pain). 30 tablet 0   KRILL OIL PO Take 750 mg by mouth every morning. Mega Red     meloxicam (MOBIC) 15 MG tablet TAKE 1 TABLET BY MOUTH DAILY AS NEEDED WITH FOOD FOR PAIN & INFLAMMATION (Patient taking differently: Take 15 mg by mouth in the morning.) 90 tablet 3   Multiple Vitamins-Minerals (MEGA 2000 PO) Take by mouth.     pantoprazole (PROTONIX) 40 MG tablet TAKE 1 TABLET  BY MOUTH EVERY DAY 90 tablet 1   rosuvastatin (CRESTOR) 40 MG tablet TAKE 1 TABLET BY MOUTH EVERY DAY FOR CHOLESTEROL 90 tablet 3   telmisartan (MICARDIS) 80 MG tablet TAKE 1 TABLET BY MOUTH EVERY DAY FOR BLOOD PRESSURE (Patient taking differently: Take 80 mg by mouth every evening. FOR BLOOD PRESSURE) 90 tablet 3   fluconazole (DIFLUCAN) 100 MG tablet Take one tablet by mouth on Monday and Thursday during radiation.     hyoscyamine (LEVSIN) 0.125 MG tablet Take 1 tablet (0.125 mg total) by mouth every 4 (four) hours as needed for cramping (diarrhea, nausea). (Patient not taking: Reported on 03/22/2023) 50 tablet 2   triamcinolone ointment (KENALOG) 0.1 % APPLY A THIN LAYER TO AFFECTED AREA TWICE DAILY BUT NOT WITHIN 2 HOURS PRIOR TO RADIATION THERAPY      No results  found for this or any previous visit (from the past 48 hour(s)). No results found.  ROS: negative other than stated in HPI  Blood pressure (!) 144/87, pulse (!) 57, temperature (!) 97.5 F (36.4 C), temperature source Oral, resp. rate 18, height  (1.702 m), weight 82.8 kg, SpO2 97 %.  PHYSICAL EXAM: General: Resting comfortably in NAD  Lungs: Non-labored respiratinos  Studies Reviewed: 03/03/23.  IMPRESSION: 1. 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 Recommend direct visualization. 2. 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 Recommend ENT consultation. 3. No suspicious cervical lymphadenopathy. 4. Aortic Atherosclerosis (ICD10-I70.0) and Emphysema (ICD10-J43.9).     Electronically Signed   By: Orvan Falconer M.D.   On: 03/03/2023 15:38   Assessment/Plan Oropharynx - base of tongue malignancy   Proceed with Direct laryngoscopy with biopsies. Informed consent obtained. R/B/A/ discussed.     Electronically signed by:  Scarlette Ar, MD  Staff Physician Facial Plastic & Reconstructive Surgery Otolaryngology - Head and Neck Surgery [facility]  03/24/2023, 12:52 PM

## 2023-03-24 NOTE — Op Note (Signed)
OPERATIVE NOTE  Stephen Holland Date/Time of Admission: 03/24/2023 12:20 PM  CSN: [id number];XBM:[id number] Attending Provider: Scarlette Ar, MD Room/Bed: MCPO/NONE DOB: Apr 25, 1951 Age: 72 y.o.   Pre-Op Diagnosis: Neoplasm of oral-pharynx  Post-Op Diagnosis: Neoplasm of oral-pharynx  Procedure: Procedure(s): DIRECT LARYNGOSCOPY WITH BIOPSIES  Anesthesia: General  Surgeon(s): Mervin Kung, MD  Staff: Circulator: Lennox Laity, RN Relief Circulator: Towanda Octave, RN Scrub Person: Lake Bells R  Implants: * No implants in log *  Specimens: ID Type Source Tests Collected by Time Destination  1 : Base of tongue to rule out carcinoma Tissue PATH Other SURGICAL PATHOLOGY Scarlette Ar, MD 03/24/2023 1611     Complications: none  EBL: 10 ML  IVF: Per anesthesia ML  Condition: stable  Operative Findings:  Lingual tonsil hypertrophy with friable tissue centrally concerning for tumor Larynx, hypopharynx normal  Description of Operation: The patient was brought to the operating room on 03/24/2023 and placed in supine position on the operating table. General endotracheal anesthesia was established without difficulty. When the patient was adequately anesthetized, surgical timeout was performed and correct identification of the patient and the surgical procedure. The patient was positioned and prepped and draped in sterile fashion.  A laryngoscope was used to examine the patient's oral cavity, oropharynx and larynx.  Findings include base of tongue mass. Multiple biopsies were taken. Hemostasis achieved with 1:1000 epinephrine soaked cottonoid pledgets.  There was no significant bleeding and the patient's airway was stable.  An orogastric tube was passed and stomach contents were aspirated. Patient was awakened from anesthetic and transferred from the operating room to the recovery room in stable condition. There were no complications and blood loss was  minimal.   Mervin Kung, MD [facility]  03/24/2023

## 2023-03-24 NOTE — Transfer of Care (Signed)
Immediate Anesthesia Transfer of Care Note  Patient: Stephen Holland  Procedure(s) Performed: DIRECT LARYNGOSCOPY WITH BIOPSIES  Patient Location: PACU  Anesthesia Type:General  Level of Consciousness: awake, alert , and oriented  Airway & Oxygen Therapy: Patient Spontanous Breathing  Post-op Assessment: Report given to RN, Post -op Vital signs reviewed and stable, and Patient moving all extremities X 4  Post vital signs: Reviewed and stable  Last Vitals:  Vitals Value Taken Time  BP 126/66   Temp    Pulse 61 03/24/23 1631  Resp 14   SpO2 92 % 03/24/23 1631  Vitals shown include unvalidated device data.  Last Pain:  Vitals:   03/24/23 1246  TempSrc: Oral  PainSc: 0-No pain         Complications: No notable events documented.

## 2023-03-24 NOTE — Anesthesia Procedure Notes (Signed)
Procedure Name: Intubation Date/Time: 03/24/2023 4:05 PM  Performed by: Nils Pyle, CRNAPre-anesthesia Checklist: Patient identified, Emergency Drugs available, Suction available and Patient being monitored Patient Re-evaluated:Patient Re-evaluated prior to induction Oxygen Delivery Method: Circle System Utilized Preoxygenation: Pre-oxygenation with 100% oxygen Induction Type: IV induction and Rapid sequence Laryngoscope Size: Glidescope and 4 Grade View: Grade I Tube type: Oral Tube size: 7.0 mm Number of attempts: 1 Airway Equipment and Method: Stylet and Oral airway Placement Confirmation: ETT inserted through vocal cords under direct vision, positive ETCO2 and breath sounds checked- equal and bilateral Secured at: 23 cm Tube secured with: Tape Dental Injury: Teeth and Oropharynx as per pre-operative assessment

## 2023-03-25 ENCOUNTER — Encounter (HOSPITAL_COMMUNITY): Payer: Self-pay | Admitting: Otolaryngology

## 2023-03-25 NOTE — Anesthesia Postprocedure Evaluation (Signed)
Anesthesia Post Note  Patient: Stephen Holland  Procedure(s) Performed: DIRECT LARYNGOSCOPY WITH BIOPSIES     Patient location during evaluation: PACU Anesthesia Type: General Level of consciousness: awake and alert Pain management: pain level controlled Vital Signs Assessment: post-procedure vital signs reviewed and stable Respiratory status: spontaneous breathing, nonlabored ventilation and respiratory function stable Cardiovascular status: blood pressure returned to baseline and stable Postop Assessment: no apparent nausea or vomiting Anesthetic complications: no   No notable events documented.  Last Vitals:  Vitals:   03/24/23 1645 03/24/23 1700  BP: 133/64 (!) 145/61  Pulse: 61 (!) 58  Resp: 16 17  Temp:  (!) 36.3 C  SpO2: 93% 93%    Last Pain:  Vitals:   03/24/23 1700  TempSrc:   PainSc: 0-No pain                 [name]

## 2023-03-28 ENCOUNTER — Ambulatory Visit: Payer: Medicare Other | Admitting: Nurse Practitioner

## 2023-03-29 LAB — SURGICAL PATHOLOGY

## 2023-03-30 DIAGNOSIS — C109 Malignant neoplasm of oropharynx, unspecified: Secondary | ICD-10-CM | POA: Diagnosis not present

## 2023-03-30 DIAGNOSIS — R1312 Dysphagia, oropharyngeal phase: Secondary | ICD-10-CM | POA: Diagnosis not present

## 2023-03-30 DIAGNOSIS — C108 Malignant neoplasm of overlapping sites of oropharynx: Secondary | ICD-10-CM | POA: Diagnosis not present

## 2023-03-31 IMAGING — CR DG ELBOW COMPLETE 3+V*L*
4 series · 4 of 4 positions shown · non-contrast
Comparison: None.

CLINICAL DATA: Cellulitis.

EXAM:
LEFT ELBOW - COMPLETE 3+ VIEW

[x elbow ap left]
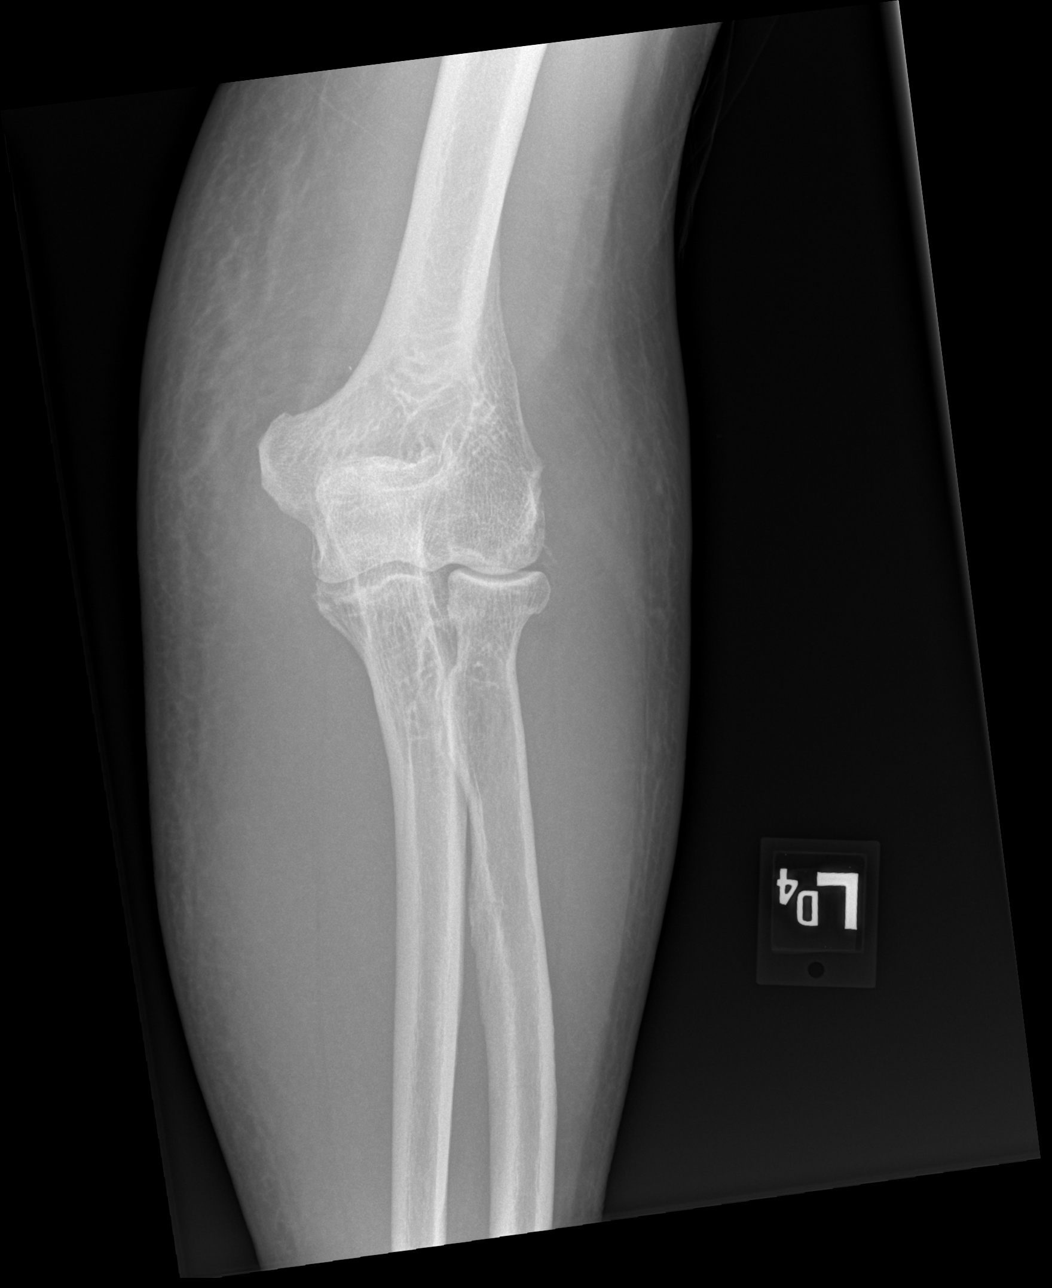

[x elbow obl left (1 of 2)]
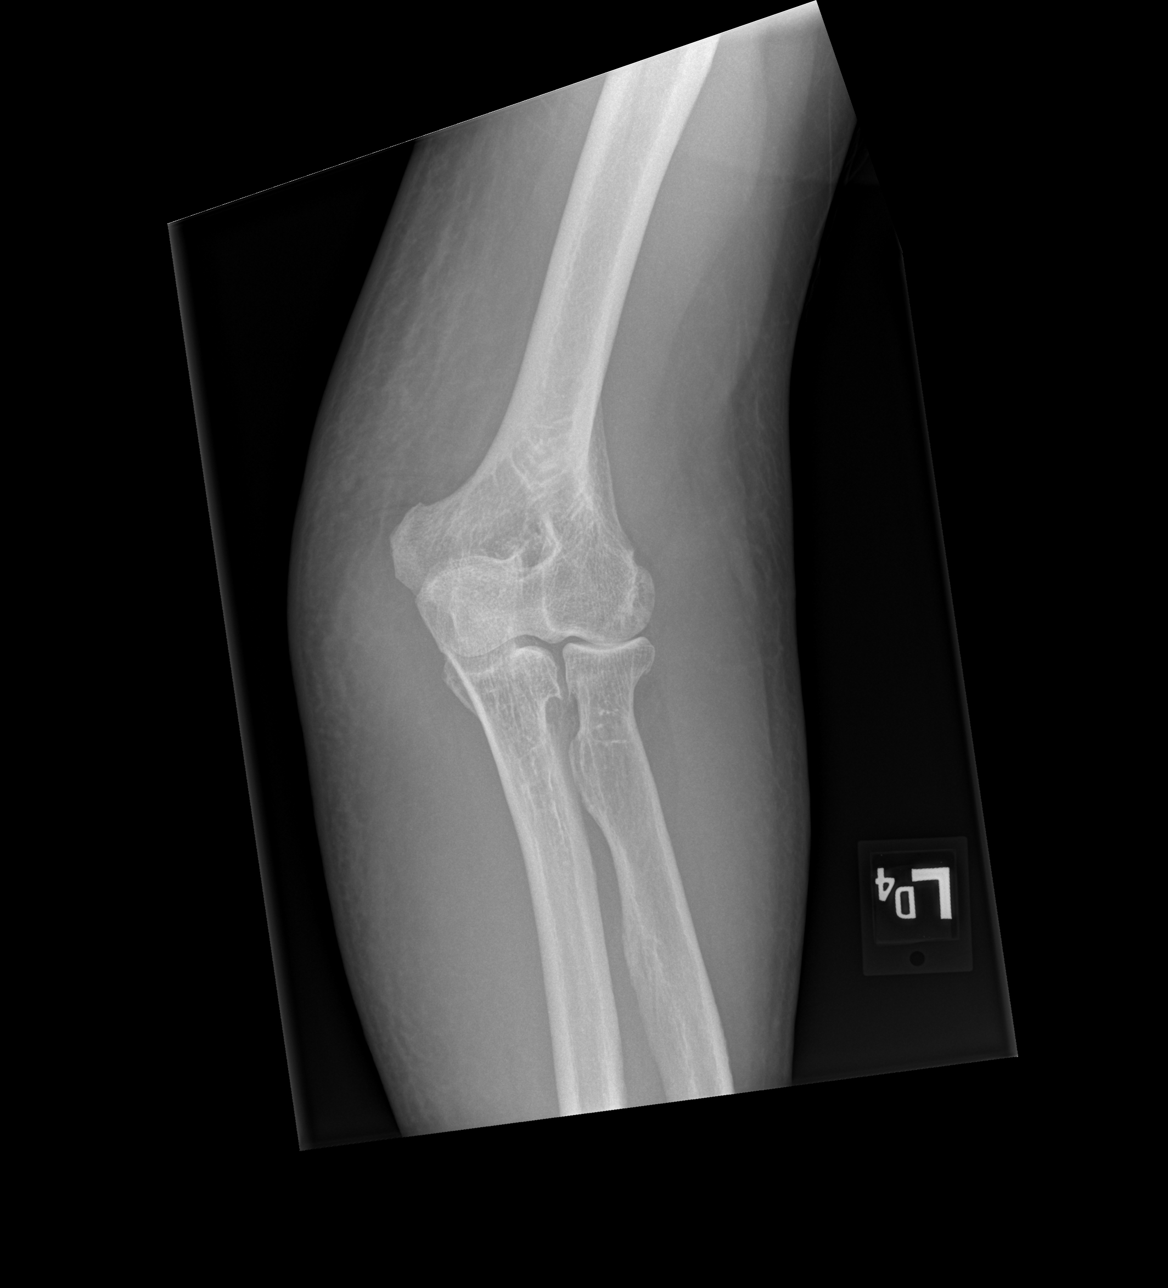

[x elbow obl left (2 of 2)]
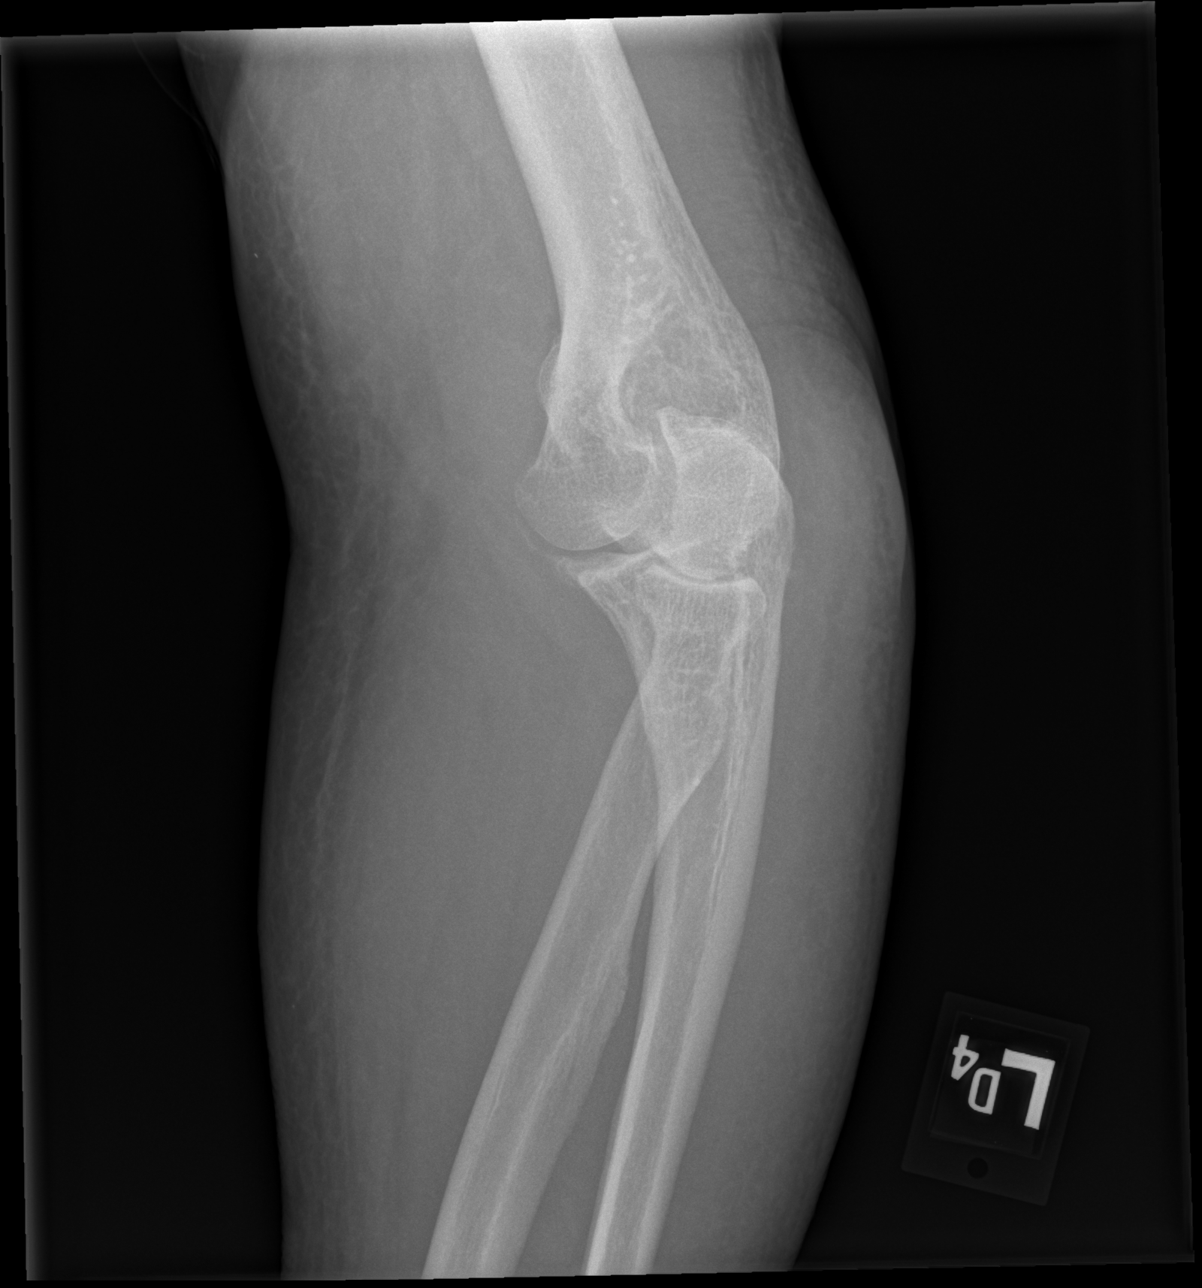

[x elbow lat left]
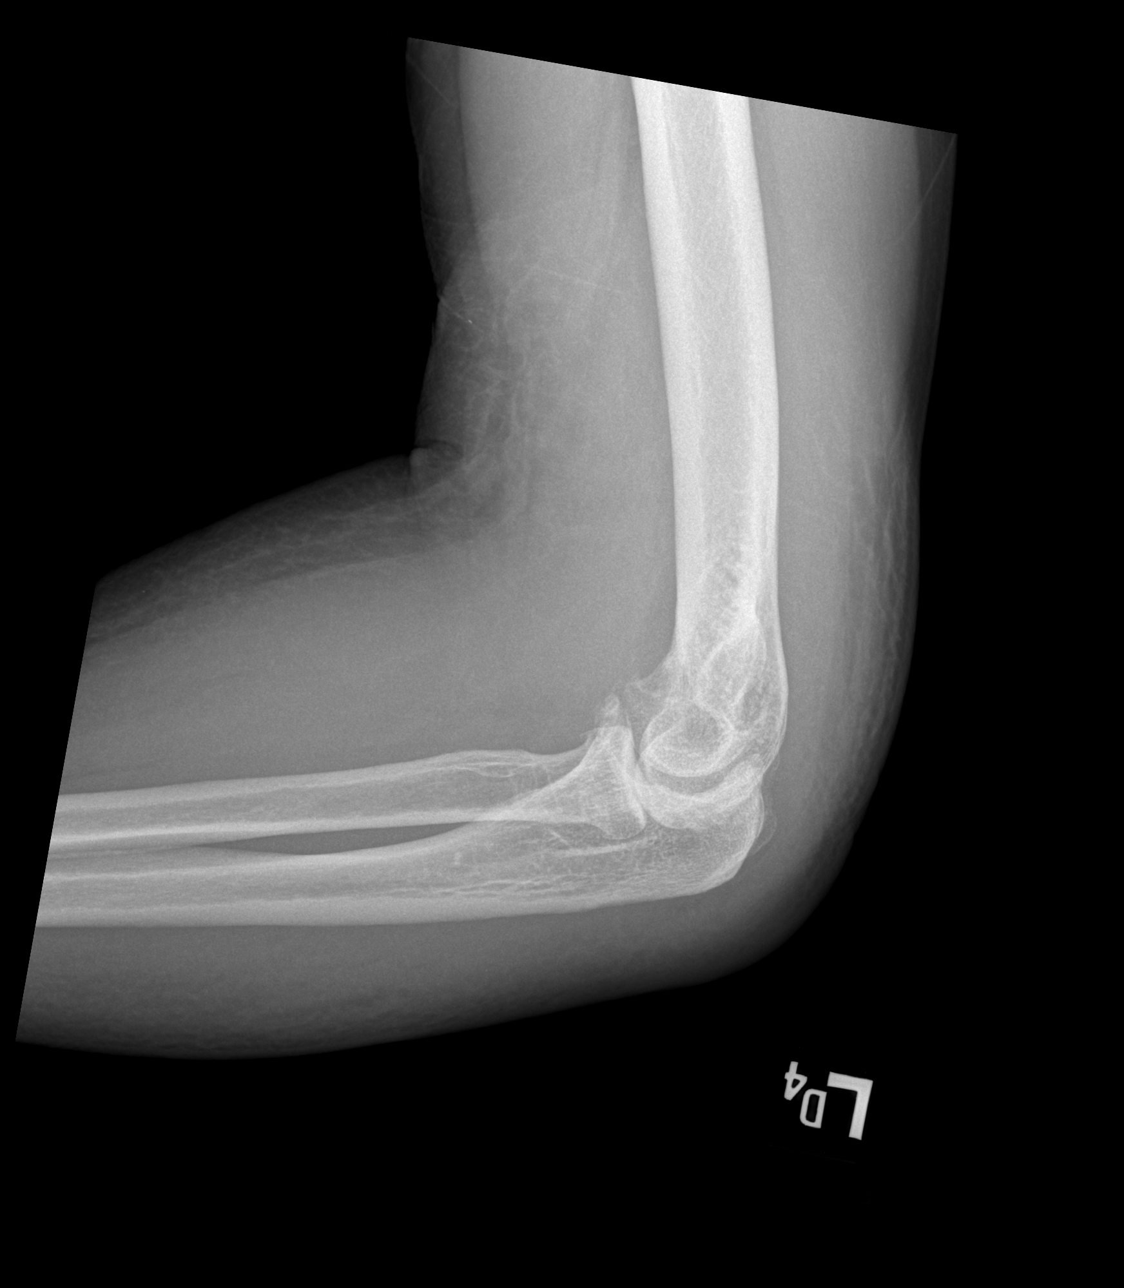

[4 of 4 positions shown; findings below may reference images not displayed]

FINDINGS: No evidence of fracture of the ulna or humerus. The radial head is
normal. No joint effusion.

No soft tissue abnormality evident
IMPRESSION: No acute osseous abnormality.

## 2023-04-02 IMAGING — MR MR FOREARM*L* W/O CM
4 of 8 series · 19 of 40 positions shown · non-contrast
Comparison: None.

CLINICAL DATA: Arm pain, fever

EXAM:
MRI OF THE LEFT FOREARM WITHOUT CONTRAST
TECHNIQUE: Multiplanar, multisequence MR imaging of the left was performed. No
intravenous contrast was administered.

[Series 18: T1 · oblique · left · 4.0mm · 0.59mm/px · 5 of 28 slices shown (1 of 3)]
[im 1/28]
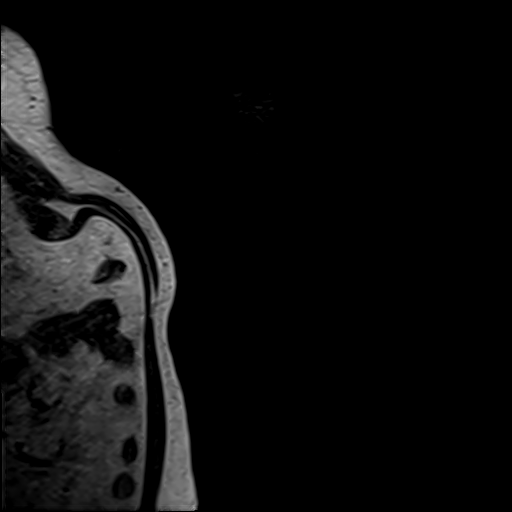
[im 7/28]
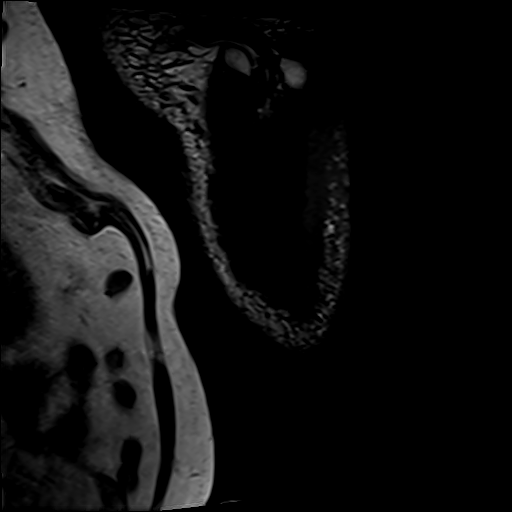
[im 14/28]
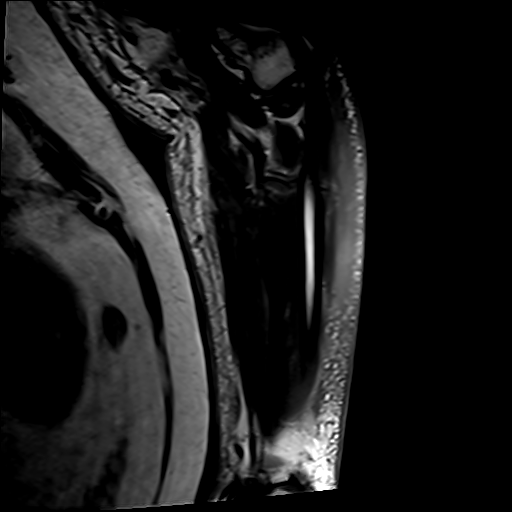
[im 21/28]
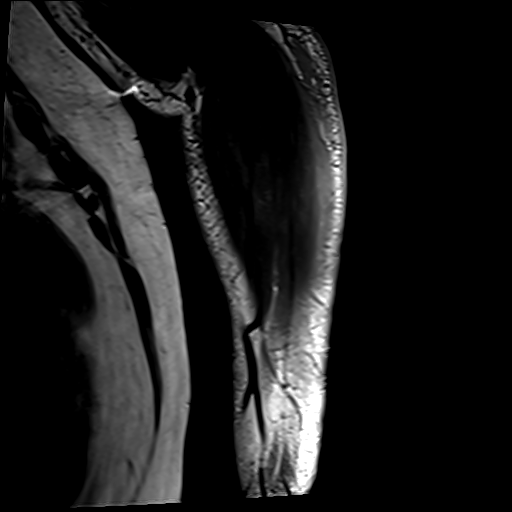
[im 28/28]
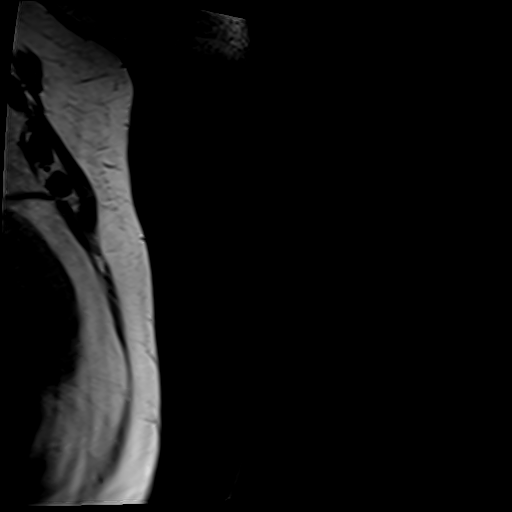

[Series 21: T1 · axial · left · 5.0mm · 0.31mm/px · z∈[-61,+125]mm · 6 of 46 slices shown (2 of 3)]
[im 1/46]
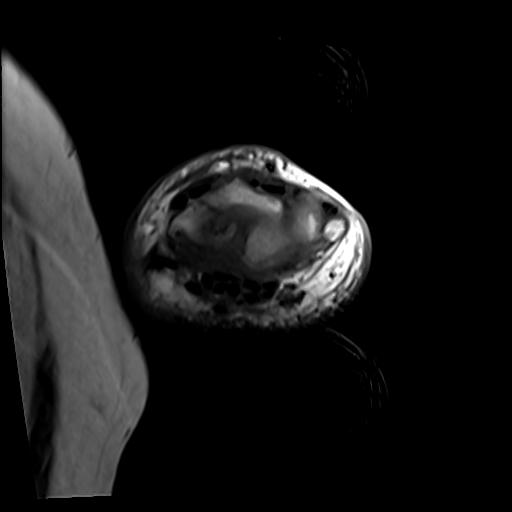
[im 8/46]
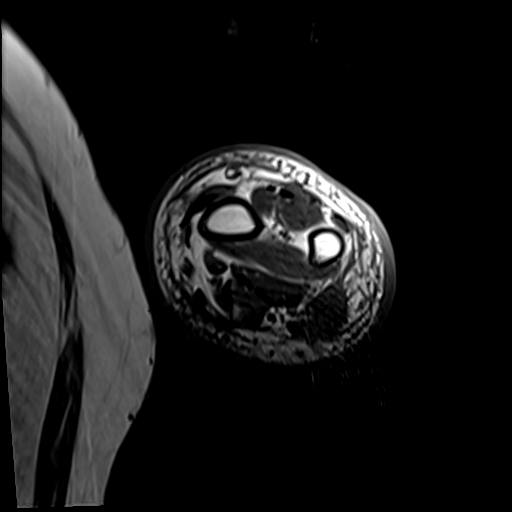
[im 16/46]
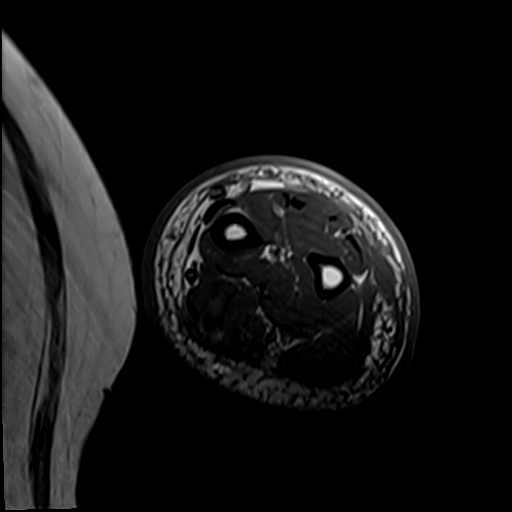
[im 23/46]
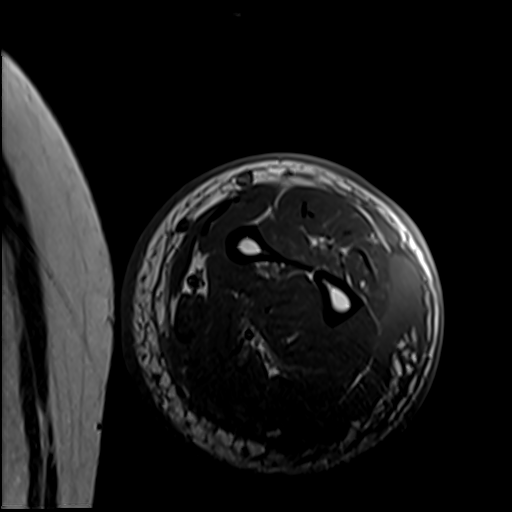
[im 31/46]
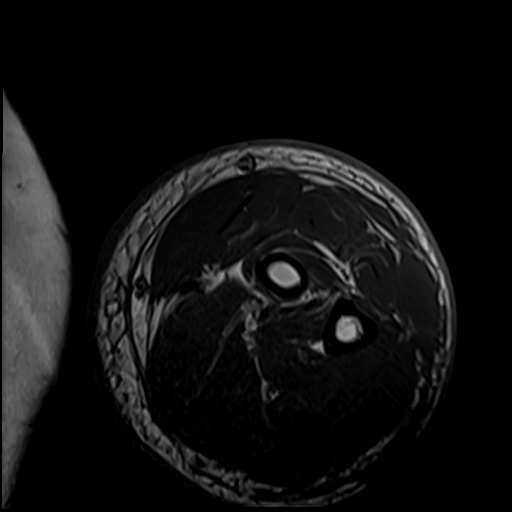
[im 38/46]
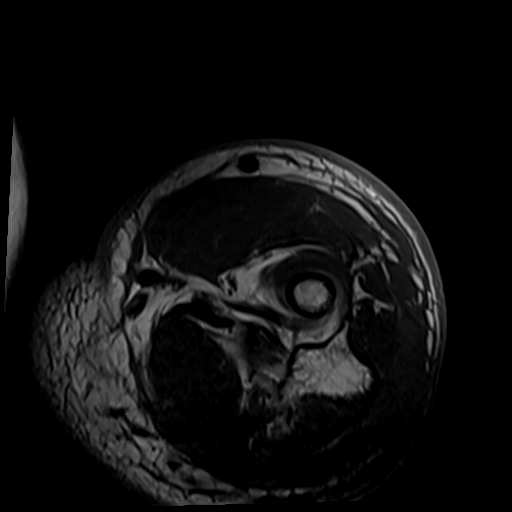

[Series 23: T1 · sagittal · left · 4.0mm · 0.59mm/px · 3 of 25 slices shown (3 of 3)]
[im 1/25]
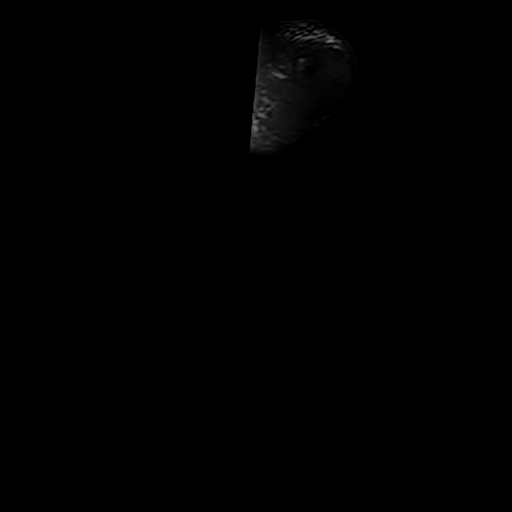
[im 17/25]
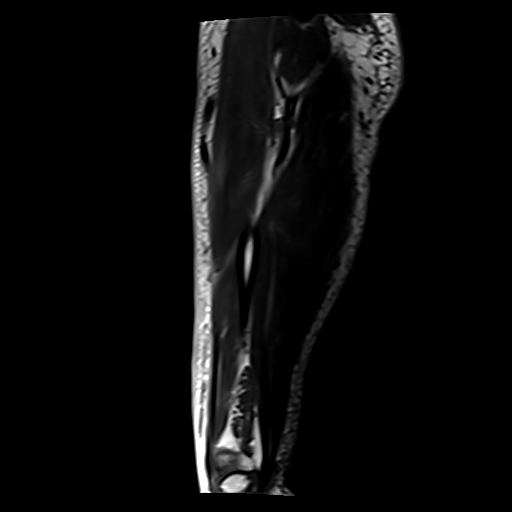
[im 25/25]
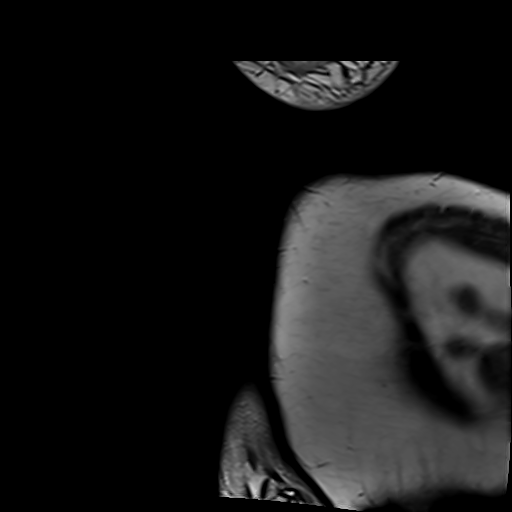

[Series 26: T2 fat-sat · oblique · left · 3.0mm · 0.27mm/px · 5 of 30 slices shown]
[im 1/30]
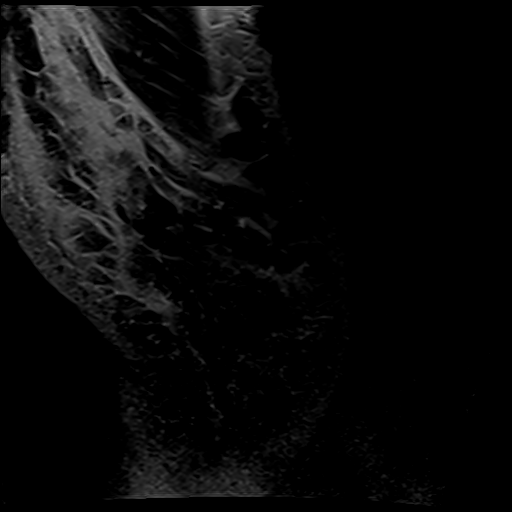
[im 8/30]
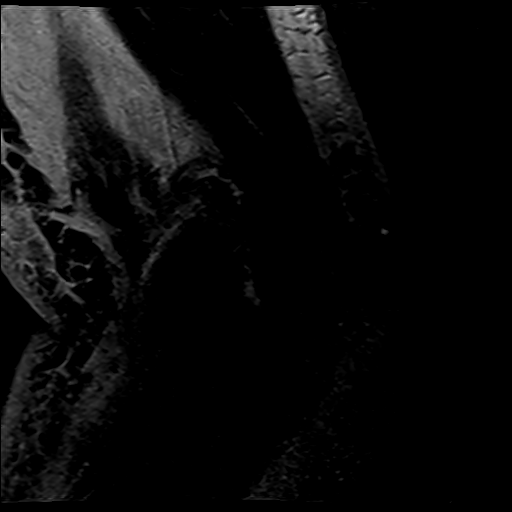
[im 15/30]
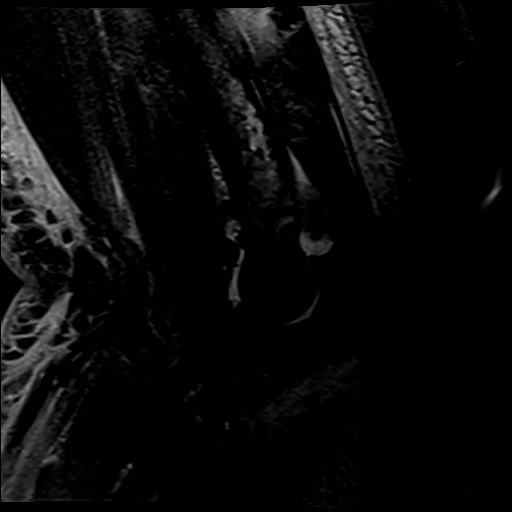
[im 22/30]
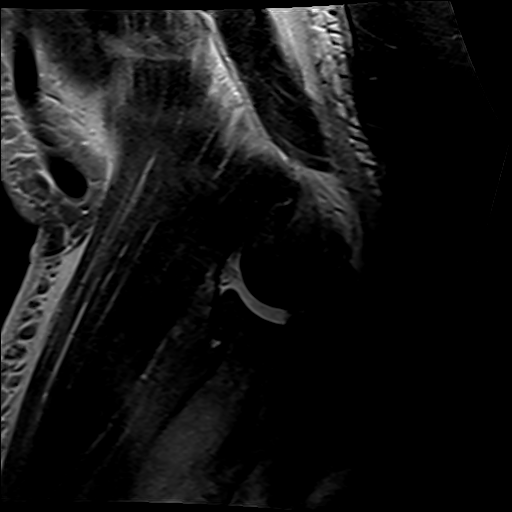
[im 30/30]
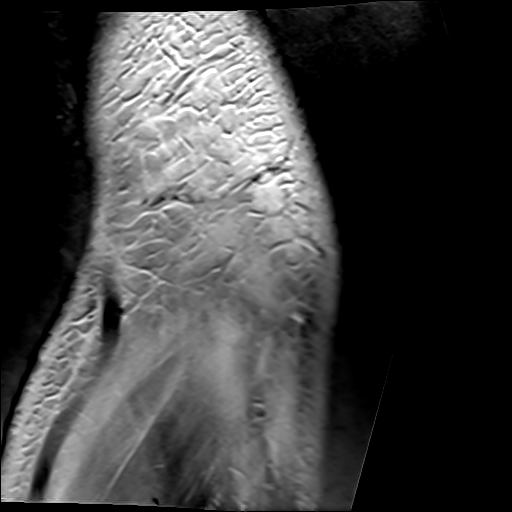

[19 of 40 positions shown; findings below may reference images not displayed]

FINDINGS: Bones/Joint/Cartilage

No fracture or dislocation. No areas cortical destruction periosteal
reaction is seen. Small elbow joint effusion is present.

Ligaments

Suboptimally visualized

Muscles and Tendons

Mildly increased feathery signal seen within the muscles of the
forearm, most notable seen posteriorly. No loculated fluid
collections however are seen. The flexor and extensor tendons
intact.

Soft tissues

Extensive diffuse subcutaneous edema seen surrounding the forearm.
There is non loculated fluid seen within the fascial layers
extending into the deep fascial layers. This subcutaneous emphysema
however is noted.
IMPRESSION: Findings of extensive cellulitis with non loculated fluid within the
subcutaneous soft tissues which could be phlegmon/early abscess. No
definite evidence of osteomyelitis

Diffuse mild muscular edema/myositis most notable posteriorly.

## 2023-04-04 DIAGNOSIS — R1312 Dysphagia, oropharyngeal phase: Secondary | ICD-10-CM | POA: Diagnosis not present

## 2023-04-04 DIAGNOSIS — K148 Other diseases of tongue: Secondary | ICD-10-CM | POA: Diagnosis not present

## 2023-04-04 DIAGNOSIS — C109 Malignant neoplasm of oropharynx, unspecified: Secondary | ICD-10-CM | POA: Diagnosis not present

## 2023-04-04 DIAGNOSIS — D490 Neoplasm of unspecified behavior of digestive system: Secondary | ICD-10-CM | POA: Diagnosis not present

## 2023-04-04 DIAGNOSIS — Z5111 Encounter for antineoplastic chemotherapy: Secondary | ICD-10-CM | POA: Diagnosis not present

## 2023-04-05 ENCOUNTER — Encounter (HOSPITAL_COMMUNITY): Payer: Self-pay

## 2023-04-05 ENCOUNTER — Ambulatory Visit (HOSPITAL_COMMUNITY)
Admission: RE | Admit: 2023-04-05 | Discharge: 2023-04-05 | Disposition: A | Discharge status: Home / Self Care | Payer: Medicare Other | Source: Ambulatory Visit | Attending: Otolaryngology | Admitting: Otolaryngology

## 2023-04-05 DIAGNOSIS — K118 Other diseases of salivary glands: Secondary | ICD-10-CM | POA: Diagnosis not present

## 2023-04-05 DIAGNOSIS — D490 Neoplasm of unspecified behavior of digestive system: Secondary | ICD-10-CM | POA: Diagnosis not present

## 2023-04-05 DIAGNOSIS — F17210 Nicotine dependence, cigarettes, uncomplicated: Secondary | ICD-10-CM | POA: Insufficient documentation

## 2023-04-05 DIAGNOSIS — R1310 Dysphagia, unspecified: Secondary | ICD-10-CM | POA: Insufficient documentation

## 2023-04-05 DIAGNOSIS — D110 Benign neoplasm of parotid gland: Secondary | ICD-10-CM | POA: Diagnosis not present

## 2023-04-05 LAB — NO BLOOD PRODUCTS

## 2023-04-05 MED ORDER — MIDAZOLAM HCL 2 MG/2ML IJ SOLN
INTRAMUSCULAR | Status: AC
  Filled 2023-04-05: qty 2

## 2023-04-05 MED ORDER — SODIUM CHLORIDE 0.9 % IV SOLN: INTRAVENOUS | Status: DC

## 2023-04-05 MED ORDER — MIDAZOLAM HCL 2 MG/2ML IJ SOLN
INTRAMUSCULAR | Status: AC | PRN
  Administered 2023-04-05: .5 mg via INTRAVENOUS

## 2023-04-05 MED ORDER — FENTANYL CITRATE (PF) 100 MCG/2ML IJ SOLN
INTRAMUSCULAR | Status: AC | PRN
  Administered 2023-04-05: 25 ug via INTRAVENOUS

## 2023-04-05 MED ORDER — LIDOCAINE HCL (PF) 1 % IJ SOLN
5.0000 mL | Freq: Once | INTRAMUSCULAR | Status: AC
  Administered 2023-04-05: 5 mL via INTRADERMAL

## 2023-04-05 MED ORDER — FENTANYL CITRATE (PF) 100 MCG/2ML IJ SOLN
INTRAMUSCULAR | Status: AC
  Filled 2023-04-05: qty 2

## 2023-04-05 NOTE — H&P (Signed)
Chief Complaint: Patient was seen in consultation today for parotid gland biopsy at the request of Stephen Holland  Referring Physician(s): Stephen Holland  Supervising Physician: Holland, Stephen Reading  Patient Status: [facility] - Out-pt  History of Present Illness: Stephen Holland is a 72 y.o. 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 TONGUE, BASE, BIOPSY:  Squamous mucosa with lymphoid tissue consistent with lingual tonsillar  tissue.  Mild associated squamous atypia, favor reactive.  Negative for invasive carcinoma.   IMPRESSION: 1. 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 Recommend direct visualization. 2. 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 Recommend ENT consultation. 3. No suspicious cervical lymphadenopathy.  Now scheduled in IR for parotid biopsy  DNR status per pt and wife in room  Past Medical History:  Diagnosis Date   AKI (acute kidney injury) (HCC) 03/01/2021   CKD (chronic kidney disease) stage 3, GFR 30-59 ml/min (HCC) 02/02/2023   stage 3a - GFR 48   Colon polyps    DJD (degenerative joint disease)    Dyspnea    with exertion   ED (erectile dysfunction)    GERD (gastroesophageal reflux disease)    Gout    Hyperlipemia    Hypertension    Obstructive sleep apnea    08/2012, cpap setting of 14   Prostate cancer (HCC) 01/2002   s/p prostatectomy   Vitamin D deficiency     Past Surgical History:  Procedure Laterality Date   CATARACT EXTRACTION, BILATERAL Bilateral 12/2020   Dr. Darel Holland   COLONOSCOPY WITH PROPOFOL N/A 09/06/2016   Procedure: COLONOSCOPY WITH PROPOFOL;  Surgeon: Stephen Bumpers, MD;  Location: [facility];  Service: Endoscopy;  Laterality: N/A;   colonscopy     x 3 with  polyp one time   DIRECT LARYNGOSCOPY N/A 03/24/2023   Procedure: DIRECT LARYNGOSCOPY WITH BIOPSIES;  Surgeon: Stephen Ar, MD;  Location: [facility] OR;  Service: ENT;  Laterality: N/A;   PROSTATECTOMY  2003   SKIN CANCER EXCISION      Allergies: Allopurinol  Medications: Prior to Admission medications   Medication Sig Start Date End Date Taking? Authorizing Provider  acetaminophen (TYLENOL) 650 MG CR tablet Take 650 mg by mouth every 8 (eight) hours as needed for pain.   Yes [provider]  bisoprolol (ZEBETA) 10 MG tablet TAKE 1 TABLET BY MOUTH EVERY DAY FOR BLOOD PRESSURE 02/04/23  Yes Raynelle Dick, NP  Cholecalciferol (VITAMIN D3) 125 MCG (5000 UT) CAPS Take 10,000 Units by mouth See admin instructions. Take tues., thurs., Sat., Sun.   Yes [provider]  clonazePAM (KLONOPIN) 1 MG tablet TAKE 1/2 TO 1 TABLETS BY MOUTH AT BEDTIME AS NEEDED (RESTLESS LEG SYNDROME). Patient taking differently: Take 1 mg by mouth 4 (four) times a week. 01/12/23  Yes Raynelle Dick, NP  gabapentin (NEURONTIN) 600 MG tablet TAKE 1/2 TO 1 TABLET 2 TO 3 TIMES DAILY AS NEEDED FOR NEUROPATHY PAIN Patient taking differently: Take 600 mg by mouth 2 (two) times daily. 06/26/22  Yes Raynelle Dick, NP  HYDROcodone-acetaminophen (NORCO/VICODIN) 5-325 MG tablet Take 1 tablet by mouth every 8 (eight) hours as needed for moderate pain (throat pain; ear pain). 03/07/23  Yes Holland, Stephen R, MD  KRILL OIL PO Take 750 mg by mouth every  morning. Mega Red   Yes [provider]  meloxicam (MOBIC) 15 MG tablet TAKE 1 TABLET BY MOUTH DAILY AS NEEDED WITH FOOD FOR PAIN & INFLAMMATION Patient taking differently: Take 15 mg by mouth in the morning. 01/26/23  Yes Holland, Tonya, NP  Multiple Vitamins-Minerals (MEGA 2000 PO) Take by mouth.   Yes [provider]  pantoprazole (PROTONIX) 40 MG tablet TAKE 1 TABLET BY MOUTH EVERY DAY 12/24/22  Yes Raynelle Dick, NP  rosuvastatin (CRESTOR) 40  MG tablet TAKE 1 TABLET BY MOUTH EVERY DAY FOR CHOLESTEROL 02/25/22  Yes Stephen Gaudier, NP  telmisartan (MICARDIS) 80 MG tablet TAKE 1 TABLET BY MOUTH EVERY DAY FOR BLOOD PRESSURE Patient taking differently: Take 80 mg by mouth every evening. FOR BLOOD PRESSURE 01/12/23  Yes Raynelle Dick, NP  fluconazole (DIFLUCAN) 100 MG tablet Take one tablet by mouth on Monday and Thursday during radiation. 03/17/23   [provider]  hyoscyamine (LEVSIN) 0.125 MG tablet Take 1 tablet (0.125 mg total) by mouth every 4 (four) hours as needed for cramping (diarrhea, nausea). Patient not taking: Reported on 03/22/2023 01/24/23   Raynelle Dick, NP  triamcinolone ointment (KENALOG) 0.1 % APPLY A THIN LAYER TO AFFECTED AREA TWICE DAILY BUT NOT WITHIN 2 HOURS PRIOR TO RADIATION THERAPY 03/17/23   [provider]     Family History  Problem Relation Age of Onset   Hypertension Mother    Pulmonary fibrosis Mother    Stroke Father    Heart attack Maternal Grandmother    Stroke Maternal Grandfather    Stroke Paternal Grandmother    Colon cancer Paternal Grandfather     Social History   Socioeconomic History   Marital status: Married    Spouse name: Stephen Holland   Number of children: 2   Years of education: 12   Highest education level: Not on file  Occupational History    Comment: truck driver  Tobacco Use   Smoking status: Every Day    Packs/day: 1.00    Years: 55.00    Additional pack years: 0.00    Total pack years: 55.00    Types: Cigarettes   Smokeless tobacco: Never  Vaping Use   Vaping Use: Never used  Substance and Sexual Activity   Alcohol use: Not Currently    Alcohol/week: 7.0 - 14.0 standard drinks of alcohol    Types: 7 - 14 Shots of liquor per week    Comment: 03/23/23:  1-2 mixed drinks per day   Drug use: No   Sexual activity: Not on file  Other Topics Concern   Not on file  Social History Narrative   Lives home with wife. Retired.  Education HS.  Children 2      Caffeine 6 cups coffee daily, 2-3 glasses of tea and one coke per day   Social Determinants of Health   Financial Resource Strain: Not on file  Food Insecurity: No Food Insecurity (02/04/2022)   Hunger Vital Sign    Worried About Running Out of Food in the Last Year: Never true    Ran Out of Food in the Last Year: Never true  Transportation Needs: No Transportation Needs (11/10/2021)   PRAPARE - Administrator, Civil Service (Medical): No    Lack of Transportation (Non-Medical): No  Physical Activity: Not on file  Stress: Not on file  Social Connections: Not on file    Review of Systems: A 12 point ROS discussed and pertinent positives are  indicated in the HPI above.  All other systems are negative.  Review of Systems  Constitutional:  Negative for activity change, fatigue and fever.  HENT:  Positive for trouble swallowing.   Respiratory:  Negative for cough and shortness of breath.   Cardiovascular:  Negative for chest pain.  Gastrointestinal:  Negative for abdominal pain, nausea and vomiting.  Psychiatric/Behavioral:  Negative for behavioral problems and confusion.     Vital Signs: BP (!) 169/78   Pulse 60   Temp 97.9 F (36.6 C) (Temporal)   Resp 18   Ht 5\' 7"  (1.702 m)   Wt 166 lb (75.3 kg)   SpO2 98%   BMI 26.00 kg/m     Physical Exam Vitals reviewed.  HENT:     Mouth/Throat:     Mouth: Mucous membranes are moist.  Cardiovascular:     Rate and Rhythm: Normal rate and regular rhythm.     Heart sounds: Normal heart sounds.  Pulmonary:     Effort: Pulmonary effort is normal.     Breath sounds: Normal breath sounds.  Abdominal:     Palpations: Abdomen is soft.  Musculoskeletal:        General: Normal range of motion.  Skin:    General: Skin is warm.  Neurological:     Mental Status: He is alert and oriented to person, place, and time.  Psychiatric:        Behavior: Behavior normal.     Imaging: No results found.  Labs:  CBC: Recent  Labs    06/30/22 1014 10/14/22 0000 02/02/23 1101 03/24/23 1250  WBC 7.6 7.8 7.7 11.1*  HGB 15.2 16.7 15.2 15.4  HCT 45.1 48.5 44.4 46.7  PLT 182 159 172 172    COAGS: No results for input(s): "INR", "APTT" in the last 8760 hours.  BMP: Recent Labs    06/30/22 1014 10/14/22 0000 02/02/23 1101 03/24/23 1250  NA 139 141 138 139  K 5.1 5.3 4.8 4.1  CL 106 105 105 104  CO2 26 27 25 24   GLUCOSE 85 82 90 94  BUN 30* 30* 27* 27*  CALCIUM 10.0 9.8 10.0 9.3  CREATININE 1.53* 1.20 1.53* 1.36*  GFRNONAA  --   --   --  55*    LIVER FUNCTION TESTS: Recent Labs    06/30/22 1014 10/14/22 0000 02/02/23 1101  BILITOT 0.5 0.6 0.8  AST 19 23 23   ALT 17 20 23   PROT 7.3 7.2 7.0    TUMOR MARKERS: No results for input(s): "AFPTM", "CEA", "CA199", "CHROMGRNA" in the last 8760 hours.  Assessment and Plan:  ++smoker Base of tongue lesion biopsy 4/25: negative Imaging revealing parotid mass Scheduled now for biopsy today Risks and benefits of parotid biopsy was discussed with the patient and/or patient's family including, but not limited to bleeding, infection, damage to adjacent structures or low yield requiring additional tests.  All of the questions were answered and there is agreement to proceed.  Consent signed and in chart.   Thank you for this interesting consult.  I greatly enjoyed meeting MOHID SYDOW and look forward to participating in their care.  A copy of this report was sent to the requesting provider on this date.  Electronically Signed: Robet Leu, PA-C 04/05/2023, 11:57 AM   I spent a total of  30 Minutes   in face to face in clinical consultation, greater than 50% of which was counseling/coordinating care for parotid biopsy

## 2023-04-05 NOTE — Procedures (Signed)
Interventional Radiology Procedure Note  Procedure: US guided right intraparotid mass biopsy  Indication: Right parotid mass  Findings: Please refer to procedural dictation for full description.  Complications: None  EBL: < 10 mL  Acquanetta Belling, MD [phone]

## 2023-04-06 DIAGNOSIS — G4733 Obstructive sleep apnea (adult) (pediatric): Secondary | ICD-10-CM | POA: Diagnosis not present

## 2023-04-06 DIAGNOSIS — J4489 Other specified chronic obstructive pulmonary disease: Secondary | ICD-10-CM | POA: Diagnosis not present

## 2023-04-06 DIAGNOSIS — I129 Hypertensive chronic kidney disease with stage 1 through stage 4 chronic kidney disease, or unspecified chronic kidney disease: Secondary | ICD-10-CM | POA: Diagnosis not present

## 2023-04-06 DIAGNOSIS — N183 Chronic kidney disease, stage 3 unspecified: Secondary | ICD-10-CM | POA: Diagnosis not present

## 2023-04-06 LAB — SURGICAL PATHOLOGY

## 2023-04-07 ENCOUNTER — Other Ambulatory Visit: Payer: Medicare Other

## 2023-04-07 ENCOUNTER — Ambulatory Visit: Payer: Medicare Other | Admitting: Hematology and Oncology

## 2023-04-08 DIAGNOSIS — I10 Essential (primary) hypertension: Secondary | ICD-10-CM | POA: Diagnosis not present

## 2023-04-08 DIAGNOSIS — Z79899 Other long term (current) drug therapy: Secondary | ICD-10-CM | POA: Diagnosis not present

## 2023-04-08 DIAGNOSIS — G4733 Obstructive sleep apnea (adult) (pediatric): Secondary | ICD-10-CM | POA: Diagnosis not present

## 2023-04-08 DIAGNOSIS — C109 Malignant neoplasm of oropharynx, unspecified: Secondary | ICD-10-CM | POA: Diagnosis not present

## 2023-04-08 DIAGNOSIS — F17210 Nicotine dependence, cigarettes, uncomplicated: Secondary | ICD-10-CM | POA: Diagnosis not present

## 2023-04-08 DIAGNOSIS — E785 Hyperlipidemia, unspecified: Secondary | ICD-10-CM | POA: Diagnosis not present

## 2023-04-08 DIAGNOSIS — J449 Chronic obstructive pulmonary disease, unspecified: Secondary | ICD-10-CM | POA: Diagnosis not present

## 2023-04-08 DIAGNOSIS — K148 Other diseases of tongue: Secondary | ICD-10-CM | POA: Diagnosis not present

## 2023-04-12 DIAGNOSIS — C109 Malignant neoplasm of oropharynx, unspecified: Secondary | ICD-10-CM | POA: Diagnosis not present

## 2023-04-12 DIAGNOSIS — D490 Neoplasm of unspecified behavior of digestive system: Secondary | ICD-10-CM | POA: Diagnosis not present

## 2023-04-13 DIAGNOSIS — C109 Malignant neoplasm of oropharynx, unspecified: Secondary | ICD-10-CM | POA: Diagnosis not present

## 2023-04-13 DIAGNOSIS — Z452 Encounter for adjustment and management of vascular access device: Secondary | ICD-10-CM | POA: Diagnosis not present

## 2023-04-13 DIAGNOSIS — F17210 Nicotine dependence, cigarettes, uncomplicated: Secondary | ICD-10-CM | POA: Diagnosis not present

## 2023-04-15 DIAGNOSIS — C109 Malignant neoplasm of oropharynx, unspecified: Secondary | ICD-10-CM | POA: Diagnosis not present

## 2023-04-15 DIAGNOSIS — C3490 Malignant neoplasm of unspecified part of unspecified bronchus or lung: Secondary | ICD-10-CM | POA: Diagnosis not present

## 2023-04-15 DIAGNOSIS — Z5111 Encounter for antineoplastic chemotherapy: Secondary | ICD-10-CM | POA: Diagnosis not present

## 2023-04-15 DIAGNOSIS — Z7982 Long term (current) use of aspirin: Secondary | ICD-10-CM | POA: Diagnosis not present

## 2023-04-15 DIAGNOSIS — Z955 Presence of coronary angioplasty implant and graft: Secondary | ICD-10-CM | POA: Diagnosis not present

## 2023-04-15 DIAGNOSIS — Z79899 Other long term (current) drug therapy: Secondary | ICD-10-CM | POA: Diagnosis not present

## 2023-04-15 DIAGNOSIS — I2510 Atherosclerotic heart disease of native coronary artery without angina pectoris: Secondary | ICD-10-CM | POA: Diagnosis not present

## 2023-04-15 DIAGNOSIS — E785 Hyperlipidemia, unspecified: Secondary | ICD-10-CM | POA: Diagnosis not present

## 2023-04-15 DIAGNOSIS — F17210 Nicotine dependence, cigarettes, uncomplicated: Secondary | ICD-10-CM | POA: Diagnosis not present

## 2023-04-15 DIAGNOSIS — C3411 Malignant neoplasm of upper lobe, right bronchus or lung: Secondary | ICD-10-CM | POA: Diagnosis not present

## 2023-04-15 DIAGNOSIS — C7A1 Malignant poorly differentiated neuroendocrine tumors: Secondary | ICD-10-CM | POA: Diagnosis not present

## 2023-04-15 DIAGNOSIS — Z7902 Long term (current) use of antithrombotics/antiplatelets: Secondary | ICD-10-CM | POA: Diagnosis not present

## 2023-04-15 DIAGNOSIS — I10 Essential (primary) hypertension: Secondary | ICD-10-CM | POA: Diagnosis not present

## 2023-04-15 DIAGNOSIS — J449 Chronic obstructive pulmonary disease, unspecified: Secondary | ICD-10-CM | POA: Diagnosis not present

## 2023-04-15 DIAGNOSIS — Z9049 Acquired absence of other specified parts of digestive tract: Secondary | ICD-10-CM | POA: Diagnosis not present

## 2023-04-20 ENCOUNTER — Telehealth: Payer: Self-pay | Admitting: *Deleted

## 2023-04-20 NOTE — Telephone Encounter (Signed)
General Review (HC) Chart Review Have there been any documented new, changed, or discontinued medications since last visit?  (Include name, dose, frequency, date):  02/26/2024[name] (PCP)- started Hyoscyamine sulfate 0.125mg  q4hr prn. 03/06/2024[name] (PCP)- started dexamethasone 4mg  1 tab TID for 5 days then BID for 5 days then once a day for 5 days. 03/07/2023- Stephen Holland (Neurology)- started hydrocodone-acetaminophen 5-325mg  q8hr prn.  Has there been any documented recent hospitalizations or ED visits since last visit with Clinical Lead?: Yes Brief Summary (including Medication and/or Diagnosis changes):: [name] visit for scheduled laryngoscopy with biopsies.  Adherence Review Does the [name] have access to medication refill data?: Yes Adherence rates for STAR metric medications: Telmisartan 80mg - 02/25/2023 90DS, 11/29/2022 90DS Rosuvastatin 40mg - 01/26/2023 90DS, 10/20/2022 90DS Adherence rates for medications indicated for disease state being reviewed: n/a Does the patient have >5 day gap between last estimated fill dates for any of the above medications or other medication gaps?: No  Disease State Questions Able to connect with the Patient?: Yes Did patient have any problems with their health recently?: Yes Note Problems and Concerns:: Patient has been having trouble swallowing and throat pain for 4-5 months. Patient has been diagnosed with oropharynx cancer. Have you had any admissions or emergency room visits or worsening of your condition(s) since last visit?: Yes Details of ED visit, hospital visit and/or worsening condition(s):: Hospital visits but only for scheduled laryngoscopy with biopsies twice. Have you had any visits with new specialists or providers since your last visit?: Yes Explain:: ENT and Oncology for new diagnosis of oropharynx cancer. Have you had any new health care problem(s) since your last visit?: Yes New problem(s) reported:: Oropharynx  cancer Have you run out of any of your medications since you last spoke with your clinical lead?: No Are there any medications you are not taking as prescribed?: No Are you having any issues or side effects with your medications?: No Do you have any other health concerns or questions you want to discuss with your Clinical lead  before your next visit?: No Are there any health concerns that you feel we can do a better job addressing?: No Are you having any problems with any of the following since the last visit: (select all that apply): Eating Details: Trouble eating due to newly diagnosed cancer, trouble swallowing and having 2 biopsies at back of tongue. 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 Patient has had 2 biopsies and another scheduled next week. Patient was to start radiation treatments this past Monday but it was postponed until 3rd biopsy is performed. Patient states that his smoking isn't going too well and that he is still smoking, however, patient states that once radiation starts that he can't smoke anymore. Advised patient to reach out if he has any concerns, issues, or needs. Patient is scheduled for [facility] 07/22/2023.  Engagement Notes Stephen Holland on 04/20/2023 02:20 PM Reviewing pts chart prior to general review call. Reviewing office visits, consults, hospital visits, lab and medication adherence/changes. Spoke with patient and completed review call. ( )  Clinical Lead Review Review Adherence gaps identified?: No Drug Therapy Problems identified?: No Assessment: New needs identified Plan: HC to f/u with patient in 1 month on 3rd biopsy and if started radiation  tx CRN reviewed  details of call: (6 min)

## 2023-04-26 DIAGNOSIS — C109 Malignant neoplasm of oropharynx, unspecified: Secondary | ICD-10-CM | POA: Diagnosis not present

## 2023-04-26 DIAGNOSIS — C01 Malignant neoplasm of base of tongue: Secondary | ICD-10-CM | POA: Diagnosis not present

## 2023-05-02 DIAGNOSIS — N1831 Chronic kidney disease, stage 3a: Secondary | ICD-10-CM | POA: Diagnosis not present

## 2023-05-02 DIAGNOSIS — Z5111 Encounter for antineoplastic chemotherapy: Secondary | ICD-10-CM | POA: Diagnosis not present

## 2023-05-02 DIAGNOSIS — Z510 Encounter for antineoplastic radiation therapy: Secondary | ICD-10-CM | POA: Diagnosis not present

## 2023-05-02 DIAGNOSIS — D490 Neoplasm of unspecified behavior of digestive system: Secondary | ICD-10-CM | POA: Diagnosis not present

## 2023-05-02 DIAGNOSIS — C109 Malignant neoplasm of oropharynx, unspecified: Secondary | ICD-10-CM | POA: Diagnosis not present

## 2023-05-02 DIAGNOSIS — Z95828 Presence of other vascular implants and grafts: Secondary | ICD-10-CM | POA: Diagnosis not present

## 2023-05-02 DIAGNOSIS — C108 Malignant neoplasm of overlapping sites of oropharynx: Secondary | ICD-10-CM | POA: Diagnosis not present

## 2023-05-06 DIAGNOSIS — Z95828 Presence of other vascular implants and grafts: Secondary | ICD-10-CM | POA: Diagnosis not present

## 2023-05-06 DIAGNOSIS — Z5111 Encounter for antineoplastic chemotherapy: Secondary | ICD-10-CM | POA: Diagnosis not present

## 2023-05-06 DIAGNOSIS — C109 Malignant neoplasm of oropharynx, unspecified: Secondary | ICD-10-CM | POA: Diagnosis not present

## 2023-05-06 DIAGNOSIS — D490 Neoplasm of unspecified behavior of digestive system: Secondary | ICD-10-CM | POA: Diagnosis not present

## 2023-05-09 DIAGNOSIS — Z5111 Encounter for antineoplastic chemotherapy: Secondary | ICD-10-CM | POA: Diagnosis not present

## 2023-05-09 DIAGNOSIS — C109 Malignant neoplasm of oropharynx, unspecified: Secondary | ICD-10-CM | POA: Diagnosis not present

## 2023-05-09 DIAGNOSIS — Z95828 Presence of other vascular implants and grafts: Secondary | ICD-10-CM | POA: Diagnosis not present

## 2023-05-09 DIAGNOSIS — Z510 Encounter for antineoplastic radiation therapy: Secondary | ICD-10-CM | POA: Diagnosis not present

## 2023-05-09 DIAGNOSIS — C108 Malignant neoplasm of overlapping sites of oropharynx: Secondary | ICD-10-CM | POA: Diagnosis not present

## 2023-05-09 DIAGNOSIS — N1831 Chronic kidney disease, stage 3a: Secondary | ICD-10-CM | POA: Diagnosis not present

## 2023-05-09 DIAGNOSIS — R1312 Dysphagia, oropharyngeal phase: Secondary | ICD-10-CM | POA: Diagnosis not present

## 2023-05-09 DIAGNOSIS — D490 Neoplasm of unspecified behavior of digestive system: Secondary | ICD-10-CM | POA: Diagnosis not present

## 2023-05-10 DIAGNOSIS — D490 Neoplasm of unspecified behavior of digestive system: Secondary | ICD-10-CM | POA: Diagnosis not present

## 2023-05-10 DIAGNOSIS — C109 Malignant neoplasm of oropharynx, unspecified: Secondary | ICD-10-CM | POA: Diagnosis not present

## 2023-05-10 DIAGNOSIS — Z95828 Presence of other vascular implants and grafts: Secondary | ICD-10-CM | POA: Diagnosis not present

## 2023-05-10 DIAGNOSIS — N1831 Chronic kidney disease, stage 3a: Secondary | ICD-10-CM | POA: Diagnosis not present

## 2023-05-10 DIAGNOSIS — Z510 Encounter for antineoplastic radiation therapy: Secondary | ICD-10-CM | POA: Diagnosis not present

## 2023-05-10 DIAGNOSIS — C108 Malignant neoplasm of overlapping sites of oropharynx: Secondary | ICD-10-CM | POA: Diagnosis not present

## 2023-05-10 DIAGNOSIS — Z5111 Encounter for antineoplastic chemotherapy: Secondary | ICD-10-CM | POA: Diagnosis not present

## 2023-05-11 ENCOUNTER — Ambulatory Visit: Payer: Medicare Other | Admitting: Nurse Practitioner

## 2023-05-11 DIAGNOSIS — N1831 Chronic kidney disease, stage 3a: Secondary | ICD-10-CM | POA: Diagnosis not present

## 2023-05-11 DIAGNOSIS — D490 Neoplasm of unspecified behavior of digestive system: Secondary | ICD-10-CM | POA: Diagnosis not present

## 2023-05-11 DIAGNOSIS — Z510 Encounter for antineoplastic radiation therapy: Secondary | ICD-10-CM | POA: Diagnosis not present

## 2023-05-11 DIAGNOSIS — Z5111 Encounter for antineoplastic chemotherapy: Secondary | ICD-10-CM | POA: Diagnosis not present

## 2023-05-11 DIAGNOSIS — Z95828 Presence of other vascular implants and grafts: Secondary | ICD-10-CM | POA: Diagnosis not present

## 2023-05-11 DIAGNOSIS — C108 Malignant neoplasm of overlapping sites of oropharynx: Secondary | ICD-10-CM | POA: Diagnosis not present

## 2023-05-11 DIAGNOSIS — C109 Malignant neoplasm of oropharynx, unspecified: Secondary | ICD-10-CM | POA: Diagnosis not present

## 2023-05-12 DIAGNOSIS — Z5111 Encounter for antineoplastic chemotherapy: Secondary | ICD-10-CM | POA: Diagnosis not present

## 2023-05-12 DIAGNOSIS — Z510 Encounter for antineoplastic radiation therapy: Secondary | ICD-10-CM | POA: Diagnosis not present

## 2023-05-12 DIAGNOSIS — C108 Malignant neoplasm of overlapping sites of oropharynx: Secondary | ICD-10-CM | POA: Diagnosis not present

## 2023-05-12 DIAGNOSIS — C109 Malignant neoplasm of oropharynx, unspecified: Secondary | ICD-10-CM | POA: Diagnosis not present

## 2023-05-12 DIAGNOSIS — Z95828 Presence of other vascular implants and grafts: Secondary | ICD-10-CM | POA: Diagnosis not present

## 2023-05-12 DIAGNOSIS — N1831 Chronic kidney disease, stage 3a: Secondary | ICD-10-CM | POA: Diagnosis not present

## 2023-05-12 DIAGNOSIS — D490 Neoplasm of unspecified behavior of digestive system: Secondary | ICD-10-CM | POA: Diagnosis not present

## 2023-05-13 DIAGNOSIS — D490 Neoplasm of unspecified behavior of digestive system: Secondary | ICD-10-CM | POA: Diagnosis not present

## 2023-05-13 DIAGNOSIS — C108 Malignant neoplasm of overlapping sites of oropharynx: Secondary | ICD-10-CM | POA: Diagnosis not present

## 2023-05-13 DIAGNOSIS — N1831 Chronic kidney disease, stage 3a: Secondary | ICD-10-CM | POA: Diagnosis not present

## 2023-05-13 DIAGNOSIS — Z95828 Presence of other vascular implants and grafts: Secondary | ICD-10-CM | POA: Diagnosis not present

## 2023-05-13 DIAGNOSIS — Z5111 Encounter for antineoplastic chemotherapy: Secondary | ICD-10-CM | POA: Diagnosis not present

## 2023-05-13 DIAGNOSIS — Z510 Encounter for antineoplastic radiation therapy: Secondary | ICD-10-CM | POA: Diagnosis not present

## 2023-05-13 DIAGNOSIS — C109 Malignant neoplasm of oropharynx, unspecified: Secondary | ICD-10-CM | POA: Diagnosis not present

## 2023-05-16 DIAGNOSIS — Z5111 Encounter for antineoplastic chemotherapy: Secondary | ICD-10-CM | POA: Diagnosis not present

## 2023-05-16 DIAGNOSIS — C109 Malignant neoplasm of oropharynx, unspecified: Secondary | ICD-10-CM | POA: Diagnosis not present

## 2023-05-16 DIAGNOSIS — Z510 Encounter for antineoplastic radiation therapy: Secondary | ICD-10-CM | POA: Diagnosis not present

## 2023-05-16 DIAGNOSIS — Z95828 Presence of other vascular implants and grafts: Secondary | ICD-10-CM | POA: Diagnosis not present

## 2023-05-16 DIAGNOSIS — N1831 Chronic kidney disease, stage 3a: Secondary | ICD-10-CM | POA: Diagnosis not present

## 2023-05-16 DIAGNOSIS — R1312 Dysphagia, oropharyngeal phase: Secondary | ICD-10-CM | POA: Diagnosis not present

## 2023-05-16 DIAGNOSIS — C108 Malignant neoplasm of overlapping sites of oropharynx: Secondary | ICD-10-CM | POA: Diagnosis not present

## 2023-05-16 DIAGNOSIS — D490 Neoplasm of unspecified behavior of digestive system: Secondary | ICD-10-CM | POA: Diagnosis not present

## 2023-05-17 DIAGNOSIS — Z95828 Presence of other vascular implants and grafts: Secondary | ICD-10-CM | POA: Diagnosis not present

## 2023-05-17 DIAGNOSIS — C109 Malignant neoplasm of oropharynx, unspecified: Secondary | ICD-10-CM | POA: Diagnosis not present

## 2023-05-17 DIAGNOSIS — D490 Neoplasm of unspecified behavior of digestive system: Secondary | ICD-10-CM | POA: Diagnosis not present

## 2023-05-17 DIAGNOSIS — Z510 Encounter for antineoplastic radiation therapy: Secondary | ICD-10-CM | POA: Diagnosis not present

## 2023-05-17 DIAGNOSIS — Z5111 Encounter for antineoplastic chemotherapy: Secondary | ICD-10-CM | POA: Diagnosis not present

## 2023-05-17 DIAGNOSIS — N1831 Chronic kidney disease, stage 3a: Secondary | ICD-10-CM | POA: Diagnosis not present

## 2023-05-17 DIAGNOSIS — C108 Malignant neoplasm of overlapping sites of oropharynx: Secondary | ICD-10-CM | POA: Diagnosis not present

## 2023-05-18 DIAGNOSIS — C108 Malignant neoplasm of overlapping sites of oropharynx: Secondary | ICD-10-CM | POA: Diagnosis not present

## 2023-05-18 DIAGNOSIS — N1831 Chronic kidney disease, stage 3a: Secondary | ICD-10-CM | POA: Diagnosis not present

## 2023-05-18 DIAGNOSIS — C109 Malignant neoplasm of oropharynx, unspecified: Secondary | ICD-10-CM | POA: Diagnosis not present

## 2023-05-18 DIAGNOSIS — Z510 Encounter for antineoplastic radiation therapy: Secondary | ICD-10-CM | POA: Diagnosis not present

## 2023-05-18 DIAGNOSIS — D490 Neoplasm of unspecified behavior of digestive system: Secondary | ICD-10-CM | POA: Diagnosis not present

## 2023-05-18 DIAGNOSIS — Z95828 Presence of other vascular implants and grafts: Secondary | ICD-10-CM | POA: Diagnosis not present

## 2023-05-18 DIAGNOSIS — Z5111 Encounter for antineoplastic chemotherapy: Secondary | ICD-10-CM | POA: Diagnosis not present

## 2023-05-19 DIAGNOSIS — Z95828 Presence of other vascular implants and grafts: Secondary | ICD-10-CM | POA: Diagnosis not present

## 2023-05-19 DIAGNOSIS — Z5111 Encounter for antineoplastic chemotherapy: Secondary | ICD-10-CM | POA: Diagnosis not present

## 2023-05-19 DIAGNOSIS — C109 Malignant neoplasm of oropharynx, unspecified: Secondary | ICD-10-CM | POA: Diagnosis not present

## 2023-05-19 DIAGNOSIS — D490 Neoplasm of unspecified behavior of digestive system: Secondary | ICD-10-CM | POA: Diagnosis not present

## 2023-05-19 DIAGNOSIS — C108 Malignant neoplasm of overlapping sites of oropharynx: Secondary | ICD-10-CM | POA: Diagnosis not present

## 2023-05-19 DIAGNOSIS — N1831 Chronic kidney disease, stage 3a: Secondary | ICD-10-CM | POA: Diagnosis not present

## 2023-05-19 DIAGNOSIS — Z510 Encounter for antineoplastic radiation therapy: Secondary | ICD-10-CM | POA: Diagnosis not present

## 2023-05-20 DIAGNOSIS — Z5111 Encounter for antineoplastic chemotherapy: Secondary | ICD-10-CM | POA: Diagnosis not present

## 2023-05-20 DIAGNOSIS — C109 Malignant neoplasm of oropharynx, unspecified: Secondary | ICD-10-CM | POA: Diagnosis not present

## 2023-05-20 DIAGNOSIS — Z510 Encounter for antineoplastic radiation therapy: Secondary | ICD-10-CM | POA: Diagnosis not present

## 2023-05-20 DIAGNOSIS — N1831 Chronic kidney disease, stage 3a: Secondary | ICD-10-CM | POA: Diagnosis not present

## 2023-05-20 DIAGNOSIS — C108 Malignant neoplasm of overlapping sites of oropharynx: Secondary | ICD-10-CM | POA: Diagnosis not present

## 2023-05-20 DIAGNOSIS — Z95828 Presence of other vascular implants and grafts: Secondary | ICD-10-CM | POA: Diagnosis not present

## 2023-05-20 DIAGNOSIS — D490 Neoplasm of unspecified behavior of digestive system: Secondary | ICD-10-CM | POA: Diagnosis not present

## 2023-05-23 DIAGNOSIS — Z5111 Encounter for antineoplastic chemotherapy: Secondary | ICD-10-CM | POA: Diagnosis not present

## 2023-05-23 DIAGNOSIS — R1312 Dysphagia, oropharyngeal phase: Secondary | ICD-10-CM | POA: Diagnosis not present

## 2023-05-23 DIAGNOSIS — C108 Malignant neoplasm of overlapping sites of oropharynx: Secondary | ICD-10-CM | POA: Diagnosis not present

## 2023-05-23 DIAGNOSIS — N1831 Chronic kidney disease, stage 3a: Secondary | ICD-10-CM | POA: Diagnosis not present

## 2023-05-23 DIAGNOSIS — C109 Malignant neoplasm of oropharynx, unspecified: Secondary | ICD-10-CM | POA: Diagnosis not present

## 2023-05-23 DIAGNOSIS — Z95828 Presence of other vascular implants and grafts: Secondary | ICD-10-CM | POA: Diagnosis not present

## 2023-05-23 DIAGNOSIS — Z510 Encounter for antineoplastic radiation therapy: Secondary | ICD-10-CM | POA: Diagnosis not present

## 2023-05-23 DIAGNOSIS — D490 Neoplasm of unspecified behavior of digestive system: Secondary | ICD-10-CM | POA: Diagnosis not present

## 2023-05-24 DIAGNOSIS — C109 Malignant neoplasm of oropharynx, unspecified: Secondary | ICD-10-CM | POA: Diagnosis not present

## 2023-05-24 DIAGNOSIS — Z95828 Presence of other vascular implants and grafts: Secondary | ICD-10-CM | POA: Diagnosis not present

## 2023-05-24 DIAGNOSIS — Z510 Encounter for antineoplastic radiation therapy: Secondary | ICD-10-CM | POA: Diagnosis not present

## 2023-05-24 DIAGNOSIS — N1831 Chronic kidney disease, stage 3a: Secondary | ICD-10-CM | POA: Diagnosis not present

## 2023-05-24 DIAGNOSIS — C108 Malignant neoplasm of overlapping sites of oropharynx: Secondary | ICD-10-CM | POA: Diagnosis not present

## 2023-05-24 DIAGNOSIS — Z5111 Encounter for antineoplastic chemotherapy: Secondary | ICD-10-CM | POA: Diagnosis not present

## 2023-05-24 DIAGNOSIS — D490 Neoplasm of unspecified behavior of digestive system: Secondary | ICD-10-CM | POA: Diagnosis not present

## 2023-05-25 DIAGNOSIS — Z95828 Presence of other vascular implants and grafts: Secondary | ICD-10-CM | POA: Diagnosis not present

## 2023-05-25 DIAGNOSIS — N1831 Chronic kidney disease, stage 3a: Secondary | ICD-10-CM | POA: Diagnosis not present

## 2023-05-25 DIAGNOSIS — C108 Malignant neoplasm of overlapping sites of oropharynx: Secondary | ICD-10-CM | POA: Diagnosis not present

## 2023-05-25 DIAGNOSIS — Z5111 Encounter for antineoplastic chemotherapy: Secondary | ICD-10-CM | POA: Diagnosis not present

## 2023-05-25 DIAGNOSIS — D490 Neoplasm of unspecified behavior of digestive system: Secondary | ICD-10-CM | POA: Diagnosis not present

## 2023-05-25 DIAGNOSIS — Z510 Encounter for antineoplastic radiation therapy: Secondary | ICD-10-CM | POA: Diagnosis not present

## 2023-05-25 DIAGNOSIS — C109 Malignant neoplasm of oropharynx, unspecified: Secondary | ICD-10-CM | POA: Diagnosis not present

## 2023-05-26 DIAGNOSIS — C109 Malignant neoplasm of oropharynx, unspecified: Secondary | ICD-10-CM | POA: Diagnosis not present

## 2023-05-26 DIAGNOSIS — C108 Malignant neoplasm of overlapping sites of oropharynx: Secondary | ICD-10-CM | POA: Diagnosis not present

## 2023-05-26 DIAGNOSIS — Z5111 Encounter for antineoplastic chemotherapy: Secondary | ICD-10-CM | POA: Diagnosis not present

## 2023-05-26 DIAGNOSIS — Z95828 Presence of other vascular implants and grafts: Secondary | ICD-10-CM | POA: Diagnosis not present

## 2023-05-26 DIAGNOSIS — Z510 Encounter for antineoplastic radiation therapy: Secondary | ICD-10-CM | POA: Diagnosis not present

## 2023-05-26 DIAGNOSIS — N1831 Chronic kidney disease, stage 3a: Secondary | ICD-10-CM | POA: Diagnosis not present

## 2023-05-26 DIAGNOSIS — D490 Neoplasm of unspecified behavior of digestive system: Secondary | ICD-10-CM | POA: Diagnosis not present

## 2023-05-27 DIAGNOSIS — Z510 Encounter for antineoplastic radiation therapy: Secondary | ICD-10-CM | POA: Diagnosis not present

## 2023-05-27 DIAGNOSIS — Z5111 Encounter for antineoplastic chemotherapy: Secondary | ICD-10-CM | POA: Diagnosis not present

## 2023-05-27 DIAGNOSIS — N1831 Chronic kidney disease, stage 3a: Secondary | ICD-10-CM | POA: Diagnosis not present

## 2023-05-27 DIAGNOSIS — D490 Neoplasm of unspecified behavior of digestive system: Secondary | ICD-10-CM | POA: Diagnosis not present

## 2023-05-27 DIAGNOSIS — Z95828 Presence of other vascular implants and grafts: Secondary | ICD-10-CM | POA: Diagnosis not present

## 2023-05-27 DIAGNOSIS — R1312 Dysphagia, oropharyngeal phase: Secondary | ICD-10-CM | POA: Diagnosis not present

## 2023-05-27 DIAGNOSIS — C109 Malignant neoplasm of oropharynx, unspecified: Secondary | ICD-10-CM | POA: Diagnosis not present

## 2023-05-27 DIAGNOSIS — C108 Malignant neoplasm of overlapping sites of oropharynx: Secondary | ICD-10-CM | POA: Diagnosis not present

## 2023-05-30 DIAGNOSIS — K1379 Other lesions of oral mucosa: Secondary | ICD-10-CM | POA: Diagnosis not present

## 2023-05-30 DIAGNOSIS — Z95828 Presence of other vascular implants and grafts: Secondary | ICD-10-CM | POA: Diagnosis not present

## 2023-05-30 DIAGNOSIS — Z79630 Long term (current) use of alkylating agent: Secondary | ICD-10-CM | POA: Diagnosis not present

## 2023-05-30 DIAGNOSIS — C109 Malignant neoplasm of oropharynx, unspecified: Secondary | ICD-10-CM | POA: Diagnosis not present

## 2023-05-30 DIAGNOSIS — Z5111 Encounter for antineoplastic chemotherapy: Secondary | ICD-10-CM | POA: Diagnosis not present

## 2023-05-30 DIAGNOSIS — Z79899 Other long term (current) drug therapy: Secondary | ICD-10-CM | POA: Diagnosis not present

## 2023-05-30 DIAGNOSIS — Z510 Encounter for antineoplastic radiation therapy: Secondary | ICD-10-CM | POA: Diagnosis not present

## 2023-05-30 DIAGNOSIS — D490 Neoplasm of unspecified behavior of digestive system: Secondary | ICD-10-CM | POA: Diagnosis not present

## 2023-05-30 DIAGNOSIS — C108 Malignant neoplasm of overlapping sites of oropharynx: Secondary | ICD-10-CM | POA: Diagnosis not present

## 2023-05-31 ENCOUNTER — Other Ambulatory Visit: Payer: Self-pay | Admitting: Internal Medicine

## 2023-05-31 ENCOUNTER — Other Ambulatory Visit: Payer: Self-pay | Admitting: Orthopaedic Surgery

## 2023-05-31 DIAGNOSIS — C108 Malignant neoplasm of overlapping sites of oropharynx: Secondary | ICD-10-CM | POA: Diagnosis not present

## 2023-05-31 DIAGNOSIS — Z510 Encounter for antineoplastic radiation therapy: Secondary | ICD-10-CM | POA: Diagnosis not present

## 2023-05-31 DIAGNOSIS — D490 Neoplasm of unspecified behavior of digestive system: Secondary | ICD-10-CM | POA: Diagnosis not present

## 2023-05-31 DIAGNOSIS — C109 Malignant neoplasm of oropharynx, unspecified: Secondary | ICD-10-CM | POA: Diagnosis not present

## 2023-05-31 DIAGNOSIS — Z79899 Other long term (current) drug therapy: Secondary | ICD-10-CM | POA: Diagnosis not present

## 2023-05-31 DIAGNOSIS — K1379 Other lesions of oral mucosa: Secondary | ICD-10-CM | POA: Diagnosis not present

## 2023-05-31 DIAGNOSIS — Z79630 Long term (current) use of alkylating agent: Secondary | ICD-10-CM | POA: Diagnosis not present

## 2023-05-31 DIAGNOSIS — Z95828 Presence of other vascular implants and grafts: Secondary | ICD-10-CM | POA: Diagnosis not present

## 2023-05-31 DIAGNOSIS — Z5111 Encounter for antineoplastic chemotherapy: Secondary | ICD-10-CM | POA: Diagnosis not present

## 2023-06-01 DIAGNOSIS — D490 Neoplasm of unspecified behavior of digestive system: Secondary | ICD-10-CM | POA: Diagnosis not present

## 2023-06-01 DIAGNOSIS — C109 Malignant neoplasm of oropharynx, unspecified: Secondary | ICD-10-CM | POA: Diagnosis not present

## 2023-06-01 DIAGNOSIS — C108 Malignant neoplasm of overlapping sites of oropharynx: Secondary | ICD-10-CM | POA: Diagnosis not present

## 2023-06-01 DIAGNOSIS — Z5111 Encounter for antineoplastic chemotherapy: Secondary | ICD-10-CM | POA: Diagnosis not present

## 2023-06-01 DIAGNOSIS — Z95828 Presence of other vascular implants and grafts: Secondary | ICD-10-CM | POA: Diagnosis not present

## 2023-06-01 DIAGNOSIS — K1379 Other lesions of oral mucosa: Secondary | ICD-10-CM | POA: Diagnosis not present

## 2023-06-01 DIAGNOSIS — Z79630 Long term (current) use of alkylating agent: Secondary | ICD-10-CM | POA: Diagnosis not present

## 2023-06-01 DIAGNOSIS — Z79899 Other long term (current) drug therapy: Secondary | ICD-10-CM | POA: Diagnosis not present

## 2023-06-01 DIAGNOSIS — Z510 Encounter for antineoplastic radiation therapy: Secondary | ICD-10-CM | POA: Diagnosis not present

## 2023-06-03 DIAGNOSIS — C109 Malignant neoplasm of oropharynx, unspecified: Secondary | ICD-10-CM | POA: Diagnosis not present

## 2023-06-03 DIAGNOSIS — Z5111 Encounter for antineoplastic chemotherapy: Secondary | ICD-10-CM | POA: Diagnosis not present

## 2023-06-03 DIAGNOSIS — Z79630 Long term (current) use of alkylating agent: Secondary | ICD-10-CM | POA: Diagnosis not present

## 2023-06-03 DIAGNOSIS — C108 Malignant neoplasm of overlapping sites of oropharynx: Secondary | ICD-10-CM | POA: Diagnosis not present

## 2023-06-03 DIAGNOSIS — Z79899 Other long term (current) drug therapy: Secondary | ICD-10-CM | POA: Diagnosis not present

## 2023-06-03 DIAGNOSIS — D490 Neoplasm of unspecified behavior of digestive system: Secondary | ICD-10-CM | POA: Diagnosis not present

## 2023-06-03 DIAGNOSIS — Z95828 Presence of other vascular implants and grafts: Secondary | ICD-10-CM | POA: Diagnosis not present

## 2023-06-03 DIAGNOSIS — Z510 Encounter for antineoplastic radiation therapy: Secondary | ICD-10-CM | POA: Diagnosis not present

## 2023-06-03 DIAGNOSIS — K1379 Other lesions of oral mucosa: Secondary | ICD-10-CM | POA: Diagnosis not present

## 2023-06-06 ENCOUNTER — Other Ambulatory Visit: Payer: Self-pay

## 2023-06-06 DIAGNOSIS — G4733 Obstructive sleep apnea (adult) (pediatric): Secondary | ICD-10-CM | POA: Diagnosis not present

## 2023-06-06 DIAGNOSIS — Z5111 Encounter for antineoplastic chemotherapy: Secondary | ICD-10-CM | POA: Diagnosis not present

## 2023-06-06 DIAGNOSIS — C108 Malignant neoplasm of overlapping sites of oropharynx: Secondary | ICD-10-CM | POA: Diagnosis not present

## 2023-06-06 DIAGNOSIS — Z79899 Other long term (current) drug therapy: Secondary | ICD-10-CM | POA: Diagnosis not present

## 2023-06-06 DIAGNOSIS — D490 Neoplasm of unspecified behavior of digestive system: Secondary | ICD-10-CM | POA: Diagnosis not present

## 2023-06-06 DIAGNOSIS — Z95828 Presence of other vascular implants and grafts: Secondary | ICD-10-CM | POA: Diagnosis not present

## 2023-06-06 DIAGNOSIS — C109 Malignant neoplasm of oropharynx, unspecified: Secondary | ICD-10-CM | POA: Diagnosis not present

## 2023-06-06 DIAGNOSIS — Z510 Encounter for antineoplastic radiation therapy: Secondary | ICD-10-CM | POA: Diagnosis not present

## 2023-06-06 DIAGNOSIS — K1379 Other lesions of oral mucosa: Secondary | ICD-10-CM | POA: Diagnosis not present

## 2023-06-06 DIAGNOSIS — Z79630 Long term (current) use of alkylating agent: Secondary | ICD-10-CM | POA: Diagnosis not present

## 2023-06-06 MED ORDER — ROSUVASTATIN CALCIUM 40 MG PO TABS: ORAL_TABLET | ORAL | 3 refills | Status: DC

## 2023-06-07 DIAGNOSIS — C109 Malignant neoplasm of oropharynx, unspecified: Secondary | ICD-10-CM | POA: Diagnosis not present

## 2023-06-07 DIAGNOSIS — K1379 Other lesions of oral mucosa: Secondary | ICD-10-CM | POA: Diagnosis not present

## 2023-06-07 DIAGNOSIS — C108 Malignant neoplasm of overlapping sites of oropharynx: Secondary | ICD-10-CM | POA: Diagnosis not present

## 2023-06-07 DIAGNOSIS — Z5111 Encounter for antineoplastic chemotherapy: Secondary | ICD-10-CM | POA: Diagnosis not present

## 2023-06-07 DIAGNOSIS — Z79630 Long term (current) use of alkylating agent: Secondary | ICD-10-CM | POA: Diagnosis not present

## 2023-06-07 DIAGNOSIS — Z510 Encounter for antineoplastic radiation therapy: Secondary | ICD-10-CM | POA: Diagnosis not present

## 2023-06-07 DIAGNOSIS — Z79899 Other long term (current) drug therapy: Secondary | ICD-10-CM | POA: Diagnosis not present

## 2023-06-07 DIAGNOSIS — D490 Neoplasm of unspecified behavior of digestive system: Secondary | ICD-10-CM | POA: Diagnosis not present

## 2023-06-07 DIAGNOSIS — Z95828 Presence of other vascular implants and grafts: Secondary | ICD-10-CM | POA: Diagnosis not present

## 2023-06-08 DIAGNOSIS — Z95828 Presence of other vascular implants and grafts: Secondary | ICD-10-CM | POA: Diagnosis not present

## 2023-06-08 DIAGNOSIS — Z79899 Other long term (current) drug therapy: Secondary | ICD-10-CM | POA: Diagnosis not present

## 2023-06-08 DIAGNOSIS — C108 Malignant neoplasm of overlapping sites of oropharynx: Secondary | ICD-10-CM | POA: Diagnosis not present

## 2023-06-08 DIAGNOSIS — Z5111 Encounter for antineoplastic chemotherapy: Secondary | ICD-10-CM | POA: Diagnosis not present

## 2023-06-08 DIAGNOSIS — Z79630 Long term (current) use of alkylating agent: Secondary | ICD-10-CM | POA: Diagnosis not present

## 2023-06-08 DIAGNOSIS — D490 Neoplasm of unspecified behavior of digestive system: Secondary | ICD-10-CM | POA: Diagnosis not present

## 2023-06-08 DIAGNOSIS — Z510 Encounter for antineoplastic radiation therapy: Secondary | ICD-10-CM | POA: Diagnosis not present

## 2023-06-08 DIAGNOSIS — K1379 Other lesions of oral mucosa: Secondary | ICD-10-CM | POA: Diagnosis not present

## 2023-06-08 DIAGNOSIS — C109 Malignant neoplasm of oropharynx, unspecified: Secondary | ICD-10-CM | POA: Diagnosis not present

## 2023-06-09 DIAGNOSIS — C108 Malignant neoplasm of overlapping sites of oropharynx: Secondary | ICD-10-CM | POA: Diagnosis not present

## 2023-06-09 DIAGNOSIS — Z95828 Presence of other vascular implants and grafts: Secondary | ICD-10-CM | POA: Diagnosis not present

## 2023-06-09 DIAGNOSIS — Z510 Encounter for antineoplastic radiation therapy: Secondary | ICD-10-CM | POA: Diagnosis not present

## 2023-06-09 DIAGNOSIS — Z5111 Encounter for antineoplastic chemotherapy: Secondary | ICD-10-CM | POA: Diagnosis not present

## 2023-06-09 DIAGNOSIS — Z79630 Long term (current) use of alkylating agent: Secondary | ICD-10-CM | POA: Diagnosis not present

## 2023-06-09 DIAGNOSIS — K1379 Other lesions of oral mucosa: Secondary | ICD-10-CM | POA: Diagnosis not present

## 2023-06-09 DIAGNOSIS — Z79899 Other long term (current) drug therapy: Secondary | ICD-10-CM | POA: Diagnosis not present

## 2023-06-09 DIAGNOSIS — D490 Neoplasm of unspecified behavior of digestive system: Secondary | ICD-10-CM | POA: Diagnosis not present

## 2023-06-09 DIAGNOSIS — C109 Malignant neoplasm of oropharynx, unspecified: Secondary | ICD-10-CM | POA: Diagnosis not present

## 2023-06-10 DIAGNOSIS — C109 Malignant neoplasm of oropharynx, unspecified: Secondary | ICD-10-CM | POA: Diagnosis not present

## 2023-06-10 DIAGNOSIS — Z79630 Long term (current) use of alkylating agent: Secondary | ICD-10-CM | POA: Diagnosis not present

## 2023-06-10 DIAGNOSIS — C108 Malignant neoplasm of overlapping sites of oropharynx: Secondary | ICD-10-CM | POA: Diagnosis not present

## 2023-06-10 DIAGNOSIS — Z510 Encounter for antineoplastic radiation therapy: Secondary | ICD-10-CM | POA: Diagnosis not present

## 2023-06-10 DIAGNOSIS — Z5111 Encounter for antineoplastic chemotherapy: Secondary | ICD-10-CM | POA: Diagnosis not present

## 2023-06-10 DIAGNOSIS — Z95828 Presence of other vascular implants and grafts: Secondary | ICD-10-CM | POA: Diagnosis not present

## 2023-06-10 DIAGNOSIS — D490 Neoplasm of unspecified behavior of digestive system: Secondary | ICD-10-CM | POA: Diagnosis not present

## 2023-06-10 DIAGNOSIS — K1379 Other lesions of oral mucosa: Secondary | ICD-10-CM | POA: Diagnosis not present

## 2023-06-10 DIAGNOSIS — Z79899 Other long term (current) drug therapy: Secondary | ICD-10-CM | POA: Diagnosis not present

## 2023-06-13 DIAGNOSIS — Z79899 Other long term (current) drug therapy: Secondary | ICD-10-CM | POA: Diagnosis not present

## 2023-06-13 DIAGNOSIS — Z79630 Long term (current) use of alkylating agent: Secondary | ICD-10-CM | POA: Diagnosis not present

## 2023-06-13 DIAGNOSIS — C109 Malignant neoplasm of oropharynx, unspecified: Secondary | ICD-10-CM | POA: Diagnosis not present

## 2023-06-13 DIAGNOSIS — Z5111 Encounter for antineoplastic chemotherapy: Secondary | ICD-10-CM | POA: Diagnosis not present

## 2023-06-13 DIAGNOSIS — C108 Malignant neoplasm of overlapping sites of oropharynx: Secondary | ICD-10-CM | POA: Diagnosis not present

## 2023-06-13 DIAGNOSIS — Z95828 Presence of other vascular implants and grafts: Secondary | ICD-10-CM | POA: Diagnosis not present

## 2023-06-13 DIAGNOSIS — K1379 Other lesions of oral mucosa: Secondary | ICD-10-CM | POA: Diagnosis not present

## 2023-06-13 DIAGNOSIS — Z510 Encounter for antineoplastic radiation therapy: Secondary | ICD-10-CM | POA: Diagnosis not present

## 2023-06-13 DIAGNOSIS — D490 Neoplasm of unspecified behavior of digestive system: Secondary | ICD-10-CM | POA: Diagnosis not present

## 2023-06-14 DIAGNOSIS — C108 Malignant neoplasm of overlapping sites of oropharynx: Secondary | ICD-10-CM | POA: Diagnosis not present

## 2023-06-14 DIAGNOSIS — Z510 Encounter for antineoplastic radiation therapy: Secondary | ICD-10-CM | POA: Diagnosis not present

## 2023-06-14 DIAGNOSIS — Z95828 Presence of other vascular implants and grafts: Secondary | ICD-10-CM | POA: Diagnosis not present

## 2023-06-14 DIAGNOSIS — D490 Neoplasm of unspecified behavior of digestive system: Secondary | ICD-10-CM | POA: Diagnosis not present

## 2023-06-14 DIAGNOSIS — Z79899 Other long term (current) drug therapy: Secondary | ICD-10-CM | POA: Diagnosis not present

## 2023-06-14 DIAGNOSIS — K1379 Other lesions of oral mucosa: Secondary | ICD-10-CM | POA: Diagnosis not present

## 2023-06-14 DIAGNOSIS — Z79630 Long term (current) use of alkylating agent: Secondary | ICD-10-CM | POA: Diagnosis not present

## 2023-06-14 DIAGNOSIS — Z5111 Encounter for antineoplastic chemotherapy: Secondary | ICD-10-CM | POA: Diagnosis not present

## 2023-06-14 DIAGNOSIS — C109 Malignant neoplasm of oropharynx, unspecified: Secondary | ICD-10-CM | POA: Diagnosis not present

## 2023-06-15 ENCOUNTER — Other Ambulatory Visit: Payer: Self-pay | Admitting: Nurse Practitioner

## 2023-06-15 ENCOUNTER — Ambulatory Visit: Payer: Medicare Other | Admitting: Adult Health

## 2023-06-15 VITALS — BP 163/80 | HR 45 | Ht 65.0 in | Wt 161.0 lb

## 2023-06-15 DIAGNOSIS — G4733 Obstructive sleep apnea (adult) (pediatric): Secondary | ICD-10-CM | POA: Diagnosis not present

## 2023-06-15 DIAGNOSIS — J449 Chronic obstructive pulmonary disease, unspecified: Secondary | ICD-10-CM | POA: Diagnosis not present

## 2023-06-15 DIAGNOSIS — C109 Malignant neoplasm of oropharynx, unspecified: Secondary | ICD-10-CM | POA: Diagnosis not present

## 2023-06-15 DIAGNOSIS — Z95828 Presence of other vascular implants and grafts: Secondary | ICD-10-CM | POA: Diagnosis not present

## 2023-06-15 DIAGNOSIS — Z79899 Other long term (current) drug therapy: Secondary | ICD-10-CM | POA: Diagnosis not present

## 2023-06-15 DIAGNOSIS — Z79630 Long term (current) use of alkylating agent: Secondary | ICD-10-CM | POA: Diagnosis not present

## 2023-06-15 DIAGNOSIS — Z510 Encounter for antineoplastic radiation therapy: Secondary | ICD-10-CM | POA: Diagnosis not present

## 2023-06-15 DIAGNOSIS — K1379 Other lesions of oral mucosa: Secondary | ICD-10-CM | POA: Diagnosis not present

## 2023-06-15 DIAGNOSIS — Z5111 Encounter for antineoplastic chemotherapy: Secondary | ICD-10-CM | POA: Diagnosis not present

## 2023-06-15 DIAGNOSIS — C108 Malignant neoplasm of overlapping sites of oropharynx: Secondary | ICD-10-CM | POA: Diagnosis not present

## 2023-06-15 DIAGNOSIS — G2581 Restless legs syndrome: Secondary | ICD-10-CM

## 2023-06-15 DIAGNOSIS — D490 Neoplasm of unspecified behavior of digestive system: Secondary | ICD-10-CM | POA: Diagnosis not present

## 2023-06-15 NOTE — Progress Notes (Signed)
PATIENT: Stephen Holland DOB: Jul 24, 1951  REASON FOR VISIT: follow up HISTORY FROM: patient  Chief Complaint  Patient presents with   Obstructive Sleep Apnea    Rm 19 alone Pt is well and stable, no new concerns with OSA/CPAP since last visit     HISTORY OF PRESENT ILLNESS: Today 06/15/23:  Stephen Holland is a 72 y.o. male with a history of obstructive sleep apnea on CPAP. Returns today for follow-up.  Download is below.  Overall he feels that the CPAP is working well.  Denies any new issues.      REVIEW OF SYSTEMS: Out of a complete 14 system review of symptoms, the patient complains only of the following symptoms, and all other reviewed systems are negative.   ESS 1  ALLERGIES: Allergies  Allergen Reactions   Allopurinol Rash    HOME MEDICATIONS: Outpatient Medications Prior to Visit  Medication Sig Dispense Refill   acetaminophen (TYLENOL) 650 MG CR tablet Take 650 mg by mouth every 8 (eight) hours as needed for pain.     bisoprolol (ZEBETA) 10 MG tablet TAKE 1 TABLET BY MOUTH EVERY DAY FOR BLOOD PRESSURE 90 tablet 1   Cholecalciferol (VITAMIN D3) 125 MCG (5000 UT) CAPS Take 10,000 Units by mouth See admin instructions. Take tues., thurs., Sat., Sun.     clonazePAM (KLONOPIN) 1 MG tablet TAKE 1/2 TO 1 TABLETS BY MOUTH AT BEDTIME AS NEEDED (RESTLESS LEG SYNDROME). (Patient taking differently: Take 1 mg by mouth 4 (four) times a week.) 90 tablet 0   fluconazole (DIFLUCAN) 100 MG tablet Take one tablet by mouth on Monday and Thursday during radiation.     gabapentin (NEURONTIN) 600 MG tablet TAKE 1/2 TO 1 TABLET 2 TO 3 TIMES DAILY AS NEEDED FOR NEUROPATHY PAIN (Patient taking differently: Take 600 mg by mouth 2 (two) times daily.) 270 tablet 3   HYDROcodone-acetaminophen (NORCO/VICODIN) 5-325 MG tablet Take 1 tablet by mouth every 8 (eight) hours as needed for moderate pain (throat pain; ear pain). 30 tablet 0   hyoscyamine (LEVSIN) 0.125 MG tablet Take 1 tablet (0.125  mg total) by mouth every 4 (four) hours as needed for cramping (diarrhea, nausea). 50 tablet 2   KRILL OIL PO Take 750 mg by mouth every morning. Mega Red     meloxicam (MOBIC) 15 MG tablet TAKE 1 TABLET BY MOUTH DAILY AS NEEDED WITH FOOD FOR PAIN & INFLAMMATION (Patient taking differently: Take 15 mg by mouth in the morning.) 90 tablet 3   Multiple Vitamins-Minerals (MEGA 2000 PO) Take by mouth.     pantoprazole (PROTONIX) 40 MG tablet TAKE 1 TABLET BY MOUTH EVERY DAY 90 tablet 1   rosuvastatin (CRESTOR) 40 MG tablet TAKE 1 TABLET BY MOUTH EVERY DAY FOR CHOLESTEROL 90 tablet 3   telmisartan (MICARDIS) 80 MG tablet TAKE 1 TABLET BY MOUTH EVERY DAY FOR BLOOD PRESSURE (Patient taking differently: Take 80 mg by mouth every evening. FOR BLOOD PRESSURE) 90 tablet 3   triamcinolone ointment (KENALOG) 0.1 % APPLY A THIN LAYER TO AFFECTED AREA TWICE DAILY BUT NOT WITHIN 2 HOURS PRIOR TO RADIATION THERAPY     No facility-administered medications prior to visit.    PAST MEDICAL HISTORY: Past Medical History:  Diagnosis Date   AKI (acute kidney injury) (HCC) 03/01/2021   CKD (chronic kidney disease) stage 3, GFR 30-59 ml/min (HCC) 02/02/2023   stage 3a - GFR 48   Colon polyps    DJD (degenerative joint disease)  Dyspnea    with exertion   ED (erectile dysfunction)    GERD (gastroesophageal reflux disease)    Gout    Hyperlipemia    Hypertension    Obstructive sleep apnea    08/2012, cpap setting of 14   Prostate cancer (HCC) 01/2002   s/p prostatectomy   Vitamin D deficiency     PAST SURGICAL HISTORY: Past Surgical History:  Procedure Laterality Date   CATARACT EXTRACTION, BILATERAL Bilateral 12/2020   Dr. Darel Hong   COLONOSCOPY WITH PROPOFOL N/A 09/06/2016   Procedure: COLONOSCOPY WITH PROPOFOL;  Surgeon: Charolett Bumpers, MD;  Location: [facility];  Service: Endoscopy;  Laterality: N/A;   colonscopy     x 3 with polyp one time   DIRECT LARYNGOSCOPY N/A 03/24/2023   Procedure:  DIRECT LARYNGOSCOPY WITH BIOPSIES;  Surgeon: Scarlette Ar, MD;  Location: [facility];  Service: ENT;  Laterality: N/A;   PROSTATECTOMY  2003   SKIN CANCER EXCISION      FAMILY HISTORY: Family History  Problem Relation Age of Onset   Hypertension Mother    Pulmonary fibrosis Mother    Stroke Father    Heart attack Maternal Grandmother    Stroke Maternal Grandfather    Stroke Paternal Grandmother    Colon cancer Paternal Grandfather     SOCIAL HISTORY: Social History   Socioeconomic History   Marital status: Married    Spouse name: [name]   Number of children: 2   Years of education: 12   Highest education level: Not on file  Occupational History    Comment: truck driver  Tobacco Use   Smoking status: Every Day    Current packs/day: 1.00    Average packs/day: 1 pack/day for 55.0 years (55.0 ttl pk-yrs)    Types: Cigarettes   Smokeless tobacco: Never  Vaping Use   Vaping status: Never Used  Substance and Sexual Activity   Alcohol use: Not Currently    Alcohol/week: 7.0 - 14.0 standard drinks of alcohol    Types: 7 - 14 Shots of liquor per week    Comment: 03/23/23:  1-2 mixed drinks per day   Drug use: No   Sexual activity: Not on file  Other Topics Concern   Not on file  Social History Narrative   Lives home with wife. Retired.  Education HS.  Children 2     Caffeine 6 cups coffee daily, 2-3 glasses of tea and one coke per day   Social Determinants of Health   Financial Resource Strain: Not on file  Food Insecurity: No Food Insecurity (02/04/2022)   Hunger Vital Sign    Worried About Running Out of Food in the Last Year: Never true    Ran Out of Food in the Last Year: Never true  Transportation Needs: No Transportation Needs (11/10/2021)   PRAPARE - Administrator, Civil Service (Medical): No    Lack of Transportation (Non-Medical): No  Physical Activity: Not on file  Stress: Not on file  Social Connections: Not on file  Intimate Partner Violence: Not  on file      PHYSICAL EXAM  Vitals:   06/15/23 1026 06/15/23 1030 06/15/23 1031  BP: (!) 169/79 (!) 165/72 (!) 163/80  Pulse: (!) 46 (!) 55 (!) 45  Weight: 161 lb (73 kg)    Height: 5\' 5"  (1.651 m)     Body mass index is 26.79 kg/m.  Generalized: Well developed, in no acute distress  Chest: Lungs clear to auscultation bilaterally  Neurological examination  Mentation: Alert oriented to time, place, history taking. Follows all commands speech and language fluent Cranial nerve II-XII: Facial symmetry noted  DIAGNOSTIC DATA (LABS, IMAGING, TESTING) - I reviewed patient records, labs, notes, testing and imaging myself where available.  Lab Results  Component Value Date   WBC 11.1 (H) 03/24/2023   HGB 15.4 03/24/2023   HCT 46.7 03/24/2023   MCV 100.2 (H) 03/24/2023   PLT 172 03/24/2023      Component Value Date/Time   NA 139 03/24/2023 1250   NA 139 05/08/2020 0922   K 4.1 03/24/2023 1250   CL 104 03/24/2023 1250   CO2 24 03/24/2023 1250   GLUCOSE 94 03/24/2023 1250   BUN 27 (H) 03/24/2023 1250   BUN 29 (H) 05/08/2020 0922   CREATININE 1.36 (H) 03/24/2023 1250   CREATININE 1.53 (H) 02/02/2023 1101   CALCIUM 9.3 03/24/2023 1250   PROT 7.0 02/02/2023 1101   ALBUMIN 4.1 06/13/2017 1004   AST 23 02/02/2023 1101   ALT 23 02/02/2023 1101   ALKPHOS 58 06/13/2017 1004   BILITOT 0.8 02/02/2023 1101   GFRNONAA 55 (L) 03/24/2023 1250   GFRNONAA 44 (L) 03/25/2021 1123   GFRAA 51 (L) 03/25/2021 1123   Lab Results  Component Value Date   CHOL 103 02/02/2023   HDL 30 (L) 02/02/2023   LDLCALC 50 02/02/2023   TRIG 142 02/02/2023   CHOLHDL 3.4 02/02/2023   Lab Results  Component Value Date   HGBA1C 5.9 (H) 02/02/2023   Lab Results  Component Value Date   VITAMINB12 517 08/21/2020   Lab Results  Component Value Date   TSH 0.44 02/02/2023      ASSESSMENT AND PLAN 72 y.o. year old male  has a past medical history of AKI (acute kidney injury) (HCC) (03/01/2021),  CKD (chronic kidney disease) stage 3, GFR 30-59 ml/min (HCC) (02/02/2023), Colon polyps, DJD (degenerative joint disease), Dyspnea, ED (erectile dysfunction), GERD (gastroesophageal reflux disease), Gout, Hyperlipemia, Hypertension, Obstructive sleep apnea, Prostate cancer (HCC) (01/2002), and Vitamin D deficiency. here with:  OSA on CPAP  - CPAP compliance excellent - Good treatment of AHI  - Encourage patient to use CPAP nightly and > 4 hours each night -Blood pressure is elevated today.  If it remains elevated when he is checking at home he was advised to make his PCP aware - F/U in 1 year or sooner if needed    Butch Penny, MSN, NP-C 06/15/2023, 10:34 AM [facility] [address] [place], [place] [address] [phone]

## 2023-06-15 NOTE — Patient Instructions (Signed)
 Continue using CPAP nightly and greater than 4 hours each night °If your symptoms worsen or you develop new symptoms please let us know.  ° °

## 2023-06-16 DIAGNOSIS — Z79899 Other long term (current) drug therapy: Secondary | ICD-10-CM | POA: Diagnosis not present

## 2023-06-16 DIAGNOSIS — D490 Neoplasm of unspecified behavior of digestive system: Secondary | ICD-10-CM | POA: Diagnosis not present

## 2023-06-16 DIAGNOSIS — Z510 Encounter for antineoplastic radiation therapy: Secondary | ICD-10-CM | POA: Diagnosis not present

## 2023-06-16 DIAGNOSIS — C108 Malignant neoplasm of overlapping sites of oropharynx: Secondary | ICD-10-CM | POA: Diagnosis not present

## 2023-06-16 DIAGNOSIS — Z79630 Long term (current) use of alkylating agent: Secondary | ICD-10-CM | POA: Diagnosis not present

## 2023-06-16 DIAGNOSIS — C109 Malignant neoplasm of oropharynx, unspecified: Secondary | ICD-10-CM | POA: Diagnosis not present

## 2023-06-16 DIAGNOSIS — K1379 Other lesions of oral mucosa: Secondary | ICD-10-CM | POA: Diagnosis not present

## 2023-06-16 DIAGNOSIS — Z5111 Encounter for antineoplastic chemotherapy: Secondary | ICD-10-CM | POA: Diagnosis not present

## 2023-06-16 DIAGNOSIS — Z95828 Presence of other vascular implants and grafts: Secondary | ICD-10-CM | POA: Diagnosis not present

## 2023-06-17 DIAGNOSIS — D490 Neoplasm of unspecified behavior of digestive system: Secondary | ICD-10-CM | POA: Diagnosis not present

## 2023-06-17 DIAGNOSIS — Z5111 Encounter for antineoplastic chemotherapy: Secondary | ICD-10-CM | POA: Diagnosis not present

## 2023-06-17 DIAGNOSIS — C108 Malignant neoplasm of overlapping sites of oropharynx: Secondary | ICD-10-CM | POA: Diagnosis not present

## 2023-06-17 DIAGNOSIS — Z79630 Long term (current) use of alkylating agent: Secondary | ICD-10-CM | POA: Diagnosis not present

## 2023-06-17 DIAGNOSIS — K1379 Other lesions of oral mucosa: Secondary | ICD-10-CM | POA: Diagnosis not present

## 2023-06-17 DIAGNOSIS — Z510 Encounter for antineoplastic radiation therapy: Secondary | ICD-10-CM | POA: Diagnosis not present

## 2023-06-17 DIAGNOSIS — Z95828 Presence of other vascular implants and grafts: Secondary | ICD-10-CM | POA: Diagnosis not present

## 2023-06-17 DIAGNOSIS — C109 Malignant neoplasm of oropharynx, unspecified: Secondary | ICD-10-CM | POA: Diagnosis not present

## 2023-06-17 DIAGNOSIS — Z79899 Other long term (current) drug therapy: Secondary | ICD-10-CM | POA: Diagnosis not present

## 2023-06-20 DIAGNOSIS — D490 Neoplasm of unspecified behavior of digestive system: Secondary | ICD-10-CM | POA: Diagnosis not present

## 2023-06-20 DIAGNOSIS — C108 Malignant neoplasm of overlapping sites of oropharynx: Secondary | ICD-10-CM | POA: Diagnosis not present

## 2023-06-20 DIAGNOSIS — C109 Malignant neoplasm of oropharynx, unspecified: Secondary | ICD-10-CM | POA: Diagnosis not present

## 2023-06-20 DIAGNOSIS — Z79630 Long term (current) use of alkylating agent: Secondary | ICD-10-CM | POA: Diagnosis not present

## 2023-06-20 DIAGNOSIS — Z79899 Other long term (current) drug therapy: Secondary | ICD-10-CM | POA: Diagnosis not present

## 2023-06-20 DIAGNOSIS — Z510 Encounter for antineoplastic radiation therapy: Secondary | ICD-10-CM | POA: Diagnosis not present

## 2023-06-20 DIAGNOSIS — Z5111 Encounter for antineoplastic chemotherapy: Secondary | ICD-10-CM | POA: Diagnosis not present

## 2023-06-20 DIAGNOSIS — K1379 Other lesions of oral mucosa: Secondary | ICD-10-CM | POA: Diagnosis not present

## 2023-06-20 DIAGNOSIS — Z95828 Presence of other vascular implants and grafts: Secondary | ICD-10-CM | POA: Diagnosis not present

## 2023-06-21 DIAGNOSIS — Z79630 Long term (current) use of alkylating agent: Secondary | ICD-10-CM | POA: Diagnosis not present

## 2023-06-21 DIAGNOSIS — Z510 Encounter for antineoplastic radiation therapy: Secondary | ICD-10-CM | POA: Diagnosis not present

## 2023-06-21 DIAGNOSIS — C109 Malignant neoplasm of oropharynx, unspecified: Secondary | ICD-10-CM | POA: Diagnosis not present

## 2023-06-21 DIAGNOSIS — D490 Neoplasm of unspecified behavior of digestive system: Secondary | ICD-10-CM | POA: Diagnosis not present

## 2023-06-21 DIAGNOSIS — K1379 Other lesions of oral mucosa: Secondary | ICD-10-CM | POA: Diagnosis not present

## 2023-06-21 DIAGNOSIS — Z79899 Other long term (current) drug therapy: Secondary | ICD-10-CM | POA: Diagnosis not present

## 2023-06-21 DIAGNOSIS — Z95828 Presence of other vascular implants and grafts: Secondary | ICD-10-CM | POA: Diagnosis not present

## 2023-06-21 DIAGNOSIS — C108 Malignant neoplasm of overlapping sites of oropharynx: Secondary | ICD-10-CM | POA: Diagnosis not present

## 2023-06-21 DIAGNOSIS — Z5111 Encounter for antineoplastic chemotherapy: Secondary | ICD-10-CM | POA: Diagnosis not present

## 2023-06-22 DIAGNOSIS — Z95828 Presence of other vascular implants and grafts: Secondary | ICD-10-CM | POA: Diagnosis not present

## 2023-06-22 DIAGNOSIS — Z79630 Long term (current) use of alkylating agent: Secondary | ICD-10-CM | POA: Diagnosis not present

## 2023-06-22 DIAGNOSIS — C109 Malignant neoplasm of oropharynx, unspecified: Secondary | ICD-10-CM | POA: Diagnosis not present

## 2023-06-22 DIAGNOSIS — D490 Neoplasm of unspecified behavior of digestive system: Secondary | ICD-10-CM | POA: Diagnosis not present

## 2023-06-22 DIAGNOSIS — C108 Malignant neoplasm of overlapping sites of oropharynx: Secondary | ICD-10-CM | POA: Diagnosis not present

## 2023-06-22 DIAGNOSIS — Z5111 Encounter for antineoplastic chemotherapy: Secondary | ICD-10-CM | POA: Diagnosis not present

## 2023-06-22 DIAGNOSIS — Z79899 Other long term (current) drug therapy: Secondary | ICD-10-CM | POA: Diagnosis not present

## 2023-06-22 DIAGNOSIS — K1379 Other lesions of oral mucosa: Secondary | ICD-10-CM | POA: Diagnosis not present

## 2023-06-22 DIAGNOSIS — Z510 Encounter for antineoplastic radiation therapy: Secondary | ICD-10-CM | POA: Diagnosis not present

## 2023-06-23 DIAGNOSIS — Z95828 Presence of other vascular implants and grafts: Secondary | ICD-10-CM | POA: Diagnosis not present

## 2023-06-23 DIAGNOSIS — D490 Neoplasm of unspecified behavior of digestive system: Secondary | ICD-10-CM | POA: Diagnosis not present

## 2023-06-23 DIAGNOSIS — Z79899 Other long term (current) drug therapy: Secondary | ICD-10-CM | POA: Diagnosis not present

## 2023-06-23 DIAGNOSIS — C109 Malignant neoplasm of oropharynx, unspecified: Secondary | ICD-10-CM | POA: Diagnosis not present

## 2023-06-23 DIAGNOSIS — Z79630 Long term (current) use of alkylating agent: Secondary | ICD-10-CM | POA: Diagnosis not present

## 2023-06-23 DIAGNOSIS — Z510 Encounter for antineoplastic radiation therapy: Secondary | ICD-10-CM | POA: Diagnosis not present

## 2023-06-23 DIAGNOSIS — K1379 Other lesions of oral mucosa: Secondary | ICD-10-CM | POA: Diagnosis not present

## 2023-06-23 DIAGNOSIS — C108 Malignant neoplasm of overlapping sites of oropharynx: Secondary | ICD-10-CM | POA: Diagnosis not present

## 2023-06-23 DIAGNOSIS — Z5111 Encounter for antineoplastic chemotherapy: Secondary | ICD-10-CM | POA: Diagnosis not present

## 2023-06-24 DIAGNOSIS — Z79630 Long term (current) use of alkylating agent: Secondary | ICD-10-CM | POA: Diagnosis not present

## 2023-06-24 DIAGNOSIS — C108 Malignant neoplasm of overlapping sites of oropharynx: Secondary | ICD-10-CM | POA: Diagnosis not present

## 2023-06-24 DIAGNOSIS — D490 Neoplasm of unspecified behavior of digestive system: Secondary | ICD-10-CM | POA: Diagnosis not present

## 2023-06-24 DIAGNOSIS — Z510 Encounter for antineoplastic radiation therapy: Secondary | ICD-10-CM | POA: Diagnosis not present

## 2023-06-24 DIAGNOSIS — Z5111 Encounter for antineoplastic chemotherapy: Secondary | ICD-10-CM | POA: Diagnosis not present

## 2023-06-24 DIAGNOSIS — Z79899 Other long term (current) drug therapy: Secondary | ICD-10-CM | POA: Diagnosis not present

## 2023-06-24 DIAGNOSIS — Z95828 Presence of other vascular implants and grafts: Secondary | ICD-10-CM | POA: Diagnosis not present

## 2023-06-24 DIAGNOSIS — K1379 Other lesions of oral mucosa: Secondary | ICD-10-CM | POA: Diagnosis not present

## 2023-06-24 DIAGNOSIS — C109 Malignant neoplasm of oropharynx, unspecified: Secondary | ICD-10-CM | POA: Diagnosis not present

## 2023-06-27 DIAGNOSIS — C109 Malignant neoplasm of oropharynx, unspecified: Secondary | ICD-10-CM | POA: Diagnosis not present

## 2023-06-27 DIAGNOSIS — Z95828 Presence of other vascular implants and grafts: Secondary | ICD-10-CM | POA: Diagnosis not present

## 2023-06-27 DIAGNOSIS — Z5111 Encounter for antineoplastic chemotherapy: Secondary | ICD-10-CM | POA: Diagnosis not present

## 2023-06-27 DIAGNOSIS — Z79630 Long term (current) use of alkylating agent: Secondary | ICD-10-CM | POA: Diagnosis not present

## 2023-06-27 DIAGNOSIS — Z510 Encounter for antineoplastic radiation therapy: Secondary | ICD-10-CM | POA: Diagnosis not present

## 2023-06-27 DIAGNOSIS — Z79899 Other long term (current) drug therapy: Secondary | ICD-10-CM | POA: Diagnosis not present

## 2023-06-27 DIAGNOSIS — D490 Neoplasm of unspecified behavior of digestive system: Secondary | ICD-10-CM | POA: Diagnosis not present

## 2023-06-27 DIAGNOSIS — C108 Malignant neoplasm of overlapping sites of oropharynx: Secondary | ICD-10-CM | POA: Diagnosis not present

## 2023-06-27 DIAGNOSIS — K1379 Other lesions of oral mucosa: Secondary | ICD-10-CM | POA: Diagnosis not present

## 2023-07-04 DIAGNOSIS — C108 Malignant neoplasm of overlapping sites of oropharynx: Secondary | ICD-10-CM | POA: Diagnosis not present

## 2023-07-07 DIAGNOSIS — G4733 Obstructive sleep apnea (adult) (pediatric): Secondary | ICD-10-CM | POA: Diagnosis not present

## 2023-07-08 DIAGNOSIS — C108 Malignant neoplasm of overlapping sites of oropharynx: Secondary | ICD-10-CM | POA: Diagnosis not present

## 2023-07-11 ENCOUNTER — Other Ambulatory Visit: Payer: Self-pay | Admitting: Nurse Practitioner

## 2023-07-11 DIAGNOSIS — C108 Malignant neoplasm of overlapping sites of oropharynx: Secondary | ICD-10-CM | POA: Diagnosis not present

## 2023-07-11 DIAGNOSIS — G2581 Restless legs syndrome: Secondary | ICD-10-CM

## 2023-07-12 ENCOUNTER — Encounter: Payer: Self-pay | Admitting: Nurse Practitioner

## 2023-07-12 ENCOUNTER — Ambulatory Visit (INDEPENDENT_AMBULATORY_CARE_PROVIDER_SITE_OTHER): Payer: Medicare Other | Admitting: Nurse Practitioner

## 2023-07-12 ENCOUNTER — Other Ambulatory Visit: Payer: Self-pay | Admitting: Nurse Practitioner

## 2023-07-12 VITALS — BP 138/78 | HR 56 | Temp 97.5°F | Ht 65.0 in | Wt 147.4 lb

## 2023-07-12 DIAGNOSIS — Z6824 Body mass index (BMI) 24.0-24.9, adult: Secondary | ICD-10-CM | POA: Diagnosis not present

## 2023-07-12 DIAGNOSIS — G2581 Restless legs syndrome: Secondary | ICD-10-CM

## 2023-07-12 DIAGNOSIS — I10 Essential (primary) hypertension: Secondary | ICD-10-CM | POA: Diagnosis not present

## 2023-07-12 DIAGNOSIS — C108 Malignant neoplasm of overlapping sites of oropharynx: Secondary | ICD-10-CM | POA: Diagnosis not present

## 2023-07-12 NOTE — Progress Notes (Signed)
Assessment and Plan: Stephen Holland was seen today for acute visit.  Diagnoses and all orders for this visit:  Essential hypertension - continue medications, DASH diet, exercise and monitor at home. Check BP daily at home- if consistently greater than 140/90 notify the office Keep appointment scheduled with Dr. 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 Discussed med's effects and SE's.   Over 30 minutes of exam, counseling, chart review, and critical decision making was performed.   Future Appointments  Date Time Provider Department Center  08/11/2023 11:30 AM Lucky Cowboy, MD [facility] None  02/07/2024 11:00 AM Lucky Cowboy, MD [facility] None  06/14/2024 11:00 AM Butch Penny, NP [facility] None    ------------------------------------------------------------------------------------------------------------------   HPI BP 138/78   Pulse (!) 56   Temp (!) 97.5 F (36.4 C)   Ht 5\' 5"  (1.651 m)   Wt 147 lb 6.4 oz (66.9 kg)   SpO2 98%   BMI 24.53 kg/m  72 y.o.male presents for evaluation of hypertension. He finished his chemotherapy(7 treatments) and radiation(35 treatments).   BP had been elevated several visits at oncology BP was elevated at oncology yesterday BP was 159/76 after clonidine , initial BP not in note: Patient received IV fluids via IVAD. IVAD flushed with saline per protocol. Patient tolerated well. Pt educated about infusion and given opportunity to ask questions. Patient verbalized understanding of education. BP was elevated on post infusion vitals even when taken manually. No other symptoms reported. [facility] consulted, ordered for clonidine 0.1 to be given PO. BP rechecked after 30 minutes still elevated, Rechecked after 1  hour, significantly improved. Encouraged to take BP meds consistently & follow up with primary provider. AVS given. D/c alert & ambulatory to home.   He is currently on Zebeta 10 mg every day and Telmisartan 80 mg every day - prior to last week he was not regularly taking his meds due to difficulty swallowing, has been taking regularly for past 10 days.  BP Readings from Last 3 Encounters:  07/12/23 138/78  06/15/23 (!) 163/80  04/05/23 136/73  Denies headaches, chest pain, shortness of breath and dizziness   BMI is Body mass index is 24.53 kg/m., he has not been working on diet and exercise. He is currently receiving chemotherapy for malignant neoplasm of oropharynx. He is down 14 pounds in 1 month. His weight was 205 02/2022. He will use Boost or Ensure occasionally. Starting to eat a little bit more. He is able to eat more soups, soft foods  Wt Readings from Last 3 Encounters:  07/12/23 147 lb 6.4 oz (66.9 kg)  06/15/23 161 lb (73 kg)  04/05/23 166 lb (75.3 kg)    He was diagnosed with oropharyngeal cancer and saw Dr. Donne Hazel Radiation Oncology on 03/26/23- Her note: Stephen Holland has been diagnosed with Malignant neoplasm of oropharynx (CMS/HCC) and will be undergoing a course of radiation therapy. The relevant pathologic reports and diagnostic imaging studies have been reviewed.  Anticipated Plan: Is this for a new course of treatment? Yes Treatment Goal: Curative Treatment Site: Head and neck Radiation Modality: Photon Treatment Technique: IMRT # Fractions: 35 Concurrent Chemotherapy: Yes Boost CT Simulation: No   He is still smoking 1/2 ppd and trying to continue to cut down and quit. Plan is to  have PET scan in 3 months. Continues to follow with oncology.   Past Medical History:  Diagnosis Date   AKI (acute kidney injury) (HCC) 03/01/2021   CKD (chronic kidney disease) stage 3, GFR 30-59 ml/min (HCC) 02/02/2023   stage 3a - GFR 48   Colon polyps    DJD (degenerative joint  disease)    Dyspnea    with exertion   ED (erectile dysfunction)    GERD (gastroesophageal reflux disease)    Gout    Hyperlipemia    Hypertension    Obstructive sleep apnea    08/2012, cpap setting of 14   Prostate cancer (HCC) 01/2002   s/p prostatectomy   Vitamin D deficiency      Allergies  Allergen Reactions   Allopurinol Rash    Current Outpatient Medications on File Prior to Visit  Medication Sig   acetaminophen (TYLENOL) 650 MG CR tablet Take 650 mg by mouth every 8 (eight) hours as needed for pain.   bisoprolol (ZEBETA) 10 MG tablet TAKE 1 TABLET BY MOUTH EVERY DAY FOR BLOOD PRESSURE   Cholecalciferol (VITAMIN D3) 125 MCG (5000 UT) CAPS Take 10,000 Units by mouth See admin instructions. Take tues., thurs., Sat., Sun.   doxepin (SINEQUAN) 10 MG/ML solution Take by mouth.   fluconazole (DIFLUCAN) 100 MG tablet Take one tablet by mouth on Monday and Thursday during radiation.   gabapentin (NEURONTIN) 600 MG tablet TAKE 1/2 TO 1 TABLET 2 TO 3 TIMES DAILY AS NEEDED FOR NEUROPATHY PAIN (Patient taking differently: Take 600 mg by mouth 2 (two) times daily.)   KRILL OIL PO Take 750 mg by mouth every morning. Mega Red   meloxicam (MOBIC) 15 MG tablet TAKE 1 TABLET BY MOUTH DAILY AS NEEDED WITH FOOD FOR PAIN & INFLAMMATION (Patient taking differently: Take 15 mg by mouth in the morning.)   oxyCODONE (OXY IR/ROXICODONE) 5 MG immediate release tablet Take by mouth.   pantoprazole (PROTONIX) 40 MG tablet TAKE 1 TABLET BY MOUTH EVERY DAY   rosuvastatin (CRESTOR) 40 MG tablet TAKE 1 TABLET BY MOUTH EVERY DAY FOR CHOLESTEROL   telmisartan (MICARDIS) 80 MG tablet TAKE 1 TABLET BY MOUTH EVERY DAY FOR BLOOD PRESSURE (Patient taking differently: Take 80 mg by mouth every evening. FOR BLOOD PRESSURE)   HYDROcodone-acetaminophen (NORCO/VICODIN) 5-325 MG tablet Take 1 tablet by mouth every 8 (eight) hours as needed for moderate pain (throat pain; ear pain). (Patient not taking: Reported on  07/12/2023)   hyoscyamine (LEVSIN) 0.125 MG tablet Take 1 tablet (0.125 mg total) by mouth every 4 (four) hours as needed for cramping (diarrhea, nausea). (Patient not taking: Reported on 07/12/2023)   Multiple Vitamins-Minerals (MEGA 2000 PO) Take by mouth. (Patient not taking: Reported on 07/12/2023)   triamcinolone ointment (KENALOG) 0.1 % APPLY A THIN LAYER TO AFFECTED AREA TWICE DAILY BUT NOT WITHIN 2 HOURS PRIOR TO RADIATION THERAPY (Patient not taking: Reported on 07/12/2023)   No current facility-administered medications on file prior to visit.    ROS: all negative except above.   Physical Exam:  BP 138/78   Pulse (!) 56   Temp (!) 97.5 F (36.4 C)   Ht 5\' 5"  (1.651 m)   Wt 147 lb 6.4 oz (66.9 kg)   SpO2 98%   BMI 24.53 kg/m   General Appearance: Thin male, gray coloring, fatigued appearance Eyes: PERRLA, EOMs, conjunctiva no swelling or erythema Sinuses: No Frontal/maxillary tenderness ENT/Mouth: Ext aud canals clear, TMs without erythema, bulging. No erythema, swelling, or exudate  on post pharynx. Thrush in mouth/tongue. Hearing normal.  Neck: Supple, thyroid normal.  Respiratory: Respiratory effort normal, BS equal bilaterally without rales, rhonchi, wheezing or stridor.  Cardio: RRR with no MRGs. Brisk peripheral pulses without edema.  Abdomen: Soft, + BS.   Musculoskeletal: Full ROM, 5/5 strength, normal gait.  Skin: Warm, dry without rashes, lesions, ecchymosis.  Neuro: Cranial nerves intact. Normal muscle tone, no cerebellar symptoms. Sensation intact.  Psych: Awake and oriented X 3, normal affect, Insight and Judgment appropriate.     Raynelle Dick, NP 4:01 PM [facility]

## 2023-07-12 NOTE — Patient Instructions (Signed)
Omron wrist cuff for BP Take BP at home and keep log- if consistently greater than 140/90 notify the office  Continue Bisoprolol 10 mg daily and Telmisartan 80 mg daily  Hypertension, Adult Hypertension is another name for high blood pressure. High blood pressure forces your heart to work harder to pump blood. This can cause problems over time. There are two numbers in a blood pressure reading. There is a top number (systolic) over a bottom number (diastolic). It is best to have a blood pressure that is below 120/80. What are the causes? The cause of this condition is not known. Some other conditions can lead to high blood pressure. What increases the risk? Some lifestyle factors can make you more likely to develop high blood pressure: Smoking. Not getting enough exercise or physical activity. Being overweight. Having too much fat, sugar, calories, or salt (sodium) in your diet. Drinking too much alcohol. Other risk factors include: Having any of these conditions: Heart disease. Diabetes. High cholesterol. Kidney disease. Obstructive sleep apnea. Having a family history of high blood pressure and high cholesterol. Age. The risk increases with age. Stress. What are the signs or symptoms? High blood pressure may not cause symptoms. Very high blood pressure (hypertensive crisis) may cause: Headache. Fast or uneven heartbeats (palpitations). Shortness of breath. Nosebleed. Vomiting or feeling like you may vomit (nauseous). Changes in how you see. Very bad chest pain. Feeling dizzy. Seizures. How is this treated? This condition is treated by making healthy lifestyle changes, such as: Eating healthy foods. Exercising more. Drinking less alcohol. Your doctor may prescribe medicine if lifestyle changes do not help enough and if: Your top number is above 130. Your bottom number is above 80. Your personal target blood pressure may vary. Follow these instructions at home: Eating  and drinking  If told, follow the DASH eating plan. To follow this plan: Fill one half of your plate at each meal with fruits and vegetables. Fill one fourth of your plate at each meal with whole grains. Whole grains include whole-wheat pasta, brown rice, and whole-grain bread. Eat or drink low-fat dairy products, such as skim milk or low-fat yogurt. Fill one fourth of your plate at each meal with low-fat (lean) proteins. Low-fat proteins include fish, chicken without skin, eggs, beans, and tofu. Avoid fatty meat, cured and processed meat, or chicken with skin. Avoid pre-made or processed food. Limit the amount of salt in your diet to less than 1,500 mg each day. Do not drink alcohol if: Your doctor tells you not to drink. You are pregnant, may be pregnant, or are planning to become pregnant. If you drink alcohol: Limit how much you have to: 0-1 drink a day for women. 0-2 drinks a day for men. Know how much alcohol is in your drink. In the [place] one drink equals one 12 oz bottle of beer (355 mL), one 5 oz glass of wine (148 mL), or one 1 oz glass of hard liquor (44 mL). Lifestyle  Work with your doctor to stay at a healthy weight or to lose weight. Ask your doctor what the best weight is for you. Get at least 30 minutes of exercise that causes your heart to beat faster (aerobic exercise) most days of the week. This may include walking, swimming, or biking. Get at least 30 minutes of exercise that strengthens your muscles (resistance exercise) at least 3 days a week. This may include lifting weights or doing Pilates. Do not smoke or use any products that contain  nicotine or tobacco. If you need help quitting, ask your doctor. Check your blood pressure at home as told by your doctor. Keep all follow-up visits. Medicines Take over-the-counter and prescription medicines only as told by your doctor. Follow directions carefully. Do not skip doses of blood pressure medicine. The medicine does  not work as well if you skip doses. Skipping doses also puts you at risk for problems. Ask your doctor about side effects or reactions to medicines that you should watch for. Contact a doctor if: You think you are having a reaction to the medicine you are taking. You have headaches that keep coming back. You feel dizzy. You have swelling in your ankles. You have trouble with your vision. Get help right away if: You get a very bad headache. You start to feel mixed up (confused). You feel weak or numb. You feel faint. You have very bad pain in your: Chest. Belly (abdomen). You vomit more than once. You have trouble breathing. These symptoms may be an emergency. Get help right away. Call 911. Do not wait to see if the symptoms will go away. Do not drive yourself to the hospital. Summary Hypertension is another name for high blood pressure. High blood pressure forces your heart to work harder to pump blood. For most people, a normal blood pressure is less than 120/80. Making healthy choices can help lower blood pressure. If your blood pressure does not get lower with healthy choices, you may need to take medicine. This information is not intended to replace advice given to you by your health care provider. Make sure you discuss any questions you have with your health care provider. Document Revised: 09/03/2021 Document Reviewed: 09/03/2021 Elsevier Patient Education  2024 [employer].

## 2023-07-15 DIAGNOSIS — C108 Malignant neoplasm of overlapping sites of oropharynx: Secondary | ICD-10-CM | POA: Diagnosis not present

## 2023-07-18 DIAGNOSIS — N1831 Chronic kidney disease, stage 3a: Secondary | ICD-10-CM | POA: Diagnosis not present

## 2023-07-18 DIAGNOSIS — C108 Malignant neoplasm of overlapping sites of oropharynx: Secondary | ICD-10-CM | POA: Diagnosis not present

## 2023-07-18 DIAGNOSIS — Z95828 Presence of other vascular implants and grafts: Secondary | ICD-10-CM | POA: Diagnosis not present

## 2023-07-18 DIAGNOSIS — D649 Anemia, unspecified: Secondary | ICD-10-CM | POA: Diagnosis not present

## 2023-07-18 DIAGNOSIS — Z5111 Encounter for antineoplastic chemotherapy: Secondary | ICD-10-CM | POA: Diagnosis not present

## 2023-07-18 DIAGNOSIS — T451X5A Adverse effect of antineoplastic and immunosuppressive drugs, initial encounter: Secondary | ICD-10-CM | POA: Diagnosis not present

## 2023-07-18 DIAGNOSIS — D701 Agranulocytosis secondary to cancer chemotherapy: Secondary | ICD-10-CM | POA: Diagnosis not present

## 2023-07-18 DIAGNOSIS — D490 Neoplasm of unspecified behavior of digestive system: Secondary | ICD-10-CM | POA: Diagnosis not present

## 2023-08-10 NOTE — Progress Notes (Unsigned)
Future Appointments  Date Time Provider Department  08/11/2023                  6 mo  11:30 AM Lucky Cowboy, MD [facility]  02/07/2024                   cpe 11:00 AM Lucky Cowboy, MD [facility]  06/14/2024 11:00 AM Butch Penny, NP [facility]    History of Present Illness:       This very nice 72 y.o.  MWM presents for 6 month follow up with HTN, HLD, Pre-Diabetes and Vitamin D Deficiency. Patient has COPD (75 pk yr smoker) and is followed by Butch Penny, NP on CPAP for OSA .   Chest CT scan on 16 Nov 2019 showed Coronary Artery & Aortic Atherosclerosis .  Patient has hx/o Gout Asx off meds.       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  at [facility] in [place]. Thereafter he underwent Radiation therapy treatments & IV Chemotherapy at [facility] in [place].  Patient has lost 56# over the last year ( from 202# to 146#) .         Patient is treated for HTN (1980's) & BP has been controlled at home.  Today's BP is  at goal -  110/70 .  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  Patient does have CKD3a attributed to his HTN.  Patient has had no complaints of any cardiac type chest pain, palpitations, dyspnea / orthopnea / PND, dizziness, claudication, or dependent edema.        Hyperlipidemia is controlled with diet & Rosuvastatin. Patient denies myalgias or other med SE's. Last Lipids were at goal except elevated Trig's:  Lab Results  Component Value Date   CHOL 123 08/11/2023   HDL 43 08/11/2023   LDLCALC 59 08/11/2023   TRIG 125 08/11/2023   CHOLHDL 2.9 08/11/2023     Also, the patient has moderate Obesity (BMI 32+) and history of PreDiabetes  (A1c 5.7% /2011) and has had no symptoms of reactive hypoglycemia, diabetic polys, paresthesias or visual blurring.  Last A1c was not at goal:  Lab Results  Component Value Date   HGBA1C 5.0 08/11/2023                                                        Further, the patient also has history of Vitamin D Deficiency ("12" /2009) and supplements vitamin D without any suspected side-effects. Last vitamin D was sl elevated & dose was tapered.   Lab Results  Component Value Date   VD25OH 78 08/11/2023       Current Outpatient Medications  Medication Instructions   bisoprolol   10 MG tablet TAKE 1 TABLET EVERY DAY    clonazePAM   1 MG tablet TAKE 1/2 TO 1 TAB AT BEDTIME AS NEEDED   KRILL OIL PO 750 mg  Every morning, Mega Red   meloxicam   15 MG tablet TAKE 1 TABLET DAILY    Pantoprazole  40 mg  Daily   rosuvastatin   40 MG tablet TAKE 1 TABLET  EVERY DAY    Telmisartan  80 MG tablet TAKE 1  TABLET EVERY DAY    Vitamin D3 10,000 Units   Take tues., thurs., Sat., Sun.     Allergies  Allergen Reactions   Allopurinol Rash    PMHx:   Past Medical History:  Diagnosis Date   AKI (acute kidney injury) (HCC) 03/01/2021   Colon polyps    DJD (degenerative joint disease)    ED (erectile dysfunction)    Gout    Hyperlipemia    Hypertension    Obstructive sleep apnea    08/2012, cpap setting of 14   Prostate cancer (HCC) 01/2002   s/p prostatectomy   Vitamin D deficiency     Immunization History  Administered Date(s) Administered   Fluad Quad, high Dose 10/03/2020   Influenza Split 08/26/2014   Influenza, High Dose  08/12/2016, 10/17/2018, 09/05/2019   Influenza 10/27/2015   PFIZER-SARS-COV-2 Vacc 01/05/2020, 01/26/2020, 09/27/2020   PPD Test 05/23/2014, 07/23/2015   Pneumococcal -13 08/26/2014   Pneumococcal -23 05/05/2010, 01/30/2016   Td 11/30/2003   Tdap 08/12/2010, 08/21/2020, 03/01/2021   Zoster, Live 04/29/2013     Past Surgical History:  Procedure Laterality Date   CATARACT EXTRACTION, BILATERAL Bilateral 12/2020   Dr. Darel Hong   COLONOSCOPY WITH PROPOFOL N/A 09/06/2016   Procedure: COLONOSCOPY WITH PROPOFOL;  Surgeon: Charolett Bumpers, MD;  Location: [facility];  Service: Endoscopy;  Laterality: N/A;    colonscopy     x 3 with polyp one time   PROSTATECTOMY  2003   SKIN CANCER EXCISION      FHx:    Reviewed / unchanged  SHx:    Reviewed / unchanged   Systems Review:  Constitutional: Denies fever, chills, wt changes, headaches, insomnia, fatigue, night sweats, change in appetite. Eyes: Denies redness, blurred vision, diplopia, discharge, itchy, watery eyes.  ENT: Denies discharge, congestion, post nasal drip, epistaxis, sore throat, earache, hearing loss, dental pain, tinnitus, vertigo, sinus pain, snoring.  CV: Denies chest pain, palpitations, irregular heartbeat, syncope, dyspnea, diaphoresis, orthopnea, PND, claudication or edema. Respiratory: denies cough, dyspnea, DOE, pleurisy, hoarseness, laryngitis, wheezing.  Gastrointestinal: Denies dysphagia, odynophagia, heartburn, reflux, water brash, abdominal pain or cramps, nausea, vomiting, bloating, diarrhea, constipation, hematemesis, melena, hematochezia  or hemorrhoids. Genitourinary: Denies dysuria, frequency, urgency, nocturia, hesitancy, discharge, hematuria or flank pain. Musculoskeletal: Denies arthralgias, myalgias, stiffness, jt. swelling, pain, limping or strain/sprain.  Skin: Denies pruritus, rash, hives, warts, acne, eczema or change in skin lesion(s). Neuro: No weakness, tremor, incoordination, spasms, paresthesia or pain. Psychiatric: Denies confusion, memory loss or sensory loss. Endo: Denies change in weight, skin or hair change.  Heme/Lymph: No excessive bleeding, bruising or enlarged lymph nodes.  Physical Exam  BP 110/70   Pulse 66   Temp 97.9 F (36.6 C)   Resp 16   Ht 5\' 5"  (1.651 m)   Wt 146 lb 6.4 oz (66.4 kg)   SpO2 99%   BMI 24.36 kg/m   Appears  over nourished, well groomed  and in no distress.  Eyes: PERRLA, EOMs, conjunctiva no swelling or erythema. Sinuses: No frontal/maxillary tenderness ENT/Mouth: EAC's clear, TM's nl w/o erythema, bulging. Nares clear w/o erythema, swelling, exudates.  Oropharynx clear without erythema or exudates. Oral hygiene is good. Tongue normal, non obstructing. Hearing intact.  Neck: Supple. Thyroid not palpable. Car 2+/2+ without bruits, nodes or JVD. Chest: Respirations nl with BS clear & equal w/o rales, rhonchi, wheezing or stridor.  Cor: Heart sounds normal w/ regular rate and rhythm without sig. murmurs, gallops, clicks or rubs. Peripheral pulses normal and equal  without edema.  Abdomen: Soft & bowel sounds normal. Non-tender w/o guarding, rebound, hernias, masses or organomegaly.  Lymphatics: Unremarkable.  Musculoskeletal: Full ROM all peripheral extremities, joint stability, 5/5 strength and normal gait.  Skin: Warm, dry without exposed rashes, lesions or ecchymosis apparent.  Neuro: Cranial nerves intact, reflexes equal bilaterally. Sensory-motor testing grossly intact. Tendon reflexes grossly intact.  Pysch: Alert & oriented x 3.  Insight and judgement nl & appropriate. No ideations.  Assessment and Plan:  1. Essential hypertension  - CBC with Differential/Platelet - COMPLETE METABOLIC PANEL WITH GFR - Magnesium - TSH  2. Hyperlipidemia, mixed  - Lipid panel - TSH  3. Abnormal glucose  - Hemoglobin A1c - Insulin, random  4. Vitamin D deficiency  - VITAMIN D 25 Hydroxy   5. Idiopathic gout  - Uric acid  6. Aortic atherosclerosis (HCC) by Chest CT on 12.18/2020  - Lipid panel  7. Stage 3a chronic kidney disease (HCC)  - COMPLETE METABOLIC PANEL WITH GFR  8. Medication management  - CBC with Differential/Platelet - COMPLETE METABOLIC PANEL WITH GFR - Magnesium - Lipid panel - TSH - Hemoglobin A1c - Insulin, random - VITAMIN D 25 Hydroxy  - Uric acid         Discussed  regular exercise, BP monitoring, weight control to achieve/maintain BMI less than 25 and discussed med and SE's.  Recommended labs to assess and monitor clinical status with further disposition pending results of labs.  I discussed the  assessment and treatment plan with the patient. The patient was provided an opportunity to ask questions and all were answered. The patient agreed with the plan and demonstrated an understanding of the instructions.  I provided over 30 minutes of exam, counseling, chart review and  complex critical decision making.        The patient was advised to call back or seek an in-person evaluation if the symptoms worsen or if the condition fails to improve as anticipated.   Marinus Maw, MD

## 2023-08-11 ENCOUNTER — Ambulatory Visit (INDEPENDENT_AMBULATORY_CARE_PROVIDER_SITE_OTHER): Payer: Medicare Other | Admitting: Internal Medicine

## 2023-08-11 ENCOUNTER — Encounter: Payer: Self-pay | Admitting: Internal Medicine

## 2023-08-11 VITALS — BP 110/70 | HR 66 | Temp 97.9°F | Resp 16 | Ht 65.0 in | Wt 146.4 lb

## 2023-08-11 DIAGNOSIS — Z23 Encounter for immunization: Secondary | ICD-10-CM

## 2023-08-11 DIAGNOSIS — C029 Malignant neoplasm of tongue, unspecified: Secondary | ICD-10-CM | POA: Diagnosis not present

## 2023-08-11 DIAGNOSIS — Z79899 Other long term (current) drug therapy: Secondary | ICD-10-CM | POA: Diagnosis not present

## 2023-08-11 DIAGNOSIS — R7309 Other abnormal glucose: Secondary | ICD-10-CM

## 2023-08-11 DIAGNOSIS — M1000 Idiopathic gout, unspecified site: Secondary | ICD-10-CM | POA: Diagnosis not present

## 2023-08-11 DIAGNOSIS — E782 Mixed hyperlipidemia: Secondary | ICD-10-CM | POA: Diagnosis not present

## 2023-08-11 DIAGNOSIS — I10 Essential (primary) hypertension: Secondary | ICD-10-CM

## 2023-08-11 DIAGNOSIS — E559 Vitamin D deficiency, unspecified: Secondary | ICD-10-CM

## 2023-08-11 DIAGNOSIS — N1831 Chronic kidney disease, stage 3a: Secondary | ICD-10-CM | POA: Diagnosis not present

## 2023-08-11 DIAGNOSIS — I700 Atherosclerosis of aorta: Secondary | ICD-10-CM

## 2023-08-11 NOTE — Patient Instructions (Signed)

## 2023-08-12 ENCOUNTER — Other Ambulatory Visit: Payer: Self-pay | Admitting: Internal Medicine

## 2023-08-12 DIAGNOSIS — D500 Iron deficiency anemia secondary to blood loss (chronic): Secondary | ICD-10-CM

## 2023-08-12 LAB — CBC WITH DIFFERENTIAL/PLATELET
Absolute Monocytes: 500 {cells}/uL (ref 200–950)
Basophils Absolute: 31 {cells}/uL (ref 0–200)
Basophils Relative: 0.6 %
Eosinophils Absolute: 61 {cells}/uL (ref 15–500)
Eosinophils Relative: 1.2 %
HCT: 31.1 % — ABNORMAL LOW (ref 38.5–50.0)
Hemoglobin: 10.5 g/dL — ABNORMAL LOW (ref 13.2–17.1)
Lymphs Abs: 1040 {cells}/uL (ref 850–3900)
MCH: 34.5 pg — ABNORMAL HIGH (ref 27.0–33.0)
MCHC: 33.8 g/dL (ref 32.0–36.0)
MCV: 102.3 fL — ABNORMAL HIGH (ref 80.0–100.0)
MPV: 10.4 fL (ref 7.5–12.5)
Monocytes Relative: 9.8 %
Neutro Abs: 3468 {cells}/uL (ref 1500–7800)
Neutrophils Relative %: 68 %
Platelets: 185 10*3/uL (ref 140–400)
RBC: 3.04 10*6/uL — ABNORMAL LOW (ref 4.20–5.80)
RDW: 16.1 % — ABNORMAL HIGH (ref 11.0–15.0)
Total Lymphocyte: 20.4 %
WBC: 5.1 10*3/uL (ref 3.8–10.8)

## 2023-08-12 LAB — HEMOGLOBIN A1C
Hgb A1c MFr Bld: 5 %{Hb} (ref <5.7)
Mean Plasma Glucose: 97 mg/dL
eAG (mmol/L): 5.4 mmol/L

## 2023-08-12 LAB — LIPID PANEL
Cholesterol: 123 mg/dL (ref <200)
HDL: 43 mg/dL (ref >=40)
LDL Cholesterol (Calc): 59 mg/dL
Non-HDL Cholesterol (Calc): 80 mg/dL (ref <130)
Total CHOL/HDL Ratio: 2.9 (calc) (ref <5.0)
Triglycerides: 125 mg/dL (ref <150)

## 2023-08-12 LAB — COMPLETE METABOLIC PANEL WITH GFR
AG Ratio: 1.7 (calc) (ref 1.0–2.5)
ALT: 6 U/L — ABNORMAL LOW (ref 9–46)
AST: 10 U/L (ref 10–35)
Albumin: 4 g/dL (ref 3.6–5.1)
Alkaline phosphatase (APISO): 59 U/L (ref 35–144)
BUN/Creatinine Ratio: 13 (calc) (ref 6–22)
BUN: 17 mg/dL (ref 7–25)
CO2: 32 mmol/L (ref 20–32)
Calcium: 9.6 mg/dL (ref 8.6–10.3)
Chloride: 103 mmol/L (ref 98–110)
Creat: 1.32 mg/dL — ABNORMAL HIGH (ref 0.70–1.28)
Globulin: 2.3 g/dL (ref 1.9–3.7)
Glucose, Bld: 86 mg/dL (ref 65–99)
Potassium: 4.7 mmol/L (ref 3.5–5.3)
Sodium: 140 mmol/L (ref 135–146)
Total Bilirubin: 0.5 mg/dL (ref 0.2–1.2)
Total Protein: 6.3 g/dL (ref 6.1–8.1)
eGFR: 57 mL/min/{1.73_m2} — ABNORMAL LOW (ref >=60)

## 2023-08-12 LAB — URIC ACID: Uric Acid, Serum: 5.8 mg/dL (ref 4.0–8.0)

## 2023-08-12 LAB — INSULIN, RANDOM: Insulin: 10.7 u[IU]/mL

## 2023-08-12 LAB — TSH: TSH: 0.84 m[IU]/L (ref 0.40–4.50)

## 2023-08-12 LAB — MAGNESIUM: Magnesium: 1.6 mg/dL (ref 1.5–2.5)

## 2023-08-12 LAB — VITAMIN D 25 HYDROXY (VIT D DEFICIENCY, FRACTURES): Vit D, 25-Hydroxy: 78 ng/mL (ref 30–100)

## 2023-08-12 NOTE — Progress Notes (Signed)
<>*<>*<>*<>*<>*<>*<>*<>*<>*<>*<>*<>*<>*<>*<>*<>*<>*<>*<>*<>*<>*<>*<>*<>*<> <>*<>*<>*<>*<>*<>*<>*<>*<>*<>*<>*<>*<>*<>*<>*<>*<>*<>*<>*<>*<>*<>*<>*<>*<>  -  Test results slightly outside the reference range are not unusual. If there is anything important, I will review this with you,  otherwise it is considered normal test values.  If you have further questions,  please do not hesitate to contact me at the office or via My Chart.   <>*<>*<>*<>*<>*<>*<>*<>*<>*<>*<>*<>*<>*<>*<>*<>*<>*<>*<>*<>*<>*<>*<>*<>*<> <>*<>*<>*<>*<>*<>*<>*<>*<>*<>*<>*<>*<>*<>*<>*<>*<>*<>*<>*<>*<>*<>*<>*<>*<>  -  CBC show Red blood count has dropped down severely                                                                                   from 15.43 gm% to now 10.5 gm% -   - Call office to schedule office visit to come in to repeat blood work &                                                                                                    to check for bleeding ulcer.  - STOP MELOXICAM     !   <>*<>*<>*<>*<>*<>*<>*<>*<>*<>*<>*<>*<>*<>*<>*<>*<>*<>*<>*<>*<>*<>*<>*<>*<> <>*<>*<>*<>*<>*<>*<>*<>*<>*<>*<>*<>*<>*<>*<>*<>*<>*<>*<>*<>*<>*<>*<>*<>*<>  -   Magnesium  = 1.6 is   very  low  - goal is betw 2.0 - 2.5,   - So......Marland Kitchen  Recommend that you take Magnesium 500 mg tablet  2 x /day with meals   - also important to eat lots of  leafy green vegetables   - spinach - Kale - collards - greens - okra - asparagus   - broccoli - quinoa - squash - almonds   - black, red, white beans -  peas - green beans  <>*<>*<>*<>*<>*<>*<>*<>*<>*<>*<>*<>*<>*<>*<>*<>*<>*<>*<>*<>*<>*<>*<>*<>*<> <>*<>*<>*<>*<>*<>*<>*<>*<>*<>*<>*<>*<>*<>*<>*<>*<>*<>*<>*<>*<>*<>*<>*<>*<>  -  Chol = 123   &   LDL = 59  - Both   Excellent   - Very low risk for Heart Attack  / Stroke  <>*<>*<>*<>*<>*<>*<>*<>*<>*<>*<>*<>*<>*<>*<>*<>*<>*<>*<>*<>*<>*<>*<>*<>*<> <>*<>*<>*<>*<>*<>*<>*<>*<>*<>*<>*<>*<>*<>*<>*<>*<>*<>*<>*<>*<>*<>*<>*<>*<>  -  A1c  - is now back  in normal NON Diabetic range  - Great   !  <>*<>*<>*<>*<>*<>*<>*<>*<>*<>*<>*<>*<>*<>*<>*<>*<>*<>*<>*<>*<>*<>*<>*<>*<> <>*<>*<>*<>*<>*<>*<>*<>*<>*<>*<>*<>*<>*<>*<>*<>*<>*<>*<>*<>*<>*<>*<>*<>*<>  -  Vitamin D = 78  - Excellent - Please keep dose same   <>*<>*<>*<>*<>*<>*<>*<>*<>*<>*<>*<>*<>*<>*<>*<>*<>*<>*<>*<>*<>*<>*<>*<>*<> <>*<>*<>*<>*<>*<>*<>*<>*<>*<>*<>*<>*<>*<>*<>*<>*<>*<>*<>*<>*<>*<>*<>*<>*<>  -  Uric Acid  / Gout test is Normal & OK   <>*<>*<>*<>*<>*<>*<>*<>*<>*<>*<>*<>*<>*<>*<>*<>*<>*<>*<>*<>*<>*<>*<>*<>*<> <>*<>*<>*<>*<>*<>*<>*<>*<>*<>*<>*<>*<>*<>*<>*<>*<>*<>*<>*<>*<>*<>*<>*<>*<>  -  All Else  - Electrolytes - Liver - Magnesium & Thyroid    - all  Normal / OK  <>*<>*<>*<>*<>*<>*<>*<>*<>*<>*<>*<>*<>*<>*<>*<>*<>*<>*<>*<>*<>*<>*<>*<>*<> <>*<>*<>*<>*<>*<>*<>*<>*<>*<>*<>*<>*<>*<>*<>*<>*<>*<>*<>*<>*<>*<>*<>*<>*<>

## 2023-08-13 ENCOUNTER — Encounter: Payer: Self-pay | Admitting: Internal Medicine

## 2023-08-13 DIAGNOSIS — C029 Malignant neoplasm of tongue, unspecified: Secondary | ICD-10-CM | POA: Insufficient documentation

## 2023-08-15 ENCOUNTER — Ambulatory Visit (INDEPENDENT_AMBULATORY_CARE_PROVIDER_SITE_OTHER): Payer: Medicare Other

## 2023-08-15 VITALS — BP 122/64 | HR 59 | Temp 97.5°F | Ht 65.0 in | Wt 146.2 lb

## 2023-08-15 DIAGNOSIS — D509 Iron deficiency anemia, unspecified: Secondary | ICD-10-CM | POA: Diagnosis not present

## 2023-08-15 DIAGNOSIS — D500 Iron deficiency anemia secondary to blood loss (chronic): Secondary | ICD-10-CM | POA: Diagnosis not present

## 2023-08-16 ENCOUNTER — Other Ambulatory Visit: Payer: Self-pay | Admitting: Internal Medicine

## 2023-08-16 DIAGNOSIS — D649 Anemia, unspecified: Secondary | ICD-10-CM

## 2023-08-16 LAB — CBC WITH DIFFERENTIAL/PLATELET
Absolute Monocytes: 483 {cells}/uL (ref 200–950)
Basophils Absolute: 8 {cells}/uL (ref 0–200)
Basophils Relative: 0.2 %
Eosinophils Absolute: 71 {cells}/uL (ref 15–500)
Eosinophils Relative: 1.7 %
HCT: 30.9 % — ABNORMAL LOW (ref 38.5–50.0)
Hemoglobin: 10.4 g/dL — ABNORMAL LOW (ref 13.2–17.1)
Lymphs Abs: 907 {cells}/uL (ref 850–3900)
MCH: 34.3 pg — ABNORMAL HIGH (ref 27.0–33.0)
MCHC: 33.7 g/dL (ref 32.0–36.0)
MCV: 102 fL — ABNORMAL HIGH (ref 80.0–100.0)
MPV: 10.7 fL (ref 7.5–12.5)
Monocytes Relative: 11.5 %
Neutro Abs: 2730 {cells}/uL (ref 1500–7800)
Neutrophils Relative %: 65 %
Platelets: 139 10*3/uL — ABNORMAL LOW (ref 140–400)
RBC: 3.03 10*6/uL — ABNORMAL LOW (ref 4.20–5.80)
RDW: 15 % (ref 11.0–15.0)
Total Lymphocyte: 21.6 %
WBC: 4.2 10*3/uL (ref 3.8–10.8)

## 2023-08-16 LAB — RETICULOCYTES
ABS Retic: 45450 {cells}/uL (ref 25000–90000)
Retic Ct Pct: 1.5 %

## 2023-08-16 LAB — IRON,TIBC AND FERRITIN PANEL
%SAT: 32 % (ref 20–48)
Ferritin: 236 ng/mL (ref 24–380)
Iron: 98 ug/dL (ref 50–180)
TIBC: 308 ug/dL (ref 250–425)

## 2023-08-16 NOTE — Progress Notes (Signed)
<>*<>*<>*<>*<>*<>*<>*<>*<>*<>*<>*<>*<>*<>*<>*<>*<>*<>*<>*<>*<>*<>*<>*<>*<> <>*<>*<>*<>*<>*<>*<>*<>*<>*<>*<>*<>*<>*<>*<>*<>*<>*<>*<>*<>*<>*<>*<>*<>*<>  -   CBC show moderate severe anemia   - Red cell count is down about 30-35% .   So have requested referral for Hematology consult  <>*<>*<>*<>*<>*<>*<>*<>*<>*<>*<>*<>*<>*<>*<>*<>*<>*<>*<>*<>*<>*<>*<>*<>*<> <>*<>*<>*<>*<>*<>*<>*<>*<>*<>*<>*<>*<>*<>*<>*<>*<>*<>*<>*<>*<>*<>*<>*<>*<>

## 2023-08-22 ENCOUNTER — Other Ambulatory Visit: Payer: Self-pay | Admitting: Nurse Practitioner

## 2023-08-22 DIAGNOSIS — G2581 Restless legs syndrome: Secondary | ICD-10-CM

## 2023-08-24 ENCOUNTER — Encounter: Payer: Self-pay | Admitting: Internal Medicine

## 2023-08-31 ENCOUNTER — Telehealth: Payer: Self-pay | Admitting: Nurse Practitioner

## 2023-08-31 ENCOUNTER — Other Ambulatory Visit: Payer: Self-pay | Admitting: Gastroenterology

## 2023-08-31 ENCOUNTER — Other Ambulatory Visit: Payer: Self-pay | Admitting: Nurse Practitioner

## 2023-08-31 DIAGNOSIS — G2581 Restless legs syndrome: Secondary | ICD-10-CM

## 2023-08-31 DIAGNOSIS — K7460 Unspecified cirrhosis of liver: Secondary | ICD-10-CM

## 2023-08-31 MED ORDER — CLONAZEPAM 1 MG PO TABS
ORAL_TABLET | ORAL | 0 refills | Status: DC
Start: 2023-08-31 — End: 2023-10-10

## 2023-08-31 NOTE — Telephone Encounter (Signed)
Refill request -- [facility] -- [facility], [address]

## 2023-09-12 ENCOUNTER — Ambulatory Visit
Admission: RE | Admit: 2023-09-12 | Discharge: 2023-09-12 | Disposition: A | Discharge status: Home / Self Care | Payer: Medicare Other | Source: Ambulatory Visit | Attending: Gastroenterology

## 2023-09-12 DIAGNOSIS — K7460 Unspecified cirrhosis of liver: Secondary | ICD-10-CM

## 2023-09-21 DIAGNOSIS — N281 Cyst of kidney, acquired: Secondary | ICD-10-CM | POA: Diagnosis not present

## 2023-09-21 DIAGNOSIS — K118 Other diseases of salivary glands: Secondary | ICD-10-CM | POA: Diagnosis not present

## 2023-09-21 DIAGNOSIS — K5730 Diverticulosis of large intestine without perforation or abscess without bleeding: Secondary | ICD-10-CM | POA: Diagnosis not present

## 2023-09-21 DIAGNOSIS — J439 Emphysema, unspecified: Secondary | ICD-10-CM | POA: Diagnosis not present

## 2023-09-21 DIAGNOSIS — C108 Malignant neoplasm of overlapping sites of oropharynx: Secondary | ICD-10-CM | POA: Diagnosis not present

## 2023-09-21 DIAGNOSIS — I700 Atherosclerosis of aorta: Secondary | ICD-10-CM | POA: Diagnosis not present

## 2023-09-21 DIAGNOSIS — I6523 Occlusion and stenosis of bilateral carotid arteries: Secondary | ICD-10-CM | POA: Diagnosis not present

## 2023-09-21 DIAGNOSIS — I2510 Atherosclerotic heart disease of native coronary artery without angina pectoris: Secondary | ICD-10-CM | POA: Diagnosis not present

## 2023-10-06 ENCOUNTER — Other Ambulatory Visit: Payer: Self-pay | Admitting: Nurse Practitioner

## 2023-10-10 ENCOUNTER — Other Ambulatory Visit: Payer: Self-pay | Admitting: Nurse Practitioner

## 2023-10-10 DIAGNOSIS — G2581 Restless legs syndrome: Secondary | ICD-10-CM

## 2023-10-17 DIAGNOSIS — Z95828 Presence of other vascular implants and grafts: Secondary | ICD-10-CM | POA: Diagnosis not present

## 2023-10-17 DIAGNOSIS — D490 Neoplasm of unspecified behavior of digestive system: Secondary | ICD-10-CM | POA: Diagnosis not present

## 2023-10-17 DIAGNOSIS — T451X5A Adverse effect of antineoplastic and immunosuppressive drugs, initial encounter: Secondary | ICD-10-CM | POA: Diagnosis not present

## 2023-10-17 DIAGNOSIS — Z510 Encounter for antineoplastic radiation therapy: Secondary | ICD-10-CM | POA: Diagnosis not present

## 2023-10-17 DIAGNOSIS — D649 Anemia, unspecified: Secondary | ICD-10-CM | POA: Diagnosis not present

## 2023-10-17 DIAGNOSIS — C108 Malignant neoplasm of overlapping sites of oropharynx: Secondary | ICD-10-CM | POA: Diagnosis not present

## 2023-10-17 DIAGNOSIS — D701 Agranulocytosis secondary to cancer chemotherapy: Secondary | ICD-10-CM | POA: Diagnosis not present

## 2023-10-17 DIAGNOSIS — C109 Malignant neoplasm of oropharynx, unspecified: Secondary | ICD-10-CM | POA: Diagnosis not present

## 2023-10-18 DIAGNOSIS — C109 Malignant neoplasm of oropharynx, unspecified: Secondary | ICD-10-CM | POA: Diagnosis not present

## 2023-10-18 DIAGNOSIS — Z8711 Personal history of peptic ulcer disease: Secondary | ICD-10-CM | POA: Diagnosis not present

## 2023-10-18 DIAGNOSIS — A048 Other specified bacterial intestinal infections: Secondary | ICD-10-CM | POA: Diagnosis not present

## 2023-11-10 ENCOUNTER — Encounter: Payer: Self-pay | Admitting: Nurse Practitioner

## 2023-11-10 ENCOUNTER — Ambulatory Visit (INDEPENDENT_AMBULATORY_CARE_PROVIDER_SITE_OTHER): Payer: Medicare Other | Admitting: Nurse Practitioner

## 2023-11-10 VITALS — BP 156/78 | HR 60 | Temp 98.0°F | Ht 65.0 in | Wt 152.8 lb

## 2023-11-10 DIAGNOSIS — M1000 Idiopathic gout, unspecified site: Secondary | ICD-10-CM | POA: Diagnosis not present

## 2023-11-10 DIAGNOSIS — G5601 Carpal tunnel syndrome, right upper limb: Secondary | ICD-10-CM

## 2023-11-10 DIAGNOSIS — R6889 Other general symptoms and signs: Secondary | ICD-10-CM

## 2023-11-10 DIAGNOSIS — G4733 Obstructive sleep apnea (adult) (pediatric): Secondary | ICD-10-CM | POA: Diagnosis not present

## 2023-11-10 DIAGNOSIS — N1831 Chronic kidney disease, stage 3a: Secondary | ICD-10-CM | POA: Diagnosis not present

## 2023-11-10 DIAGNOSIS — Z79899 Other long term (current) drug therapy: Secondary | ICD-10-CM

## 2023-11-10 DIAGNOSIS — I10 Essential (primary) hypertension: Secondary | ICD-10-CM | POA: Diagnosis not present

## 2023-11-10 DIAGNOSIS — I700 Atherosclerosis of aorta: Secondary | ICD-10-CM | POA: Diagnosis not present

## 2023-11-10 DIAGNOSIS — Z0000 Encounter for general adult medical examination without abnormal findings: Secondary | ICD-10-CM

## 2023-11-10 DIAGNOSIS — Z0001 Encounter for general adult medical examination with abnormal findings: Secondary | ICD-10-CM | POA: Diagnosis not present

## 2023-11-10 DIAGNOSIS — R7309 Other abnormal glucose: Secondary | ICD-10-CM

## 2023-11-10 DIAGNOSIS — E559 Vitamin D deficiency, unspecified: Secondary | ICD-10-CM | POA: Diagnosis not present

## 2023-11-10 DIAGNOSIS — E782 Mixed hyperlipidemia: Secondary | ICD-10-CM

## 2023-11-10 DIAGNOSIS — M153 Secondary multiple arthritis: Secondary | ICD-10-CM

## 2023-11-10 DIAGNOSIS — J449 Chronic obstructive pulmonary disease, unspecified: Secondary | ICD-10-CM | POA: Diagnosis not present

## 2023-11-10 NOTE — Patient Instructions (Signed)

## 2023-11-10 NOTE — Progress Notes (Signed)
MEDICARE WELLNESS Assessment:   Medicare annual wellness visit, subsequent Due annually  Health maintenance reviewed Healthily lifestyle goals set  Aortic atherosclerosis (HCC) by Chest CT on 12.18/2020 Continue statin. Lifestyle modifications. Weight loss, dietary modifications.  OSA and COPD overlap syndrome (HCC) Continue CPAP  Stage 3a chronic kidney disease (HCC) Stay well hydrated. Avoid high salt foods. Avoid NSAIDS. Keep BP and BG well controlled.   Take medications as prescribed. Remain active and exercise as tolerated daily. Maintain weight.  Continue to monitor. Check CMP/GFR/Microablumin  Morbid obesity (HCC) Discussed appropriate BMI Diet modification. Physical activity. Encouraged/praised to build confidence.  Essential hypertension Discussed DASH (Dietary Approaches to Stop Hypertension) DASH diet is lower in sodium than a typical American diet. Cut back on foods that are high in saturated fat, cholesterol, and trans fats. Eat more whole-grain foods, fish, poultry, and nuts Remain active and exercise as tolerated daily.  Monitor BP at home-Call if greater than 130/80.  Check CMP/CBC  Carpal tunnel syndrome, right upper limb Continue current pain management Gabapentin, Norco, Meloxicam  Osteoarthritis, unspecified osteoarthritis type, unspecified site Weight loss advised. Continue current pain management  Gabapentin,  Norco, Meloxicam  Hyperlipidemia, mixed Discussed lifestyle modifications. Recommended diet heavy in fruits and veggies, omega 3's. Decrease consumption of animal meats, cheeses, and dairy products. Remain active and exercise as tolerated. Continue to monitor. Check lipids/TSH  Vitamin D deficiency Continue supplement for goal of 60-100 Monitor Vitamin D levels  Idiopathic gout, unspecified chronicity, unspecified site Discussed low purine diet. Possible restart of Allopurinol if uric acid levels  elevated. Decadron  Abnormal glucose Education: Reviewed 'ABCs' of diabetes management  Discussed goals to be met and/or maintained include A1C (<7) Blood pressure (<130/80) Cholesterol (LDL <70) Continue Eye Exam yearly  Continue Dental Exam Q6 mo Discussed dietary recommendations Discussed Physical Activity recommendations Foot exam UTD Check A1C  Medication management All medications discussed and reviewed in full. All questions and concerns regarding medications addressed.    Orders Placed This Encounter  Procedures   CBC with Differential/Platelet   COMPLETE METABOLIC PANEL WITH GFR   Lipid panel   Hemoglobin A1c   Over 30 minutes of exam, counseling, chart review, and critical decision making was performed Future Appointments  Date Time Provider Department Center  02/09/2024 10:00 AM Lucky Cowboy, MD [facility] None  06/14/2024 11:00 AM Butch Penny, NP [facility] None  11/12/2024 11:00 AM Adela Glimpse, NP [facility] None    Plan:   During the course of the visit the patient was educated and counseled about appropriate screening and preventive services including:   Pneumococcal vaccine  Influenza vaccine Prevnar 13 Td vaccine Screening electrocardiogram Colorectal cancer screening Diabetes screening Glaucoma screening Nutrition counseling    Subjective:  Stephen Holland is a 72 y.o. male who presents for Medicare Annual Wellness Visit and 3 month follow up for HTN, hyperlipidemia, prediabetes, and vitamin D Def.   Overall he reports feeling well today.  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 He lives with his wife of several years.  He has been following with Oncology, Dr. 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  Initial consultation on 04/04/23 for possible base of tongue malignancy. Began experiencing a sore throat with pain radiating to the ear and head in November 2023. He also  began noticing difficulty and pain while swallowing and difficulty speaking. He has lost about 30 pounds unintentionally. He sought medical attention earlier part of  this year and on 01/13/2023 underwent a barium swallow study which showed no evidence of aspiration. On 03/01/2023 he underwent a CT scan of the neck with contrast which showed a masslike enhancement at the base of the tongue measuring up to 3.4 x 2 cm. There is a 1.8 x 1.5 cm enhancing mass in the right parotid gland without any evidence of lymphadenopathy in the neck. MRI scan of the brain completed on 03/01/2023 did not reveal any evidence of brain metastasis or other space-occupying lesions. There was no evidence of a CVA or other acute abnormalities.   He was seen by Dr. Ernestene Kiel, ENT surgeon in [place], [place]. He underwent direct laryngoscopic examination on 03/15/2023 which revealed prominence of the base of the tongue and a small nodule in the inferior part of the epiglottis. Biopsy completed on 03/24/2023 showed squamous atypia without any evidence of invasive carcinoma.  He was seen and evaluated by Dr. Dorothyann Gibbs in radiation oncology on 03/31/2023 in [place], [place], on 03/17/2023. PET CT scan completed on 03/21/2023 showed bilateral base of tongue masses with a maximum SUV of 19.5. There was no evidence of cervical or mediastinal lymphadenopathy. There is a 1.7 cm soft tissue lesion in the right parotid gland with a maximum SUV of 13.5 and a 6 x 4 mm lesion of the left inferior parotid gland. There is no evidence of distant metastases.  Mr. Fintel was referred to see Dr. Lendell Caprice, head and neck cancer surgeon in [place],   He is a tobacco smoker and has smoked at least a pack a day for over 40 years. He drinks at least 3 alcoholic beverages every day for over 30 years. Does not use any recreational drugs.   He completed concurrent chemoradiation therapy with completion of 7 weekly doses of cisplatin at 40  mg/m on 05/09/2023 concurrent radiation therapy treatments on 06/27/2023 without experiencing any major complications. He has received periodic intravenous fluid support in the outpatient setting.  Post treatment examination and PET/CT scan completed on 09/21/2023 showed marked improvement in the hypermetabolic mass along the floor of the tongue with faint linear hypermetabolic activity (maximum SUV activity 5.1) previously the mass in this region had SUV activity of 19.5. The right parotid nodule has an SUV activity of 6.9 measuring 9 mm which has significantly improved. The left parotid lesion has completely resolved. There is no evidence of metastatic disease.   Reports decline in hearing, however, he is not interested  in getting ears checked   Reports a gout attack 2 days ago.  He is no longer taking Allopurinol.  Takes meloxicam and Norco PRN.  Trying to monitor diet.  Has history of back pain/neck pain with DDD, has been getting injections, not helping, suggested surgery but the patient is not interested.   He has COPD, not on meds at this time. He has OSA.   HE was having DOE, saw cardiology, had CT coronary 04/2020, showed non obstructive CAD.  He was having proteinuria, had normal protein electrophoresis urine and normal work up, he is on ARB.   Right carapel tunnel, follows up on 03/30/22.  His blood pressure has been controlled at home, today their BP is    He does not workout. He denies chest pain, shortness of breath, dizziness. BMI is Body mass index is 25.43 kg/m. He admits to eating too much.   Wt Readings from Last 3 Encounters:  11/10/23 152 lb 12.8 oz (69.3 kg)  08/15/23 146 lb 3.2 oz (  66.3 kg)  08/11/23 146 lb 6.4 oz (66.4 kg)   Takes klonopin for sleep but will take every other day.   He is on cholesterol medication and denies myalgias. His cholesterol is at goal however triglycerides are elevated. The cholesterol last visit was:   Lab Results  Component Value Date    CHOL 123 08/11/2023   HDL 43 08/11/2023   LDLCALC 59 08/11/2023   TRIG 125 08/11/2023   CHOLHDL 2.9 08/11/2023    He has been working on diet and exercise for prediabetes, and denies paresthesia of the feet, polydipsia, polyuria and visual disturbances.  Last A1C in the office was:  Lab Results  Component Value Date   HGBA1C 5.0 08/11/2023   Lab Results  Component Value Date   GFRNONAA 55 (L) 03/24/2023   Patient is on Vitamin D supplement.   Lab Results  Component Value Date   VD25OH 78 08/11/2023     Patient is on allopurinol for gout and  reports a recent flare.  Lab Results  Component Value Date   LABURIC 5.8 08/11/2023   Medication Review:   Current Outpatient Medications (Cardiovascular):    bisoprolol (ZEBETA) 10 MG tablet, TAKE 1 TABLET BY MOUTH EVERY DAY FOR BLOOD PRESSURE   rosuvastatin (CRESTOR) 40 MG tablet, TAKE 1 TABLET BY MOUTH EVERY DAY FOR CHOLESTEROL   telmisartan (MICARDIS) 80 MG tablet, TAKE 1 TABLET BY MOUTH EVERY DAY FOR BLOOD PRESSURE (Patient taking differently: Take 80 mg by mouth every evening. FOR BLOOD PRESSURE)   Current Outpatient Medications (Analgesics):    meloxicam (MOBIC) 15 MG tablet, TAKE 1 TABLET BY MOUTH DAILY AS NEEDED WITH FOOD FOR PAIN & INFLAMMATION (Patient taking differently: Take 15 mg by mouth in the morning.)   Current Outpatient Medications (Other):    Cholecalciferol (VITAMIN D3) 125 MCG (5000 UT) CAPS, Take 10,000 Units by mouth See admin instructions. Take tues., thurs., Sat., Sun.   clonazePAM (KLONOPIN) 1 MG tablet, 1/2- 1 TAB AT BEDTIME FOR RESTLESS LEGS AS NEEDED   gabapentin (NEURONTIN) 300 MG capsule, Take 300 mg by mouth 2 (two) times daily.   KRILL OIL PO, Take 750 mg by mouth every morning. Mega Red   MAGNESIUM PO, Take 500 mg by mouth daily.   pantoprazole (PROTONIX) 40 MG tablet, TAKE 1 TABLET BY MOUTH EVERY DAY  Current Problems (verified) Patient Active Problem List   Diagnosis Date Noted   Tongue  cancer [facility])   April 2024 s/p Chemo therapy & Radiotherapy 08/13/2023   Carpal tunnel syndrome, right upper limb 02/24/2022   Abnormal CT of liver 04/24/2021   CKD (chronic kidney disease), stage III (HCC) 03/03/2021   Septic olecranon bursitis of left elbow    Chronic intermittent hypoxia with obstructive sleep apnea 02/10/2021   PLMD (periodic limb movement disorder) 01/08/2021   CPAP (continuous positive airway pressure) dependence 01/08/2021   Neuropathy 01/08/2021   Coronary artery calcification seen on CT scan 03/19/2020   Persistent proteinuria 02/22/2020   Aortic atherosclerosis (HCC) by Chest CT on 12.18/2020 06/30/2017   COPD (chronic obstructive pulmonary disease) (HCC) 06/13/2017   Morbid obesity (HCC) 08/26/2014   Former smoker (45+ pack year history, quit 02/2021) 08/26/2014   Essential hypertension    Hyperlipidemia, mixed    Abnormal glucose    OSA and COPD overlap syndrome (HCC)    Vitamin D deficiency    DJD (degenerative joint disease)    Primary gout    ED (erectile dysfunction)     Screening Tests  Immunization History  Administered Date(s) Administered   Fluad Quad(high Dose 65+) 10/03/2020   Influenza Split 08/26/2014   Influenza, High Dose Seasonal PF 08/12/2016, 10/17/2018, 09/05/2019, 09/30/2021, 10/14/2022, 08/11/2023   Influenza, Seasonal, Injecte, Preservative Fre 10/27/2015   PFIZER(Purple Top)SARS-COV-2 Vaccination 01/05/2020, 01/26/2020, 09/27/2020   PPD Test 05/23/2014, 07/23/2015   Pneumococcal Conjugate-13 08/26/2014   Pneumococcal Polysaccharide-23 05/05/2010, 01/30/2016   Td 11/30/2003   Tdap 08/12/2010, 08/21/2020, 03/01/2021   Zoster, Live 04/29/2013   Health Maintenance  Topic Date Due   Zoster Vaccines- Shingrix (1 of 2) 01/07/1970   Lung Cancer Screening  04/22/2022   COVID-19 Vaccine (4 - 2024-25 season) 07/31/2023   Medicare Annual Wellness (AWV)  11/09/2024   Colonoscopy  09/06/2026   DTaP/Tdap/Td (5 - Td or Tdap) 03/02/2031    Pneumonia Vaccine 60+ Years old  Completed   INFLUENZA VACCINE  Completed   Hepatitis C Screening  Completed   HPV VACCINES  Aged Out   Fecal DNA (Cologuard)  Discontinued    Preventative care: Last colonoscopy: 08/2016 CT chest 10/2021 . Continue annual screening with low-dose chest  Prior vaccinations: TD or Tdap: 2011 Defers  Influenza: 07/2023 Pneumococcal: 2011 Prevnar13: 2015 Shingles/Zostavax: 2014 Defers  Names of Other Physician/Practitioners you currently use: 1. [facility] here for primary care 2. [employer], Librarian, academic, last visit 2016 3. Dr. Vladimir Crofts and Azucena Cecil, dentist, last visit 2018 q 6 months Patient Care Team: Lucky Cowboy, MD as PCP - General (Internal Medicine) Charlett Nose, [facility] (Inactive) as Pharmacist (Pharmacist)  Allergies Allergies  Allergen Reactions   Allopurinol Rash    SURGICAL HISTORY He  has a past surgical history that includes Prostatectomy (2003); colonscopy; Colonoscopy with propofol (N/A, 09/06/2016); Skin cancer excision; Cataract extraction, bilateral (Bilateral, 12/2020); and Direct laryngoscopy (N/A, 03/24/2023). FAMILY HISTORY His family history includes Colon cancer in his paternal grandfather; Heart attack in his maternal grandmother; Hypertension in his mother; Pulmonary fibrosis in his mother; Stroke in his father, maternal grandfather, and paternal grandmother. SOCIAL HISTORY He  reports that he has been smoking cigarettes. He has a 55 pack-year smoking history. He has never used smokeless tobacco. He reports that he does not currently use alcohol after a past usage of about 7.0 - 14.0 standard drinks of alcohol per week. He reports that he does not use drugs.  MEDICARE WELLNESS OBJECTIVES: Physical activity: Current Exercise Habits: The patient does not participate in regular exercise at present Cardiac risk factors:   Depression/mood screen:      11/10/2023   11:34 AM  Depression  screen PHQ 2/9  Decreased Interest 0  Down, Depressed, Hopeless 0  PHQ - 2 Score 0    ADLs:     11/10/2023   11:34 AM 08/13/2023   10:40 PM  In your present state of health, do you have any difficulty performing the following activities:  Hearing? 0 0  Vision? 0 0  Difficulty concentrating or making decisions? 0 0  Walking or climbing stairs? 0 0  Dressing or bathing? 0 0  Doing errands, shopping? 0 0     Cognitive Testing  Alert? Yes  Normal Appearance?Yes  Oriented to person? Yes  Place? Yes   Time? Yes  Recall of three objects?  Yes  Can perform simple calculations? Yes  Displays appropriate judgment?Yes  Can read the correct time from a watch face?Yes  EOL planning:     Objective:   Today's Vitals   11/10/23 1051  Pulse: 60  Temp:  98 F (36.7 C)  SpO2: 97%  Weight: 152 lb 12.8 oz (69.3 kg)  Height: 5\' 5"  (1.651 m)    Body mass index is 25.43 kg/m.    General appearance: alert, no distress, WD/WN, male HEENT: normocephalic, sclerae anicteric, TMs pearly, nares patent, no discharge or erythema, pharynx normal Oral cavity: MMM, no lesions Neck: supple, no lymphadenopathy, no thyromegaly, no masses Heart: RRR, normal S1, S2, no murmurs Lungs: CTA bilaterally, no wheezes, rhonchi, or rales Abdomen: +bs, soft, obese non tender, non distended, no masses, no hepatomegaly, no splenomegaly Musculoskeletal: nontender, no swelling, no obvious deformity Extremities: no edema, no cyanosis, no clubbing Pulses: 2+ symmetric, upper and lower extremities, normal cap refill Neurological: alert, oriented x 3, CN2-12 intact, strength normal upper extremities and lower extremities, sensation normal throughout, DTRs 2+ throughout, no cerebellar signs, gait normal Psychiatric: normal affect, behavior normal, pleasant   Medicare Attestation I have personally reviewed: The patient's medical and social history Their use of alcohol, tobacco or illicit drugs Their current  medications and supplements The patient's functional ability including ADLs,fall risks, home safety risks, cognitive, and hearing and visual impairment Diet and physical activities Evidence for depression or mood disorders  The patient's weight, height, BMI, and visual acuity have been recorded in the chart.  I have made referrals, counseling, and provided education to the patient based on review of the above and I have provided the patient with a written personalized care plan for preventive services.     Adela Glimpse, NP   11/10/2023

## 2023-11-11 LAB — CBC WITH DIFFERENTIAL/PLATELET
Absolute Lymphocytes: 1349 {cells}/uL (ref 850–3900)
Absolute Monocytes: 611 {cells}/uL (ref 200–950)
Basophils Absolute: 57 {cells}/uL (ref 0–200)
Basophils Relative: 0.8 %
Eosinophils Absolute: 92 {cells}/uL (ref 15–500)
Eosinophils Relative: 1.3 %
HCT: 40.6 % (ref 38.5–50.0)
Hemoglobin: 13.5 g/dL (ref 13.2–17.1)
MCH: 32.8 pg (ref 27.0–33.0)
MCHC: 33.3 g/dL (ref 32.0–36.0)
MCV: 98.5 fL (ref 80.0–100.0)
MPV: 10.6 fL (ref 7.5–12.5)
Monocytes Relative: 8.6 %
Neutro Abs: 4991 {cells}/uL (ref 1500–7800)
Neutrophils Relative %: 70.3 %
Platelets: 144 10*3/uL (ref 140–400)
RBC: 4.12 10*6/uL — ABNORMAL LOW (ref 4.20–5.80)
RDW: 11.9 % (ref 11.0–15.0)
Total Lymphocyte: 19 %
WBC: 7.1 10*3/uL (ref 3.8–10.8)

## 2023-11-11 LAB — COMPLETE METABOLIC PANEL WITH GFR
AG Ratio: 1.9 (calc) (ref 1.0–2.5)
ALT: 20 U/L (ref 9–46)
AST: 26 U/L (ref 10–35)
Albumin: 4.1 g/dL (ref 3.6–5.1)
Alkaline phosphatase (APISO): 62 U/L (ref 35–144)
BUN/Creatinine Ratio: 20 (calc) (ref 6–22)
BUN: 30 mg/dL — ABNORMAL HIGH (ref 7–25)
CO2: 30 mmol/L (ref 20–32)
Calcium: 9.6 mg/dL (ref 8.6–10.3)
Chloride: 107 mmol/L (ref 98–110)
Creat: 1.52 mg/dL — ABNORMAL HIGH (ref 0.70–1.28)
Globulin: 2.2 g/dL (ref 1.9–3.7)
Glucose, Bld: 67 mg/dL (ref 65–99)
Potassium: 5.2 mmol/L (ref 3.5–5.3)
Sodium: 143 mmol/L (ref 135–146)
Total Bilirubin: 0.5 mg/dL (ref 0.2–1.2)
Total Protein: 6.3 g/dL (ref 6.1–8.1)
eGFR: 48 mL/min/{1.73_m2} — ABNORMAL LOW (ref >=60)

## 2023-11-11 LAB — LIPID PANEL
Cholesterol: 121 mg/dL (ref <200)
HDL: 50 mg/dL (ref >=40)
LDL Cholesterol (Calc): 58 mg/dL
Non-HDL Cholesterol (Calc): 71 mg/dL (ref <130)
Total CHOL/HDL Ratio: 2.4 (calc) (ref <5.0)
Triglycerides: 57 mg/dL (ref <150)

## 2023-11-11 LAB — HEMOGLOBIN A1C
Hgb A1c MFr Bld: 5.3 %{Hb} (ref <5.7)
Mean Plasma Glucose: 105 mg/dL
eAG (mmol/L): 5.8 mmol/L

## 2023-11-16 DIAGNOSIS — D225 Melanocytic nevi of trunk: Secondary | ICD-10-CM | POA: Diagnosis not present

## 2023-11-16 DIAGNOSIS — L814 Other melanin hyperpigmentation: Secondary | ICD-10-CM | POA: Diagnosis not present

## 2023-11-16 DIAGNOSIS — L821 Other seborrheic keratosis: Secondary | ICD-10-CM | POA: Diagnosis not present

## 2023-11-16 DIAGNOSIS — D485 Neoplasm of uncertain behavior of skin: Secondary | ICD-10-CM | POA: Diagnosis not present

## 2023-11-16 DIAGNOSIS — L578 Other skin changes due to chronic exposure to nonionizing radiation: Secondary | ICD-10-CM | POA: Diagnosis not present

## 2023-11-16 DIAGNOSIS — C4442 Squamous cell carcinoma of skin of scalp and neck: Secondary | ICD-10-CM | POA: Diagnosis not present

## 2023-11-16 DIAGNOSIS — D049 Carcinoma in situ of skin, unspecified: Secondary | ICD-10-CM | POA: Diagnosis not present

## 2023-11-16 DIAGNOSIS — C4492 Squamous cell carcinoma of skin, unspecified: Secondary | ICD-10-CM | POA: Diagnosis not present

## 2023-11-16 DIAGNOSIS — Z85828 Personal history of other malignant neoplasm of skin: Secondary | ICD-10-CM | POA: Diagnosis not present

## 2023-11-28 DIAGNOSIS — J439 Emphysema, unspecified: Secondary | ICD-10-CM | POA: Diagnosis not present

## 2023-11-28 DIAGNOSIS — C109 Malignant neoplasm of oropharynx, unspecified: Secondary | ICD-10-CM | POA: Diagnosis not present

## 2023-11-28 DIAGNOSIS — K118 Other diseases of salivary glands: Secondary | ICD-10-CM | POA: Diagnosis not present

## 2023-11-28 DIAGNOSIS — J384 Edema of larynx: Secondary | ICD-10-CM | POA: Diagnosis not present

## 2023-12-05 ENCOUNTER — Other Ambulatory Visit: Payer: Self-pay | Admitting: Nurse Practitioner

## 2023-12-05 DIAGNOSIS — G2581 Restless legs syndrome: Secondary | ICD-10-CM

## 2023-12-06 DIAGNOSIS — F17210 Nicotine dependence, cigarettes, uncomplicated: Secondary | ICD-10-CM | POA: Diagnosis not present

## 2023-12-06 DIAGNOSIS — C108 Malignant neoplasm of overlapping sites of oropharynx: Secondary | ICD-10-CM | POA: Diagnosis not present

## 2023-12-06 DIAGNOSIS — J387 Other diseases of larynx: Secondary | ICD-10-CM | POA: Diagnosis not present

## 2023-12-22 DIAGNOSIS — C108 Malignant neoplasm of overlapping sites of oropharynx: Secondary | ICD-10-CM | POA: Diagnosis not present

## 2023-12-22 DIAGNOSIS — Z452 Encounter for adjustment and management of vascular access device: Secondary | ICD-10-CM | POA: Diagnosis not present

## 2024-01-02 DIAGNOSIS — C4442 Squamous cell carcinoma of skin of scalp and neck: Secondary | ICD-10-CM | POA: Diagnosis not present

## 2024-01-02 DIAGNOSIS — D485 Neoplasm of uncertain behavior of skin: Secondary | ICD-10-CM | POA: Diagnosis not present

## 2024-01-11 ENCOUNTER — Other Ambulatory Visit: Payer: Self-pay

## 2024-01-11 DIAGNOSIS — G2581 Restless legs syndrome: Secondary | ICD-10-CM

## 2024-01-11 MED ORDER — GABAPENTIN 300 MG PO CAPS: 300.0000 mg | ORAL_CAPSULE | Freq: Two times a day (BID) | ORAL | 0 refills | Status: DC

## 2024-01-11 MED ORDER — CLONAZEPAM 1 MG PO TABS: ORAL_TABLET | ORAL | 0 refills | Status: DC

## 2024-01-16 DIAGNOSIS — C4442 Squamous cell carcinoma of skin of scalp and neck: Secondary | ICD-10-CM | POA: Diagnosis not present

## 2024-01-16 DIAGNOSIS — L570 Actinic keratosis: Secondary | ICD-10-CM | POA: Diagnosis not present

## 2024-01-26 ENCOUNTER — Encounter: Payer: Self-pay | Admitting: Physician Assistant

## 2024-01-26 NOTE — Progress Notes (Unsigned)
 New patient visit   Patient: Stephen Holland   DOB: 1951/10/03   73 y.o. Male  MRN: [id number] Visit Date: 01/27/2024  Today's healthcare provider: Alfredia Ferguson, PA-C   No chief complaint on file.  Subjective    Stephen Holland is a 73 y.o. male w/ a PMH of HTN, HLD, restless leg syndrome, tongue cancer, who presents today as a new patient to establish care.   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 Still smokes. Still drinks alcohol.   Former smoker? Current?  Past Medical History:  Diagnosis Date   AKI (acute kidney injury) (HCC) 03/01/2021   Anemia    RBC low during chemo treatment   Cataract    removed 2023   CKD (chronic kidney disease) stage 3, GFR 30-59 ml/min (HCC) 02/02/2023   stage 3a - GFR 48   Colon polyps    DJD (degenerative joint disease)    Dyspnea    with exertion   ED (erectile dysfunction)    Emphysema of lung (HCC)    GERD (gastroesophageal reflux disease)    Gout    Hyperlipemia    Hypertension    Obstructive sleep apnea    08/2012, cpap setting of 14   Prostate cancer (HCC) 01/2002   s/p prostatectomy   Sleep apnea    Ulcer    diagnosed 2024   Vitamin D deficiency    Past Surgical History:  Procedure Laterality Date   CATARACT EXTRACTION, BILATERAL Bilateral 12/2020   Dr. Darel Hong   COLONOSCOPY WITH PROPOFOL N/A 09/06/2016   Procedure: COLONOSCOPY WITH PROPOFOL;  Surgeon: Charolett Bumpers, MD;  Location: [facility];  Service: Endoscopy;  Laterality: N/A;   colonscopy     x 3 with polyp one time   DIRECT LARYNGOSCOPY N/A 03/24/2023   Procedure: DIRECT LARYNGOSCOPY WITH BIOPSIES;  Surgeon: Scarlette Ar, MD;  Location: [facility] OR;  Service: ENT;  Laterality: N/A;   EYE SURGERY     cataract 2023   PROSTATECTOMY  2003   SKIN CANCER EXCISION     Family Status  Relation Name  Status   Mother  Deceased at age 35   Father  Deceased at age 56   MGM  (Not Specified)   MGF  (Not Specified)   PGM  (Not Specified)   PGF  (Not Specified)  No partnership data on file   Family History  Problem Relation Age of Onset   Hypertension Mother    Pulmonary fibrosis Mother    Stroke Father    Heart attack Maternal Grandmother    Stroke Maternal Grandfather    Stroke Paternal Grandmother    Colon cancer Paternal Grandfather    Social History   Socioeconomic History   Marital status: Married    Spouse name: [name]   Number of children: 2   Years of education: 12   Highest education level: 12th grade  Occupational History    Comment: truck driver  Tobacco Use   Smoking status: Every Day    Current packs/day: 1.00    Average packs/day: 1 pack/day for 55.0 years (55.0 ttl pk-yrs)    Types: Cigarettes   Smokeless tobacco: Never   Tobacco comments:    Have quit several times and restarted  Vaping Use   Vaping status: Never Used  Substance and Sexual Activity   Alcohol  use: Yes    Alcohol/week: 7.0 - 14.0 standard drinks of alcohol    Types: 7 - 14 Shots of liquor per week    Comment: 03/23/23:  1-2 mixed drinks per day   Drug use: No   Sexual activity: Not Currently  Other Topics Concern   Not on file  Social History Narrative   Lives home with wife. Retired.  Education HS.  Children 2     Caffeine 6 cups coffee daily, 2-3 glasses of tea and one coke per day   Social Drivers of Health   Financial Resource Strain: Low Risk  (01/20/2024)   Overall Financial Resource Strain (CARDIA)    Difficulty of Paying Living Expenses: Not hard at all  Food Insecurity: No Food Insecurity (01/20/2024)   Hunger Vital Sign    Worried About Running Out of Food in the Last Year: Never true    Ran Out of Food in the Last Year: Never true  Transportation Needs: No Transportation Needs (01/20/2024)   PRAPARE - Administrator, Civil Service (Medical): No    Lack of  Transportation (Non-Medical): No  Physical Activity: Unknown (01/20/2024)   Exercise Vital Sign    Days of Exercise per Week: 0 days    Minutes of Exercise per Session: Not on file  Stress: No Stress Concern Present (01/20/2024)   [employer] of Occupational Health - Occupational Stress Questionnaire    Feeling of Stress : Not at all  Social Connections: Moderately Integrated (01/20/2024)   Social Connection and Isolation Panel [NHANES]    Frequency of Communication with Friends and Family: More than three times a week    Frequency of Social Gatherings with Friends and Family: Once a week    Attends Religious Services: More than 4 times per year    Active Member of [employer] or Organizations: No    Attends Engineer, structural: Not on file    Marital Status: Married   Outpatient Medications Prior to Visit  Medication Sig   bisoprolol (ZEBETA) 10 MG tablet TAKE 1 TABLET BY MOUTH EVERY DAY FOR BLOOD PRESSURE   Cholecalciferol (VITAMIN D3) 125 MCG (5000 UT) CAPS Take 10,000 Units by mouth See admin instructions. Take tues., thurs., Sat., Sun.   clonazePAM (KLONOPIN) 1 MG tablet 1/2- 1 tab at bedtime for restless legs as needed   gabapentin (NEURONTIN) 300 MG capsule Take 1 capsule (300 mg total) by mouth 2 (two) times daily.   KRILL OIL PO Take 750 mg by mouth every morning. Mega Red   MAGNESIUM PO Take 500 mg by mouth daily.   meloxicam (MOBIC) 15 MG tablet TAKE 1 TABLET BY MOUTH DAILY AS NEEDED WITH FOOD FOR PAIN & INFLAMMATION (Patient taking differently: Take 15 mg by mouth in the morning.)   pantoprazole (PROTONIX) 40 MG tablet TAKE 1 TABLET BY MOUTH EVERY DAY   rosuvastatin (CRESTOR) 40 MG tablet TAKE 1 TABLET BY MOUTH EVERY DAY FOR CHOLESTEROL   telmisartan (MICARDIS) 80 MG tablet TAKE 1 TABLET BY MOUTH EVERY DAY FOR BLOOD PRESSURE (Patient taking differently: Take 80 mg by mouth every evening. FOR BLOOD PRESSURE)   No facility-administered medications prior to visit.    Allergies  Allergen Reactions   Allopurinol Rash    Immunization History  Administered Date(s) Administered   Fluad Quad(high Dose 65+) 10/03/2020   Influenza Split 08/26/2014   Influenza, High Dose Seasonal PF 08/12/2016, 10/17/2018, 09/05/2019, 09/30/2021, 10/14/2022, 08/11/2023   Influenza, Seasonal, Injecte, Preservative Fre 10/27/2015  PFIZER(Purple Top)SARS-COV-2 Vaccination 01/05/2020, 01/26/2020, 09/27/2020   PPD Test 05/23/2014, 07/23/2015   Pneumococcal Conjugate-13 08/26/2014   Pneumococcal Polysaccharide-23 05/05/2010, 01/30/2016   Td 11/30/2003   Tdap 08/12/2010, 08/21/2020, 03/01/2021   Zoster, Live 04/29/2013    Health Maintenance  Topic Date Due   Zoster Vaccines- Shingrix (1 of 2) 01/07/1970   Lung Cancer Screening  04/22/2022   COVID-19 Vaccine (4 - 2024-25 season) 07/31/2023   Medicare Annual Wellness (AWV)  11/09/2024   Colonoscopy  09/06/2026   DTaP/Tdap/Td (5 - Td or Tdap) 03/02/2031   Pneumonia Vaccine 44+ Years old  Completed   INFLUENZA VACCINE  Completed   Hepatitis C Screening  Completed   HPV VACCINES  Aged Out   Fecal DNA (Cologuard)  Discontinued    Patient Care Team: Lucky Cowboy, MD as PCP - General (Internal Medicine) Charlett Nose, [facility] (Inactive) as Pharmacist (Pharmacist)  Review of Systems  {Insert previous labs (optional):23779} {See past labs  Heme  Chem  Endocrine  Serology  Results Review (optional):1}   Objective    There were no vitals taken for this visit. {Insert last BP/Wt (optional):23777}{See vitals history (optional):1}   Physical Exam ***  Depression Screen    11/10/2023   11:34 AM 08/13/2023   10:40 PM 02/01/2023   10:59 PM 02/01/2023   10:58 PM  PHQ 2/9 Scores  PHQ - 2 Score 0 0 0 0   No results found for any visits on 01/27/24.  Assessment & Plan     There are no diagnoses linked to this encounter.   No follow-ups on file.      Alfredia Ferguson, PA-C  [facility]  at [facility] [phone] (phone) [phone] (fax)  [facility]

## 2024-01-27 ENCOUNTER — Encounter: Payer: Self-pay | Admitting: Physician Assistant

## 2024-01-27 ENCOUNTER — Ambulatory Visit (INDEPENDENT_AMBULATORY_CARE_PROVIDER_SITE_OTHER): Payer: Medicare Other | Admitting: Physician Assistant

## 2024-01-27 VITALS — BP 130/81 | HR 50 | Temp 97.8°F | Ht 66.0 in | Wt 153.5 lb

## 2024-01-27 DIAGNOSIS — I10 Essential (primary) hypertension: Secondary | ICD-10-CM

## 2024-01-27 DIAGNOSIS — Z8711 Personal history of peptic ulcer disease: Secondary | ICD-10-CM | POA: Diagnosis not present

## 2024-01-27 DIAGNOSIS — E782 Mixed hyperlipidemia: Secondary | ICD-10-CM | POA: Diagnosis not present

## 2024-01-27 DIAGNOSIS — K7030 Alcoholic cirrhosis of liver without ascites: Secondary | ICD-10-CM | POA: Insufficient documentation

## 2024-01-27 DIAGNOSIS — Z720 Tobacco use: Secondary | ICD-10-CM

## 2024-01-27 DIAGNOSIS — C029 Malignant neoplasm of tongue, unspecified: Secondary | ICD-10-CM | POA: Diagnosis not present

## 2024-01-27 DIAGNOSIS — G4761 Periodic limb movement disorder: Secondary | ICD-10-CM

## 2024-01-27 NOTE — Assessment & Plan Note (Addendum)
 Finished with chemo, radiation therapy. Currently in surveillance phase with periodic scans. Reports difficulty with eating, drinking, and swallowing. Discussed referral to speech therapist for evaluation and management of swallowing difficulties. Stressed that this would help with his QOL and lessen risk of aspiration. -Pt declines referral for now

## 2024-01-27 NOTE — Assessment & Plan Note (Signed)
 Follows with Dr Levora Angel at [facility]. Due for f/u.  Last labs normal. Pt aware of recommendations for alcohol use cessation

## 2024-01-27 NOTE — Assessment & Plan Note (Signed)
 Managed with crestor 40  LDL at goal < 70

## 2024-01-27 NOTE — Assessment & Plan Note (Signed)
 Underwent EGD in February 2024 for evaluation of dysphagia and odynophagia which showed abnormal lesion in the oropharynx, grade 1 esophageal varices, gastritis and gastric ulcers. Prior to malignancy discovery.  Recommending he f/b with GI to establish plan for endoscopy screening.

## 2024-01-27 NOTE — Assessment & Plan Note (Signed)
 Chronic, well controlled. Manages with telmisartan 80 mg, bisoprolol 10 mg  Reviewed cmp F/u 6 mo

## 2024-01-27 NOTE — Assessment & Plan Note (Signed)
 Manages with prn gabapentin and klonopin. Ok to continue prn use

## 2024-01-27 NOTE — Assessment & Plan Note (Addendum)
>   50 pack year history. Due for LDCT screen; but may not need given recent PET. Will refer back to program for their recommendations  Recommending smoking cessation- pt aware and trying to cut down

## 2024-01-30 ENCOUNTER — Other Ambulatory Visit: Payer: Self-pay | Admitting: Gastroenterology

## 2024-01-30 DIAGNOSIS — K7460 Unspecified cirrhosis of liver: Secondary | ICD-10-CM

## 2024-02-07 ENCOUNTER — Encounter: Payer: Medicare Other | Admitting: Internal Medicine

## 2024-02-09 ENCOUNTER — Encounter: Payer: Medicare Other | Admitting: Internal Medicine

## 2024-02-17 ENCOUNTER — Other Ambulatory Visit: Payer: Self-pay | Admitting: Family

## 2024-02-20 ENCOUNTER — Encounter: Payer: Self-pay | Admitting: Adult Health

## 2024-02-20 ENCOUNTER — Telehealth: Payer: Self-pay | Admitting: Adult Health

## 2024-02-20 DIAGNOSIS — G4733 Obstructive sleep apnea (adult) (pediatric): Secondary | ICD-10-CM

## 2024-02-20 NOTE — Telephone Encounter (Signed)
 [facility] Maralyn Sago) faxed over a orderform for CPAP supplies. Could you fax back with recent office notes. Fax: [phone]

## 2024-02-21 NOTE — Telephone Encounter (Signed)
 Did you send an order to me?

## 2024-02-21 NOTE — Telephone Encounter (Signed)
 Order received for supplies.  Needs signature.  Pt last seen 06-15-2023.  Next appt 06-14-2024.

## 2024-02-21 NOTE — Telephone Encounter (Signed)
 I called as not received fax from [facility].  Called and spoke to Diane, I gave her local pod fax #.  [phone].

## 2024-02-21 NOTE — Telephone Encounter (Signed)
 Order from [facility] signed and faxed along with OV.  I did placed order in epic as to have the date.  Order faxed to [facility] [phone]. Confirmation received by fax 10pgs.

## 2024-02-22 ENCOUNTER — Encounter: Payer: Self-pay | Admitting: Physician Assistant

## 2024-02-22 MED ORDER — GABAPENTIN 300 MG PO CAPS: 300.0000 mg | ORAL_CAPSULE | Freq: Two times a day (BID) | ORAL | 0 refills | Status: DC

## 2024-02-22 NOTE — Telephone Encounter (Signed)
 Order signed.  (Also placed order in EPIC as well).  Faxed to [facility] 10pgs, fax confirmation received.

## 2024-02-28 ENCOUNTER — Ambulatory Visit
Admission: RE | Admit: 2024-02-28 | Discharge: 2024-02-28 | Disposition: A | Discharge status: Home / Self Care | Source: Ambulatory Visit | Attending: Gastroenterology

## 2024-02-28 DIAGNOSIS — K7460 Unspecified cirrhosis of liver: Secondary | ICD-10-CM

## 2024-03-06 ENCOUNTER — Other Ambulatory Visit: Payer: Self-pay | Admitting: Physician Assistant

## 2024-03-06 ENCOUNTER — Encounter: Payer: Self-pay | Admitting: Physician Assistant

## 2024-03-06 DIAGNOSIS — G2581 Restless legs syndrome: Secondary | ICD-10-CM

## 2024-03-06 MED ORDER — CLONAZEPAM 1 MG PO TABS: ORAL_TABLET | ORAL | 0 refills | Status: DC

## 2024-03-06 NOTE — Telephone Encounter (Signed)
 Requesting: clonazepam 1mg   Contract: None UDS: None Last Visit: 01/27/24 Next Visit: None Last Refill: 01/11/24 #25 and 0RF   Please Advise

## 2024-03-07 MED ORDER — TELMISARTAN 80 MG PO TABS: 80.0000 mg | ORAL_TABLET | Freq: Every day | ORAL | 0 refills | Status: DC

## 2024-04-27 ENCOUNTER — Other Ambulatory Visit: Payer: Self-pay | Admitting: Physician Assistant

## 2024-05-03 ENCOUNTER — Other Ambulatory Visit: Payer: Self-pay | Admitting: Physician Assistant

## 2024-05-03 ENCOUNTER — Encounter: Payer: Self-pay | Admitting: Physician Assistant

## 2024-05-24 ENCOUNTER — Encounter: Payer: Self-pay | Admitting: Physician Assistant

## 2024-05-24 ENCOUNTER — Other Ambulatory Visit: Payer: Self-pay | Admitting: *Deleted

## 2024-05-24 ENCOUNTER — Other Ambulatory Visit: Payer: Self-pay | Admitting: Physician Assistant

## 2024-05-24 DIAGNOSIS — G2581 Restless legs syndrome: Secondary | ICD-10-CM

## 2024-05-24 MED ORDER — ROSUVASTATIN CALCIUM 40 MG PO TABS: ORAL_TABLET | ORAL | 3 refills | Status: AC

## 2024-05-24 NOTE — Telephone Encounter (Signed)
 Requesting: clonazepam  1mg   Contract: None UDS: None Last Visit: 01/27/24 Next Visit: None Last Refill: 03/06/24 #25 and 0RF  Please Advise

## 2024-06-14 ENCOUNTER — Encounter: Payer: Self-pay | Admitting: Adult Health

## 2024-06-14 ENCOUNTER — Ambulatory Visit: Payer: Medicare Other | Admitting: Adult Health

## 2024-06-14 VITALS — BP 138/67 | HR 52 | Ht 66.0 in | Wt 150.0 lb

## 2024-06-14 DIAGNOSIS — G4733 Obstructive sleep apnea (adult) (pediatric): Secondary | ICD-10-CM | POA: Diagnosis not present

## 2024-06-14 NOTE — Patient Instructions (Signed)
 Continue using CPAP nightly and greater than 4 hours each night If your symptoms worsen or you develop new symptoms please let us know.

## 2024-06-14 NOTE — Progress Notes (Signed)
 PATIENT: Stephen Holland DOB: 07-17-1951  REASON FOR VISIT: follow up HISTORY FROM: patient  Chief Complaint  Patient presents with   Follow-up    Pt in 4 alone Pt here for cpap f/u  Pt states no questions or concerns for today's visit     HISTORY OF PRESENT ILLNESS: Today 06/14/24:  Stephen Holland is a 73 y.o. male with a history of OSA on CPAP. Returns today for follow-up.  Overall he reports that CPAP is working well for him.  He denies any new issues.  Reports a diagnosis of COPD but not on any current medications.  His download is below       06/15/23: Stephen Holland is a 73 y.o. male with a history of obstructive sleep apnea on CPAP. Returns today for follow-up.  Download is below.  Overall he feels that the CPAP is working well.  Denies any new issues.      REVIEW OF SYSTEMS: Out of a complete 14 system review of symptoms, the patient complains only of the following symptoms, and all other reviewed systems are negative.   ESS 1  ALLERGIES: Allergies  Allergen Reactions   Allopurinol  Rash    HOME MEDICATIONS: Outpatient Medications Prior to Visit  Medication Sig Dispense Refill   bisoprolol  (ZEBETA ) 10 MG tablet TAKE 1 TABLET BY MOUTH EVERY DAY FOR BLOOD PRESSURE 90 tablet 1   Cholecalciferol (VITAMIN D3) 125 MCG (5000 UT) CAPS Take 10,000 Units by mouth See admin instructions. Take tues., thurs., Sat., Sun.     clonazePAM  (KLONOPIN ) 1 MG tablet 1/2- 1 TAB AT BEDTIME FOR RESTLESS LEGS AS NEEDED 25 tablet 0   gabapentin  (NEURONTIN ) 300 MG capsule Take 1 capsule (300 mg total) by mouth 2 (two) times daily. 180 capsule 0   KRILL OIL PO Take 750 mg by mouth every morning. Mega Red     MAGNESIUM PO Take 500 mg by mouth daily.     pantoprazole  (PROTONIX ) 40 MG tablet TAKE 1 TABLET BY MOUTH EVERY DAY 90 tablet 1   rosuvastatin  (CRESTOR ) 40 MG tablet TAKE 1 TABLET BY MOUTH EVERY DAY FOR CHOLESTEROL 90 tablet 3   telmisartan  (MICARDIS ) 80 MG tablet Take 1 tablet (80  mg total) by mouth daily. 90 tablet 0   meloxicam  (MOBIC ) 15 MG tablet TAKE 1 TABLET BY MOUTH DAILY AS NEEDED WITH FOOD FOR PAIN & INFLAMMATION (Patient taking differently: Take 15 mg by mouth in the morning.) 90 tablet 3   No facility-administered medications prior to visit.    PAST MEDICAL HISTORY: Past Medical History:  Diagnosis Date   AKI (acute kidney injury) (HCC) 03/01/2021   Anemia    RBC low during chemo treatment   Cataract    removed 2023   CKD (chronic kidney disease) stage 3, GFR 30-59 ml/min (HCC) 02/02/2023   stage 3a - GFR 48   Colon polyps    DJD (degenerative joint disease)    Dyspnea    with exertion   ED (erectile dysfunction)    Emphysema of lung (HCC)    GERD (gastroesophageal reflux disease)    Gout    Hyperlipemia    Hypertension    Obstructive sleep apnea    08/2012, cpap setting of 14   Prostate cancer (HCC) 01/2002   s/p prostatectomy   Sleep apnea    Ulcer    diagnosed 2024   Vitamin D  deficiency     PAST SURGICAL HISTORY: Past Surgical History:  Procedure Laterality  Date   CATARACT EXTRACTION, BILATERAL Bilateral 12/2020   Dr. Milan   COLONOSCOPY WITH PROPOFOL  N/A 09/06/2016   Procedure: COLONOSCOPY WITH PROPOFOL ;  Surgeon: Gladis MARLA Louder, MD;  Location: [facility];  Service: Endoscopy;  Laterality: N/A;   colonscopy     x 3 with polyp one time   DIRECT LARYNGOSCOPY N/A 03/24/2023   Procedure: DIRECT LARYNGOSCOPY WITH BIOPSIES;  Surgeon: Luciano Standing, MD;  Location: [facility] OR;  Service: ENT;  Laterality: N/A;   EYE SURGERY     cataract 2023   PROSTATECTOMY  2003   SKIN CANCER EXCISION      FAMILY HISTORY: Family History  Problem Relation Age of Onset   Hypertension Mother    Pulmonary fibrosis Mother    Early death Mother    Stroke Father    Early death Father    Hearing loss Father    Heart disease Father    Hypertension Father    Obesity Father    Heart attack Maternal Grandmother    Stroke Maternal Grandfather     Cancer Maternal Grandfather    Early death Maternal Grandfather    Stroke Paternal Grandmother    Colon cancer Paternal Grandfather    Early death Paternal Grandfather    Stroke Paternal Grandfather    Sleep apnea Neg Hx     SOCIAL HISTORY: Social History   Socioeconomic History   Marital status: Married    Spouse name: Channing   Number of children: 2   Years of education: 12   Highest education level: 12th grade  Occupational History    Comment: truck driver  Tobacco Use   Smoking status: Every Day    Current packs/day: 1.00    Average packs/day: 1 pack/day for 55.0 years (55.0 ttl pk-yrs)    Types: Cigarettes   Smokeless tobacco: Never   Tobacco comments:    Have quit several times and restarted  Vaping Use   Vaping status: Never Used  Substance and Sexual Activity   Alcohol use: Yes    Alcohol/week: 14.0 standard drinks of alcohol    Types: 14 Shots of liquor per week    Comment: 03/23/23:  1-2 mixed drinks per day   Drug use: No   Sexual activity: Not Currently  Other Topics Concern   Not on file  Social History Narrative   Lives home with wife. Retired.  Education HS.  Children 2     Caffeine 6 cups coffee daily, 2-3 glasses of tea and one coke per day   Social Drivers of Health   Financial Resource Strain: Low Risk  (01/20/2024)   Overall Financial Resource Strain (CARDIA)    Difficulty of Paying Living Expenses: Not hard at all  Food Insecurity: No Food Insecurity (01/20/2024)   Hunger Vital Sign    Worried About Running Out of Food in the Last Year: Never true    Ran Out of Food in the Last Year: Never true  Transportation Needs: No Transportation Needs (01/20/2024)   PRAPARE - Administrator, Civil Service (Medical): No    Lack of Transportation (Non-Medical): No  Physical Activity: Unknown (01/20/2024)   Exercise Vital Sign    Days of Exercise per Week: 0 days    Minutes of Exercise per Session: Not on file  Stress: No Stress Concern Present  (01/20/2024)   [employer] of Occupational Health - Occupational Stress Questionnaire    Feeling of Stress : Not at all  Social Connections: Moderately Integrated (01/20/2024)  Social Connection and Isolation Panel    Frequency of Communication with Friends and Family: More than three times a week    Frequency of Social Gatherings with Friends and Family: Once a week    Attends Religious Services: More than 4 times per year    Active Member of [employer] or Organizations: No    Attends Engineer, structural: Not on file    Marital Status: Married  Intimate Partner Violence: Not on file      PHYSICAL EXAM  Vitals:   06/14/24 1040  BP: 138/67  Pulse: (!) 52  Weight: 150 lb (68 kg)  Height: 5' 6 (1.676 m)    Body mass index is 24.21 kg/m.  Generalized: Well developed, in no acute distress  Chest: Lungs clear to auscultation bilaterally  Neurological examination  Mentation: Alert oriented to time, place, history taking. Follows all commands speech and language fluent Cranial nerve II-XII: Facial symmetry noted  DIAGNOSTIC DATA (LABS, IMAGING, TESTING) - I reviewed patient records, labs, notes, testing and imaging myself where available.  Lab Results  Component Value Date   WBC 7.1 11/10/2023   HGB 13.5 11/10/2023   HCT 40.6 11/10/2023   MCV 98.5 11/10/2023   PLT 144 11/10/2023      Component Value Date/Time   NA 143 11/10/2023 1123   NA 139 05/08/2020 0922   K 5.2 11/10/2023 1123   CL 107 11/10/2023 1123   CO2 30 11/10/2023 1123   GLUCOSE 67 11/10/2023 1123   BUN 30 (H) 11/10/2023 1123   BUN 29 (H) 05/08/2020 0922   CREATININE 1.52 (H) 11/10/2023 1123   CALCIUM  9.6 11/10/2023 1123   PROT 6.3 11/10/2023 1123   ALBUMIN 4.1 06/13/2017 1004   AST 26 11/10/2023 1123   ALT 20 11/10/2023 1123   ALKPHOS 58 06/13/2017 1004   BILITOT 0.5 11/10/2023 1123   GFRNONAA 55 (L) 03/24/2023 1250   GFRNONAA 44 (L) 03/25/2021 1123   GFRAA 51 (L) 03/25/2021 1123    Lab Results  Component Value Date   CHOL 121 11/10/2023   HDL 50 11/10/2023   LDLCALC 58 11/10/2023   TRIG 57 11/10/2023   CHOLHDL 2.4 11/10/2023   Lab Results  Component Value Date   HGBA1C 5.3 11/10/2023   Lab Results  Component Value Date   VITAMINB12 517 08/21/2020   Lab Results  Component Value Date   TSH 0.84 08/11/2023      ASSESSMENT AND PLAN 73 y.o. year old male  has a past medical history of AKI (acute kidney injury) (HCC) (03/01/2021), Anemia, Cataract, CKD (chronic kidney disease) stage 3, GFR 30-59 ml/min (HCC) (02/02/2023), Colon polyps, DJD (degenerative joint disease), Dyspnea, ED (erectile dysfunction), Emphysema of lung (HCC), GERD (gastroesophageal reflux disease), Gout, Hyperlipemia, Hypertension, Obstructive sleep apnea, Prostate cancer (HCC) (01/2002), Sleep apnea, Ulcer, and Vitamin D  deficiency. here with:  OSA on CPAP  - CPAP compliance excellent - Good treatment of AHI  - Encourage patient to use CPAP nightly and > 4 hours each night - F/U in 1 year or sooner if needed    Duwaine Russell, MSN, NP-C 06/14/2024, 10:41 AM [facility] [address] [place], [place] [address] [phone]  The patient's condition requires frequent monitoring and adjustments in the treatment plan, reflecting the ongoing complexity of care.  This provider is the continuing focal point for all needed services for this condition.

## 2024-07-10 ENCOUNTER — Encounter: Payer: Self-pay | Admitting: Physician Assistant

## 2024-07-10 DIAGNOSIS — G2581 Restless legs syndrome: Secondary | ICD-10-CM

## 2024-07-10 MED ORDER — CLONAZEPAM 1 MG PO TABS: ORAL_TABLET | ORAL | 0 refills | Status: AC

## 2024-07-10 NOTE — Telephone Encounter (Signed)
 Requesting: klonopin   Contract:05/04/24 UDS: NA Last Visit:01/27/24 Next Visit: NA Last Refill:05/24/24  Please Advise

## 2024-11-12 ENCOUNTER — Ambulatory Visit: Payer: Medicare Other | Admitting: Nurse Practitioner

## 2025-06-20 ENCOUNTER — Ambulatory Visit: Admitting: Adult Health
